# Patient Record
Sex: Male | Born: 1965
Health system: Southern US, Community
[De-identification: ages and names within clinical notes are randomized; demographics above are authoritative.]

## PROBLEM LIST (undated history)

## (undated) DIAGNOSIS — K56609 Unspecified intestinal obstruction, unspecified as to partial versus complete obstruction: Secondary | ICD-10-CM

## (undated) DIAGNOSIS — Z87442 Personal history of urinary calculi: Secondary | ICD-10-CM

## (undated) DIAGNOSIS — K219 Gastro-esophageal reflux disease without esophagitis: Secondary | ICD-10-CM

## (undated) DIAGNOSIS — C801 Malignant (primary) neoplasm, unspecified: Secondary | ICD-10-CM

## (undated) HISTORY — PX: HERNIA REPAIR: SHX51

---

## 2016-02-19 DIAGNOSIS — M5440 Lumbago with sciatica, unspecified side: Secondary | ICD-10-CM | POA: Diagnosis not present

## 2016-02-19 DIAGNOSIS — M545 Low back pain: Secondary | ICD-10-CM | POA: Diagnosis not present

## 2017-04-05 DIAGNOSIS — M25562 Pain in left knee: Secondary | ICD-10-CM | POA: Diagnosis not present

## 2017-04-06 DIAGNOSIS — M25562 Pain in left knee: Secondary | ICD-10-CM | POA: Diagnosis not present

## 2017-04-06 DIAGNOSIS — M25462 Effusion, left knee: Secondary | ICD-10-CM | POA: Diagnosis not present

## 2017-04-19 DIAGNOSIS — Z0000 Encounter for general adult medical examination without abnormal findings: Secondary | ICD-10-CM | POA: Diagnosis not present

## 2017-04-19 DIAGNOSIS — Z6841 Body Mass Index (BMI) 40.0 and over, adult: Secondary | ICD-10-CM | POA: Diagnosis not present

## 2017-04-26 DIAGNOSIS — M25562 Pain in left knee: Secondary | ICD-10-CM | POA: Diagnosis not present

## 2017-05-10 DIAGNOSIS — M25562 Pain in left knee: Secondary | ICD-10-CM | POA: Diagnosis not present

## 2017-05-31 DIAGNOSIS — S83242A Other tear of medial meniscus, current injury, left knee, initial encounter: Secondary | ICD-10-CM | POA: Diagnosis not present

## 2017-06-15 ENCOUNTER — Other Ambulatory Visit: Payer: Self-pay | Admitting: Orthopedic Surgery

## 2017-06-21 ENCOUNTER — Encounter (HOSPITAL_COMMUNITY): Payer: Self-pay

## 2017-06-21 ENCOUNTER — Encounter (HOSPITAL_COMMUNITY)
Admission: RE | Admit: 2017-06-21 | Discharge: 2017-06-21 | Disposition: A | Discharge status: Home / Self Care | Payer: BLUE CROSS/BLUE SHIELD | Source: Ambulatory Visit | Attending: Orthopedic Surgery | Admitting: Orthopedic Surgery

## 2017-06-21 ENCOUNTER — Other Ambulatory Visit: Payer: Self-pay | Admitting: Orthopedic Surgery

## 2017-06-21 DIAGNOSIS — X58XXXA Exposure to other specified factors, initial encounter: Secondary | ICD-10-CM | POA: Insufficient documentation

## 2017-06-21 DIAGNOSIS — S83242A Other tear of medial meniscus, current injury, left knee, initial encounter: Secondary | ICD-10-CM | POA: Insufficient documentation

## 2017-06-21 HISTORY — DX: Unspecified intestinal obstruction, unspecified as to partial versus complete obstruction: K56.609

## 2017-06-21 HISTORY — DX: Malignant (primary) neoplasm, unspecified: C80.1

## 2017-06-21 HISTORY — DX: Personal history of urinary calculi: Z87.442

## 2017-06-21 LAB — CBC
HEMATOCRIT: 48 % (ref 39.0–52.0)
Hemoglobin: 16.6 g/dL (ref 13.0–17.0)
MCH: 32 pg (ref 26.0–34.0)
MCHC: 34.6 g/dL (ref 30.0–36.0)
MCV: 92.5 fL (ref 78.0–100.0)
Platelets: 241 10*3/uL (ref 150–400)
RBC: 5.19 MIL/uL (ref 4.22–5.81)
RDW: 12.9 % (ref 11.5–15.5)
WBC: 7.9 10*3/uL (ref 4.0–10.5)

## 2017-06-21 NOTE — Progress Notes (Addendum)
Mr Alkhatib is allergic to Penicillin, it causes swelling all over.  I called Lacretia Nicks at Dr Berenice Primas office and informed her, she said they will chang the antibiotic. Patient debies chest pain shortness of breath.Patient's PCP is Dr Darrol Jump, [place], [place].

## 2017-06-21 NOTE — Pre-Procedure Instructions (Signed)
Thomas Gonzalez  06/21/2017    Your procedure is scheduled on Wednesday, August 8.  Report to [facility] Admitting at 12:45 PM                 Your surgery or procedure is scheduled for 2:45 PM   Call this number if you have problems the morning of surgery- [phone]- pre op desk                   For any other questions, please call [phone], Monday - Friday 8 AM - 4 PM. Ask to speak to a nurse.    Remember:  Do not eat food or drink liquids after midnight Tuesday, August 7.  Take these medicines the morning of surgery with A SIP OF WATER - meloxicam (MOBIC) .  Special instructions:   - Preparing For Surgery  Before surgery, you can play an important role. Because skin is not sterile, your skin needs to be as free of germs as possible. You can reduce the number of germs on your skin by washing with CHG (chlorahexidine gluconate) Soap before surgery.  CHG is an antiseptic cleaner which kills germs and bonds with the skin to continue killing germs even after washing.  Please do not use if you have an allergy to CHG or antibacterial soaps. If your skin becomes reddened/irritated stop using the CHG.  Do not shave (including legs and underarms) for at least 48 hours prior to first CHG shower. It is OK to shave your face.  Please follow these instructions carefully.   1. Shower the NIGHT BEFORE SURGERY and the MORNING OF SURGERY with CHG.   2. If you chose to wash your hair, wash your hair first as usual with your normal shampoo.  3. After you shampoo, rinse your hair and body thoroughly to remove the shampoo. Wash your face and private area with the soap you use at home, then rinse.  4. Use CHG as you would any other liquid soap. You can apply CHG directly to the skin and wash gently with a scrungie or a clean washcloth.   5. Apply the CHG Soap to your body ONLY FROM THE NECK DOWN.  Do not use on open wounds or open sores. Avoid contact with your eyes,  ears, mouth and genitals (private parts). Wash genitals (private parts) with your normal soap.  6. Wash thoroughly, paying special attention to the area where your surgery will be performed.  7. Thoroughly rinse your body with warm water from the neck down.  8. DO NOT shower/wash with your normal soap after using and rinsing off the CHG Soap.  9. Pat yourself dry with a CLEAN TOWEL.   10. Wear CLEAN PAJAMAS   11. Place CLEAN SHEETS on your bed the night of your first shower and DO NOT SLEEP WITH PETS.  Day of Surgery:Shower as Above Do not apply any deodorants/lotions. Please wear clean clothes to the hospital/surgery center.    Do not wear jewelry, make-up or nail polish.  Do not wear lotions, powders, or perfumes, or deoderant.  Do not shave 48 hours prior to surgery.  Men may shave face and neck.  Do not bring valuables to the hospital.  [facility] is not responsible for any belongings or valuables.  Contacts, dentures or bridgework may not be worn into surgery.  Leave your suitcase in the car.  After surgery it may be brought to your room.  For  patients admitted to the hospital, discharge time will be determined by your treatment team.  Patients discharged the day of surgery will not be allowed to drive home.   Please read over the following fact sheets that you were given: Pain Booklet,  Incentive Spirometry, Surgical Site Infections.

## 2017-06-26 MED ORDER — CLINDAMYCIN PHOSPHATE 900 MG/50ML IV SOLN
900.0000 mg | INTRAVENOUS | Status: DC
  Filled 2017-06-26: qty 50

## 2017-06-27 ENCOUNTER — Ambulatory Visit (HOSPITAL_COMMUNITY): Payer: BLUE CROSS/BLUE SHIELD | Admitting: Certified Registered Nurse Anesthetist

## 2017-06-27 ENCOUNTER — Encounter (HOSPITAL_COMMUNITY)
Admission: RE | Disposition: A | Discharge status: Home / Self Care | Payer: Self-pay | Source: Ambulatory Visit | Attending: Orthopedic Surgery

## 2017-06-27 ENCOUNTER — Ambulatory Visit (HOSPITAL_COMMUNITY)
Admission: RE | Admit: 2017-06-27 | Discharge: 2017-06-27 | Disposition: A | Discharge status: Home / Self Care | Payer: BLUE CROSS/BLUE SHIELD | Source: Ambulatory Visit | Attending: Orthopedic Surgery | Admitting: Orthopedic Surgery

## 2017-06-27 ENCOUNTER — Encounter (HOSPITAL_COMMUNITY): Payer: Self-pay | Admitting: Certified Registered Nurse Anesthetist

## 2017-06-27 DIAGNOSIS — Z880 Allergy status to penicillin: Secondary | ICD-10-CM | POA: Diagnosis not present

## 2017-06-27 DIAGNOSIS — Z87891 Personal history of nicotine dependence: Secondary | ICD-10-CM | POA: Diagnosis not present

## 2017-06-27 DIAGNOSIS — Z87442 Personal history of urinary calculi: Secondary | ICD-10-CM | POA: Insufficient documentation

## 2017-06-27 DIAGNOSIS — M6752 Plica syndrome, left knee: Secondary | ICD-10-CM | POA: Diagnosis not present

## 2017-06-27 DIAGNOSIS — M23322 Other meniscus derangements, posterior horn of medial meniscus, left knee: Secondary | ICD-10-CM | POA: Diagnosis not present

## 2017-06-27 DIAGNOSIS — X58XXXA Exposure to other specified factors, initial encounter: Secondary | ICD-10-CM | POA: Diagnosis not present

## 2017-06-27 DIAGNOSIS — M2242 Chondromalacia patellae, left knee: Secondary | ICD-10-CM | POA: Diagnosis not present

## 2017-06-27 DIAGNOSIS — M94262 Chondromalacia, left knee: Secondary | ICD-10-CM | POA: Insufficient documentation

## 2017-06-27 DIAGNOSIS — S83242A Other tear of medial meniscus, current injury, left knee, initial encounter: Secondary | ICD-10-CM | POA: Insufficient documentation

## 2017-06-27 HISTORY — PX: KNEE ARTHROSCOPY: SHX127

## 2017-06-27 SURGERY — ARTHROSCOPY, KNEE
Anesthesia: General | Site: Knee | Laterality: Left

## 2017-06-27 MED ORDER — LACTATED RINGERS IV SOLN
INTRAVENOUS | Status: DC
  Administered 2017-06-27: 50 mL/h via INTRAVENOUS

## 2017-06-27 MED ORDER — PROPOFOL 10 MG/ML IV BOLUS
INTRAVENOUS | Status: AC
  Filled 2017-06-27: qty 20

## 2017-06-27 MED ORDER — MIDAZOLAM HCL 2 MG/2ML IJ SOLN
INTRAMUSCULAR | Status: AC
  Filled 2017-06-27: qty 2

## 2017-06-27 MED ORDER — DEXAMETHASONE SODIUM PHOSPHATE 10 MG/ML IJ SOLN
INTRAMUSCULAR | Status: AC
  Filled 2017-06-27: qty 1

## 2017-06-27 MED ORDER — SODIUM CHLORIDE 0.9 % IR SOLN
Status: DC | PRN
  Administered 2017-06-27: 3000 mL

## 2017-06-27 MED ORDER — LIDOCAINE 2% (20 MG/ML) 5 ML SYRINGE
INTRAMUSCULAR | Status: AC
  Filled 2017-06-27: qty 5

## 2017-06-27 MED ORDER — BUPIVACAINE HCL (PF) 0.5 % IJ SOLN
INTRAMUSCULAR | Status: AC
  Filled 2017-06-27: qty 30

## 2017-06-27 MED ORDER — BUPIVACAINE HCL (PF) 0.25 % IJ SOLN
INTRAMUSCULAR | Status: DC | PRN
  Administered 2017-06-27: 20 mL

## 2017-06-27 MED ORDER — EPINEPHRINE PF 1 MG/ML IJ SOLN
INTRAMUSCULAR | Status: DC | PRN
  Administered 2017-06-27: 1 mg

## 2017-06-27 MED ORDER — FENTANYL CITRATE (PF) 250 MCG/5ML IJ SOLN
INTRAMUSCULAR | Status: AC
  Filled 2017-06-27: qty 5

## 2017-06-27 MED ORDER — ONDANSETRON HCL 4 MG/2ML IJ SOLN
INTRAMUSCULAR | Status: AC
  Filled 2017-06-27: qty 2

## 2017-06-27 MED ORDER — EPINEPHRINE PF 1 MG/ML IJ SOLN
INTRAMUSCULAR | Status: AC
  Filled 2017-06-27: qty 1

## 2017-06-27 SURGICAL SUPPLY — 40 items
BANDAGE ACE 6X5 VEL STRL LF (GAUZE/BANDAGES/DRESSINGS) ×3 IMPLANT
BLADE CUDA 5.5 (BLADE) IMPLANT
BLADE CUTTER GATOR 3.5 (BLADE) IMPLANT
BLADE GREAT WHITE 4.2 (BLADE) ×2 IMPLANT
BLADE GREAT WHITE 4.2MM (BLADE) ×1
BNDG GAUZE ELAST 4 BULKY (GAUZE/BANDAGES/DRESSINGS) ×3 IMPLANT
COVER SURGICAL LIGHT HANDLE (MISCELLANEOUS) ×3 IMPLANT
CUFF TOURNIQUET SINGLE 34IN LL (TOURNIQUET CUFF) IMPLANT
CUFF TOURNIQUET SINGLE 44IN (TOURNIQUET CUFF) IMPLANT
DRAPE ARTHROSCOPY W/POUCH 114 (DRAPES) ×3 IMPLANT
DRAPE HALF SHEET 40X57 (DRAPES) ×3 IMPLANT
DRAPE U-SHAPE 47X51 STRL (DRAPES) IMPLANT
DRSG EMULSION OIL 3X3 NADH (GAUZE/BANDAGES/DRESSINGS) ×3 IMPLANT
DRSG PAD ABDOMINAL 8X10 ST (GAUZE/BANDAGES/DRESSINGS) ×3 IMPLANT
DURAPREP 26ML APPLICATOR (WOUND CARE) ×3 IMPLANT
FILTER STRAW FLUID ASPIR (MISCELLANEOUS) ×3 IMPLANT
GAUZE SPONGE 4X4 12PLY STRL (GAUZE/BANDAGES/DRESSINGS) ×3 IMPLANT
GLOVE BIOGEL PI IND STRL 8 (GLOVE) ×2 IMPLANT
GLOVE BIOGEL PI INDICATOR 8 (GLOVE) ×4
GLOVE ECLIPSE 7.5 STRL STRAW (GLOVE) ×6 IMPLANT
GOWN STRL REUS W/ TWL LRG LVL3 (GOWN DISPOSABLE) ×1 IMPLANT
GOWN STRL REUS W/ TWL XL LVL3 (GOWN DISPOSABLE) ×2 IMPLANT
GOWN STRL REUS W/TWL LRG LVL3 (GOWN DISPOSABLE) ×2
GOWN STRL REUS W/TWL XL LVL3 (GOWN DISPOSABLE) ×4
KIT BASIN OR (CUSTOM PROCEDURE TRAY) ×3 IMPLANT
KIT ROOM TURNOVER OR (KITS) ×3 IMPLANT
NEEDLE 18GX1X1/2 (RX/OR ONLY) (NEEDLE) ×3 IMPLANT
PACK ARTHROSCOPY DSU (CUSTOM PROCEDURE TRAY) ×3 IMPLANT
PAD ABD 8X10 STRL (GAUZE/BANDAGES/DRESSINGS) ×3 IMPLANT
PAD ARMBOARD 7.5X6 YLW CONV (MISCELLANEOUS) ×6 IMPLANT
PAD CAST 4YDX4 CTTN HI CHSV (CAST SUPPLIES) ×2 IMPLANT
PADDING CAST COTTON 4X4 STRL (CAST SUPPLIES) ×4
SET ARTHROSCOPY TUBING (MISCELLANEOUS) ×2
SET ARTHROSCOPY TUBING LN (MISCELLANEOUS) ×1 IMPLANT
SPONGE LAP 4X18 X RAY DECT (DISPOSABLE) ×3 IMPLANT
SUT ETHILON 4 0 PS 2 18 (SUTURE) ×3 IMPLANT
SYR 5ML LL (SYRINGE) ×3 IMPLANT
TOWEL OR 17X24 6PK STRL BLUE (TOWEL DISPOSABLE) ×3 IMPLANT
TOWEL OR 17X26 10 PK STRL BLUE (TOWEL DISPOSABLE) ×3 IMPLANT
WRAP KNEE MAXI GEL POST OP (GAUZE/BANDAGES/DRESSINGS) ×3 IMPLANT

## 2017-06-27 NOTE — H&P (Signed)
PREOPERATIVE H&P  Chief Complaint: l knee pain  HPI: Thomas Gonzalez is a 51 y.o. male who presents for evaluation of l knee pain. It has been present for greater than 3 months and has been worsening. He has failed conservative measures. Pain is rated as moderate.  Past Medical History:  Diagnosis Date  . Bowel obstruction ([place])    06/21/2017 reported- "years ago"  . Cancer ([place])    basal cell face  . History of kidney stones    x 2   Past Surgical History:  Procedure Laterality Date  . HERNIA REPAIR Right    Inguinial    Social History   Social History  . Marital status: Married    Spouse name: N/A  . Number of children: N/A  . Years of education: N/A   Social History Main Topics  . Smoking status: Former Smoker    Years: 0.20  . Smokeless tobacco: Former Systems developer     Comment: quit in 1985  . Alcohol use No     Comment: in high school- not since  . Drug use: No  . Sexual activity: Not Asked   Other Topics Concern  . None   Social History Narrative  . None   History reviewed. No pertinent family history. Allergies  Allergen Reactions  . Penicillins Anaphylaxis   Prior to Admission medications   Medication Sig Start Date End Date Taking? Authorizing Provider  calcium carbonate (TUMS - DOSED IN MG ELEMENTAL CALCIUM) 500 MG chewable tablet Chew 1 tablet by mouth daily as needed for indigestion or heartburn.   Yes [provider]  meloxicam (MOBIC) 15 MG tablet Take 15 mg by mouth daily as needed for pain.   Yes [provider]     Positive ROS: none  All other systems have been reviewed and were otherwise negative with the exception of those mentioned in the HPI and as above.  Physical Exam: Vitals:   06/27/17 1743 06/27/17 1745  BP: 118/84   Pulse: 61 60  Resp: 11 12  Temp:  (!) 97.2 F (36.2 C)    General: Alert, no acute distress Cardiovascular: No pedal edema Respiratory: No cyanosis, no use of accessory musculature GI: No  organomegaly, abdomen is soft and non-tender Skin: No lesions in the area of chief complaint Neurologic: Sensation intact distally Psychiatric: Patient is competent for consent with normal mood and affect Lymphatic: No axillary or cervical lymphadenopathy  MUSCULOSKELETAL: l knee: painful ROM +ttp over med side trace effusion  Assessment/Plan: LEFT KNEE MEDIAL MENISCUS TEAR Plan for Procedure(s): ARTHROSCOPY KNEE  The risks benefits and alternatives were discussed with the patient including but not limited to the risks of nonoperative treatment, versus surgical intervention including infection, bleeding, nerve injury, malunion, nonunion, hardware prominence, hardware failure, need for hardware removal, blood clots, cardiopulmonary complications, morbidity, mortality, among others, and they were willing to proceed.  Predicted outcome is good, although there will be at least a six to nine month expected recovery.  Alta Corning, MD 06/27/2017 8:59 PM

## 2017-06-27 NOTE — Brief Op Note (Signed)
06/27/2017  9:00 PM  PATIENT:  Elvina Mattes  51 y.o. male  PRE-OPERATIVE DIAGNOSIS:  LEFT KNEE MEDIAL MENISCUS TEAR  POST-OPERATIVE DIAGNOSIS:  LEFT KNEE MEDIAL MENISCUS TEAR  PROCEDURE:  Procedure(s): ARTHROSCOPY KNEE (Left)  SURGEON:  Surgeon(s) and Role:    Dorna Leitz, MD - Primary  PHYSICIAN ASSISTANT:   ASSISTANTS: bethune   ANESTHESIA:   general  EBL:  No intake/output data recorded.  BLOOD ADMINISTERED:none  DRAINS: none   LOCAL MEDICATIONS USED:  MARCAINE     SPECIMEN:  No Specimen  DISPOSITION OF SPECIMEN:  N/A  COUNTS:  YES  TOURNIQUET:  * No tourniquets in log *  DICTATION: .Other Dictation: Dictation Number [id number]  PLAN OF CARE: Discharge to home after PACU  PATIENT DISPOSITION:  PACU - hemodynamically stable.   Delay start of Pharmacological VTE agent (>24hrs) due to surgical blood loss or risk of bleeding: no

## 2017-06-27 NOTE — Progress Notes (Signed)
Orthopedic Tech Progress Note Patient Details:  Thomas Gonzalez 05-07-1966 [id number]  Ortho Devices Type of Ortho Device: Crutches Ortho Device/Splint Location: fitted and supplied pt with crutches for Left knee.  Pt ambulated fair after training.  Left knee.  Ortho Device/Splint Interventions: Adjustment   Kristopher Oppenheim 06/27/2017, 6:44 PM

## 2017-06-27 NOTE — Transfer of Care (Signed)
Immediate Anesthesia Transfer of Care Note  Patient: Thomas Gonzalez  Procedure(s) Performed: Procedure(s): ARTHROSCOPY KNEE (Left)  Patient Location: PACU  Anesthesia Type:General  Level of Consciousness: drowsy  Airway & Oxygen Therapy: Patient Spontanous Breathing  Post-op Assessment: Report given to RN and Post -op Vital signs reviewed and stable  Post vital signs: Reviewed and stable  Last Vitals:  Vitals:   06/27/17 1724 06/27/17 1728  BP:  121/80  Pulse: 66 61  Resp: 18 (!) 9  Temp:      Last Pain:  Vitals:   06/27/17 1715  TempSrc:   PainSc: 0-No pain      Patients Stated Pain Goal: 3 (19/50/93 2671)  Complications: No apparent anesthesia complications

## 2017-06-28 ENCOUNTER — Encounter (HOSPITAL_COMMUNITY): Payer: Self-pay | Admitting: Orthopedic Surgery

## 2017-06-28 NOTE — Op Note (Signed)
NAME:  Dasch, Danel                   ACCOUNT NO.:  MEDICAL RECORD NO.:  [id number]  LOCATION:                                 FACILITY:  PHYSICIAN:  Alta Corning, M.D.        DATE OF BIRTH:  DATE OF PROCEDURE:  06/27/2017 DATE OF DISCHARGE:                              OPERATIVE REPORT   POSTOPERATIVE DIAGNOSIS:  Medial meniscal tear.  POSTOPERATIVE DIAGNOSES: 1. Medial meniscal tear. 2. Chondromalacia of patellofemoral joint. 3. Medial shelf plica.  PROCEDURES: 1. Partial medial meniscectomy. 2. Chondroplasty of patellofemoral joint down to bleeding bone where     necessary. 3. Medial shelf plica excision.  SURGEON:  Alta Corning, MD.  ASSISTANT:  Gary Fleet, PA.  ANESTHESIA:  General.  BRIEF HISTORY:  Mr. Busic is a 51 year old male with a history and significant complaints of left knee pain.  He had been treated conservatively for prolonged period of time and after failure of conservative care, MRI was obtained which showed medial meniscal tear, complex in nature with chondromalacia of the patellofemoral joint.  He was brought to the operating room for evaluation and procedure as needed.  DESCRIPTION OF PROCEDURE:  The patient was taken to the operating room and after adequate anesthesia was obtained with general anesthetic he was placed supine on the operating table.  The left leg was prepped and draped in usual sterile fashion.  Following this, routine arthroscopic examination of the knee revealed there was obvious chondromalacia of the patellofemoral joint.  This was debrided back to a smooth and stable rim and down to bleeding bone where necessary.  Attention was turned to the medial compartment.  Again, in the medial compartment, we had debrided a large medial shelf plica back to the rim.  Once this was done, we got in the medial compartment.  There was a complex posterior horn medial meniscal tear with flaps both anteriorly and posteriorly.  We  were able to identify and debride the flaps in the anterior and posterior direction.  Once this was done, the knee was irrigated, suctioned dry and a sterile compressive dressing was applied.  After 20 mL 0.25% Marcaine was instilled for postoperative anesthesia, the patient was taken to the recovery room and was noted to be in satisfactory condition.  Estimated blood loss for the procedure was minimal.     Alta Corning, M.D.     Corliss Skains  D:  06/27/2017  T:  06/27/2017  Job:  [id number]

## 2017-07-03 DIAGNOSIS — Z9889 Other specified postprocedural states: Secondary | ICD-10-CM | POA: Diagnosis not present

## 2017-07-04 DIAGNOSIS — Z4789 Encounter for other orthopedic aftercare: Secondary | ICD-10-CM | POA: Diagnosis not present

## 2017-07-04 DIAGNOSIS — M25562 Pain in left knee: Secondary | ICD-10-CM | POA: Diagnosis not present

## 2017-07-05 DIAGNOSIS — Z4789 Encounter for other orthopedic aftercare: Secondary | ICD-10-CM | POA: Diagnosis not present

## 2017-07-05 DIAGNOSIS — M25562 Pain in left knee: Secondary | ICD-10-CM | POA: Diagnosis not present

## 2017-07-10 DIAGNOSIS — M25562 Pain in left knee: Secondary | ICD-10-CM | POA: Diagnosis not present

## 2017-07-10 DIAGNOSIS — Z4789 Encounter for other orthopedic aftercare: Secondary | ICD-10-CM | POA: Diagnosis not present

## 2017-07-12 DIAGNOSIS — M25562 Pain in left knee: Secondary | ICD-10-CM | POA: Diagnosis not present

## 2017-07-12 DIAGNOSIS — Z4789 Encounter for other orthopedic aftercare: Secondary | ICD-10-CM | POA: Diagnosis not present

## 2017-07-17 DIAGNOSIS — Z4789 Encounter for other orthopedic aftercare: Secondary | ICD-10-CM | POA: Diagnosis not present

## 2017-07-17 DIAGNOSIS — M25562 Pain in left knee: Secondary | ICD-10-CM | POA: Diagnosis not present

## 2017-07-19 DIAGNOSIS — M25562 Pain in left knee: Secondary | ICD-10-CM | POA: Diagnosis not present

## 2017-07-19 DIAGNOSIS — Z4789 Encounter for other orthopedic aftercare: Secondary | ICD-10-CM | POA: Diagnosis not present

## 2017-07-24 DIAGNOSIS — M25562 Pain in left knee: Secondary | ICD-10-CM | POA: Diagnosis not present

## 2017-07-24 DIAGNOSIS — Z4789 Encounter for other orthopedic aftercare: Secondary | ICD-10-CM | POA: Diagnosis not present

## 2017-07-26 DIAGNOSIS — Z4789 Encounter for other orthopedic aftercare: Secondary | ICD-10-CM | POA: Diagnosis not present

## 2017-07-26 DIAGNOSIS — M25562 Pain in left knee: Secondary | ICD-10-CM | POA: Diagnosis not present

## 2017-08-07 DIAGNOSIS — M25562 Pain in left knee: Secondary | ICD-10-CM | POA: Diagnosis not present

## 2017-08-30 DIAGNOSIS — Z0000 Encounter for general adult medical examination without abnormal findings: Secondary | ICD-10-CM | POA: Diagnosis not present

## 2017-10-21 DIAGNOSIS — R0789 Other chest pain: Secondary | ICD-10-CM | POA: Diagnosis not present

## 2017-10-21 DIAGNOSIS — R079 Chest pain, unspecified: Secondary | ICD-10-CM | POA: Diagnosis not present

## 2017-10-21 DIAGNOSIS — R05 Cough: Secondary | ICD-10-CM | POA: Diagnosis not present

## 2017-10-21 DIAGNOSIS — J208 Acute bronchitis due to other specified organisms: Secondary | ICD-10-CM | POA: Diagnosis not present

## 2017-10-23 DIAGNOSIS — J180 Bronchopneumonia, unspecified organism: Secondary | ICD-10-CM | POA: Diagnosis not present

## 2017-11-20 HISTORY — PX: APPENDECTOMY: SHX54

## 2017-12-14 DIAGNOSIS — R109 Unspecified abdominal pain: Secondary | ICD-10-CM | POA: Diagnosis not present

## 2017-12-14 DIAGNOSIS — K3580 Unspecified acute appendicitis: Secondary | ICD-10-CM | POA: Diagnosis not present

## 2017-12-14 DIAGNOSIS — R1031 Right lower quadrant pain: Secondary | ICD-10-CM | POA: Diagnosis not present

## 2017-12-14 DIAGNOSIS — Z87891 Personal history of nicotine dependence: Secondary | ICD-10-CM | POA: Diagnosis not present

## 2017-12-14 DIAGNOSIS — K37 Unspecified appendicitis: Secondary | ICD-10-CM | POA: Diagnosis not present

## 2017-12-14 DIAGNOSIS — Z880 Allergy status to penicillin: Secondary | ICD-10-CM | POA: Diagnosis not present

## 2017-12-14 DIAGNOSIS — R10813 Right lower quadrant abdominal tenderness: Secondary | ICD-10-CM | POA: Diagnosis not present

## 2017-12-21 DIAGNOSIS — Z09 Encounter for follow-up examination after completed treatment for conditions other than malignant neoplasm: Secondary | ICD-10-CM | POA: Diagnosis not present

## 2018-08-24 DIAGNOSIS — N132 Hydronephrosis with renal and ureteral calculous obstruction: Secondary | ICD-10-CM | POA: Diagnosis not present

## 2018-08-24 DIAGNOSIS — N201 Calculus of ureter: Secondary | ICD-10-CM | POA: Diagnosis not present

## 2018-08-24 DIAGNOSIS — Z87442 Personal history of urinary calculi: Secondary | ICD-10-CM | POA: Diagnosis not present

## 2018-08-24 DIAGNOSIS — R1030 Lower abdominal pain, unspecified: Secondary | ICD-10-CM | POA: Diagnosis not present

## 2018-09-12 DIAGNOSIS — J0100 Acute maxillary sinusitis, unspecified: Secondary | ICD-10-CM | POA: Diagnosis not present

## 2018-09-12 DIAGNOSIS — J09X2 Influenza due to identified novel influenza A virus with other respiratory manifestations: Secondary | ICD-10-CM | POA: Diagnosis not present

## 2019-02-25 DIAGNOSIS — K4091 Unilateral inguinal hernia, without obstruction or gangrene, recurrent: Secondary | ICD-10-CM | POA: Diagnosis not present

## 2019-08-21 DIAGNOSIS — K4091 Unilateral inguinal hernia, without obstruction or gangrene, recurrent: Secondary | ICD-10-CM | POA: Diagnosis not present

## 2019-08-21 DIAGNOSIS — M25512 Pain in left shoulder: Secondary | ICD-10-CM | POA: Diagnosis not present

## 2019-09-05 DIAGNOSIS — K4090 Unilateral inguinal hernia, without obstruction or gangrene, not specified as recurrent: Secondary | ICD-10-CM | POA: Diagnosis not present

## 2019-10-02 DIAGNOSIS — E669 Obesity, unspecified: Secondary | ICD-10-CM | POA: Diagnosis not present

## 2019-10-02 DIAGNOSIS — Z6832 Body mass index (BMI) 32.0-32.9, adult: Secondary | ICD-10-CM | POA: Diagnosis not present

## 2019-10-02 DIAGNOSIS — Z85828 Personal history of other malignant neoplasm of skin: Secondary | ICD-10-CM | POA: Diagnosis not present

## 2019-10-02 DIAGNOSIS — M1990 Unspecified osteoarthritis, unspecified site: Secondary | ICD-10-CM | POA: Diagnosis not present

## 2019-10-02 DIAGNOSIS — K219 Gastro-esophageal reflux disease without esophagitis: Secondary | ICD-10-CM | POA: Diagnosis not present

## 2019-10-02 DIAGNOSIS — Z87891 Personal history of nicotine dependence: Secondary | ICD-10-CM | POA: Diagnosis not present

## 2019-10-02 DIAGNOSIS — K4090 Unilateral inguinal hernia, without obstruction or gangrene, not specified as recurrent: Secondary | ICD-10-CM | POA: Diagnosis not present

## 2019-10-10 DIAGNOSIS — Z09 Encounter for follow-up examination after completed treatment for conditions other than malignant neoplasm: Secondary | ICD-10-CM | POA: Diagnosis not present

## 2019-10-17 DIAGNOSIS — M79604 Pain in right leg: Secondary | ICD-10-CM | POA: Diagnosis not present

## 2019-10-17 DIAGNOSIS — M5430 Sciatica, unspecified side: Secondary | ICD-10-CM | POA: Diagnosis not present

## 2019-10-18 DIAGNOSIS — M1611 Unilateral primary osteoarthritis, right hip: Secondary | ICD-10-CM | POA: Diagnosis not present

## 2019-10-18 DIAGNOSIS — N200 Calculus of kidney: Secondary | ICD-10-CM | POA: Diagnosis not present

## 2019-10-18 DIAGNOSIS — M545 Low back pain: Secondary | ICD-10-CM | POA: Diagnosis not present

## 2019-10-18 DIAGNOSIS — M25551 Pain in right hip: Secondary | ICD-10-CM | POA: Diagnosis not present

## 2019-12-07 DIAGNOSIS — J9811 Atelectasis: Secondary | ICD-10-CM | POA: Diagnosis not present

## 2019-12-07 DIAGNOSIS — U071 COVID-19: Secondary | ICD-10-CM | POA: Diagnosis not present

## 2019-12-07 DIAGNOSIS — R509 Fever, unspecified: Secondary | ICD-10-CM | POA: Diagnosis not present

## 2019-12-07 DIAGNOSIS — R05 Cough: Secondary | ICD-10-CM | POA: Diagnosis not present

## 2020-02-06 ENCOUNTER — Encounter: Payer: Self-pay | Admitting: Legal Medicine

## 2020-02-06 ENCOUNTER — Other Ambulatory Visit: Payer: Self-pay

## 2020-02-06 ENCOUNTER — Ambulatory Visit: Payer: BC Managed Care – PPO | Admitting: Legal Medicine

## 2020-02-06 VITALS — BP 126/84 | HR 104 | Temp 98.6°F | Resp 16 | Ht 69.0 in | Wt 216.8 lb

## 2020-02-06 DIAGNOSIS — K921 Melena: Secondary | ICD-10-CM | POA: Diagnosis not present

## 2020-02-06 LAB — CBC WITH DIFFERENTIAL/PLATELET
HCT: 36 (ref 29–41)
Hemoglobin: 12.6
Platelet: 318
Red Blood Cell Count: 3.77
WBC: 12

## 2020-02-06 LAB — COMPREHENSIVE METABOLIC PANEL
ALT: 16 (ref 3–30)
AST: 27
BUN: 33
Creatinine, Ser: 0.9
Glucose, Bld: 115 — AB (ref 70–99)
Potassium: 4.2
Sodium: 139

## 2020-02-06 MED ORDER — TRAMADOL HCL 50 MG PO TABS: 50.0000 mg | ORAL_TABLET | Freq: Three times a day (TID) | ORAL | 0 refills | Status: AC | PRN

## 2020-02-06 NOTE — Progress Notes (Signed)
Established Patient Office Visit  Subjective:  Patient ID: Thomas Gonzalez, male    DOB: [birth year]  Age: 54 y.o. MRN: [id number]  CC:  Chief Complaint  Patient presents with  . Melena    HPI XAN INGRAHAM presents for melena for 2 days associated with abdominal pain.  Upset stomach.  No history of GI bleed.  Using BC powders. Some fatigue and sleeping a lot. He is weak but no dyspnea.  He tells me he had a narcotic problem in past.  We discussed poosible admission.  Past Medical History:  Diagnosis Date  . Bowel obstruction ([place])    06/21/2017 reported- "years ago"  . Cancer ([place])    basal cell face  . History of kidney stones    x 2    Past Surgical History:  Procedure Laterality Date  . HERNIA REPAIR Right    Inguinial   . KNEE ARTHROSCOPY Left 06/27/2017   Procedure: ARTHROSCOPY KNEE;  Surgeon: Dorna Leitz, MD;  Location: [facility];  Service: Orthopedics;  Laterality: Left;    Family History  Family history unknown: Yes    Social History   Socioeconomic History  . Marital status: Married    Spouse name: Not on file  . Number of children: 2  . Years of education: Not on file  . Highest education level: Not on file  Occupational History    Employer: [employer]  Tobacco Use  . Smoking status: Former Smoker    Years: 0.20  . Smokeless tobacco: Former Systems developer  . Tobacco comment: quit in 1985  Substance and Sexual Activity  . Alcohol use: No    Comment: in high school- not since  . Drug use: No  . Sexual activity: Not on file  Other Topics Concern  . Not on file  Social History Narrative  . Not on file   Social Determinants of Health   Financial Resource Strain:   . Difficulty of Paying Living Expenses:   Food Insecurity:   . Worried About Charity fundraiser in the Last Year:   . Arboriculturist in the Last Year:   Transportation Needs:   . Film/video editor (Medical):   Marland Kitchen Lack of Transportation (Non-Medical):   Physical Activity:     . Days of Exercise per Week:   . Minutes of Exercise per Session:   Stress:   . Feeling of Stress :   Social Connections:   . Frequency of Communication with Friends and Family:   . Frequency of Social Gatherings with Friends and Family:   . Attends Religious Services:   . Active Member of Clubs or Organizations:   . Attends Archivist Meetings:   Marland Kitchen Marital Status:   Intimate Partner Violence:   . Fear of Current or Ex-Partner:   . Emotionally Abused:   Marland Kitchen Physically Abused:   . Sexually Abused:     Outpatient Medications Prior to Visit  Medication Sig Dispense Refill  . calcium carbonate (TUMS - DOSED IN MG ELEMENTAL CALCIUM) 500 MG chewable tablet Chew 1 tablet by mouth daily as needed for indigestion or heartburn.    . lidocaine (LIDODERM) 5 % USE 1 PATCH EXTERNALLY ONCE DAILY AS NEEDED FOR PAIN. APPLY TOPICALLY TO INTACT SKIN OVER AREA OF GREATEST PAIN. YOU MAY CUT TO SIZE MAXIMUM    . meloxicam (MOBIC) 15 MG tablet Take 15 mg by mouth daily as needed for pain.     No facility-administered medications  prior to visit.    Allergies  Allergen Reactions  . Penicillins Anaphylaxis    ROS Review of Systems  Constitutional: Negative.   HENT: Negative.   Eyes: Negative.   Respiratory: Negative.   Cardiovascular: Negative.   Gastrointestinal: Positive for abdominal pain.  Endocrine: Negative.   Genitourinary: Negative.   Musculoskeletal: Negative.   Neurological: Negative.   Psychiatric/Behavioral: Negative.       Objective:    Physical Exam  Constitutional: He appears well-developed and well-nourished.  HENT:  Head: Normocephalic and atraumatic.  Pale conjunctiva  Eyes: Pupils are equal, round, and reactive to light. Conjunctivae and EOM are normal.  Cardiovascular: Normal rate, regular rhythm and normal heart sounds.  Pulmonary/Chest: Effort normal and breath sounds normal.  Abdominal: Soft. Bowel sounds are normal. There is abdominal tenderness.   epigastric  Musculoskeletal:     Cervical back: Normal range of motion and neck supple.  Vitals reviewed.   BP 126/84   Pulse (!) 104   Temp 98.6 F (37 C)   Resp 16   Ht 5' 9"  (1.753 m)   Wt 216 lb 12.8 oz (98.3 kg)   SpO2 98%   BMI 32.02 kg/m  Wt Readings from Last 3 Encounters:  02/06/20 216 lb 12.8 oz (98.3 kg)  06/27/17 206 lb (93.4 kg)  06/21/17 206 lb 1.6 oz (93.5 kg)     Health Maintenance Due  Topic Date Due  . Hepatitis C Screening  Never done  . HIV Screening  Never done  . TETANUS/TDAP  Never done  . COLONOSCOPY  Never done  . INFLUENZA VACCINE  Never done    There are no preventive care reminders to display for this patient.  No results found for: TSH Lab Results  Component Value Date   WBC 7.9 06/21/2017   HGB 16.6 06/21/2017   HCT 48.0 06/21/2017   MCV 92.5 06/21/2017   PLT 241 06/21/2017   No results found for: NA, K, CHLORIDE, CO2, GLUCOSE, BUN, CREATININE, BILITOT, ALKPHOS, AST, ALT, PROT, ALBUMIN, CALCIUM, ANIONGAP, EGFR, GFR No results found for: CHOL No results found for: HDL No results found for: LDLCALC No results found for: TRIG No results found for: CHOLHDL No results found for: HGBA1C    Assessment & Plan:   Problem List Items Addressed This Visit      Digestive   Melena - Primary    Patient has had melanotic stool for 3 days.  He has pale conjunctive , He is on BC and meloxicam.  We discussed workup and possible transfusion and admission.  We will get stat CBC and CMP at hospital.  Start dexalant 3m samles # 15.  Follow up Monday.  I gave 10 tramadol if he really needs it for pain over weekend.  He will try not to take them. HCT from [facility].      Relevant Orders   CBC with Differential   Comprehensive metabolic panel      Meds ordered this encounter  Medications  . traMADol (ULTRAM) 50 MG tablet    Sig: Take 1 tablet (50 mg total) by mouth every 8 (eight) hours as needed for up to 10 days.     Dispense:  15 tablet    Refill:  0    Follow-up: Return in about 4 days (around 02/10/2020).    LReinaldo Meeker MD

## 2020-02-06 NOTE — Patient Instructions (Signed)
Rectal Bleeding  Rectal bleeding is when blood comes out of the opening of the butt (anus). People with this kind of bleeding may notice bright red blood in their underwear or in the toilet after they poop (have a bowel movement). They may also have dark red or black poop (stool). Rectal bleeding is often a sign that something is wrong. It needs to be checked by a doctor. Follow these instructions at home: Watch for any changes in your condition. Take these actions to help with bleeding and discomfort:  Eat a diet that is high in fiber. This will keep your poop soft so it is easier for you to poop without pushing too hard. Ask your doctor to tell you what foods and drinks are high in fiber.  Drink enough fluid to keep your pee (urine) clear or pale yellow. This also helps keep your poop soft.  Try taking a warm bath. This may help with pain.  Keep all follow-up visits as told by your doctor. This is important. Get help right away if:  You have new bleeding.  You have more bleeding than before.  You have black or dark red poop.  You throw up (vomit) blood or something that looks like coffee grounds.  You have pain or tenderness in your belly (abdomen).  You have a fever.  You feel weak.  You feel sick to your stomach (nauseous).  You pass out (faint).  You have very bad pain in your butt.  You cannot poop. This information is not intended to replace advice given to you by your health care provider. Make sure you discuss any questions you have with your health care provider. Document Revised: 10/19/2017 Document Reviewed: 01/02/2016 Elsevier Patient Education  2020 Elsevier Inc.  

## 2020-02-06 NOTE — Assessment & Plan Note (Addendum)
Patient has had melanotic stool for 3 days.  He has pale conjunctive , He is on BC and meloxicam.  We discussed workup and possible transfusion and admission.  We will get stat CBC and CMP at hospital.  Start dexalant 60mg  samles # 15.  Follow up Monday.  I gave 10 tramadol if he really needs it for pain over weekend.  He will try not to take them. HCT from [place] was 36.

## 2020-02-09 ENCOUNTER — Ambulatory Visit: Payer: BC Managed Care – PPO | Admitting: Legal Medicine

## 2020-02-09 ENCOUNTER — Other Ambulatory Visit: Payer: Self-pay

## 2020-02-09 ENCOUNTER — Encounter: Payer: Self-pay | Admitting: Legal Medicine

## 2020-02-09 VITALS — BP 132/78 | HR 80 | Temp 97.9°F | Resp 16 | Ht 69.0 in | Wt 226.0 lb

## 2020-02-09 DIAGNOSIS — K921 Melena: Secondary | ICD-10-CM

## 2020-02-09 LAB — CBC WITH DIFFERENTIAL/PLATELET
Basophils Absolute: 0.1 10*3/uL (ref 0.0–0.2)
Basos: 1 %
EOS (ABSOLUTE): 0.2 10*3/uL (ref 0.0–0.4)
Eos: 3 %
Hematocrit: 29.6 % — ABNORMAL LOW (ref 37.5–51.0)
Hemoglobin: 9.9 g/dL — ABNORMAL LOW (ref 13.0–17.7)
Immature Grans (Abs): 0 10*3/uL (ref 0.0–0.1)
Immature Granulocytes: 0 %
Lymphocytes Absolute: 2 10*3/uL (ref 0.7–3.1)
Lymphs: 23 %
MCH: 32.7 pg (ref 26.6–33.0)
MCHC: 33.4 g/dL (ref 31.5–35.7)
MCV: 98 fL — ABNORMAL HIGH (ref 79–97)
Monocytes Absolute: 1 10*3/uL — ABNORMAL HIGH (ref 0.1–0.9)
Monocytes: 12 %
Neutrophils Absolute: 5.5 10*3/uL (ref 1.4–7.0)
Neutrophils: 61 %
Platelets: 293 10*3/uL (ref 150–450)
RBC: 3.03 x10E6/uL — ABNORMAL LOW (ref 4.14–5.80)
RDW: 13.2 % (ref 11.6–15.4)
WBC: 8.8 10*3/uL (ref 3.4–10.8)

## 2020-02-09 NOTE — Progress Notes (Signed)
Established Patient Office Visit  Subjective:  Patient ID: Thomas Gonzalez, male    DOB: 1966/05/02  Age: 54 y.o. MRN: [id number]  CC:  Chief Complaint  Patient presents with  . Melena    HPI TZVI ECONOMOU presents for melena for 2 days associated with abdominal pain.  Upset stomach.  No history of GI bleed.  Using BC powders. Some fatigue and sleeping a lot. He is weak but no dyspnea.  02/09/2020 Patient is feeling better and having less melena.  Less abdominal pain .  Last HCT 36 on Friday.  Continue Dexalant and refer to Dr.Meisenheimer.  He tells me he had a narcotic problem in past.  We discussed poosible admission.  Past Medical History:  Diagnosis Date  . Bowel obstruction ([place])    06/21/2017 reported- "years ago"  . Cancer ([place])    basal cell face  . History of kidney stones    x 2    Past Surgical History:  Procedure Laterality Date  . HERNIA REPAIR Right    Inguinial   . KNEE ARTHROSCOPY Left 06/27/2017   Procedure: ARTHROSCOPY KNEE;  Surgeon: Dorna Leitz, MD;  Location: [facility];  Service: Orthopedics;  Laterality: Left;    Family History  Family history unknown: Yes    Social History   Socioeconomic History  . Marital status: Married    Spouse name: Not on file  . Number of children: 2  . Years of education: Not on file  . Highest education level: Not on file  Occupational History    Employer: [employer]  Tobacco Use  . Smoking status: Former Smoker    Years: 0.20  . Smokeless tobacco: Former Systems developer  . Tobacco comment: quit in 1985  Substance and Sexual Activity  . Alcohol use: No    Comment: in high school- not since  . Drug use: No  . Sexual activity: Not on file  Other Topics Concern  . Not on file  Social History Narrative  . Not on file   Social Determinants of Health   Financial Resource Strain:   . Difficulty of Paying Living Expenses:   Food Insecurity:   . Worried About Charity fundraiser in the Last Year:   . Arts development officer in the Last Year:   Transportation Needs:   . Film/video editor (Medical):   Marland Kitchen Lack of Transportation (Non-Medical):   Physical Activity:   . Days of Exercise per Week:   . Minutes of Exercise per Session:   Stress:   . Feeling of Stress :   Social Connections:   . Frequency of Communication with Friends and Family:   . Frequency of Social Gatherings with Friends and Family:   . Attends Religious Services:   . Active Member of Clubs or Organizations:   . Attends Archivist Meetings:   Marland Kitchen Marital Status:   Intimate Partner Violence:   . Fear of Current or Ex-Partner:   . Emotionally Abused:   Marland Kitchen Physically Abused:   . Sexually Abused:     Outpatient Medications Prior to Visit  Medication Sig Dispense Refill  . calcium carbonate (TUMS - DOSED IN MG ELEMENTAL CALCIUM) 500 MG chewable tablet Chew 1 tablet by mouth daily as needed for indigestion or heartburn.    . lidocaine (LIDODERM) 5 % USE 1 PATCH EXTERNALLY ONCE DAILY AS NEEDED FOR PAIN. APPLY TOPICALLY TO INTACT SKIN OVER AREA OF GREATEST PAIN. YOU MAY CUT TO SIZE  MAXIMUM    . meloxicam (MOBIC) 15 MG tablet Take 15 mg by mouth daily as needed for pain.    . traMADol (ULTRAM) 50 MG tablet Take 1 tablet (50 mg total) by mouth every 8 (eight) hours as needed for up to 10 days. 15 tablet 0   No facility-administered medications prior to visit.    Allergies  Allergen Reactions  . Penicillins Anaphylaxis    ROS Review of Systems  Constitutional: Negative.   HENT: Negative.   Eyes: Negative.   Respiratory: Negative.   Cardiovascular: Negative.   Gastrointestinal: Negative for abdominal pain.  Endocrine: Negative.   Genitourinary: Negative.   Musculoskeletal: Negative.   Neurological: Negative.   Psychiatric/Behavioral: Negative.       Objective:    Physical Exam  Constitutional: He appears well-developed and well-nourished.  HENT:  Head: Normocephalic and atraumatic.  Pale conjunctiva    Eyes: Pupils are equal, round, and reactive to light. Conjunctivae and EOM are normal.  Cardiovascular: Normal rate, regular rhythm and normal heart sounds.  Pulmonary/Chest: Effort normal and breath sounds normal.  Abdominal: Soft. Bowel sounds are normal. There is abdominal tenderness.  epigastric  Musculoskeletal:     Cervical back: Normal range of motion and neck supple.  Vitals reviewed.   BP 132/78   Pulse 80   Temp 97.9 F (36.6 C)   Resp 16   Ht 5' 9"  (1.753 m)   Wt 226 lb (102.5 kg)   BMI 33.37 kg/m  Wt Readings from Last 3 Encounters:  02/09/20 226 lb (102.5 kg)  02/06/20 216 lb 12.8 oz (98.3 kg)  06/27/17 206 lb (93.4 kg)     Health Maintenance Due  Topic Date Due  . Hepatitis C Screening  Never done  . HIV Screening  Never done  . TETANUS/TDAP  Never done  . COLONOSCOPY  Never done  . INFLUENZA VACCINE  Never done    There are no preventive care reminders to display for this patient.  No results found for: TSH Lab Results  Component Value Date   WBC 7.9 06/21/2017   HGB 16.6 06/21/2017   HCT 48.0 06/21/2017   MCV 92.5 06/21/2017   PLT 241 06/21/2017   No results found for: NA, K, CHLORIDE, CO2, GLUCOSE, BUN, CREATININE, BILITOT, ALKPHOS, AST, ALT, PROT, ALBUMIN, CALCIUM, ANIONGAP, EGFR, GFR No results found for: CHOL No results found for: HDL No results found for: LDLCALC No results found for: TRIG No results found for: CHOLHDL No results found for: HGBA1C    Assessment & Plan:   Problem List Items Addressed This Visit      Digestive   Melena - Primary    Melena slowing down,  He is eating and does not have abdominal pain.  He needs repeat CBC and referral to Dr. Odie Sera for EGD. He remains on Dexalant 72m,      Relevant Orders   Ambulatory referral to Gastroenterology   CBC with Differential      No orders of the defined types were placed in this encounter.   Follow-up: Return in about 1 month (around 03/11/2020).     LReinaldo Meeker MD

## 2020-02-09 NOTE — Assessment & Plan Note (Signed)
Melena slowing down,  He is eating and does not have abdominal pain.  He needs repeat CBC and referral to Dr. Odie Sera for EGD. He remains on Dexalant 60mg ,

## 2020-02-10 NOTE — Progress Notes (Signed)
Hemoglobin and hematocrit are lower after rehydration 9.9 and 29.6.  recheck in one week lp

## 2020-02-16 ENCOUNTER — Other Ambulatory Visit: Payer: BC Managed Care – PPO

## 2020-02-16 DIAGNOSIS — K921 Melena: Secondary | ICD-10-CM

## 2020-02-16 LAB — CBC WITH DIFFERENTIAL/PLATELET
Basophils Absolute: 0 10*3/uL (ref 0.0–0.2)
Basos: 1 %
EOS (ABSOLUTE): 0.3 10*3/uL (ref 0.0–0.4)
Eos: 4 %
Hematocrit: 30 % — ABNORMAL LOW (ref 37.5–51.0)
Hemoglobin: 10 g/dL — ABNORMAL LOW (ref 13.0–17.7)
Immature Grans (Abs): 0 10*3/uL (ref 0.0–0.1)
Immature Granulocytes: 0 %
Lymphocytes Absolute: 1.7 10*3/uL (ref 0.7–3.1)
Lymphs: 21 %
MCH: 31.2 pg (ref 26.6–33.0)
MCHC: 33.3 g/dL (ref 31.5–35.7)
MCV: 94 fL (ref 79–97)
Monocytes Absolute: 1 10*3/uL — ABNORMAL HIGH (ref 0.1–0.9)
Monocytes: 12 %
Neutrophils Absolute: 5 10*3/uL (ref 1.4–7.0)
Neutrophils: 62 %
Platelets: 407 10*3/uL (ref 150–450)
RBC: 3.21 x10E6/uL — ABNORMAL LOW (ref 4.14–5.80)
RDW: 13.6 % (ref 11.6–15.4)
WBC: 8 10*3/uL (ref 3.4–10.8)

## 2020-02-16 NOTE — Progress Notes (Signed)
Patient's labs drawn by [facility].

## 2020-02-17 NOTE — Progress Notes (Signed)
Chronic anemia is stable slightly improving, no further bleeding. lp

## 2020-03-11 ENCOUNTER — Other Ambulatory Visit: Payer: Self-pay

## 2020-03-11 ENCOUNTER — Encounter: Payer: Self-pay | Admitting: Legal Medicine

## 2020-03-11 ENCOUNTER — Ambulatory Visit: Payer: BC Managed Care – PPO | Admitting: Legal Medicine

## 2020-03-11 DIAGNOSIS — K921 Melena: Secondary | ICD-10-CM

## 2020-03-11 NOTE — Assessment & Plan Note (Signed)
No further melanotic stool and he has run out of dexilant and stopped BC and Nsaids.  He is to see Dr. Odie Sera in may.

## 2020-03-11 NOTE — Progress Notes (Signed)
Established Patient Office Visit  Subjective:  Patient ID: Thomas Gonzalez, male    DOB: August 09, 1966  Age: 54 y.o. MRN: [id number]  CC:  Chief Complaint  Patient presents with  . Melena    HPI NATTHAN MCLAURY presents for follow up for melena.  He stopped BC and is on dexilant and has had no melanotic stool since last visit.  Past Medical History:  Diagnosis Date  . Bowel obstruction ([place])    06/21/2017 reported- "years ago"  . Cancer ([place])    basal cell face  . History of kidney stones    x 2    Past Surgical History:  Procedure Laterality Date  . HERNIA REPAIR Right    Inguinial   . KNEE ARTHROSCOPY Left 06/27/2017   Procedure: ARTHROSCOPY KNEE;  Surgeon: Dorna Leitz, MD;  Location: [facility];  Service: Orthopedics;  Laterality: Left;    Family History  Family history unknown: Yes    Social History   Socioeconomic History  . Marital status: Married    Spouse name: Not on file  . Number of children: 2  . Years of education: Not on file  . Highest education level: Not on file  Occupational History    Employer: [employer]  Tobacco Use  . Smoking status: Former Smoker    Years: 0.20  . Smokeless tobacco: Former Systems developer  . Tobacco comment: quit in 1985  Substance and Sexual Activity  . Alcohol use: No    Comment: in high school- not since  . Drug use: No  . Sexual activity: Not on file  Other Topics Concern  . Not on file  Social History Narrative  . Not on file   Social Determinants of Health   Financial Resource Strain:   . Difficulty of Paying Living Expenses:   Food Insecurity:   . Worried About Charity fundraiser in the Last Year:   . Arboriculturist in the Last Year:   Transportation Needs:   . Film/video editor (Medical):   Marland Kitchen Lack of Transportation (Non-Medical):   Physical Activity:   . Days of Exercise per Week:   . Minutes of Exercise per Session:   Stress:   . Feeling of Stress :   Social Connections:   . Frequency of  Communication with Friends and Family:   . Frequency of Social Gatherings with Friends and Family:   . Attends Religious Services:   . Active Member of Clubs or Organizations:   . Attends Archivist Meetings:   Marland Kitchen Marital Status:   Intimate Partner Violence:   . Fear of Current or Ex-Partner:   . Emotionally Abused:   Marland Kitchen Physically Abused:   . Sexually Abused:     Outpatient Medications Prior to Visit  Medication Sig Dispense Refill  . calcium carbonate (TUMS - DOSED IN MG ELEMENTAL CALCIUM) 500 MG chewable tablet Chew 1 tablet by mouth daily as needed for indigestion or heartburn.    . Dexlansoprazole (DEXILANT PO) Take by mouth.    . lidocaine (LIDODERM) 5 % USE 1 PATCH EXTERNALLY ONCE DAILY AS NEEDED FOR PAIN. APPLY TOPICALLY TO INTACT SKIN OVER AREA OF GREATEST PAIN. YOU MAY CUT TO SIZE MAXIMUM    . meloxicam (MOBIC) 15 MG tablet Take 15 mg by mouth daily as needed for pain.     No facility-administered medications prior to visit.    Allergies  Allergen Reactions  . Penicillins Anaphylaxis    ROS Review  of Systems  Constitutional: Negative.   HENT: Negative.   Eyes: Negative.   Cardiovascular: Negative.   Gastrointestinal: Negative.   Endocrine: Negative.   Genitourinary: Negative.   Musculoskeletal: Negative.   Neurological: Negative.       Objective:    Physical Exam  Constitutional: He appears well-developed and well-nourished.  HENT:  Head: Normocephalic and atraumatic.  Cardiovascular: Normal rate, regular rhythm, normal heart sounds and intact distal pulses.  Pulmonary/Chest: Effort normal and breath sounds normal.  Vitals reviewed.   BP 122/76   Pulse 77   Temp 98 F (36.7 C)   Resp 18   Ht 5\' 9"  (1.753 m)   Wt 220 lb 6.4 oz (100 kg)   SpO2 99%   BMI 32.55 kg/m  Wt Readings from Last 3 Encounters:  03/11/20 220 lb 6.4 oz (100 kg)  02/09/20 226 lb (102.5 kg)  02/06/20 216 lb 12.8 oz (98.3 kg)     Health Maintenance Due  Topic  Date Due  . Hepatitis C Screening  Never done  . HIV Screening  Never done  . COVID-19 Vaccine (1) Never done  . TETANUS/TDAP  Never done  . COLONOSCOPY  Never done    There are no preventive care reminders to display for this patient.  No results found for: TSH Lab Results  Component Value Date   WBC 8.0 02/16/2020   HGB 10.0 (L) 02/16/2020   HCT 30.0 (L) 02/16/2020   MCV 94 02/16/2020   PLT 407 02/16/2020   Lab Results  Component Value Date   NA 139 02/06/2020   K 4.2 02/06/2020   GLUCOSE 115 (A) 02/06/2020   BUN 33 02/06/2020   CREATININE 0.90 02/06/2020   AST 27 02/06/2020   ALT 16 02/06/2020   No results found for: CHOL No results found for: HDL No results found for: LDLCALC No results found for: TRIG No results found for: CHOLHDL No results found for: HGBA1C    Assessment & Plan:   Problem List Items Addressed This Visit      Digestive   Melena    No further melanotic stool and he has run out of dexilant and stopped BC and Nsaids.  He is to see Dr. Odie Sera in may.         No orders of the defined types were placed in this encounter.  Return if symptoms worsen or fail to improve. Follow-up: Return if symptoms worsen or fail to improve.    [name], MD

## 2020-05-10 DIAGNOSIS — R1013 Epigastric pain: Secondary | ICD-10-CM | POA: Diagnosis not present

## 2020-05-10 DIAGNOSIS — D649 Anemia, unspecified: Secondary | ICD-10-CM | POA: Diagnosis not present

## 2020-05-11 DIAGNOSIS — Z1159 Encounter for screening for other viral diseases: Secondary | ICD-10-CM | POA: Diagnosis not present

## 2020-05-11 DIAGNOSIS — Z1152 Encounter for screening for COVID-19: Secondary | ICD-10-CM | POA: Diagnosis not present

## 2020-05-18 DIAGNOSIS — K921 Melena: Secondary | ICD-10-CM | POA: Diagnosis not present

## 2020-05-18 DIAGNOSIS — K5730 Diverticulosis of large intestine without perforation or abscess without bleeding: Secondary | ICD-10-CM | POA: Diagnosis not present

## 2020-05-18 DIAGNOSIS — C159 Malignant neoplasm of esophagus, unspecified: Secondary | ICD-10-CM | POA: Diagnosis not present

## 2020-05-18 DIAGNOSIS — K229 Disease of esophagus, unspecified: Secondary | ICD-10-CM | POA: Diagnosis not present

## 2020-05-18 DIAGNOSIS — C155 Malignant neoplasm of lower third of esophagus: Secondary | ICD-10-CM | POA: Diagnosis not present

## 2020-05-18 DIAGNOSIS — Z1211 Encounter for screening for malignant neoplasm of colon: Secondary | ICD-10-CM | POA: Diagnosis not present

## 2020-05-18 DIAGNOSIS — K222 Esophageal obstruction: Secondary | ICD-10-CM | POA: Diagnosis not present

## 2020-05-21 DIAGNOSIS — C159 Malignant neoplasm of esophagus, unspecified: Secondary | ICD-10-CM | POA: Diagnosis not present

## 2020-05-21 DIAGNOSIS — Z8501 Personal history of malignant neoplasm of esophagus: Secondary | ICD-10-CM | POA: Diagnosis not present

## 2020-05-21 DIAGNOSIS — I700 Atherosclerosis of aorta: Secondary | ICD-10-CM | POA: Diagnosis not present

## 2020-05-21 DIAGNOSIS — K228 Other specified diseases of esophagus: Secondary | ICD-10-CM | POA: Diagnosis not present

## 2020-05-21 DIAGNOSIS — R590 Localized enlarged lymph nodes: Secondary | ICD-10-CM | POA: Diagnosis not present

## 2020-05-21 DIAGNOSIS — R918 Other nonspecific abnormal finding of lung field: Secondary | ICD-10-CM | POA: Diagnosis not present

## 2020-05-27 DIAGNOSIS — Z79899 Other long term (current) drug therapy: Secondary | ICD-10-CM | POA: Diagnosis not present

## 2020-05-27 DIAGNOSIS — K219 Gastro-esophageal reflux disease without esophagitis: Secondary | ICD-10-CM | POA: Diagnosis not present

## 2020-05-27 DIAGNOSIS — C155 Malignant neoplasm of lower third of esophagus: Secondary | ICD-10-CM | POA: Diagnosis not present

## 2020-06-02 DIAGNOSIS — I2510 Atherosclerotic heart disease of native coronary artery without angina pectoris: Secondary | ICD-10-CM | POA: Diagnosis not present

## 2020-06-02 DIAGNOSIS — C159 Malignant neoplasm of esophagus, unspecified: Secondary | ICD-10-CM | POA: Diagnosis not present

## 2020-06-02 DIAGNOSIS — N200 Calculus of kidney: Secondary | ICD-10-CM | POA: Diagnosis not present

## 2020-06-02 DIAGNOSIS — N400 Enlarged prostate without lower urinary tract symptoms: Secondary | ICD-10-CM | POA: Diagnosis not present

## 2020-06-02 DIAGNOSIS — J320 Chronic maxillary sinusitis: Secondary | ICD-10-CM | POA: Diagnosis not present

## 2020-06-02 DIAGNOSIS — R911 Solitary pulmonary nodule: Secondary | ICD-10-CM | POA: Diagnosis not present

## 2020-06-02 DIAGNOSIS — I700 Atherosclerosis of aorta: Secondary | ICD-10-CM | POA: Diagnosis not present

## 2020-06-03 DIAGNOSIS — C155 Malignant neoplasm of lower third of esophagus: Secondary | ICD-10-CM | POA: Diagnosis not present

## 2020-06-03 DIAGNOSIS — C160 Malignant neoplasm of cardia: Secondary | ICD-10-CM | POA: Diagnosis not present

## 2020-06-08 DIAGNOSIS — C160 Malignant neoplasm of cardia: Secondary | ICD-10-CM | POA: Diagnosis not present

## 2020-06-10 DIAGNOSIS — Z4682 Encounter for fitting and adjustment of non-vascular catheter: Secondary | ICD-10-CM | POA: Diagnosis not present

## 2020-06-10 DIAGNOSIS — C160 Malignant neoplasm of cardia: Secondary | ICD-10-CM | POA: Diagnosis not present

## 2020-06-10 DIAGNOSIS — N200 Calculus of kidney: Secondary | ICD-10-CM | POA: Diagnosis not present

## 2020-06-10 DIAGNOSIS — E669 Obesity, unspecified: Secondary | ICD-10-CM | POA: Diagnosis not present

## 2020-06-10 DIAGNOSIS — C4491 Basal cell carcinoma of skin, unspecified: Secondary | ICD-10-CM | POA: Diagnosis not present

## 2020-06-10 DIAGNOSIS — Z452 Encounter for adjustment and management of vascular access device: Secondary | ICD-10-CM | POA: Diagnosis not present

## 2020-06-10 DIAGNOSIS — K219 Gastro-esophageal reflux disease without esophagitis: Secondary | ICD-10-CM | POA: Diagnosis not present

## 2020-06-11 DIAGNOSIS — Z510 Encounter for antineoplastic radiation therapy: Secondary | ICD-10-CM | POA: Diagnosis not present

## 2020-06-11 DIAGNOSIS — C155 Malignant neoplasm of lower third of esophagus: Secondary | ICD-10-CM | POA: Diagnosis not present

## 2020-06-15 DIAGNOSIS — C155 Malignant neoplasm of lower third of esophagus: Secondary | ICD-10-CM | POA: Diagnosis not present

## 2020-06-15 DIAGNOSIS — Z5111 Encounter for antineoplastic chemotherapy: Secondary | ICD-10-CM | POA: Diagnosis not present

## 2020-06-16 DIAGNOSIS — C155 Malignant neoplasm of lower third of esophagus: Secondary | ICD-10-CM | POA: Diagnosis not present

## 2020-06-16 DIAGNOSIS — Z09 Encounter for follow-up examination after completed treatment for conditions other than malignant neoplasm: Secondary | ICD-10-CM | POA: Diagnosis not present

## 2020-06-16 DIAGNOSIS — Z510 Encounter for antineoplastic radiation therapy: Secondary | ICD-10-CM | POA: Diagnosis not present

## 2020-06-17 DIAGNOSIS — Z510 Encounter for antineoplastic radiation therapy: Secondary | ICD-10-CM | POA: Diagnosis not present

## 2020-06-17 DIAGNOSIS — C155 Malignant neoplasm of lower third of esophagus: Secondary | ICD-10-CM | POA: Diagnosis not present

## 2020-06-18 DIAGNOSIS — Z510 Encounter for antineoplastic radiation therapy: Secondary | ICD-10-CM | POA: Diagnosis not present

## 2020-06-18 DIAGNOSIS — C155 Malignant neoplasm of lower third of esophagus: Secondary | ICD-10-CM | POA: Diagnosis not present

## 2020-06-21 DIAGNOSIS — C155 Malignant neoplasm of lower third of esophagus: Secondary | ICD-10-CM | POA: Diagnosis not present

## 2020-06-21 DIAGNOSIS — Z510 Encounter for antineoplastic radiation therapy: Secondary | ICD-10-CM | POA: Diagnosis not present

## 2020-06-22 DIAGNOSIS — C155 Malignant neoplasm of lower third of esophagus: Secondary | ICD-10-CM | POA: Diagnosis not present

## 2020-06-22 DIAGNOSIS — C154 Malignant neoplasm of middle third of esophagus: Secondary | ICD-10-CM | POA: Diagnosis not present

## 2020-06-22 DIAGNOSIS — C160 Malignant neoplasm of cardia: Secondary | ICD-10-CM | POA: Diagnosis not present

## 2020-06-23 DIAGNOSIS — Z510 Encounter for antineoplastic radiation therapy: Secondary | ICD-10-CM | POA: Diagnosis not present

## 2020-06-23 DIAGNOSIS — C155 Malignant neoplasm of lower third of esophagus: Secondary | ICD-10-CM | POA: Diagnosis not present

## 2020-06-24 DIAGNOSIS — Z510 Encounter for antineoplastic radiation therapy: Secondary | ICD-10-CM | POA: Diagnosis not present

## 2020-06-24 DIAGNOSIS — C155 Malignant neoplasm of lower third of esophagus: Secondary | ICD-10-CM | POA: Diagnosis not present

## 2020-06-25 DIAGNOSIS — C155 Malignant neoplasm of lower third of esophagus: Secondary | ICD-10-CM | POA: Diagnosis not present

## 2020-06-25 DIAGNOSIS — Z510 Encounter for antineoplastic radiation therapy: Secondary | ICD-10-CM | POA: Diagnosis not present

## 2020-06-28 DIAGNOSIS — Z510 Encounter for antineoplastic radiation therapy: Secondary | ICD-10-CM | POA: Diagnosis not present

## 2020-06-28 DIAGNOSIS — C155 Malignant neoplasm of lower third of esophagus: Secondary | ICD-10-CM | POA: Diagnosis not present

## 2020-06-29 DIAGNOSIS — C155 Malignant neoplasm of lower third of esophagus: Secondary | ICD-10-CM | POA: Diagnosis not present

## 2020-06-29 DIAGNOSIS — C154 Malignant neoplasm of middle third of esophagus: Secondary | ICD-10-CM | POA: Diagnosis not present

## 2020-06-29 DIAGNOSIS — C160 Malignant neoplasm of cardia: Secondary | ICD-10-CM | POA: Diagnosis not present

## 2020-06-30 DIAGNOSIS — C155 Malignant neoplasm of lower third of esophagus: Secondary | ICD-10-CM | POA: Diagnosis not present

## 2020-06-30 DIAGNOSIS — C154 Malignant neoplasm of middle third of esophagus: Secondary | ICD-10-CM | POA: Diagnosis not present

## 2020-06-30 DIAGNOSIS — C160 Malignant neoplasm of cardia: Secondary | ICD-10-CM | POA: Diagnosis not present

## 2020-06-30 DIAGNOSIS — Z510 Encounter for antineoplastic radiation therapy: Secondary | ICD-10-CM | POA: Diagnosis not present

## 2020-07-01 DIAGNOSIS — C154 Malignant neoplasm of middle third of esophagus: Secondary | ICD-10-CM | POA: Diagnosis not present

## 2020-07-01 DIAGNOSIS — C155 Malignant neoplasm of lower third of esophagus: Secondary | ICD-10-CM | POA: Diagnosis not present

## 2020-07-01 DIAGNOSIS — C160 Malignant neoplasm of cardia: Secondary | ICD-10-CM | POA: Diagnosis not present

## 2020-07-01 DIAGNOSIS — Z510 Encounter for antineoplastic radiation therapy: Secondary | ICD-10-CM | POA: Diagnosis not present

## 2020-07-02 DIAGNOSIS — C160 Malignant neoplasm of cardia: Secondary | ICD-10-CM | POA: Diagnosis not present

## 2020-07-02 DIAGNOSIS — C154 Malignant neoplasm of middle third of esophagus: Secondary | ICD-10-CM | POA: Diagnosis not present

## 2020-07-02 DIAGNOSIS — C155 Malignant neoplasm of lower third of esophagus: Secondary | ICD-10-CM | POA: Diagnosis not present

## 2020-07-02 DIAGNOSIS — Z510 Encounter for antineoplastic radiation therapy: Secondary | ICD-10-CM | POA: Diagnosis not present

## 2020-07-03 DIAGNOSIS — Z510 Encounter for antineoplastic radiation therapy: Secondary | ICD-10-CM | POA: Diagnosis not present

## 2020-07-03 DIAGNOSIS — C155 Malignant neoplasm of lower third of esophagus: Secondary | ICD-10-CM | POA: Diagnosis not present

## 2020-07-05 DIAGNOSIS — C154 Malignant neoplasm of middle third of esophagus: Secondary | ICD-10-CM | POA: Diagnosis not present

## 2020-07-05 DIAGNOSIS — C160 Malignant neoplasm of cardia: Secondary | ICD-10-CM | POA: Diagnosis not present

## 2020-07-05 DIAGNOSIS — C155 Malignant neoplasm of lower third of esophagus: Secondary | ICD-10-CM | POA: Diagnosis not present

## 2020-07-06 ENCOUNTER — Encounter: Payer: Self-pay | Admitting: Legal Medicine

## 2020-07-06 ENCOUNTER — Ambulatory Visit (INDEPENDENT_AMBULATORY_CARE_PROVIDER_SITE_OTHER): Payer: BC Managed Care – PPO | Admitting: Legal Medicine

## 2020-07-06 ENCOUNTER — Other Ambulatory Visit: Payer: Self-pay

## 2020-07-06 VITALS — BP 132/80 | HR 82 | Temp 97.6°F | Resp 18 | Ht 69.0 in | Wt 190.8 lb

## 2020-07-06 DIAGNOSIS — M5431 Sciatica, right side: Secondary | ICD-10-CM | POA: Diagnosis not present

## 2020-07-06 DIAGNOSIS — C154 Malignant neoplasm of middle third of esophagus: Secondary | ICD-10-CM | POA: Diagnosis not present

## 2020-07-06 DIAGNOSIS — C771 Secondary and unspecified malignant neoplasm of intrathoracic lymph nodes: Secondary | ICD-10-CM | POA: Diagnosis not present

## 2020-07-06 DIAGNOSIS — C159 Malignant neoplasm of esophagus, unspecified: Secondary | ICD-10-CM | POA: Diagnosis not present

## 2020-07-06 DIAGNOSIS — C160 Malignant neoplasm of cardia: Secondary | ICD-10-CM | POA: Diagnosis not present

## 2020-07-06 DIAGNOSIS — Z510 Encounter for antineoplastic radiation therapy: Secondary | ICD-10-CM | POA: Diagnosis not present

## 2020-07-06 DIAGNOSIS — C155 Malignant neoplasm of lower third of esophagus: Secondary | ICD-10-CM | POA: Diagnosis not present

## 2020-07-06 MED ORDER — TRIAMCINOLONE ACETONIDE 40 MG/ML IJ SUSP
80.0000 mg | Freq: Once | INTRAMUSCULAR | Status: AC
  Administered 2020-07-06: 80 mg via INTRAMUSCULAR

## 2020-07-06 MED ORDER — PREDNISONE 10 MG (21) PO TBPK: ORAL_TABLET | ORAL | 0 refills | Status: DC

## 2020-07-06 MED ORDER — OXYCODONE-ACETAMINOPHEN 10-325 MG PO TABS: 1.0000 | ORAL_TABLET | Freq: Three times a day (TID) | ORAL | 0 refills | Status: AC | PRN

## 2020-07-06 NOTE — Progress Notes (Signed)
Subjective:  Patient ID: Thomas Gonzalez, male    DOB: 1965/12/04  Age: 54 y.o. MRN: [id number]  Chief Complaint  Patient presents with  . Hip Pain    Right side    HPI: right hip and leg pain for 2 weeks, worse when walking.  He has had sciatica in past.  He has newly diagnosed esophageal adenocarcinoma lower and mid esophagus on FOLFOX chemotherapy.  He is under going radiation.  Current Outpatient Medications on File Prior to Visit  Medication Sig Dispense Refill  . calcium carbonate (TUMS - DOSED IN MG ELEMENTAL CALCIUM) 500 MG chewable tablet Chew 1 tablet by mouth daily as needed for indigestion or heartburn.    . lidocaine (LIDODERM) 5 % USE 1 PATCH EXTERNALLY ONCE DAILY AS NEEDED FOR PAIN. APPLY TOPICALLY TO INTACT SKIN OVER AREA OF GREATEST PAIN. YOU MAY CUT TO SIZE MAXIMUM    . meloxicam (MOBIC) 15 MG tablet Take 15 mg by mouth daily as needed for pain.    Marland Kitchen omeprazole (PRILOSEC) 20 MG capsule Take 20 mg by mouth every morning.    . ondansetron (ZOFRAN) 4 MG tablet Take 4 mg by mouth every 4 (four) hours as needed.    Marland Kitchen oxyCODONE (OXY IR/ROXICODONE) 5 MG immediate release tablet Take 5 mg by mouth every 4 (four) hours as needed.    . prochlorperazine (COMPAZINE) 10 MG tablet Take 10 mg by mouth every 6 (six) hours as needed.     No current facility-administered medications on file prior to visit.   Past Medical History:  Diagnosis Date  . Bowel obstruction ([place])    06/21/2017 reported- "years ago"  . Cancer ([place])    basal cell face  . History of kidney stones    x 2   Past Surgical History:  Procedure Laterality Date  . HERNIA REPAIR Right    Inguinial   . KNEE ARTHROSCOPY Left 06/27/2017   Procedure: ARTHROSCOPY KNEE;  Surgeon: Dorna Leitz, MD;  Location: [facility];  Service: Orthopedics;  Laterality: Left;    Family History  Family history unknown: Yes   Social History   Socioeconomic History  . Marital status: Married    Spouse name: Not on file  . Number of  children: 2  . Years of education: Not on file  . Highest education level: Not on file  Occupational History    Employer: [employer]  Tobacco Use  . Smoking status: Former Smoker    Years: 0.20  . Smokeless tobacco: Former Systems developer  . Tobacco comment: quit in 1985  Vaping Use  . Vaping Use: Never used  Substance and Sexual Activity  . Alcohol use: No    Comment: in high school- not since  . Drug use: No  . Sexual activity: Not on file  Other Topics Concern  . Not on file  Social History Narrative  . Not on file   Social Determinants of Health   Financial Resource Strain:   . Difficulty of Paying Living Expenses:   Food Insecurity:   . Worried About Charity fundraiser in the Last Year:   . Arboriculturist in the Last Year:   Transportation Needs:   . Film/video editor (Medical):   Marland Kitchen Lack of Transportation (Non-Medical):   Physical Activity:   . Days of Exercise per Week:   . Minutes of Exercise per Session:   Stress:   . Feeling of Stress :   Social Connections:   .  Frequency of Communication with Friends and Family:   . Frequency of Social Gatherings with Friends and Family:   . Attends Religious Services:   . Active Member of Clubs or Organizations:   . Attends Archivist Meetings:   Marland Kitchen Marital Status:     Review of Systems  Constitutional: Negative.   HENT: Negative.   Respiratory: Negative.   Cardiovascular: Negative.   Genitourinary: Negative.   Musculoskeletal: Positive for back pain.  Neurological: Negative.   Psychiatric/Behavioral: Negative.      Objective:  BP 132/80   Pulse 82   Temp 97.6 F (36.4 C)   Resp 18   Ht 5\' 9"  (1.753 m)   Wt 190 lb 12.8 oz (86.5 kg)   SpO2 98%   BMI 28.18 kg/m   BP/Weight 07/06/2020 03/11/2020 2/80/0349  Systolic BP 179 150 569  Diastolic BP 80 76 78  Wt. (Lbs) 190.8 220.4 226  BMI 28.18 32.55 33.37    Physical Exam Musculoskeletal:     Lumbar back: Spasms and tenderness present.  Positive right straight leg raise test. Negative left straight leg raise test.       Back:     Comments: Negative PET for bony mets     Diabetic Foot Exam - Simple   No data filed       Lab Results  Component Value Date   WBC 8.0 02/16/2020   HGB 10.0 (L) 02/16/2020   HCT 30.0 (L) 02/16/2020   PLT 407 02/16/2020   GLUCOSE 115 (A) 02/06/2020   ALT 16 02/06/2020   AST 27 02/06/2020   NA 139 02/06/2020   K 4.2 02/06/2020   CREATININE 0.90 02/06/2020   BUN 33 02/06/2020      Assessment & Plan:   There are no diagnoses linked to this encounter.   No orders of the defined types were placed in this encounter.   No orders of the defined types were placed in this encounter.    Follow-up: No follow-ups on file.  An After Visit Summary was printed and given to the patient.  [place] [phone]

## 2020-07-07 DIAGNOSIS — Z510 Encounter for antineoplastic radiation therapy: Secondary | ICD-10-CM | POA: Diagnosis not present

## 2020-07-07 DIAGNOSIS — C155 Malignant neoplasm of lower third of esophagus: Secondary | ICD-10-CM | POA: Diagnosis not present

## 2020-07-08 DIAGNOSIS — Z510 Encounter for antineoplastic radiation therapy: Secondary | ICD-10-CM | POA: Diagnosis not present

## 2020-07-08 DIAGNOSIS — C155 Malignant neoplasm of lower third of esophagus: Secondary | ICD-10-CM | POA: Diagnosis not present

## 2020-07-08 DIAGNOSIS — C160 Malignant neoplasm of cardia: Secondary | ICD-10-CM | POA: Diagnosis not present

## 2020-07-08 DIAGNOSIS — C154 Malignant neoplasm of middle third of esophagus: Secondary | ICD-10-CM | POA: Diagnosis not present

## 2020-07-09 DIAGNOSIS — C160 Malignant neoplasm of cardia: Secondary | ICD-10-CM | POA: Diagnosis not present

## 2020-07-09 DIAGNOSIS — Z510 Encounter for antineoplastic radiation therapy: Secondary | ICD-10-CM | POA: Diagnosis not present

## 2020-07-09 DIAGNOSIS — C154 Malignant neoplasm of middle third of esophagus: Secondary | ICD-10-CM | POA: Diagnosis not present

## 2020-07-09 DIAGNOSIS — C155 Malignant neoplasm of lower third of esophagus: Secondary | ICD-10-CM | POA: Diagnosis not present

## 2020-07-12 DIAGNOSIS — C160 Malignant neoplasm of cardia: Secondary | ICD-10-CM | POA: Diagnosis not present

## 2020-07-12 DIAGNOSIS — C155 Malignant neoplasm of lower third of esophagus: Secondary | ICD-10-CM | POA: Diagnosis not present

## 2020-07-12 DIAGNOSIS — C154 Malignant neoplasm of middle third of esophagus: Secondary | ICD-10-CM | POA: Diagnosis not present

## 2020-07-12 DIAGNOSIS — Z510 Encounter for antineoplastic radiation therapy: Secondary | ICD-10-CM | POA: Diagnosis not present

## 2020-07-13 DIAGNOSIS — C160 Malignant neoplasm of cardia: Secondary | ICD-10-CM | POA: Diagnosis not present

## 2020-07-13 DIAGNOSIS — C155 Malignant neoplasm of lower third of esophagus: Secondary | ICD-10-CM | POA: Diagnosis not present

## 2020-07-13 DIAGNOSIS — C154 Malignant neoplasm of middle third of esophagus: Secondary | ICD-10-CM | POA: Diagnosis not present

## 2020-07-14 ENCOUNTER — Encounter: Payer: Self-pay | Admitting: Legal Medicine

## 2020-07-14 ENCOUNTER — Ambulatory Visit (INDEPENDENT_AMBULATORY_CARE_PROVIDER_SITE_OTHER): Payer: BC Managed Care – PPO | Admitting: Legal Medicine

## 2020-07-14 ENCOUNTER — Other Ambulatory Visit: Payer: Self-pay

## 2020-07-14 DIAGNOSIS — M5431 Sciatica, right side: Secondary | ICD-10-CM | POA: Diagnosis not present

## 2020-07-14 DIAGNOSIS — C250 Malignant neoplasm of head of pancreas: Secondary | ICD-10-CM | POA: Diagnosis not present

## 2020-07-14 DIAGNOSIS — M5430 Sciatica, unspecified side: Secondary | ICD-10-CM | POA: Insufficient documentation

## 2020-07-14 DIAGNOSIS — Z510 Encounter for antineoplastic radiation therapy: Secondary | ICD-10-CM | POA: Diagnosis not present

## 2020-07-14 DIAGNOSIS — C155 Malignant neoplasm of lower third of esophagus: Secondary | ICD-10-CM | POA: Diagnosis not present

## 2020-07-14 NOTE — Progress Notes (Signed)
Subjective:  Patient ID: Thomas Gonzalez, male    DOB: 09-22-1966  Age: 53 y.o. MRN: [id number]  Chief Complaint  Patient presents with  . Sciatica    HPI: low back pain, sciatica improving. He got overstimulated and had anger outburst no further prednisone. Hydrocodone caused nausea.  He is functional improvement and minor pain now.  Current Outpatient Medications on File Prior to Visit  Medication Sig Dispense Refill  . calcium carbonate (TUMS - DOSED IN MG ELEMENTAL CALCIUM) 500 MG chewable tablet Chew 1 tablet by mouth daily as needed for indigestion or heartburn.    . lidocaine (LIDODERM) 5 % USE 1 PATCH EXTERNALLY ONCE DAILY AS NEEDED FOR PAIN. APPLY TOPICALLY TO INTACT SKIN OVER AREA OF GREATEST PAIN. YOU MAY CUT TO SIZE MAXIMUM    . meloxicam (MOBIC) 15 MG tablet Take 15 mg by mouth daily as needed for pain.    Marland Kitchen omeprazole (PRILOSEC) 20 MG capsule Take 20 mg by mouth every morning.    Marland Kitchen omeprazole (PRILOSEC) 40 MG capsule Take 40 mg by mouth every morning.    . ondansetron (ZOFRAN) 4 MG tablet Take 4 mg by mouth every 4 (four) hours as needed.    Marland Kitchen oxyCODONE (OXY IR/ROXICODONE) 5 MG immediate release tablet Take 5 mg by mouth every 4 (four) hours as needed.    Marland Kitchen oxyCODONE-acetaminophen (PERCOCET) 10-325 MG tablet Take 1 tablet by mouth every 8 (eight) hours as needed for pain (cancer patient). 90 tablet 0  . predniSONE (STERAPRED UNI-PAK 21 TAB) 10 MG (21) TBPK tablet Take 6ills first day , then 5 pills day 2 and then cut down one pill day until gone 21 tablet 0  . prochlorperazine (COMPAZINE) 10 MG tablet Take 10 mg by mouth every 6 (six) hours as needed.     No current facility-administered medications on file prior to visit.   Past Medical History:  Diagnosis Date  . Bowel obstruction ([place])    06/21/2017 reported- "years ago"  . Cancer ([place])    basal cell face  . History of kidney stones    x 2   Past Surgical History:  Procedure Laterality Date  . HERNIA REPAIR Right     Inguinial   . KNEE ARTHROSCOPY Left 06/27/2017   Procedure: ARTHROSCOPY KNEE;  Surgeon: Dorna Leitz, MD;  Location: [facility];  Service: Orthopedics;  Laterality: Left;    Family History  Family history unknown: Yes   Social History   Socioeconomic History  . Marital status: Married    Spouse name: Not on file  . Number of children: 2  . Years of education: Not on file  . Highest education level: Not on file  Occupational History    Employer: [employer]  Tobacco Use  . Smoking status: Former Smoker    Years: 0.20  . Smokeless tobacco: Former Systems developer  . Tobacco comment: quit in 1985  Vaping Use  . Vaping Use: Never used  Substance and Sexual Activity  . Alcohol use: No    Comment: in high school- not since  . Drug use: No  . Sexual activity: Not on file  Other Topics Concern  . Not on file  Social History Narrative  . Not on file   Social Determinants of Health   Financial Resource Strain:   . Difficulty of Paying Living Expenses: Not on file  Food Insecurity:   . Worried About Charity fundraiser in the Last Year: Not on file  .  Ran Out of Food in the Last Year: Not on file  Transportation Needs:   . Lack of Transportation (Medical): Not on file  . Lack of Transportation (Non-Medical): Not on file  Physical Activity:   . Days of Exercise per Week: Not on file  . Minutes of Exercise per Session: Not on file  Stress:   . Feeling of Stress : Not on file  Social Connections:   . Frequency of Communication with Friends and Family: Not on file  . Frequency of Social Gatherings with Friends and Family: Not on file  . Attends Religious Services: Not on file  . Active Member of Clubs or Organizations: Not on file  . Attends Archivist Meetings: Not on file  . Marital Status: Not on file    Review of Systems  Constitutional: Negative.   HENT: Negative.   Eyes: Negative.   Respiratory: Negative.   Cardiovascular: Negative.   Gastrointestinal:  Negative.   Genitourinary: Negative.   Musculoskeletal: Positive for back pain.  Neurological: Negative.   Hematological: Negative.   Psychiatric/Behavioral: Negative.      Objective:  BP 110/78   Pulse 63   Temp 97.7 F (36.5 C)   Resp 16   Ht 5\' 9"  (1.753 m)   Wt 195 lb 9.6 oz (88.7 kg)   SpO2 99%   BMI 28.89 kg/m   BP/Weight 07/14/2020 07/06/2020 6/57/8469  Systolic BP 629 528 413  Diastolic BP 78 80 76  Wt. (Lbs) 195.6 190.8 220.4  BMI 28.89 28.18 32.55    Physical Exam Vitals reviewed.  Constitutional:      Appearance: Normal appearance.  Cardiovascular:     Rate and Rhythm: Normal rate and regular rhythm.     Pulses: Normal pulses.     Heart sounds: Normal heart sounds.  Pulmonary:     Effort: Pulmonary effort is normal.     Breath sounds: Normal breath sounds.  Musculoskeletal:     Comments: Full ROM back, no tenderness or spasms       Lab Results  Component Value Date   WBC 8.0 02/16/2020   HGB 10.0 (L) 02/16/2020   HCT 30.0 (L) 02/16/2020   PLT 407 02/16/2020   GLUCOSE 115 (A) 02/06/2020   ALT 16 02/06/2020   AST 27 02/06/2020   NA 139 02/06/2020   K 4.2 02/06/2020   CREATININE 0.90 02/06/2020   BUN 33 02/06/2020      Assessment & Plan:   1. Sciatica of right side  The back pain has resolved and he is back at baseline.  Continue exercises and present activity and return is any problems.       Follow-up: Return if symptoms worsen or fail to improve.  An After Visit Summary was printed and given to the patient.  [place] [phone]

## 2020-07-15 DIAGNOSIS — C155 Malignant neoplasm of lower third of esophagus: Secondary | ICD-10-CM | POA: Diagnosis not present

## 2020-07-15 DIAGNOSIS — Z452 Encounter for adjustment and management of vascular access device: Secondary | ICD-10-CM | POA: Diagnosis not present

## 2020-07-15 DIAGNOSIS — C154 Malignant neoplasm of middle third of esophagus: Secondary | ICD-10-CM | POA: Diagnosis not present

## 2020-07-15 DIAGNOSIS — Z510 Encounter for antineoplastic radiation therapy: Secondary | ICD-10-CM | POA: Diagnosis not present

## 2020-07-15 DIAGNOSIS — C160 Malignant neoplasm of cardia: Secondary | ICD-10-CM | POA: Diagnosis not present

## 2020-07-16 DIAGNOSIS — C160 Malignant neoplasm of cardia: Secondary | ICD-10-CM | POA: Diagnosis not present

## 2020-07-16 DIAGNOSIS — C154 Malignant neoplasm of middle third of esophagus: Secondary | ICD-10-CM | POA: Diagnosis not present

## 2020-07-16 DIAGNOSIS — Z510 Encounter for antineoplastic radiation therapy: Secondary | ICD-10-CM | POA: Diagnosis not present

## 2020-07-16 DIAGNOSIS — C155 Malignant neoplasm of lower third of esophagus: Secondary | ICD-10-CM | POA: Diagnosis not present

## 2020-07-19 DIAGNOSIS — C160 Malignant neoplasm of cardia: Secondary | ICD-10-CM | POA: Diagnosis not present

## 2020-07-19 DIAGNOSIS — C154 Malignant neoplasm of middle third of esophagus: Secondary | ICD-10-CM | POA: Diagnosis not present

## 2020-07-19 DIAGNOSIS — C155 Malignant neoplasm of lower third of esophagus: Secondary | ICD-10-CM | POA: Diagnosis not present

## 2020-07-19 DIAGNOSIS — Z510 Encounter for antineoplastic radiation therapy: Secondary | ICD-10-CM | POA: Diagnosis not present

## 2020-07-20 DIAGNOSIS — Z510 Encounter for antineoplastic radiation therapy: Secondary | ICD-10-CM | POA: Diagnosis not present

## 2020-07-20 DIAGNOSIS — C160 Malignant neoplasm of cardia: Secondary | ICD-10-CM | POA: Diagnosis not present

## 2020-07-20 DIAGNOSIS — C154 Malignant neoplasm of middle third of esophagus: Secondary | ICD-10-CM | POA: Diagnosis not present

## 2020-07-20 DIAGNOSIS — C155 Malignant neoplasm of lower third of esophagus: Secondary | ICD-10-CM | POA: Diagnosis not present

## 2020-07-21 DIAGNOSIS — C155 Malignant neoplasm of lower third of esophagus: Secondary | ICD-10-CM | POA: Diagnosis not present

## 2020-07-21 DIAGNOSIS — Z510 Encounter for antineoplastic radiation therapy: Secondary | ICD-10-CM | POA: Diagnosis not present

## 2020-07-22 DIAGNOSIS — Z510 Encounter for antineoplastic radiation therapy: Secondary | ICD-10-CM | POA: Diagnosis not present

## 2020-07-22 DIAGNOSIS — C155 Malignant neoplasm of lower third of esophagus: Secondary | ICD-10-CM | POA: Diagnosis not present

## 2020-07-23 DIAGNOSIS — C155 Malignant neoplasm of lower third of esophagus: Secondary | ICD-10-CM | POA: Diagnosis not present

## 2020-07-23 DIAGNOSIS — Z510 Encounter for antineoplastic radiation therapy: Secondary | ICD-10-CM | POA: Diagnosis not present

## 2020-07-27 DIAGNOSIS — C155 Malignant neoplasm of lower third of esophagus: Secondary | ICD-10-CM | POA: Diagnosis not present

## 2020-07-27 DIAGNOSIS — Z510 Encounter for antineoplastic radiation therapy: Secondary | ICD-10-CM | POA: Diagnosis not present

## 2020-08-25 DIAGNOSIS — R590 Localized enlarged lymph nodes: Secondary | ICD-10-CM | POA: Diagnosis not present

## 2020-08-25 DIAGNOSIS — C159 Malignant neoplasm of esophagus, unspecified: Secondary | ICD-10-CM | POA: Diagnosis not present

## 2020-08-25 DIAGNOSIS — I700 Atherosclerosis of aorta: Secondary | ICD-10-CM | POA: Diagnosis not present

## 2020-08-25 DIAGNOSIS — R911 Solitary pulmonary nodule: Secondary | ICD-10-CM | POA: Diagnosis not present

## 2020-08-25 DIAGNOSIS — C155 Malignant neoplasm of lower third of esophagus: Secondary | ICD-10-CM | POA: Diagnosis not present

## 2020-08-25 DIAGNOSIS — I2510 Atherosclerotic heart disease of native coronary artery without angina pectoris: Secondary | ICD-10-CM | POA: Diagnosis not present

## 2020-08-26 DIAGNOSIS — C155 Malignant neoplasm of lower third of esophagus: Secondary | ICD-10-CM | POA: Diagnosis not present

## 2020-08-27 ENCOUNTER — Encounter: Payer: Self-pay | Admitting: Oncology

## 2020-08-31 ENCOUNTER — Encounter: Payer: Self-pay | Admitting: *Deleted

## 2020-09-01 NOTE — Progress Notes (Addendum)
Thomas Gonzalez 411       Thomas Gonzalez,Thomas Gonzalez [id number]             [phone]                    Thomas Gonzalez [name] Date of Birth: [birth year]  Referring: Thomas Potter, MD Primary Care: Thomas Anes, MD Primary Cardiologist: No primary care provider on file.  Chief Complaint:    Chief Complaint  Patient presents with  . Esophageal Cancer    Surgical consult, Chest CT 05/20/20, PET Scan 06/02/20, ENDO 08/31/20    History of Present Illness:    Thomas Gonzalez 54 y.o. 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  The patient notes that in March 2021 he had a "stomachache" this was made worse by eating but at the time he did not have obstructive symptoms.  He sought medical attention at that time when he noted dark stools he was noted to be found anemic was given IV iron and ultimately May 18, 2020 he had upper GI endoscopy and a colonoscopy.  Upper GI endoscopy dated May 18, 2020 by Dr. Lyda Gonzalez shows esophageal distal mass friable tissue with ulceration at approximately  38 cm extending to the gastric cardia at approximately 40 cm this was described as circumferential the mass was non obstructing. There was additional mass approximately 1-1/2 cm in length between 30 and 31 cm both of these areas were biopsied and confirmed moderate to poorly differentiated adenocarcinoma [facility] health pathology report S [id number].   A midline PEG tube was placed-the patient notes that he was never obstructed and is never used the PEG tube   CT scan of the chest and abdomen was done as was the PET scan the patient has not had staging esophageal ultrasound/EUS.  Based on his CT and PET scan he was staged clinically  STAGE IVA .  He has been treated with 3 cycles of FOLFOX with concurrent radiation therapy done at [facility].  His last dose of chemotherapy was on August 24.  Last  dose of radiation September 7.  The patient notes that he has tolerated his treatments relatively well he has been careful about chewing his food and has made efforts to maintain his weight, as result he has not had significant weight loss over this..  Follow-up CT of the chest and abdomen was done and the patient was referred to consider surgical treatment  Previous abdominal surgery includes appendectomy in 2018, he has had inguinal hernias repaired surgically x3  Patient is a non-smoker, he has had no previous cardiac history   Current Activity/ Functional Status:  Patient is independent with mobility/ambulation, transfers, ADL's, IADL's.   Zubrod Score: At the time of surgery this patient's most appropriate activity status/level should be described as: [x]     0    Normal activity, no symptoms []     1    Restricted in physical strenuous activity but ambulatory, able to do out light work []     2    Ambulatory and capable of self care, unable to do work activities, up and about               >50 % of waking hours                              []   3    Only limited self care, in bed greater than 50% of waking hours []     4    Completely disabled, no self care, confined to bed or chair []     5    Moribund   Past Medical History:  Diagnosis Date  . Bowel obstruction ([place])    06/21/2017 reported- "years ago"  . Cancer ([place])    basal cell face  . History of kidney stones    x 2    Past Surgical History:  Procedure Laterality Date  . HERNIA REPAIR Right    Inguinial   . KNEE ARTHROSCOPY Left 06/27/2017   Procedure: ARTHROSCOPY KNEE;  Surgeon: Dorna Leitz, MD;  Location: [place];  Service: Orthopedics;  Laterality: Left;    Family History  Family history unknown: Yes     Social History   Tobacco Use  Smoking Status Former Smoker  . Years: 0.20  Smokeless Tobacco Former Systems developer  Tobacco Comment   quit in [place] History   Substance and Sexual Activity  Alcohol Use  No   Comment: in high school- not since     Allergies  Allergen Reactions  . Penicillins Anaphylaxis  . Prednisone Anxiety    Mood swings    Current Outpatient Medications  Medication Sig Dispense Refill  . calcium carbonate (TUMS - DOSED IN MG ELEMENTAL CALCIUM) 500 MG chewable tablet Chew 1 tablet by mouth daily as needed for indigestion or heartburn. (Patient not taking: Reported on 09/02/2020)    . lidocaine (LIDODERM) 5 % USE 1 PATCH EXTERNALLY ONCE DAILY AS NEEDED FOR PAIN. APPLY TOPICALLY TO INTACT SKIN OVER AREA OF GREATEST PAIN. YOU MAY CUT TO SIZE MAXIMUM    . meloxicam (MOBIC) 15 MG tablet Take 15 mg by mouth daily as needed for pain.    Marland Kitchen omeprazole (PRILOSEC) 20 MG capsule Take 20 mg by mouth every morning. (Patient not taking: Reported on 09/02/2020)    . omeprazole (PRILOSEC) 40 MG capsule Take 40 mg by mouth every morning.    . ondansetron (ZOFRAN) 4 MG tablet Take 4 mg by mouth every 4 (four) hours as needed.    Marland Kitchen oxyCODONE (OXY IR/ROXICODONE) 5 MG immediate release tablet Take 5 mg by mouth every 4 (four) hours as needed.    . prochlorperazine (COMPAZINE) 10 MG tablet Take 10 mg by mouth every 6 (six) hours as needed.     No current facility-administered medications for this visit.    Pertinent items are noted in HPI.   Review of Systems:     Cardiac Review of Systems: [Y] = yes  or   [ N ] = no   Chest Pain [  n  ]  Resting SOB [n   ] Exertional SOB  [ n ]  Orthopnea [ n ]   Pedal Edema [ n  ]    Palpitations [ n ] Syncope  [n  ]   Presyncope [ n  ]   General Review of Systems: [Y] = yes [  ]=no Constitional: recent weight change [ n ];  Wt loss over the last 3 months [   ] anorexia [  ]; fatigue Blue.Reese  ]; nausea [  ]; night sweats [  ]; fever [  ]; or chills [  ];           Eye : blurred vision [  ]; diplopia [   ]; vision changes [  ];  Amaurosis  fugax[  ]; Resp: cough [  ];  wheezing[  ];  hemoptysis[  ]; shortness of breath[  ]; paroxysmal nocturnal dyspnea[   ]; dyspnea on exertion[  ]; or orthopnea[  ];  GI:  gallstones[  ], vomiting[  ];  dysphagia[  ]; melena[  ];  hematochezia [  ]; heartburn[  ];   Hx of  Colonoscopy[  ]; GU: kidney stones [  ]; hematuria[  ];   dysuria [  ];  nocturia[  ];  history of     obstruction [  ]; urinary frequency [  ]             Skin: rash, swelling[  ];, hair loss[  ];  peripheral edema[  ];  or itching[  ]; Musculosketetal: myalgias[  ];  joint swelling[  ];  joint erythema[  ];  joint pain[  ];  back pain[  ];  Heme/Lymph: bruising[  ];  bleeding[  ];  anemia[  ];  Neuro: TIA[  ];  headaches[  ];  stroke[  ];  vertigo[  ];  seizures[  ];   paresthesias[  ];  difficulty walking[  ];  Psych:depression[  ]; anxiety[  ];  Endocrine: diabetes[  ];  thyroid dysfunction[  ];  Immunizations: Flu up to date Aqua.Slicker  ]; Pneumococcal up to date [ N ]; COVID-19 vacination completed  [ N  ]  Other:     PHYSICAL EXAMINATION: BP 140/90   Pulse 86   Temp 98.1 F (36.7 C) (Skin)   Resp 20   Ht 5\' 9"  (1.753 m)   Wt 190 lb (86.2 kg)   SpO2 97% Comment: RA  BMI 28.06 kg/m   Wt Readings from Last 3 Encounters:  09/02/20 190 lb (86.2 kg)  08/26/20 191 lb 4.8 oz (86.8 kg)  07/14/20 195 lb 9.6 oz (88.7 kg)    General appearance: alert, cooperative, appears stated age and no distress Head: Normocephalic, without obvious abnormality, atraumatic Neck: no adenopathy, no carotid bruit, no JVD, supple, symmetrical, trachea midline and thyroid not enlarged, symmetric, no tenderness/mass/nodules Lymph nodes: Cervical, supraclavicular, and axillary nodes normal. Resp: clear to auscultation bilaterally Back: symmetric, no curvature. ROM normal. No CVA tenderness. Cardio: regular rate and rhythm, S1, S2 normal, no murmur, click, rub or gallop GI: soft, non-tender; bowel sounds normal; no masses,  no organomegaly Extremities: extremities normal, atraumatic, no cyanosis or edema and Homans sign is negative, no sign of DVT Neurologic:  Grossly normal On physical exam in the upper abdomen midline several centimeters below the xiphoid process is a PEG tube in place-it does not appear infected around it there is some granulation tissue-the patient notes that he has never used it   Diagnostic Studies & Laboratory data:     Recent Radiology Findings:   CLINICAL DATA: Esophageal cancer. Restaging.  EXAM: CT CHEST, ABDOMEN, AND PELVIS WITH CONTRAST  TECHNIQUE: Multidetector CT imaging of the chest, abdomen and pelvis was performed following the standard protocol during bolus administration of intravenous contrast.  CONTRAST: 100 cc Isovue 370  COMPARISON: 04/21/2020  FINDINGS: CT CHEST FINDINGS  Cardiovascular: The heart size is normal. No substantial pericardial effusion. Coronary artery calcification is evident. No thoracic aortic aneurysm.  Mediastinum/Nodes: No mediastinal lymphadenopathy. There is no hilar lymphadenopathy. Interval decrease in wall thickening noted previously at the esophagogastric junction. There is no axillary lymphadenopathy.  Lungs/Pleura: Stable 6 mm perifissural nodule in the right lung (70/4). No new suspicious pulmonary nodule or mass.  No focal airspace consolidation. Insert no effusions  Musculoskeletal: No worrisome lytic or sclerotic osseous abnormality. Stable scattered small sclerotic foci in the humeral heads, likely bone islands.  CT ABDOMEN PELVIS FINDINGS  Hepatobiliary: No suspicious focal abnormality within the liver parenchyma. There is no evidence for gallstones, gallbladder wall thickening, or pericholecystic fluid. No intrahepatic or extrahepatic biliary dilation.  Pancreas: No focal mass lesion. No dilatation of the main duct. No intraparenchymal cyst. No peripancreatic edema.  Spleen: No splenomegaly. No focal mass lesion.  Adrenals/Urinary Tract: No adrenal nodule or mass. Left central sinus cyst noted.Kidneys otherwise unremarkable. No evidence  for hydroureter. The urinary bladder appears normal for the degree of distention.  Stomach/Bowel: Gastrostomy tube noted in the stomach. Duodenum is normally positioned as is the ligament of Treitz. No small bowel wall thickening. No small bowel dilatation. The terminal ileum is normal. The appendix is not visualized, but there is no edema or inflammation in the region of the cecum. Abnormality described previously at the ileocecal valve has resolved in the interval. No gross colonic mass. No colonic wall thickening.  Vascular/Lymphatic: There is abdominal aortic atherosclerosis without aneurysm. There is no gastrohepatic or hepatoduodenal ligament lymphadenopathy. No retroperitoneal or mesenteric lymphadenopathy. Gastrohepatic ligament lymphadenopathy has resolved in the interval. 12 mm gastrohepatic ligament lymph node measured previously is 4 mm today. 8 mm node in this region measured previously is now 5 mm. No retroperitoneal lymphadenopathy. No pelvic sidewall lymphadenopathy.  Reproductive: The prostate gland and seminal vesicles are unremarkable.  Other: No intraperitoneal free fluid.  Musculoskeletal: No worrisome lytic or sclerotic osseous abnormality.  IMPRESSION: 1. Interval response to therapy with notable decrease in wall thickening at the esophagogastric junction and interval resolution of the gastrohepatic ligament lymphadenopathy. No new or progressive findings on today's study. 2. Stable 6 mm perifissural nodule in the right lung. Likely subpleural lymph node. Continued attention on follow-up recommended. 3. Stool/soft tissue density described previously at the ileocecal valve has resolved in the interval. 4. Aortic Atherosclerosis (ICD10-I70.0). I have independently reviewed the above radiology studies  and reviewed the findings with the patient.   CLINICAL DATA: History of esophageal cancer new diagnosis  EXAM: CT CHEST AND  ABDOMEN  TECHNIQUE: Multidetector CT imaging of the chest and abdomen was performed following the standard protocol during bolus administration of intravenous contrast.  CONTRAST: 80 mL Isovue 370  COMPARISON: 08/24/2018 and 10/18/2019 and more remote prior imaging.  FINDINGS: CT CHEST FINDINGS  Cardiovascular: No significant thoracic aortic atherosclerosis. Heart size is normal with signs of coronary artery calcification, three-vessel calcification. No pericardial effusion. Central pulmonary vasculature is of normal caliber and unremarkable on venous phase assessment.  Mediastinum/Nodes: No thoracic inlet lymphadenopathy. No axillary lymphadenopathy. No hilar lymphadenopathy. No mediastinal lymphadenopathy.  Thickening of the gastro esophageal junction with mild surrounding stranding, presumably the site of known esophageal neoplasm.  Lungs/Pleura: Small nodule in the RIGHT mid chest (image 70, series 4) no pleural effusion. No consolidation. Basilar atelectasis. Airways are patent.  Small nodules noted in the lingula (image 84, series 4 3-4 mm nodule. (Image 85, series 04/03 to 4 mm nodule along the pleural surface and a smaller nodule seen anteriorly to this also along the pleural surface.  Musculoskeletal: No chest wall mass. See below for full musculoskeletal details.  CT ABDOMEN PELVIS FINDINGS  Hepatobiliary: Normal liver, no suspicious focal lesion. No pericholecystic stranding. No biliary duct dilation.  Pancreas: Pancreas is normal.  Spleen: Plane is normal size and contour without focal lesion.  Adrenals/Urinary  Tract: Adrenal glands are normal.  Symmetric renal enhancement without suspicious renal lesion.  Stomach/Bowel: Stomach under distended limiting assessment.  More dense stool and or soft tissue density at the ileocecal valve (image 79, series 2) bowel is otherwise unremarkable to the extent evaluated. Pelvic bowel loops are not imaged. No  perienteric or pericolonic stranding.  Vascular/Lymphatic: Spider and noncalcified plaque, mild in the abdominal aorta. No aneurysmal dilation.  Enlarged lymph nodes in the hepatic gastric recess (image 55, series 2) 12 mm hepatic gastric lymph node.  (Image 56, series 2) 8 mm hepatic gastric lymph node.  Small lymph node adjacent to the SMA origin (image 64, series 2) 7 mm.  Other: No ascites.  Musculoskeletal: No acute musculoskeletal process. No destructive bone finding.  IMPRESSION: 1. Thickening of the gastro esophageal junction with mild surrounding stranding, presumably the site of known esophageal neoplasm. 2. Enlarged lymph nodes in the hepatogastric recess compatible local nodal disease. 3. Small lymph node adjacent to SMA origin suspicious given morphology, just above the level of the LEFT renal vein.. 4. More dense stool and or soft tissue density at the ileocecal valve. Recommend correlation with any recent colonoscopy results to exclude the possibility of concomitant colonic neoplasm.. 5. Small pulmonary nodules, nonspecific, attention on follow-up. 6. Aortic atherosclerosis.  These results will be called to the ordering clinician or representative by the Radiologist Assistant, and communication documented in the PACS or [employer].  Aortic Atherosclerosis (ICD10-I70.0).   Electronically Signed By: Zetta Bills M.D.  CLINICAL DATA: Initial treatment strategy for esophageal cancer.  EXAM: NUCLEAR MEDICINE PET SKULL BASE TO THIGH  TECHNIQUE: 12.4 mCi F-18 FDG was injected intravenously. Full-ring PET imaging was performed from the skull vertex to thigh after the radiotracer. CT data was obtained and used for attenuation correction and anatomic localization.  Fasting blood glucose: 106 mg/dl  COMPARISON: Multiple exams, including CT scan 05/21/2020 from [facility].  FINDINGS: Mediastinal blood pool activity: SUV max 2.0  Liver  activity: SUV max 3.6  HEAD/NECK: Moderate misregistration in the head. Motion artifact in the head on the PET acquisition. This likely explains the reduced signal in the left cerebral hemisphere. No underlying abnormality in this region on the CT data.  Incidental CT findings: Chronic bilateral maxillary sinusitis, right greater than left.  CHEST: Distal esophageal activity extending into the gastric cardia region, maximum SUV 12.3. Associated mild soft tissue thickening in the distal esophageal wall. No hypermetabolic adenopathy in the thorax.  Incidental CT findings: Left anterior descending and circumflex coronary artery atherosclerosis with mild right coronary artery atherosclerosis. Mild cardiomegaly.  0.6 by 0.3 cm right middle lobe pulmonary nodule, stable. The tiny lingular nodule seen on prior CT is poorly identified today.  ABDOMEN/PELVIS: Clustered right gastric lymph nodes including a 1.3 cm in short axis right gastric node on image 194/3, these demonstrate accentuated abnormal metabolic activity but are difficult to separate out from the adjacent gastric activity, maximum SUV is approximately 8.9. No hypermetabolic liver lesion or other findings of metastatic spread in the abdomen/pelvis.  No hypermetabolic activity in the ileocecal region where there was a question of soft tissue prominence on prior CT.  Incidental CT findings: Punctate 1-2 mm left mid kidney nonobstructive renal calculus on image 232/3. Similar punctate 1-2 mm right kidney lower pole calculus, image 231.  Aortoiliac atherosclerotic vascular disease. Prior left groin hernia repair. Mild prostatomegaly with dystrophic calcifications in the prostate gland.  SKELETON: No significant abnormal hypermetabolic activity in this region.  Incidental CT  findings: Bridging spurring of both sacroiliac joints.  IMPRESSION: 1. Hypermetabolic gastroesophageal mass, maximum SUV 12.3. Immediately adjacent  right gastric lymph nodes near the mass are thought to be hypermetabolic with maximum SUV 8.9. No other findings of metastatic spread. 2. Other imaging findings of potential clinical significance: Chronic bilateral maxillary sinusitis. Aortic Atherosclerosis (ICD10-I70.0). Coronary atherosclerosis. Mild cardiomegaly. Nonspecific 6 by 3 mm right middle lobe pulmonary nodule, not appreciably hypermetabolic but below sensitive PET-CT size thresholds. Single punctate nonobstructive renal calculi. Mild prostatomegaly.   Electronically Signed By: Van Clines M.D. On: 06/02/2020 12:54 I have independently reviewed the above radiology studies  and reviewed the findings with the patient.    Recent Lab Findings: Lab Results  Component Value Date   WBC 8.0 02/16/2020   HGB 10.0 (L) 02/16/2020   HCT 30.0 (L) 02/16/2020   PLT 407 02/16/2020   GLUCOSE 115 (A) 02/06/2020   ALT 16 02/06/2020   AST 27 02/06/2020   NA 139 02/06/2020   K 4.2 02/06/2020   CREATININE 0.90 02/06/2020   BUN 33 02/06/2020   Path : [place] 05/19/2020 [id number] Moderate  to poorly differentiated  adenocarcinoma distal esophagus        Assessment / Plan:   #1 advanced stage adenocarcinoma involving the lower esophagus from 30 cm with skip lesion to 40 cm-now status post 3 cycles of FOLFOX and radiation therapy with with significant response on restaging CT scan of the chest and abdomen but does not appear to be complete response.  Pretreatment patient was clinically staged at IVA -this is presuming a T3 lesion and N2 nodes without evidence of distant spread.  EUS was not used in this staging.  I discussed with the patient in detail the place for surgical treatment of esophageal cancer, including resection after preoperative chemo radiation.  He has showed a response, his overall health allows the consideration of esophageal resection.  I discussed with him proceeding with resection in the near  future as he is now almost 5 weeks following his radiation therapy.  We will plan removal of his PEG tube, and surgical resection of the esophagus with cervical esophagogastrostomy.  The magnitude of the operation risks involved and expectations have been discussed with the patient.  The typical  hospital course has been reviewed, including risks such as esophageal anastomotic leak, pneumonia, recurrent nerve injury, pneumonia, pulmonary emboli, death have been discussed.  Patient has had his questions answered and is willing to proceed  Patient has been given written material concerning esophageal surgery  Tentatively plan to proceed on November 2.     Grace Isaac MD      [address] [place] [address] Office [phone]     09/03/2020 11:48 AM

## 2020-09-02 ENCOUNTER — Encounter: Payer: Self-pay | Admitting: *Deleted

## 2020-09-02 ENCOUNTER — Institutional Professional Consult (permissible substitution) (INDEPENDENT_AMBULATORY_CARE_PROVIDER_SITE_OTHER): Payer: BC Managed Care – PPO | Admitting: Cardiothoracic Surgery

## 2020-09-02 ENCOUNTER — Other Ambulatory Visit: Payer: Self-pay

## 2020-09-02 ENCOUNTER — Other Ambulatory Visit: Payer: Self-pay | Admitting: *Deleted

## 2020-09-02 VITALS — BP 140/90 | HR 86 | Temp 98.1°F | Resp 20 | Ht 69.0 in | Wt 190.0 lb

## 2020-09-02 DIAGNOSIS — C159 Malignant neoplasm of esophagus, unspecified: Secondary | ICD-10-CM

## 2020-09-08 ENCOUNTER — Telehealth: Payer: Self-pay

## 2020-09-08 NOTE — Telephone Encounter (Signed)
Attending Provider Statement form completed and Faxed to [place] @ [phone]. Beginning leave from [place] will be date of surgery 09/20/2020 through 12/20/2020. Original form mailed to home address

## 2020-09-15 NOTE — Progress Notes (Signed)
[place], [place] [address] [place] [address] Phone: [phone] Fax: [phone]      Your procedure is scheduled on Monday 09/20/2020.  Report to [facility] Main Entrance "A" at 05:30 A.M., and check in at the Admitting office.  Call this number if you have problems the morning of surgery:  [phone]   Call [phone] if you have any questions prior to your surgery date Monday-Friday 8am-4pm    Remember:  Do not eat or drink after midnight the night before your surgery   Take these medicines the morning of surgery with A SIP OF WATER: Omeprazole (Prilosec)   As of today, STOP taking any Aspirin (unless otherwise instructed by your surgeon) Aleve, Naproxen, Ibuprofen, Motrin, Advil, Goody's, BC's, all herbal medications, fish oil, and all vitamins.                      Do not wear jewelry            Do not wear lotions, powders, colognes, or deodorant.            Do not shave 48 hours prior to surgery.  Men may shave face and neck.            Do not bring valuables to the hospital.            [facility] is not responsible for any belongings or valuables.  Do NOT Smoke (Tobacco/Vaping) or drink Alcohol 24 hours prior to your procedure  If you use a CPAP at night, you may bring all equipment for your overnight stay.   Contacts, glasses, dentures or bridgework may not be worn into surgery.      For patients admitted to the hospital, discharge time will be determined by your treatment team.   Patients discharged the day of surgery will not be allowed to drive home, and someone needs to stay with them for 24 hours.    Special instructions:   [place]- Preparing For Surgery  Before surgery, you can play an important role. Because skin is not sterile, your skin needs to be as free of germs as possible. You can reduce the number of germs on your skin by washing with CHG (chlorahexidine gluconate) Soap before  surgery.  CHG is an antiseptic cleaner which kills germs and bonds with the skin to continue killing germs even after washing.    Oral Hygiene is also important to reduce your risk of infection.  Remember - BRUSH YOUR TEETH THE MORNING OF SURGERY WITH YOUR REGULAR TOOTHPASTE  Please do not use if you have an allergy to CHG or antibacterial soaps. If your skin becomes reddened/irritated stop using the CHG.  Do not shave (including legs and underarms) for at least 48 hours prior to first CHG shower. It is OK to shave your face.  Please follow these instructions carefully.   1. Shower the NIGHT BEFORE SURGERY and the MORNING OF SURGERY with CHG Soap.   2. If you chose to wash your hair, wash your hair first as usual with your normal shampoo.  3. After you shampoo, rinse your hair and body thoroughly to remove the shampoo.  4. Use CHG as you would any other liquid soap. You can apply CHG directly to the skin and wash gently with a scrungie or a clean washcloth.   5. Apply the CHG Soap to your body ONLY FROM THE NECK DOWN.  Do not use on open wounds or open sores. Avoid contact with your eyes, ears, mouth and genitals (private parts). Wash Face and genitals (private parts)  with your normal soap.   6. Wash thoroughly, paying special attention to the area where your surgery will be performed.  7. Thoroughly rinse your body with warm water from the neck down.  8. DO NOT shower/wash with your normal soap after using and rinsing off the CHG Soap.  9. Pat yourself dry with a CLEAN TOWEL.  10. Wear CLEAN PAJAMAS to bed the night before surgery  11. Place CLEAN SHEETS on your bed the night of your first shower and DO NOT SLEEP WITH PETS.   Day of Surgery: Shower with CHG soap as directed Wear Clean/Comfortable clothing the morning of surgery Do not apply any deodorants/lotions.   Remember to brush your teeth WITH YOUR REGULAR TOOTHPASTE.   Please read over the following fact sheets that you  were given.

## 2020-09-16 ENCOUNTER — Encounter: Payer: Self-pay | Admitting: Oncology

## 2020-09-16 ENCOUNTER — Other Ambulatory Visit (HOSPITAL_COMMUNITY)
Admission: RE | Admit: 2020-09-16 | Discharge: 2020-09-16 | Disposition: A | Discharge status: Home / Self Care | Payer: BC Managed Care – PPO | Source: Ambulatory Visit | Attending: Cardiothoracic Surgery | Admitting: Cardiothoracic Surgery

## 2020-09-16 ENCOUNTER — Other Ambulatory Visit: Payer: Self-pay

## 2020-09-16 ENCOUNTER — Encounter (HOSPITAL_COMMUNITY)
Admission: RE | Admit: 2020-09-16 | Discharge: 2020-09-16 | Disposition: A | Discharge status: Home / Self Care | Payer: BC Managed Care – PPO | Source: Ambulatory Visit | Attending: Cardiothoracic Surgery | Admitting: Cardiothoracic Surgery

## 2020-09-16 ENCOUNTER — Encounter: Payer: Self-pay | Admitting: Radiation Oncology

## 2020-09-16 ENCOUNTER — Other Ambulatory Visit: Payer: Self-pay | Admitting: *Deleted

## 2020-09-16 ENCOUNTER — Encounter: Payer: Self-pay | Admitting: Specialist

## 2020-09-16 ENCOUNTER — Encounter (HOSPITAL_COMMUNITY): Payer: Self-pay

## 2020-09-16 DIAGNOSIS — Z01818 Encounter for other preprocedural examination: Secondary | ICD-10-CM | POA: Diagnosis not present

## 2020-09-16 DIAGNOSIS — Z01812 Encounter for preprocedural laboratory examination: Secondary | ICD-10-CM | POA: Insufficient documentation

## 2020-09-16 DIAGNOSIS — Z20822 Contact with and (suspected) exposure to covid-19: Secondary | ICD-10-CM | POA: Insufficient documentation

## 2020-09-16 DIAGNOSIS — C159 Malignant neoplasm of esophagus, unspecified: Secondary | ICD-10-CM

## 2020-09-16 HISTORY — DX: Gastro-esophageal reflux disease without esophagitis: K21.9

## 2020-09-16 LAB — BLOOD GAS, ARTERIAL
Acid-Base Excess: 1.5 mmol/L (ref 0.0–2.0)
Bicarbonate: 25.4 mmol/L (ref 20.0–28.0)
FIO2: 21
O2 Saturation: 97.6 %
Patient temperature: 37
pCO2 arterial: 38.9 mmHg (ref 32.0–48.0)
pH, Arterial: 7.431 (ref 7.350–7.450)
pO2, Arterial: 98.3 mmHg (ref 83.0–108.0)

## 2020-09-16 LAB — COMPREHENSIVE METABOLIC PANEL
ALT: 73 U/L — ABNORMAL HIGH (ref 0–44)
AST: 42 U/L — ABNORMAL HIGH (ref 15–41)
Albumin: 3.6 g/dL (ref 3.5–5.0)
Alkaline Phosphatase: 135 U/L — ABNORMAL HIGH (ref 38–126)
Anion gap: 9 (ref 5–15)
BUN: 20 mg/dL (ref 6–20)
CO2: 22 mmol/L (ref 22–32)
Calcium: 9.2 mg/dL (ref 8.9–10.3)
Chloride: 108 mmol/L (ref 98–111)
Creatinine, Ser: 0.86 mg/dL (ref 0.61–1.24)
GFR, Estimated: >60 mL/min (ref >60)
Glucose, Bld: 107 mg/dL — ABNORMAL HIGH (ref 70–99)
Potassium: 4.5 mmol/L (ref 3.5–5.1)
Sodium: 139 mmol/L (ref 135–145)
Total Bilirubin: 0.7 mg/dL (ref 0.3–1.2)
Total Protein: 6.4 g/dL — ABNORMAL LOW (ref 6.5–8.1)

## 2020-09-16 LAB — TYPE AND SCREEN
ABO/RH(D): O POS
Antibody Screen: NEGATIVE

## 2020-09-16 LAB — URINALYSIS, ROUTINE W REFLEX MICROSCOPIC
Bilirubin Urine: NEGATIVE
Glucose, UA: NEGATIVE mg/dL
Hgb urine dipstick: NEGATIVE
Ketones, ur: NEGATIVE mg/dL
Leukocytes,Ua: NEGATIVE
Nitrite: NEGATIVE
Protein, ur: NEGATIVE mg/dL
Specific Gravity, Urine: 1.015 (ref 1.005–1.030)
pH: 6 (ref 5.0–8.0)

## 2020-09-16 LAB — SURGICAL PCR SCREEN
MRSA, PCR: NEGATIVE
Staphylococcus aureus: NEGATIVE

## 2020-09-16 LAB — PROTIME-INR
INR: 1 (ref 0.8–1.2)
Prothrombin Time: 12.8 seconds (ref 11.4–15.2)

## 2020-09-16 LAB — APTT: aPTT: 33 seconds (ref 24–36)

## 2020-09-16 LAB — CBC
HCT: 47.4 % (ref 39.0–52.0)
Hemoglobin: 15.8 g/dL (ref 13.0–17.0)
MCH: 31.9 pg (ref 26.0–34.0)
MCHC: 33.3 g/dL (ref 30.0–36.0)
MCV: 95.6 fL (ref 80.0–100.0)
Platelets: 265 10*3/uL (ref 150–400)
RBC: 4.96 MIL/uL (ref 4.22–5.81)
RDW: 17.4 % — ABNORMAL HIGH (ref 11.5–15.5)
WBC: 8.2 10*3/uL (ref 4.0–10.5)
nRBC: 0 % (ref 0.0–0.2)

## 2020-09-16 LAB — SARS CORONAVIRUS 2 (TAT 6-24 HRS): SARS Coronavirus 2: NEGATIVE

## 2020-09-16 NOTE — Progress Notes (Signed)
Called Ryanne with [facility] for instruction for prep, none in orders. Left message awaiting call back.   [id number] Spoke again to patient and he actually had the bowel prep instructions with him and was clear on what he needed to do.

## 2020-09-16 NOTE — Progress Notes (Signed)
PCP - Dr. [name] Cardiologist - denies  Chest x-ray - DOS EKG - 09/16/20 Stress Test - Long time ago cannot recall year ECHO - Denies Cardiac Cath - denies  Sleep Study - Denies OSA  DM - denies  COVID TEST- 09/16/20   Anesthesia review: No  Patient denies shortness of breath, fever, cough and chest pain at PAT appointment   All instructions explained to the patient, with a verbal understanding of the material. Patient agrees to go over the instructions while at home for a better understanding. Patient also instructed to self quarantine after being tested for COVID-19. The opportunity to ask questions was provided.

## 2020-09-20 ENCOUNTER — Inpatient Hospital Stay (HOSPITAL_COMMUNITY): Payer: BC Managed Care – PPO

## 2020-09-20 ENCOUNTER — Inpatient Hospital Stay (HOSPITAL_COMMUNITY): Payer: BC Managed Care – PPO | Admitting: Certified Registered Nurse Anesthetist

## 2020-09-20 ENCOUNTER — Encounter (HOSPITAL_COMMUNITY)
Admission: RE | Disposition: A | Discharge status: Home Health | Payer: Self-pay | Source: Home / Self Care | Attending: Cardiothoracic Surgery

## 2020-09-20 ENCOUNTER — Inpatient Hospital Stay (HOSPITAL_COMMUNITY)
Admission: RE | Admit: 2020-09-20 | Discharge: 2020-10-06 | DRG: 327 | Disposition: A | Discharge status: Home Health | Payer: BC Managed Care – PPO | Attending: Cardiothoracic Surgery | Admitting: Cardiothoracic Surgery

## 2020-09-20 ENCOUNTER — Other Ambulatory Visit: Payer: Self-pay

## 2020-09-20 ENCOUNTER — Encounter (HOSPITAL_COMMUNITY): Payer: Self-pay | Admitting: Cardiothoracic Surgery

## 2020-09-20 DIAGNOSIS — Z9049 Acquired absence of other specified parts of digestive tract: Secondary | ICD-10-CM

## 2020-09-20 DIAGNOSIS — K219 Gastro-esophageal reflux disease without esophagitis: Secondary | ICD-10-CM | POA: Diagnosis not present

## 2020-09-20 DIAGNOSIS — C155 Malignant neoplasm of lower third of esophagus: Secondary | ICD-10-CM | POA: Diagnosis not present

## 2020-09-20 DIAGNOSIS — C159 Malignant neoplasm of esophagus, unspecified: Secondary | ICD-10-CM

## 2020-09-20 DIAGNOSIS — Z87442 Personal history of urinary calculi: Secondary | ICD-10-CM | POA: Diagnosis not present

## 2020-09-20 DIAGNOSIS — Z01818 Encounter for other preprocedural examination: Secondary | ICD-10-CM | POA: Diagnosis not present

## 2020-09-20 DIAGNOSIS — I10 Essential (primary) hypertension: Secondary | ICD-10-CM | POA: Diagnosis present

## 2020-09-20 DIAGNOSIS — Z0389 Encounter for observation for other suspected diseases and conditions ruled out: Secondary | ICD-10-CM | POA: Diagnosis not present

## 2020-09-20 DIAGNOSIS — D62 Acute posthemorrhagic anemia: Secondary | ICD-10-CM | POA: Diagnosis not present

## 2020-09-20 DIAGNOSIS — Z09 Encounter for follow-up examination after completed treatment for conditions other than malignant neoplasm: Secondary | ICD-10-CM

## 2020-09-20 DIAGNOSIS — K5730 Diverticulosis of large intestine without perforation or abscess without bleeding: Secondary | ICD-10-CM | POA: Diagnosis not present

## 2020-09-20 DIAGNOSIS — Z880 Allergy status to penicillin: Secondary | ICD-10-CM

## 2020-09-20 DIAGNOSIS — Z888 Allergy status to other drugs, medicaments and biological substances status: Secondary | ICD-10-CM

## 2020-09-20 DIAGNOSIS — C771 Secondary and unspecified malignant neoplasm of intrathoracic lymph nodes: Secondary | ICD-10-CM | POA: Diagnosis not present

## 2020-09-20 DIAGNOSIS — K259 Gastric ulcer, unspecified as acute or chronic, without hemorrhage or perforation: Secondary | ICD-10-CM | POA: Diagnosis not present

## 2020-09-20 DIAGNOSIS — J9811 Atelectasis: Secondary | ICD-10-CM | POA: Diagnosis not present

## 2020-09-20 DIAGNOSIS — D72829 Elevated white blood cell count, unspecified: Secondary | ICD-10-CM | POA: Diagnosis not present

## 2020-09-20 DIAGNOSIS — J90 Pleural effusion, not elsewhere classified: Secondary | ICD-10-CM | POA: Diagnosis not present

## 2020-09-20 DIAGNOSIS — Z87891 Personal history of nicotine dependence: Secondary | ICD-10-CM

## 2020-09-20 DIAGNOSIS — Z9889 Other specified postprocedural states: Secondary | ICD-10-CM

## 2020-09-20 DIAGNOSIS — Z419 Encounter for procedure for purposes other than remedying health state, unspecified: Secondary | ICD-10-CM

## 2020-09-20 DIAGNOSIS — Z4682 Encounter for fitting and adjustment of non-vascular catheter: Secondary | ICD-10-CM | POA: Diagnosis not present

## 2020-09-20 DIAGNOSIS — K56609 Unspecified intestinal obstruction, unspecified as to partial versus complete obstruction: Secondary | ICD-10-CM | POA: Diagnosis not present

## 2020-09-20 DIAGNOSIS — Z9689 Presence of other specified functional implants: Secondary | ICD-10-CM

## 2020-09-20 DIAGNOSIS — K660 Peritoneal adhesions (postprocedural) (postinfection): Secondary | ICD-10-CM | POA: Diagnosis present

## 2020-09-20 DIAGNOSIS — J939 Pneumothorax, unspecified: Secondary | ICD-10-CM | POA: Diagnosis not present

## 2020-09-20 DIAGNOSIS — J439 Emphysema, unspecified: Secondary | ICD-10-CM | POA: Diagnosis not present

## 2020-09-20 DIAGNOSIS — K2289 Other specified disease of esophagus: Secondary | ICD-10-CM | POA: Diagnosis not present

## 2020-09-20 DIAGNOSIS — C160 Malignant neoplasm of cardia: Secondary | ICD-10-CM | POA: Diagnosis not present

## 2020-09-20 DIAGNOSIS — K921 Melena: Secondary | ICD-10-CM | POA: Diagnosis not present

## 2020-09-20 HISTORY — PX: JEJUNOSTOMY: SHX313

## 2020-09-20 HISTORY — PX: REMOVAL OF GASTROSTOMY TUBE: SHX6058

## 2020-09-20 HISTORY — PX: VIDEO BRONCHOSCOPY: SHX5072

## 2020-09-20 HISTORY — PX: COMPLETE ESOPHAGECTOMY: SHX5286

## 2020-09-20 LAB — POCT I-STAT 7, (LYTES, BLD GAS, ICA,H+H)
Acid-Base Excess: 2 mmol/L (ref 0.0–2.0)
Acid-Base Excess: 0 mmol/L (ref 0.0–2.0)
Acid-Base Excess: 0 mmol/L (ref 0.0–2.0)
Acid-base deficit: 2 mmol/L (ref 0.0–2.0)
Acid-base deficit: 2 mmol/L (ref 0.0–2.0)
Bicarbonate: 26.4 mmol/L (ref 20.0–28.0)
Bicarbonate: 22.8 mmol/L (ref 20.0–28.0)
Bicarbonate: 24.8 mmol/L (ref 20.0–28.0)
Bicarbonate: 25.6 mmol/L (ref 20.0–28.0)
Bicarbonate: 22.9 mmol/L (ref 20.0–28.0)
Calcium, Ion: 1.21 mmol/L (ref 1.15–1.40)
Calcium, Ion: 1.11 mmol/L — ABNORMAL LOW (ref 1.15–1.40)
Calcium, Ion: 1.16 mmol/L (ref 1.15–1.40)
Calcium, Ion: 1.18 mmol/L (ref 1.15–1.40)
Calcium, Ion: 1.13 mmol/L — ABNORMAL LOW (ref 1.15–1.40)
HCT: 42 % (ref 39.0–52.0)
HCT: 39 % (ref 39.0–52.0)
HCT: 38 % — ABNORMAL LOW (ref 39.0–52.0)
HCT: 41 % (ref 39.0–52.0)
HCT: 41 % (ref 39.0–52.0)
Hemoglobin: 14.3 g/dL (ref 13.0–17.0)
Hemoglobin: 13.3 g/dL (ref 13.0–17.0)
Hemoglobin: 12.9 g/dL — ABNORMAL LOW (ref 13.0–17.0)
Hemoglobin: 13.9 g/dL (ref 13.0–17.0)
Hemoglobin: 13.9 g/dL (ref 13.0–17.0)
O2 Saturation: 100 %
O2 Saturation: 99 %
O2 Saturation: 100 %
O2 Saturation: 99 %
O2 Saturation: 97 %
Potassium: 4 mmol/L (ref 3.5–5.1)
Potassium: 4 mmol/L (ref 3.5–5.1)
Potassium: 4.2 mmol/L (ref 3.5–5.1)
Potassium: 4.2 mmol/L (ref 3.5–5.1)
Potassium: 3.9 mmol/L (ref 3.5–5.1)
Sodium: 139 mmol/L (ref 135–145)
Sodium: 139 mmol/L (ref 135–145)
Sodium: 138 mmol/L (ref 135–145)
Sodium: 137 mmol/L (ref 135–145)
Sodium: 140 mmol/L (ref 135–145)
TCO2: 28 mmol/L (ref 22–32)
TCO2: 24 mmol/L (ref 22–32)
TCO2: 26 mmol/L (ref 22–32)
TCO2: 27 mmol/L (ref 22–32)
TCO2: 24 mmol/L (ref 22–32)
pCO2 arterial: 40.6 mmHg (ref 32.0–48.0)
pCO2 arterial: 37.6 mmHg (ref 32.0–48.0)
pCO2 arterial: 40.8 mmHg (ref 32.0–48.0)
pCO2 arterial: 43.3 mmHg (ref 32.0–48.0)
pCO2 arterial: 40.7 mmHg (ref 32.0–48.0)
pH, Arterial: 7.42 (ref 7.350–7.450)
pH, Arterial: 7.392 (ref 7.350–7.450)
pH, Arterial: 7.391 (ref 7.350–7.450)
pH, Arterial: 7.379 (ref 7.350–7.450)
pH, Arterial: 7.358 (ref 7.350–7.450)
pO2, Arterial: 170 mmHg — ABNORMAL HIGH (ref 83.0–108.0)
pO2, Arterial: 130 mmHg — ABNORMAL HIGH (ref 83.0–108.0)
pO2, Arterial: 217 mmHg — ABNORMAL HIGH (ref 83.0–108.0)
pO2, Arterial: 134 mmHg — ABNORMAL HIGH (ref 83.0–108.0)
pO2, Arterial: 95 mmHg (ref 83.0–108.0)

## 2020-09-20 LAB — BASIC METABOLIC PANEL
Anion gap: 10 (ref 5–15)
BUN: 11 mg/dL (ref 6–20)
CO2: 22 mmol/L (ref 22–32)
Calcium: 8.6 mg/dL — ABNORMAL LOW (ref 8.9–10.3)
Chloride: 105 mmol/L (ref 98–111)
Creatinine, Ser: 0.9 mg/dL (ref 0.61–1.24)
GFR, Estimated: >60 mL/min (ref >60)
Glucose, Bld: 177 mg/dL — ABNORMAL HIGH (ref 70–99)
Potassium: 4.4 mmol/L (ref 3.5–5.1)
Sodium: 137 mmol/L (ref 135–145)

## 2020-09-20 LAB — CBC
HCT: 41.6 % (ref 39.0–52.0)
Hemoglobin: 14.5 g/dL (ref 13.0–17.0)
MCH: 32.4 pg (ref 26.0–34.0)
MCHC: 34.9 g/dL (ref 30.0–36.0)
MCV: 92.9 fL (ref 80.0–100.0)
Platelets: 210 10*3/uL (ref 150–400)
RBC: 4.48 MIL/uL (ref 4.22–5.81)
RDW: 16 % — ABNORMAL HIGH (ref 11.5–15.5)
WBC: 15 10*3/uL — ABNORMAL HIGH (ref 4.0–10.5)
nRBC: 0 % (ref 0.0–0.2)

## 2020-09-20 LAB — ABO/RH: ABO/RH(D): O POS

## 2020-09-20 LAB — GLUCOSE, CAPILLARY
Glucose-Capillary: 156 mg/dL — ABNORMAL HIGH (ref 70–99)
Glucose-Capillary: 177 mg/dL — ABNORMAL HIGH (ref 70–99)

## 2020-09-20 SURGERY — BRONCHOSCOPY, VIDEO-ASSISTED
Anesthesia: General | Site: Abdomen

## 2020-09-20 MED ORDER — MIDAZOLAM HCL 2 MG/2ML IJ SOLN
INTRAMUSCULAR | Status: AC
  Filled 2020-09-20: qty 2

## 2020-09-20 MED ORDER — FENTANYL CITRATE (PF) 250 MCG/5ML IJ SOLN
INTRAMUSCULAR | Status: AC
  Filled 2020-09-20: qty 5

## 2020-09-20 MED ORDER — PROPOFOL 500 MG/50ML IV EMUL
INTRAVENOUS | Status: DC | PRN
  Administered 2020-09-20: 50 ug/kg/min via INTRAVENOUS

## 2020-09-20 MED ORDER — PROPOFOL 10 MG/ML IV BOLUS
INTRAVENOUS | Status: DC | PRN
  Administered 2020-09-20: 50 mg via INTRAVENOUS
  Administered 2020-09-20: 150 mg via INTRAVENOUS

## 2020-09-20 MED ORDER — DEXMEDETOMIDINE HCL IN NACL 400 MCG/100ML IV SOLN: 0.0000 ug/kg/h | INTRAVENOUS | Status: DC

## 2020-09-20 MED ORDER — MIDAZOLAM HCL 2 MG/2ML IJ SOLN
INTRAMUSCULAR | Status: DC | PRN
  Administered 2020-09-20: 2 mg via INTRAVENOUS

## 2020-09-20 MED ORDER — EPHEDRINE SULFATE-NACL 50-0.9 MG/10ML-% IV SOSY
PREFILLED_SYRINGE | INTRAVENOUS | Status: DC | PRN
  Administered 2020-09-20: 10 mg via INTRAVENOUS

## 2020-09-20 MED ORDER — LIDOCAINE 2% (20 MG/ML) 5 ML SYRINGE
INTRAMUSCULAR | Status: DC | PRN
  Administered 2020-09-20: 100 mg via INTRAVENOUS

## 2020-09-20 MED ORDER — LACTATED RINGERS IV SOLN: INTRAVENOUS | Status: DC | PRN

## 2020-09-20 MED ORDER — ROCURONIUM BROMIDE 10 MG/ML (PF) SYRINGE
PREFILLED_SYRINGE | INTRAVENOUS | Status: AC
  Filled 2020-09-20: qty 10

## 2020-09-20 MED ORDER — KETAMINE HCL 50 MG/5ML IJ SOSY
PREFILLED_SYRINGE | INTRAMUSCULAR | Status: AC
  Filled 2020-09-20: qty 5

## 2020-09-20 MED ORDER — LIDOCAINE 5 % EX PTCH
1.0000 | MEDICATED_PATCH | CUTANEOUS | Status: DC
  Administered 2020-09-20 – 2020-10-05 (×13): 1 via TRANSDERMAL
  Filled 2020-09-20 (×19): qty 1

## 2020-09-20 MED ORDER — 0.9 % SODIUM CHLORIDE (POUR BTL) OPTIME
TOPICAL | Status: DC | PRN
  Administered 2020-09-20 (×2): 1000 mL

## 2020-09-20 MED ORDER — PHENYLEPHRINE 40 MCG/ML (10ML) SYRINGE FOR IV PUSH (FOR BLOOD PRESSURE SUPPORT)
PREFILLED_SYRINGE | INTRAVENOUS | Status: AC
  Filled 2020-09-20: qty 10

## 2020-09-20 MED ORDER — ONDANSETRON HCL 4 MG/2ML IJ SOLN
INTRAMUSCULAR | Status: AC
  Filled 2020-09-20: qty 2

## 2020-09-20 MED ORDER — PANTOPRAZOLE SODIUM 40 MG IV SOLR
40.0000 mg | Freq: Two times a day (BID) | INTRAVENOUS | Status: DC
  Administered 2020-09-20 – 2020-09-29 (×18): 40 mg via INTRAVENOUS
  Filled 2020-09-20 (×18): qty 40

## 2020-09-20 MED ORDER — DEXAMETHASONE SODIUM PHOSPHATE 10 MG/ML IJ SOLN
INTRAMUSCULAR | Status: DC | PRN
  Administered 2020-09-20: 10 mg via INTRAVENOUS

## 2020-09-20 MED ORDER — ORAL CARE MOUTH RINSE: 15.0000 mL | Freq: Once | OROMUCOSAL | Status: AC

## 2020-09-20 MED ORDER — CLINDAMYCIN PHOSPHATE 900 MG/50ML IV SOLN
900.0000 mg | INTRAVENOUS | Status: AC
  Administered 2020-09-20 (×2): 900 mg via INTRAVENOUS
  Filled 2020-09-20: qty 50

## 2020-09-20 MED ORDER — CLINDAMYCIN PHOSPHATE 900 MG/50ML IV SOLN
900.0000 mg | INTRAVENOUS | Status: DC
  Filled 2020-09-20: qty 50

## 2020-09-20 MED ORDER — DEXAMETHASONE SODIUM PHOSPHATE 10 MG/ML IJ SOLN
INTRAMUSCULAR | Status: AC
  Filled 2020-09-20: qty 1

## 2020-09-20 MED ORDER — PHENYLEPHRINE 40 MCG/ML (10ML) SYRINGE FOR IV PUSH (FOR BLOOD PRESSURE SUPPORT)
PREFILLED_SYRINGE | INTRAVENOUS | Status: DC | PRN
  Administered 2020-09-20: 40 ug via INTRAVENOUS
  Administered 2020-09-20 (×2): 80 ug via INTRAVENOUS

## 2020-09-20 MED ORDER — PHENYLEPHRINE HCL-NACL 10-0.9 MG/250ML-% IV SOLN
INTRAVENOUS | Status: DC | PRN
  Administered 2020-09-20: 20 ug/min via INTRAVENOUS

## 2020-09-20 MED ORDER — LIDOCAINE 2% (20 MG/ML) 5 ML SYRINGE
INTRAMUSCULAR | Status: AC
  Filled 2020-09-20: qty 5

## 2020-09-20 MED ORDER — CHLORHEXIDINE GLUCONATE 0.12 % MT SOLN
15.0000 mL | OROMUCOSAL | Status: AC
  Administered 2020-09-20: 15 mL via OROMUCOSAL

## 2020-09-20 MED ORDER — MORPHINE SULFATE (PF) 2 MG/ML IV SOLN
1.0000 mg | INTRAVENOUS | Status: DC | PRN
  Administered 2020-09-20: 4 mg via INTRAVENOUS
  Administered 2020-09-20: 2 mg via INTRAVENOUS
  Administered 2020-09-20 (×3): 4 mg via INTRAVENOUS
  Administered 2020-09-21 (×7): 2 mg via INTRAVENOUS
  Filled 2020-09-20 (×3): qty 1
  Filled 2020-09-20 (×3): qty 2
  Filled 2020-09-20: qty 1
  Filled 2020-09-20: qty 2
  Filled 2020-09-20 (×4): qty 1

## 2020-09-20 MED ORDER — KETAMINE HCL 10 MG/ML IJ SOLN
INTRAMUSCULAR | Status: DC | PRN
  Administered 2020-09-20: 30 mg via INTRAVENOUS
  Administered 2020-09-20 (×2): 10 mg via INTRAVENOUS

## 2020-09-20 MED ORDER — ALBUTEROL SULFATE (2.5 MG/3ML) 0.083% IN NEBU: 2.5000 mg | INHALATION_SOLUTION | Freq: Four times a day (QID) | RESPIRATORY_TRACT | Status: AC | PRN

## 2020-09-20 MED ORDER — EPHEDRINE 5 MG/ML INJ
INTRAVENOUS | Status: AC
  Filled 2020-09-20: qty 10

## 2020-09-20 MED ORDER — ALBUMIN HUMAN 5 % IV SOLN: INTRAVENOUS | Status: DC | PRN

## 2020-09-20 MED ORDER — ENOXAPARIN SODIUM 40 MG/0.4ML ~~LOC~~ SOLN: 40.0000 mg | Freq: Every day | SUBCUTANEOUS | Status: DC

## 2020-09-20 MED ORDER — FENTANYL CITRATE (PF) 250 MCG/5ML IJ SOLN
INTRAMUSCULAR | Status: DC | PRN
  Administered 2020-09-20 (×10): 50 ug via INTRAVENOUS

## 2020-09-20 MED ORDER — PROPOFOL 10 MG/ML IV BOLUS
INTRAVENOUS | Status: AC
  Filled 2020-09-20: qty 20

## 2020-09-20 MED ORDER — CHLORHEXIDINE GLUCONATE CLOTH 2 % EX PADS
6.0000 | MEDICATED_PAD | Freq: Every day | CUTANEOUS | Status: DC
  Administered 2020-09-20 – 2020-09-22 (×3): 6 via TOPICAL

## 2020-09-20 MED ORDER — INSULIN ASPART 100 UNIT/ML ~~LOC~~ SOLN
0.0000 [IU] | SUBCUTANEOUS | Status: DC
  Administered 2020-09-20: 4 [IU] via SUBCUTANEOUS
  Administered 2020-09-21 – 2020-10-02 (×44): 2 [IU] via SUBCUTANEOUS
  Administered 2020-10-02: 4 [IU] via SUBCUTANEOUS
  Administered 2020-10-02 – 2020-10-06 (×9): 2 [IU] via SUBCUTANEOUS

## 2020-09-20 MED ORDER — CHLORHEXIDINE GLUCONATE 0.12 % MT SOLN
15.0000 mL | Freq: Once | OROMUCOSAL | Status: AC
  Administered 2020-09-20: 15 mL via OROMUCOSAL
  Filled 2020-09-20: qty 15

## 2020-09-20 MED ORDER — DEXTROSE-NACL 5-0.9 % IV SOLN: INTRAVENOUS | Status: DC

## 2020-09-20 MED ORDER — SODIUM CHLORIDE 0.9% FLUSH: 10.0000 mL | INTRAVENOUS | Status: DC | PRN

## 2020-09-20 MED ORDER — METOPROLOL TARTRATE 5 MG/5ML IV SOLN
5.0000 mg | Freq: Four times a day (QID) | INTRAVENOUS | Status: DC
  Administered 2020-09-20: 5 mg via INTRAVENOUS
  Filled 2020-09-20: qty 5

## 2020-09-20 MED ORDER — DEXMEDETOMIDINE (PRECEDEX) IN NS 20 MCG/5ML (4 MCG/ML) IV SYRINGE
PREFILLED_SYRINGE | INTRAVENOUS | Status: DC | PRN
  Administered 2020-09-20 (×4): 8 ug via INTRAVENOUS

## 2020-09-20 MED ORDER — PHENOL 1.4 % MT LIQD
1.0000 | OROMUCOSAL | Status: DC | PRN
  Filled 2020-09-20: qty 177

## 2020-09-20 MED ORDER — ONDANSETRON HCL 4 MG/2ML IJ SOLN: 4.0000 mg | INTRAMUSCULAR | Status: DC | PRN

## 2020-09-20 MED ORDER — LACTATED RINGERS IV SOLN: INTRAVENOUS | Status: DC

## 2020-09-20 MED ORDER — DEXMEDETOMIDINE HCL IN NACL 80 MCG/20ML IV SOLN
INTRAVENOUS | Status: AC
  Filled 2020-09-20: qty 20

## 2020-09-20 MED ORDER — ONDANSETRON HCL 4 MG/2ML IJ SOLN
INTRAMUSCULAR | Status: DC | PRN
  Administered 2020-09-20 (×2): 4 mg via INTRAVENOUS

## 2020-09-20 MED ORDER — CLINDAMYCIN PHOSPHATE 900 MG/50ML IV SOLN
900.0000 mg | Freq: Three times a day (TID) | INTRAVENOUS | Status: AC
  Administered 2020-09-20 – 2020-09-21 (×2): 900 mg via INTRAVENOUS
  Filled 2020-09-20 (×3): qty 50

## 2020-09-20 MED ORDER — HYDROMORPHONE HCL 1 MG/ML IJ SOLN: 0.2500 mg | INTRAMUSCULAR | Status: DC | PRN

## 2020-09-20 MED ORDER — ROCURONIUM BROMIDE 10 MG/ML (PF) SYRINGE
PREFILLED_SYRINGE | INTRAVENOUS | Status: DC | PRN
  Administered 2020-09-20: 70 mg via INTRAVENOUS
  Administered 2020-09-20 (×2): 30 mg via INTRAVENOUS

## 2020-09-20 MED ORDER — PROPOFOL 1000 MG/100ML IV EMUL: 5.0000 ug/kg/min | INTRAVENOUS | Status: DC

## 2020-09-20 SURGICAL SUPPLY — 132 items
ADAPTER VALVE BIOPSY EBUS (MISCELLANEOUS) IMPLANT
ADPTR VALVE BIOPSY EBUS (MISCELLANEOUS)
BLADE CLIPPER SURG (BLADE) ×4 IMPLANT
BRUSH CYTOL CELLEBRITY 1.5X140 (MISCELLANEOUS) IMPLANT
CANISTER SUCT 3000ML PPV (MISCELLANEOUS) ×8 IMPLANT
CATH FOLEY 2WAY SLVR 18FR 30CC (CATHETERS) ×4 IMPLANT
CATH ROBINSON RED A/P 18FR (CATHETERS) ×4 IMPLANT
CATH THORACIC 28FR (CATHETERS) ×4 IMPLANT
CLIP FOGARTY SPRING 6M (CLIP) ×4 IMPLANT
CLIP VESOCCLUDE LG 6/CT (CLIP) IMPLANT
CLIP VESOCCLUDE MED 24/CT (CLIP) ×8 IMPLANT
CLIP VESOCCLUDE SM WIDE 24/CT (CLIP) ×8 IMPLANT
CNTNR URN SCR LID CUP LEK RST (MISCELLANEOUS) ×6 IMPLANT
CONT SPEC 4OZ STRL OR WHT (MISCELLANEOUS) ×2
COUNTER NEEDLE 20 DBL MAG RED (NEEDLE) ×4 IMPLANT
COVER BACK TABLE 60X90IN (DRAPES) ×4 IMPLANT
COVER PROBE W GEL 5X96 (DRAPES) ×4 IMPLANT
DERMABOND ADVANCED (GAUZE/BANDAGES/DRESSINGS) ×2
DERMABOND ADVANCED .7 DNX12 (GAUZE/BANDAGES/DRESSINGS) ×6 IMPLANT
DRAIN CHANNEL 28F RND 3/8 FF (WOUND CARE) IMPLANT
DRAIN PENROSE 1/2X36 LTX STRL (DRAIN) ×4 IMPLANT
DRAPE CV SPLIT W-CLR ANES SCRN (DRAPES) ×4 IMPLANT
DRAPE INCISE IOBAN 66X45 STRL (DRAPES) ×4 IMPLANT
DRAPE PERI GROIN 82X75IN TIB (DRAPES) ×4 IMPLANT
DRAPE SLUSH/WARMER DISC (DRAPES) ×4 IMPLANT
DRSG AQUACEL AG ADV 3.5X 6 (GAUZE/BANDAGES/DRESSINGS) ×4 IMPLANT
DRSG AQUACEL AG ADV 3.5X14 (GAUZE/BANDAGES/DRESSINGS) ×4 IMPLANT
ELECT BLADE 4.0 EZ CLEAN MEGAD (MISCELLANEOUS) ×12
ELECT BLADE 6.5 EXT (BLADE) IMPLANT
ELECT NEEDLE TIP 2.8 STRL (NEEDLE) ×8 IMPLANT
ELECT REM PT RETURN 9FT ADLT (ELECTROSURGICAL) ×8
ELECTRODE BLDE 4.0 EZ CLN MEGD (MISCELLANEOUS) ×9 IMPLANT
ELECTRODE REM PT RTRN 9FT ADLT (ELECTROSURGICAL) ×6 IMPLANT
FILTER SMOKE EVAC ULPA (FILTER) ×4 IMPLANT
FORCEPS BIOP RJ4 1.8 (CUTTING FORCEPS) IMPLANT
GAUZE SPONGE 4X4 12PLY STRL (GAUZE/BANDAGES/DRESSINGS) ×4 IMPLANT
GLOVE BIO SURGEON STRL SZ 6 (GLOVE) ×4 IMPLANT
GLOVE BIO SURGEON STRL SZ 6.5 (GLOVE) ×24 IMPLANT
GLOVE BIO SURGEON STRL SZ7.5 (GLOVE) ×4 IMPLANT
GOWN STRL REUS W/ TWL LRG LVL3 (GOWN DISPOSABLE) ×12 IMPLANT
GOWN STRL REUS W/TWL LRG LVL3 (GOWN DISPOSABLE) ×4
HANDLE STAPLE ENDO GIA SHORT (STAPLE) ×1
HANDLE SUCTION POOLE (INSTRUMENTS) IMPLANT
HEMOSTAT SURGICEL 2X14 (HEMOSTASIS) IMPLANT
INSERT FOGARTY 61MM (MISCELLANEOUS) IMPLANT
KIT BASIN OR (CUSTOM PROCEDURE TRAY) ×4 IMPLANT
KIT CLEAN ENDO COMPLIANCE (KITS) ×4 IMPLANT
KIT SUCTION CATH 14FR (SUCTIONS) ×4 IMPLANT
KIT TURNOVER KIT B (KITS) ×4 IMPLANT
LOOP VESSEL MINI RED (MISCELLANEOUS) ×4 IMPLANT
MARKER SKIN DUAL TIP RULER LAB (MISCELLANEOUS) IMPLANT
NS IRRIG 1000ML POUR BTL (IV SOLUTION) ×8 IMPLANT
OIL SILICONE PENTAX (PARTS (SERVICE/REPAIRS)) ×4 IMPLANT
PACK CHEST (CUSTOM PROCEDURE TRAY) ×4 IMPLANT
PAD ARMBOARD 7.5X6 YLW CONV (MISCELLANEOUS) ×8 IMPLANT
PENCIL BUTTON HOLSTER BLD 10FT (ELECTRODE) IMPLANT
PENCIL SMOKE EVACUATOR (MISCELLANEOUS) ×4 IMPLANT
PLUG CATH AND CAP STER (CATHETERS) ×4 IMPLANT
PROBE NERVBE PRASS .33 (MISCELLANEOUS) ×4 IMPLANT
RELOAD EGIA 45 MED/THCK PURPLE (STAPLE) ×8 IMPLANT
RELOAD EGIA TRIS TAN 45 CVD (STAPLE) IMPLANT
RELOAD LINEAR CUT PROX 55 BLUE (ENDOMECHANICALS) ×52 IMPLANT
RELOAD TRI 2.0 30 MED THCK SUL (STAPLE) ×4 IMPLANT
RELOAD TRI 2.0 30 VAS MED SUL (STAPLE) ×4 IMPLANT
RETAINER VISCERA MED (MISCELLANEOUS) ×4 IMPLANT
RETRACTOR WND ALEXIS 25 LRG (MISCELLANEOUS) ×3 IMPLANT
RTRCTR WOUND ALEXIS 25CM LRG (MISCELLANEOUS) ×4
SEALER TISSUE X1 CVD JAW (INSTRUMENTS) ×4 IMPLANT
SHEARS HARMONIC ACE PLUS 36CM (ENDOMECHANICALS) ×4 IMPLANT
SHEARS HARMONIC HDI 20CM (ELECTROSURGICAL) IMPLANT
SLEEVE SUCTION 125 (MISCELLANEOUS) ×4 IMPLANT
SPONGE LAP 18X18 RF (DISPOSABLE) ×32 IMPLANT
SPONGE TONSIL TAPE 1.25 RFD (DISPOSABLE) ×4 IMPLANT
STAPLE RELOAD 2.5MM WHITE (STAPLE) IMPLANT
STAPLER ENDO GIA 12MM SHORT (STAPLE) ×3 IMPLANT
STAPLER PROXIMATE 55 BLUE (STAPLE) ×4 IMPLANT
STAPLER VASCULAR ECHELON 35 (CUTTER) IMPLANT
STAPLER VISISTAT 35W (STAPLE) IMPLANT
SUCTION POOLE HANDLE (INSTRUMENTS)
SUT ETHILON 3 0 FSL (SUTURE) ×16 IMPLANT
SUT ETHILON 3 0 PS 1 (SUTURE) ×8 IMPLANT
SUT PDS AB 1 TP1 96 (SUTURE) ×8 IMPLANT
SUT PDS AB 4-0 SH 27 (SUTURE) IMPLANT
SUT PROLENE 0 CT 1 CR/8 (SUTURE) IMPLANT
SUT PROLENE 2 0 MH 48 (SUTURE) IMPLANT
SUT PROLENE 3 0 RB 1 (SUTURE) ×4 IMPLANT
SUT PROLENE 3 0 SH1 36 (SUTURE) ×4 IMPLANT
SUT PROLENE 4 0 RB 1 (SUTURE) ×1
SUT PROLENE 4-0 RB1 .5 CRCL 36 (SUTURE) ×3 IMPLANT
SUT SILK  1 MH (SUTURE)
SUT SILK 1 MH (SUTURE) IMPLANT
SUT SILK 1 TIES 10X30 (SUTURE) IMPLANT
SUT SILK 2 0 SH CR/8 (SUTURE) ×8 IMPLANT
SUT SILK 2 0SH CR/8 30 (SUTURE) ×8 IMPLANT
SUT SILK 3 0 SH CR/8 (SUTURE) ×4 IMPLANT
SUT SILK 3 0SH CR/8 30 (SUTURE) ×4 IMPLANT
SUT SILK 4 0 SH CR/8 (SUTURE) ×4 IMPLANT
SUT VIC AB 1 CTX 27 (SUTURE) IMPLANT
SUT VIC AB 2-0 CT1 18 (SUTURE) IMPLANT
SUT VIC AB 2-0 CTX 36 (SUTURE) ×8 IMPLANT
SUT VIC AB 3-0 SH 18 (SUTURE) IMPLANT
SUT VIC AB 3-0 SH 27 (SUTURE) ×1
SUT VIC AB 3-0 SH 27X BRD (SUTURE) ×3 IMPLANT
SUT VIC AB 3-0 X1 27 (SUTURE) ×4 IMPLANT
SUT VIC AB 4-0 SH 18 (SUTURE) ×12 IMPLANT
SUT VICRYL 2 TP 1 (SUTURE) IMPLANT
SYR 10ML LL (SYRINGE) IMPLANT
SYR 20ML ECCENTRIC (SYRINGE) ×4 IMPLANT
SYR 20ML LL LF (SYRINGE) IMPLANT
SYR 30ML LL (SYRINGE) ×4 IMPLANT
SYR 30ML SLIP (SYRINGE) ×4 IMPLANT
SYR 5ML LUER SLIP (SYRINGE) IMPLANT
SYR TOOMEY 50ML (SYRINGE) ×4 IMPLANT
SYSTEM SAHARA CHEST DRAIN ATS (WOUND CARE) ×4 IMPLANT
TAPE UMBILICAL 1/8 X36 TWILL (MISCELLANEOUS) IMPLANT
TOWEL GREEN STERILE (TOWEL DISPOSABLE) ×4 IMPLANT
TOWEL GREEN STERILE FF (TOWEL DISPOSABLE) ×4 IMPLANT
TRAP FLUID SMOKE EVACUATOR (MISCELLANEOUS) ×4 IMPLANT
TRAP SPECIMEN MUCUS 40CC (MISCELLANEOUS) IMPLANT
TRAY FOLEY MTR SLVR 16FR STAT (SET/KITS/TRAYS/PACK) ×4 IMPLANT
TUBE CONNECTING 12X1/4 (SUCTIONS) ×4 IMPLANT
TUBE CONNECTING 20X1/4 (TUBING) ×8 IMPLANT
TUBE ENDOTRAC EMG 7X10.2 (MISCELLANEOUS) IMPLANT
TUBE ENDOTRAC EMG 8X11.3 (MISCELLANEOUS) ×4 IMPLANT
TUBE ENDOTRACH  EMG 6MMTUBE EN (MISCELLANEOUS)
TUBE ENDOTRACH EMG 6MMTUBE EN (MISCELLANEOUS) IMPLANT
TUBE J 18FR (TUBING) IMPLANT
TUBE SALEM SUMP 16 FR W/ARV (TUBING) ×4 IMPLANT
VALVE BIOPSY  SINGLE USE (MISCELLANEOUS) ×1
VALVE BIOPSY SINGLE USE (MISCELLANEOUS) ×3 IMPLANT
VALVE SUCTION BRONCHIO DISP (MISCELLANEOUS) ×4 IMPLANT
WATER STERILE IRR 1000ML POUR (IV SOLUTION) ×4 IMPLANT

## 2020-09-20 NOTE — Anesthesia Procedure Notes (Signed)
Arterial Line Insertion Start/End11/11/2019 7:00 AM, 09/20/2020 7:15 AM Performed by: Dorthea Cove, CRNA, CRNA  Patient location: Pre-op. Preanesthetic checklist: patient identified, IV checked, site marked, risks and benefits discussed, surgical consent, monitors and equipment checked, pre-op evaluation, timeout performed and anesthesia consent Lidocaine 1% used for infiltration Left, radial was placed Catheter size: 20 Fr Hand hygiene performed  and maximum sterile barriers used   Attempts: 1 Procedure performed without using ultrasound guided technique. Following insertion, dressing applied and Biopatch. Post procedure assessment: normal and unchanged  Patient tolerated the procedure well with no immediate complications.

## 2020-09-20 NOTE — Anesthesia Preprocedure Evaluation (Signed)
Anesthesia Evaluation  Patient identified by MRN, date of birth, ID band Patient awake    Reviewed: Allergy & Precautions, NPO status , Patient's Chart, lab work & pertinent test results  Airway Mallampati: II  TM Distance: >3 FB Neck ROM: Full    Dental no notable dental hx.    Pulmonary neg pulmonary ROS, former smoker,    Pulmonary exam normal breath sounds clear to auscultation       Cardiovascular negative cardio ROS Normal cardiovascular exam Rhythm:Regular Rate:Normal     Neuro/Psych negative neurological ROS  negative psych ROS   GI/Hepatic Neg liver ROS, GERD  ,  Endo/Other  negative endocrine ROS  Renal/GU negative Renal ROS  negative genitourinary   Musculoskeletal negative musculoskeletal ROS (+)   Abdominal   Peds negative pediatric ROS (+)  Hematology negative hematology ROS (+)   Anesthesia Other Findings   Reproductive/Obstetrics negative OB ROS                             Anesthesia Physical Anesthesia Plan  ASA: III  Anesthesia Plan: General   Post-op Pain Management:    Induction: Intravenous  PONV Risk Score and Plan: 2 and Ondansetron, Dexamethasone and Treatment may vary due to age or medical condition  Airway Management Planned: Double Lumen EBT  Additional Equipment: Arterial line, CVP and Ultrasound Guidance Line Placement  Intra-op Plan:   Post-operative Plan: Possible Post-op intubation/ventilation  Informed Consent: I have reviewed the patients History and Physical, chart, labs and discussed the procedure including the risks, benefits and alternatives for the proposed anesthesia with the patient or authorized representative who has indicated his/her understanding and acceptance.     Dental advisory given  Plan Discussed with: CRNA and Surgeon  Anesthesia Plan Comments:         Anesthesia Quick Evaluation

## 2020-09-20 NOTE — Brief Op Note (Addendum)
      [address]       [place] [address]             [phone]      09/20/2020  2:55 PM  PATIENT:  Thomas Gonzalez  54 y.o. male  PRE-OPERATIVE DIAGNOSIS:  ESOPHAGEAL CANCER  POST-OPERATIVE DIAGNOSIS:  ESOPHAGEAL CANCER  PROCEDURE:  Procedure(s): VIDEO BRONCHOSCOPY (N/A) Transhiatal total ESOPHAGECTOMY with Pyloroplasty (N/A) JEJUNOSTOMY (N/A) REMOVAL OF GASTROSTOMY TUBE  SURGEON:  Surgeon(s) and Role:    * Grace Isaac, MD - Primary    * Lightfoot, Lucile Crater, MD - Assisting  PHYSICIAN ASSISTANT: Jadene Pierini PA-C  ANESTHESIA:   general  EBL:  400 mL   BLOOD ADMINISTERED:none  DRAINS: none   LOCAL MEDICATIONS USED:  NONE  SPECIMEN:  Source of Specimen:  ESOPHAGUS/STOMACH PORTION  DISPOSITION OF SPECIMEN:  PATHOLOGY  COUNTS:  YES   DICTATION: .Other Dictation: Dictation Number PENDING  PLAN OF CARE: Admit to inpatient   PATIENT DISPOSITION:  PACU - hemodynamically stable.   Delay start of Pharmacological VTE agent (>24hrs) due to surgical blood loss or risk of bleeding: yes  COMPLICATIONS: NO KNOWN

## 2020-09-20 NOTE — Progress Notes (Deleted)
Pt flipped to PS/CPAP by  Dr. Tamala Julian (PCCM). RT aware. Will draw ABG at 2320 to assess readiness to extubate. Monitoring closely.  Pt able to f/c in all extremities, lift head off pillow, stick out tongue on command.

## 2020-09-20 NOTE — Anesthesia Procedure Notes (Signed)
Anesthesia Procedure Image    

## 2020-09-20 NOTE — Procedures (Signed)
Extubation Procedure Note  Patient Details:   Name: Thomas Gonzalez DOB: [birth year] MRN: [id number]   Airway Documentation:    Vent end date: 09/20/20 Vent end time: 1648   Evaluation  O2 sats: stable throughout Complications: No apparent complications Patient did tolerate procedure well. Bilateral Breath Sounds: Clear   Patient extubated per SICU wean protocol & placed on 5L [place]. Patient met all parameters prior to extubation. Able to cough & speak post extubation.  Kathie Dike 09/20/2020, 4:54 PM

## 2020-09-20 NOTE — Progress Notes (Signed)
EVENING ROUNDS NOTE :     [address] [place]       [place] [address]             [phone]                 Day of Surgery Procedure(s) (LRB): VIDEO BRONCHOSCOPY (N/A) Transhiatal total ESOPHAGECTOMY with Pyloroplasty (N/A) JEJUNOSTOMY (N/A) REMOVAL OF GASTROSTOMY TUBE   Total Length of Stay:  LOS: 0 days  Events:   Weaning to extubate     BP 136/85    Pulse 87    Temp 98.5 F (36.9 C) (Temporal)    Resp 11    Ht 5\' 9"  (1.753 m)    Wt 88.5 kg    SpO2 99%    BMI 28.80 kg/m      Vent Mode: SIMV;PSV FiO2 (%):  [50 %] 50 % Set Rate:  [12 bmp] 12 bmp Vt Set:  [560 mL] 560 mL PEEP:  [5 cmH20] 5 cmH20 Pressure Support:  [10 cmH20] 10 cmH20 Plateau Pressure:  [13 cmH20] 13 cmH20   clindamycin (CLEOCIN) IV     dextrose 5 % and 0.9% NaCl 125 mL/hr at 09/20/20 1628   propofol (DIPRIVAN) infusion 15 mcg/kg/min (09/20/20 1628)    No intake/output data recorded.   CBC Latest Ref Rng & Units 09/20/2020 09/20/2020 09/20/2020  WBC 4.0 - 10.5 K/uL 15.0(H) - -  Hemoglobin 13.0 - 17.0 g/dL 14.5 12.9(L) 13.3  Hematocrit 39 - 52 % 41.6 38.0(L) 39.0  Platelets 150 - 400 K/uL 210 - -    BMP Latest Ref Rng & Units 09/20/2020 09/20/2020 09/20/2020  Glucose 70 - 99 mg/dL - - -  BUN 6 - 20 mg/dL - - -  Creatinine 0.61 - 1.24 mg/dL - - -  Sodium 135 - 145 mmol/L 138 139 139  Potassium 3.5 - 5.1 mmol/L 4.2 4.0 4.0  Chloride 98 - 111 mmol/L - - -  CO2 22 - 32 mmol/L - - -  Calcium 8.9 - 10.3 mg/dL - - -    ABG    Component Value Date/Time   PHART 7.391 09/20/2020 1354   PCO2ART 40.8 09/20/2020 1354   PO2ART 217 (H) 09/20/2020 1354   HCO3 24.8 09/20/2020 1354   TCO2 26 09/20/2020 1354   ACIDBASEDEF 2.0 09/20/2020 1142   O2SAT 100.0 09/20/2020 1354       Melodie Bouillon, MD 09/20/2020 4:34 PM

## 2020-09-20 NOTE — H&P (Signed)
[address]       [place] [address]             [phone]                    Thomas Gonzalez [facility]aract Medical Record #[id number] Date of Birth: 15-Feb-1966  Referring: [name], MD Primary Care: Lillard Anes, MD Primary Cardiologist: No primary care provider on file.  Chief Complaint:        Chief Complaint  Patient presents with  . Esophageal Cancer    Surgical consult, Chest CT 05/20/20, PET Scan 06/02/20, ENDO 08/31/20     History of Present Illness:    Thomas Gonzalez 54 y.o. male is seen in the office for surgical evaluation of adenocarcinoma of the distal esophagus.  The patient notes that in March 2021 he had a "stomachache" this was made worse by eating but at the time he did not have obstructive symptoms.  He sought medical attention at that time when he noted dark stools he was noted to be found anemic was given IV iron and ultimately May 18, 2020 he had upper GI endoscopy and a colonoscopy.  Upper GI endoscopy dated May 18, 2020 by Dr. Lyda Jester shows esophageal distal mass friable tissue with ulceration at approximately  38 cm extending to the gastric cardia at approximately 40 cm this was described as circumferential the mass was non obstructing. There was additional mass approximately 1-1/2 cm in length between 30 and 31 cm both of these areas were biopsied and confirmed moderate to poorly differentiated adenocarcinoma [facility] pathology report S CF [id number] 71.   A midline PEG tube was placed-the patient notes that he was never obstructed and is never used the PEG tube   CT scan of the chest and abdomen was done as was a  PET scan the patient has not had staging esophageal ultrasound/EUS.  Based on his CT and PET scan he was staged clinically  STAGE IVA .  He has been treated with 3 cycles of FOLFOX with concurrent radiation therapy done at [facility].  His last dose of chemotherapy was on August 24.   Last dose of radiation September 7.  The patient notes that he has tolerated his treatments relatively well he has been careful about chewing his food and has made efforts to maintain his weight, as result he has not had significant weight loss over this..  Follow-up CT of the chest and abdomen was done and the patient was referred to consider surgical treatment  Previous abdominal surgery includes appendectomy in 2018, he has had inguinal hernias repaired surgically x3  Patient is a non-smoker, he has had no previous cardiac history   Current Activity/ Functional Status:  Patient is independent with mobility/ambulation, transfers, ADL's, IADL's.   Zubrod Score: At the time of surgery this patient's most appropriate activity status/level should be described as: [x]     0    Normal activity, no symptoms []     1    Restricted in physical strenuous activity but ambulatory, able to do out light work []     2    Ambulatory and capable of self care, unable to do work activities, up and about               >50 % of waking hours                              []   3    Only limited self care, in bed greater than 50% of waking hours []     4    Completely disabled, no self care, confined to bed or chair []     5    Moribund   Past Medical History:  Diagnosis Date  . Bowel obstruction ([place])    06/21/2017 reported- "years ago"  . Cancer ([place])    basal cell face  . GERD (gastroesophageal reflux disease)   . History of kidney stones    x 2    Past Surgical History:  Procedure Laterality Date  . APPENDECTOMY  2019  . HERNIA REPAIR Bilateral 2007,2010,2020   Inguinial   . KNEE ARTHROSCOPY Left 06/27/2017   Procedure: ARTHROSCOPY KNEE;  Surgeon: Dorna Leitz, MD;  Location: [place];  Service: Orthopedics;  Laterality: Left;    Family History  Family history unknown: Yes     Social History   Tobacco Use  Smoking Status Former Smoker  . Years: 0.20  Smokeless Tobacco Former Systems developer  Tobacco  Comment   quit in [place] History   Substance and Sexual Activity  Alcohol Use No   Comment: in high school- not since     Allergies  Allergen Reactions  . Penicillins Anaphylaxis  . Prednisone Anxiety    Mood swings    Current Facility-Administered Medications  Medication Dose Route Frequency Provider Last Rate Last Admin  . clindamycin (CLEOCIN) IVPB 900 mg  900 mg Intravenous On Call to OR Grace Isaac, MD      . lactated ringers infusion   Intravenous Continuous Stoltzfus, March Rummage, DO        Pertinent items are noted in HPI.   Review of Systems:     Cardiac Review of Systems: [Y] = yes  or   [ N ] = no   Chest Pain [  n  ]  Resting SOB [n   ] Exertional SOB  [ n ]  Orthopnea [ n ]   Pedal Edema [ n  ]    Palpitations [ n ] Syncope  [n  ]   Presyncope [ n  ]   General Review of Systems: [Y] = yes [  ]=no Constitional: recent weight change [ n ];  Wt loss over the last 3 months [   ] anorexia [  ]; fatigue Blue.Reese  ]; nausea [  ]; night sweats [  ]; fever [  ]; or chills [  ];           Eye : blurred vision [  ]; diplopia [   ]; vision changes [  ];  Amaurosis fugax[  ]; Resp: cough [  ];  wheezing[  ];  hemoptysis[  ]; shortness of breath[  ]; paroxysmal nocturnal dyspnea[  ]; dyspnea on exertion[  ]; or orthopnea[  ];  GI:  gallstones[  ], vomiting[  ];  dysphagia[  ]; melena[  ];  hematochezia [  ]; heartburn[  ];   Hx of  Colonoscopy[  ]; GU: kidney stones [  ]; hematuria[  ];   dysuria [  ];  nocturia[  ];  history of     obstruction [  ]; urinary frequency [  ]             Skin: rash, swelling[  ];, hair loss[  ];  peripheral edema[  ];  or itching[  ]; Musculosketetal: myalgias[  ];  joint swelling[  ];  joint erythema[  ];  joint pain[  ];  back pain[  ];  Heme/Lymph: bruising[  ];  bleeding[  ];  anemia[  ];  Neuro: TIA[  ];  headaches[  ];  stroke[  ];  vertigo[  ];  seizures[  ];   paresthesias[  ];  difficulty walking[  ];  Psych:depression[  ];  anxiety[  ];  Endocrine: diabetes[  ];  thyroid dysfunction[  ];  Immunizations: Flu up to date Aqua.Slicker  ]; Pneumococcal up to date [ N ]; COVID-19 vacination completed  [ N  ]  Other:     PHYSICAL EXAMINATION: BP 136/85   Pulse 84   Temp 98.5 F (36.9 C) (Temporal)   Resp 18   Ht 5\' 9"  (1.753 m)   Wt 88.5 kg   SpO2 96%   BMI 28.80 kg/m   Wt Readings from Last 3 Encounters:  09/20/20 88.5 kg  09/16/20 89.1 kg  09/02/20 86.2 kg    General appearance: alert, cooperative, appears stated age and no distress Head: Normocephalic, without obvious abnormality, atraumatic Neck: no adenopathy, no carotid bruit, no JVD, supple, symmetrical, trachea midline and thyroid not enlarged, symmetric, no tenderness/mass/nodules Lymph nodes: Cervical, supraclavicular, and axillary nodes normal. Resp: clear to auscultation bilaterally Back: symmetric, no curvature. ROM normal. No CVA tenderness. Cardio: regular rate and rhythm, S1, S2 normal, no murmur, click, rub or gallop GI: soft, non-tender; bowel sounds normal; no masses,  no organomegaly Extremities: extremities normal, atraumatic, no cyanosis or edema and Homans sign is negative, no sign of DVT Neurologic: Grossly normal On physical exam in the upper abdomen midline several centimeters below the xiphoid process is a PEG tube in place-it does not appear infected around it there is some granulation tissue-the patient notes that he has never used it   Diagnostic Studies & Laboratory data:     Recent Radiology Findings:   CLINICAL DATA: Esophageal cancer. Restaging.  EXAM: DG Chest 2 View  Result Date: 09/20/2020 CLINICAL DATA:  Preop for esophageal surgery EXAM: CHEST - 2 VIEW COMPARISON:  08/25/2020 FINDINGS: Porta catheter with tip at the SVC. There is no edema, consolidation, effusion, or pneumothorax. Normal heart size and mediastinal contours. Percutaneous gastrostomy tube. IMPRESSION: No evidence of active disease. Electronically Signed    By: Monte Fantasia M.D.   On: 09/20/2020 06:57   CT CHEST, ABDOMEN, AND PELVIS WITH CONTRAST  TECHNIQUE: Multidetector CT imaging of the chest, abdomen and pelvis was performed following the standard protocol during bolus administration of intravenous contrast.  CONTRAST: 100 cc Isovue 370  COMPARISON: 04/21/2020  FINDINGS: CT CHEST FINDINGS  Cardiovascular: The heart size is normal. No substantial pericardial effusion. Coronary artery calcification is evident. No thoracic aortic aneurysm.  Mediastinum/Nodes: No mediastinal lymphadenopathy. There is no hilar lymphadenopathy. Interval decrease in wall thickening noted previously at the esophagogastric junction. There is no axillary lymphadenopathy.  Lungs/Pleura: Stable 6 mm perifissural nodule in the right lung (70/4). No new suspicious pulmonary nodule or mass. No focal airspace consolidation. Insert no effusions  Musculoskeletal: No worrisome lytic or sclerotic osseous abnormality. Stable scattered small sclerotic foci in the humeral heads, likely bone islands.  CT ABDOMEN PELVIS FINDINGS  Hepatobiliary: No suspicious focal abnormality within the liver parenchyma. There is no evidence for gallstones, gallbladder wall thickening, or pericholecystic fluid. No intrahepatic or extrahepatic biliary dilation.  Pancreas: No focal mass lesion. No dilatation of the main duct. No intraparenchymal cyst. No peripancreatic edema.  Spleen: No splenomegaly. No focal mass  lesion.  Adrenals/Urinary Tract: No adrenal nodule or mass. Left central sinus cyst noted.Kidneys otherwise unremarkable. No evidence for hydroureter. The urinary bladder appears normal for the degree of distention.  Stomach/Bowel: Gastrostomy tube noted in the stomach. Duodenum is normally positioned as is the ligament of Treitz. No small bowel wall thickening. No small bowel dilatation. The terminal ileum is normal. The appendix is not visualized, but  there is no edema or inflammation in the region of the cecum. Abnormality described previously at the ileocecal valve has resolved in the interval. No gross colonic mass. No colonic wall thickening.  Vascular/Lymphatic: There is abdominal aortic atherosclerosis without aneurysm. There is no gastrohepatic or hepatoduodenal ligament lymphadenopathy. No retroperitoneal or mesenteric lymphadenopathy. Gastrohepatic ligament lymphadenopathy has resolved in the interval. 12 mm gastrohepatic ligament lymph node measured previously is 4 mm today. 8 mm node in this region measured previously is now 5 mm. No retroperitoneal lymphadenopathy. No pelvic sidewall lymphadenopathy.  Reproductive: The prostate gland and seminal vesicles are unremarkable.  Other: No intraperitoneal free fluid.  Musculoskeletal: No worrisome lytic or sclerotic osseous abnormality.  IMPRESSION: 1. Interval response to therapy with notable decrease in wall thickening at the esophagogastric junction and interval resolution of the gastrohepatic ligament lymphadenopathy. No new or progressive findings on today's study. 2. Stable 6 mm perifissural nodule in the right lung. Likely subpleural lymph node. Continued attention on follow-up recommended. 3. Stool/soft tissue density described previously at the ileocecal valve has resolved in the interval. 4. Aortic Atherosclerosis (ICD10-I70.0). I have independently reviewed the above radiology studies  and reviewed the findings with the patient.   CLINICAL DATA: History of esophageal cancer new diagnosis  EXAM: CT CHEST AND ABDOMEN  TECHNIQUE: Multidetector CT imaging of the chest and abdomen was performed following the standard protocol during bolus administration of intravenous contrast.  CONTRAST: 80 mL Isovue 370  COMPARISON: 08/24/2018 and 10/18/2019 and more remote prior imaging.  FINDINGS: CT CHEST FINDINGS  Cardiovascular: No significant thoracic aortic  atherosclerosis. Heart size is normal with signs of coronary artery calcification, three-vessel calcification. No pericardial effusion. Central pulmonary vasculature is of normal caliber and unremarkable on venous phase assessment.  Mediastinum/Nodes: No thoracic inlet lymphadenopathy. No axillary lymphadenopathy. No hilar lymphadenopathy. No mediastinal lymphadenopathy.  Thickening of the gastro esophageal junction with mild surrounding stranding, presumably the site of known esophageal neoplasm.  Lungs/Pleura: Small nodule in the RIGHT mid chest (image 70, series 4) no pleural effusion. No consolidation. Basilar atelectasis. Airways are patent.  Small nodules noted in the lingula (image 84, series 4 3-4 mm nodule. (Image 85, series 04/03 to 4 mm nodule along the pleural surface and a smaller nodule seen anteriorly to this also along the pleural surface.  Musculoskeletal: No chest wall mass. See below for full musculoskeletal details.  CT ABDOMEN PELVIS FINDINGS  Hepatobiliary: Normal liver, no suspicious focal lesion. No pericholecystic stranding. No biliary duct dilation.  Pancreas: Pancreas is normal.  Spleen: Plane is normal size and contour without focal lesion.  Adrenals/Urinary Tract: Adrenal glands are normal.  Symmetric renal enhancement without suspicious renal lesion.  Stomach/Bowel: Stomach under distended limiting assessment.  More dense stool and or soft tissue density at the ileocecal valve (image 79, series 2) bowel is otherwise unremarkable to the extent evaluated. Pelvic bowel loops are not imaged. No perienteric or pericolonic stranding.  Vascular/Lymphatic: Spider and noncalcified plaque, mild in the abdominal aorta. No aneurysmal dilation.  Enlarged lymph nodes in the hepatic gastric recess (image 55, series 2) 12 mm hepatic  gastric lymph node.  (Image 56, series 2) 8 mm hepatic gastric lymph node.  Small lymph node adjacent to the SMA origin  (image 64, series 2) 7 mm.  Other: No ascites.  Musculoskeletal: No acute musculoskeletal process. No destructive bone finding.  IMPRESSION: 1. Thickening of the gastro esophageal junction with mild surrounding stranding, presumably the site of known esophageal neoplasm. 2. Enlarged lymph nodes in the hepatogastric recess compatible local nodal disease. 3. Small lymph node adjacent to SMA origin suspicious given morphology, just above the level of the LEFT renal vein.. 4. More dense stool and or soft tissue density at the ileocecal valve. Recommend correlation with any recent colonoscopy results to exclude the possibility of concomitant colonic neoplasm.. 5. Small pulmonary nodules, nonspecific, attention on follow-up. 6. Aortic atherosclerosis.  These results will be called to the ordering clinician or representative by the Radiologist Assistant, and communication documented in the PACS or [employer].  Aortic Atherosclerosis (ICD10-I70.0).   Electronically Signed By: Zetta Bills M.D.  CLINICAL DATA: Initial treatment strategy for esophageal cancer.  EXAM: NUCLEAR MEDICINE PET SKULL BASE TO THIGH  TECHNIQUE: 12.4 mCi F-18 FDG was injected intravenously. Full-ring PET imaging was performed from the skull vertex to thigh after the radiotracer. CT data was obtained and used for attenuation correction and anatomic localization.  Fasting blood glucose: 106 mg/dl  COMPARISON: Multiple exams, including CT scan 05/21/2020 from [facility].  FINDINGS: Mediastinal blood pool activity: SUV max 2.0  Liver activity: SUV max 3.6  HEAD/NECK: Moderate misregistration in the head. Motion artifact in the head on the PET acquisition. This likely explains the reduced signal in the left cerebral hemisphere. No underlying abnormality in this region on the CT data.  Incidental CT findings: Chronic bilateral maxillary sinusitis, right greater than left.  CHEST:  Distal esophageal activity extending into the gastric cardia region, maximum SUV 12.3. Associated mild soft tissue thickening in the distal esophageal wall. No hypermetabolic adenopathy in the thorax.  Incidental CT findings: Left anterior descending and circumflex coronary artery atherosclerosis with mild right coronary artery atherosclerosis. Mild cardiomegaly.  0.6 by 0.3 cm right middle lobe pulmonary nodule, stable. The tiny lingular nodule seen on prior CT is poorly identified today.  ABDOMEN/PELVIS: Clustered right gastric lymph nodes including a 1.3 cm in short axis right gastric node on image 194/3, these demonstrate accentuated abnormal metabolic activity but are difficult to separate out from the adjacent gastric activity, maximum SUV is approximately 8.9. No hypermetabolic liver lesion or other findings of metastatic spread in the abdomen/pelvis.  No hypermetabolic activity in the ileocecal region where there was a question of soft tissue prominence on prior CT.  Incidental CT findings: Punctate 1-2 mm left mid kidney nonobstructive renal calculus on image 232/3. Similar punctate 1-2 mm right kidney lower pole calculus, image 231.  Aortoiliac atherosclerotic vascular disease. Prior left groin hernia repair. Mild prostatomegaly with dystrophic calcifications in the prostate gland.  SKELETON: No significant abnormal hypermetabolic activity in this region.  Incidental CT findings: Bridging spurring of both sacroiliac joints.  IMPRESSION: 1. Hypermetabolic gastroesophageal mass, maximum SUV 12.3. Immediately adjacent right gastric lymph nodes near the mass are thought to be hypermetabolic with maximum SUV 8.9. No other findings of metastatic spread. 2. Other imaging findings of potential clinical significance: Chronic bilateral maxillary sinusitis. Aortic Atherosclerosis (ICD10-I70.0). Coronary atherosclerosis. Mild cardiomegaly. Nonspecific 6 by 3 mm right middle  lobe pulmonary nodule, not appreciably hypermetabolic but below sensitive PET-CT size thresholds. Single punctate nonobstructive renal calculi. Mild prostatomegaly.  Electronically Signed By: Van Clines M.D. On: 06/02/2020 12:54 I have independently reviewed the above radiology studies  and reviewed the findings with the patient.    Recent Lab Findings: Lab Results  Component Value Date   WBC 8.2 09/16/2020   HGB 15.8 09/16/2020   HCT 47.4 09/16/2020   PLT 265 09/16/2020   GLUCOSE 107 (H) 09/16/2020   ALT 73 (H) 09/16/2020   AST 42 (H) 09/16/2020   NA 139 09/16/2020   K 4.5 09/16/2020   CL 108 09/16/2020   CREATININE 0.86 09/16/2020   BUN 20 09/16/2020   CO2 22 09/16/2020   INR 1.0 09/16/2020   Path : [place] 05/19/2020 [id number] Moderate  to poorly differentiated  adenocarcinoma distal esophagus        Assessment / Plan:   #1 advanced stage adenocarcinoma involving the lower esophagus from 30 cm with skip lesion to 40 cm-now status post 3 cycles of FOLFOX and radiation therapy with with significant response on restaging CT scan of the chest and abdomen but does not appear to be complete response.  Pretreatment patient was clinically staged at IVA -this is presuming a T3 lesion and N2 nodes without evidence of distant spread.  EUS was not used in this staging.  I discussed with the patient and wife  in detail the place for surgical treatment of esophageal cancer, including resection after preoperative chemo radiation.  He has showed a response, his overall health allows the consideration of esophageal resection.  We will plan removal of his PEG tube, and surgical resection of the esophagus with cervical esophagogastrostomy.  The magnitude of the operation risks involved and expectations have been discussed with the patient.  The typical  hospital course has been reviewed, including risks such as esophageal anastomotic leak, pneumonia, recurrent nerve  injury, pneumonia, pulmonary emboli, death have been discussed.  Patient has had his questions answered and is willing to proceed  The goals risks and alternatives of the planned surgical procedure Procedure(s): VIDEO BRONCHOSCOPY (N/A) Transhiatal total ESOPHAGECTOMY (N/A) JEJUNOSTOMY (N/A)  have been discussed with the patient . The risks of the procedure including death, infection, stroke, myocardial infarction, bleeding, blood transfusion have all been discussed specifically.   I had a detailed discussion with Lupe Carney regarding the magnitude of the surgical esophagectomy procedure as well as the risks, the expected benefits, and alternatives.  I quoted Ottie Glazier Stoermer 5% perioperative mortality rate and a complication rate as high as 40%.  We specifically discussed complications, which include, but were not limited to: recurrent nerve injury with possible permanent hoarseness, anastomotic leak, airway and great vessel injury, conduit ischemia, thoracic duct leak, the inability to complete the operation via a transhiatal approach requiring a right thoracotomy,  bleeding, need for blood transfusion and the potential need for ventilator support.  Herny Scurlock [facility] has had questions answered is well informed and willing to proceed.       Grace Isaac MD      [address] [place] [address] Office [phone]     09/20/2020 7:06 AM

## 2020-09-20 NOTE — Anesthesia Procedure Notes (Signed)
Central Venous Catheter Insertion Performed by: Myrtie Soman, MD, anesthesiologist Start/End11/11/2019 7:00 AM, 09/20/2020 7:14 AM Patient location: Pre-op. Preanesthetic checklist: patient identified, IV checked, site marked, risks and benefits discussed, surgical consent, monitors and equipment checked, pre-op evaluation, timeout performed and anesthesia consent Position: Trendelenburg Lidocaine 1% used for infiltration and patient sedated Hand hygiene performed  and maximum sterile barriers used  Catheter size: 8 Fr Total catheter length 16. Central line was placed.Double lumen Procedure performed using ultrasound guided technique. Ultrasound Notes:anatomy identified, needle tip was noted to be adjacent to the nerve/plexus identified, no ultrasound evidence of intravascular and/or intraneural injection and image(s) printed for medical record Attempts: 1 Following insertion, dressing applied, line sutured and Biopatch. Post procedure assessment: blood return through all ports  Patient tolerated the procedure well with no immediate complications.

## 2020-09-20 NOTE — Transfer of Care (Addendum)
Immediate Anesthesia Transfer of Care Note  Patient: Thomas Gonzalez [facility]  Procedure(s) Performed: VIDEO BRONCHOSCOPY (N/A ) Transhiatal total ESOPHAGECTOMY with Pyloroplasty (N/A Abdomen) JEJUNOSTOMY (N/A ) REMOVAL OF GASTROSTOMY TUBE  Patient Location: ICU  Anesthesia Type:General  Level of Consciousness: Patient remains intubated per anesthesia plan  Airway & Oxygen Therapy: Patient remains intubated per anesthesia plan and Patient placed on Ventilator (see vital sign flow sheet for setting)  Post-op Assessment: Report given to RN and Post -op Vital signs reviewed and stable  Post vital signs: Reviewed and stable  Last Vitals:  Vitals Value Taken Time  BP 131/66 (87)   Temp    Pulse 85 09/20/20 1530  Resp 12 09/20/20 1530  SpO2 99 % 09/20/20 1530  Vitals shown include unvalidated device data.  Last Pain:  Vitals:   09/20/20 0641  TempSrc:   PainSc: 0-No pain         Complications: No complications documented.

## 2020-09-20 NOTE — Anesthesia Procedure Notes (Deleted)
Performed by: Candis Shine, CRNA

## 2020-09-20 NOTE — Anesthesia Postprocedure Evaluation (Signed)
Anesthesia Post Note  Patient: Vaun Hyndman [facility]  Procedure(s) Performed: VIDEO BRONCHOSCOPY (N/A ) Transhiatal total ESOPHAGECTOMY with Pyloroplasty (N/A Abdomen) JEJUNOSTOMY (N/A ) REMOVAL OF GASTROSTOMY TUBE     Patient location during evaluation: SICU Anesthesia Type: General Level of consciousness: sedated Pain management: pain level controlled Vital Signs Assessment: post-procedure vital signs reviewed and stable Respiratory status: patient remains intubated per anesthesia plan Cardiovascular status: stable Postop Assessment: no apparent nausea or vomiting Anesthetic complications: no   No complications documented.  Last Vitals:  Vitals:   09/20/20 1525 09/20/20 1656  BP:    Pulse:  98  Resp:  19  Temp:    SpO2: 99% 97%    Last Pain:  Vitals:   09/20/20 0641  TempSrc:   PainSc: 0-No pain                 Denae Zulueta S

## 2020-09-20 NOTE — Anesthesia Procedure Notes (Addendum)
Procedure Name: Intubation Date/Time: 09/20/2020 7:43 AM Performed by: Dorthea Cove, CRNA Pre-anesthesia Checklist: Patient identified, Emergency Drugs available, Suction available and Patient being monitored Patient Re-evaluated:Patient Re-evaluated prior to induction Oxygen Delivery Method: Circle System Utilized Preoxygenation: Pre-oxygenation with 100% oxygen Induction Type: IV induction Ventilation: Mask ventilation without difficulty and Oral airway inserted - appropriate to patient size Laryngoscope Size: Glidescope and 4 Grade View: Grade I Tube type: Oral (NIMS) Tube size: 8.0 mm Number of attempts: 1 Airway Equipment and Method: Stylet,  Oral airway and Video-laryngoscopy Placement Confirmation: ETT inserted through vocal cords under direct vision,  positive ETCO2 and breath sounds checked- equal and bilateral Secured at: 21 cm Tube secured with: Tape Dental Injury: Teeth and Oropharynx as per pre-operative assessment

## 2020-09-21 ENCOUNTER — Encounter (HOSPITAL_COMMUNITY): Payer: Self-pay | Admitting: Cardiothoracic Surgery

## 2020-09-21 ENCOUNTER — Inpatient Hospital Stay (HOSPITAL_COMMUNITY): Payer: BC Managed Care – PPO

## 2020-09-21 LAB — GLUCOSE, CAPILLARY
Glucose-Capillary: 117 mg/dL — ABNORMAL HIGH (ref 70–99)
Glucose-Capillary: 126 mg/dL — ABNORMAL HIGH (ref 70–99)
Glucose-Capillary: 131 mg/dL — ABNORMAL HIGH (ref 70–99)
Glucose-Capillary: 131 mg/dL — ABNORMAL HIGH (ref 70–99)
Glucose-Capillary: 136 mg/dL — ABNORMAL HIGH (ref 70–99)
Glucose-Capillary: 137 mg/dL — ABNORMAL HIGH (ref 70–99)
Glucose-Capillary: 97 mg/dL (ref 70–99)

## 2020-09-21 LAB — CBC
HCT: 41.9 % (ref 39.0–52.0)
Hemoglobin: 14.4 g/dL (ref 13.0–17.0)
MCH: 32.5 pg (ref 26.0–34.0)
MCHC: 34.4 g/dL (ref 30.0–36.0)
MCV: 94.6 fL (ref 80.0–100.0)
Platelets: 212 10*3/uL (ref 150–400)
RBC: 4.43 MIL/uL (ref 4.22–5.81)
RDW: 16.3 % — ABNORMAL HIGH (ref 11.5–15.5)
WBC: 15.5 10*3/uL — ABNORMAL HIGH (ref 4.0–10.5)
nRBC: 0 % (ref 0.0–0.2)

## 2020-09-21 LAB — BLOOD GAS, ARTERIAL
Acid-Base Excess: 1 mmol/L (ref 0.0–2.0)
Bicarbonate: 25.3 mmol/L (ref 20.0–28.0)
Drawn by: 56149
FIO2: 28
O2 Saturation: 99.4 %
Patient temperature: 37
pCO2 arterial: 42.1 mmHg (ref 32.0–48.0)
pH, Arterial: 7.396 (ref 7.350–7.450)
pO2, Arterial: 220 mmHg — ABNORMAL HIGH (ref 83.0–108.0)

## 2020-09-21 LAB — BASIC METABOLIC PANEL
Anion gap: 6 (ref 5–15)
BUN: 11 mg/dL (ref 6–20)
CO2: 26 mmol/L (ref 22–32)
Calcium: 8.6 mg/dL — ABNORMAL LOW (ref 8.9–10.3)
Chloride: 103 mmol/L (ref 98–111)
Creatinine, Ser: 0.68 mg/dL (ref 0.61–1.24)
GFR, Estimated: >60 mL/min (ref >60)
Glucose, Bld: 138 mg/dL — ABNORMAL HIGH (ref 70–99)
Potassium: 3.9 mmol/L (ref 3.5–5.1)
Sodium: 135 mmol/L (ref 135–145)

## 2020-09-21 MED ORDER — ORAL CARE MOUTH RINSE
15.0000 mL | Freq: Two times a day (BID) | OROMUCOSAL | Status: DC
  Administered 2020-09-21: 15 mL via OROMUCOSAL

## 2020-09-21 MED ORDER — CHLORHEXIDINE GLUCONATE 0.12 % MT SOLN
15.0000 mL | Freq: Two times a day (BID) | OROMUCOSAL | Status: DC
  Administered 2020-09-21 – 2020-10-03 (×24): 15 mL via OROMUCOSAL
  Filled 2020-09-21 (×21): qty 15

## 2020-09-21 MED ORDER — DIPHENHYDRAMINE HCL 12.5 MG/5ML PO ELIX: 12.5000 mg | ORAL_SOLUTION | Freq: Four times a day (QID) | ORAL | Status: DC | PRN

## 2020-09-21 MED ORDER — ENOXAPARIN SODIUM 40 MG/0.4ML ~~LOC~~ SOLN
40.0000 mg | Freq: Every day | SUBCUTANEOUS | Status: DC
  Administered 2020-09-21 – 2020-10-06 (×16): 40 mg via SUBCUTANEOUS
  Filled 2020-09-21 (×16): qty 0.4

## 2020-09-21 MED ORDER — METOPROLOL TARTRATE 5 MG/5ML IV SOLN
5.0000 mg | Freq: Four times a day (QID) | INTRAVENOUS | Status: DC | PRN
  Administered 2020-09-26: 5 mg via INTRAVENOUS
  Filled 2020-09-21: qty 5

## 2020-09-21 MED ORDER — ORAL CARE MOUTH RINSE
15.0000 mL | Freq: Two times a day (BID) | OROMUCOSAL | Status: DC
  Administered 2020-09-22 – 2020-10-03 (×15): 15 mL via OROMUCOSAL

## 2020-09-21 MED ORDER — DIPHENHYDRAMINE HCL 50 MG/ML IJ SOLN: 12.5000 mg | Freq: Four times a day (QID) | INTRAMUSCULAR | Status: DC | PRN

## 2020-09-21 MED ORDER — SODIUM CHLORIDE 0.9% FLUSH: 9.0000 mL | INTRAVENOUS | Status: DC | PRN

## 2020-09-21 MED ORDER — ONDANSETRON HCL 4 MG/2ML IJ SOLN: 4.0000 mg | Freq: Four times a day (QID) | INTRAMUSCULAR | Status: DC | PRN

## 2020-09-21 MED ORDER — NALOXONE HCL 0.4 MG/ML IJ SOLN: 0.4000 mg | INTRAMUSCULAR | Status: DC | PRN

## 2020-09-21 MED ORDER — HYDROMORPHONE 1 MG/ML IV SOLN
INTRAVENOUS | Status: DC
  Administered 2020-09-22: 1.2 mg via INTRAVENOUS
  Administered 2020-09-22 (×2): 2 mg via INTRAVENOUS
  Administered 2020-09-22: 1.8 mg via INTRAVENOUS
  Administered 2020-09-22: 2.9 mg via INTRAVENOUS
  Administered 2020-09-22: 8 mg via INTRAVENOUS
  Administered 2020-09-23: 2 mg via INTRAVENOUS
  Administered 2020-09-23: 3.8 mg via INTRAVENOUS
  Administered 2020-09-23: 1.8 mg via INTRAVENOUS
  Administered 2020-09-23: 30 mg via INTRAVENOUS
  Administered 2020-09-24: 2.8 mg via INTRAVENOUS
  Administered 2020-09-25: 2.2 mg via INTRAVENOUS
  Administered 2020-09-25: 2.4 mg via INTRAVENOUS
  Administered 2020-09-25: 30 mg via INTRAVENOUS
  Administered 2020-09-25: 1 mg via INTRAVENOUS
  Administered 2020-09-25: 0.6 mg via INTRAVENOUS
  Administered 2020-09-26: 2.4 mg via INTRAVENOUS
  Administered 2020-09-26: 2.8 mg via INTRAVENOUS
  Administered 2020-09-26 – 2020-09-27 (×3): 1.8 mg via INTRAVENOUS
  Administered 2020-09-27: 30 mg via INTRAVENOUS
  Administered 2020-09-27: 6.2 mg via INTRAVENOUS
  Administered 2020-09-27: 1.8 mg via INTRAVENOUS
  Filled 2020-09-21 (×4): qty 30

## 2020-09-21 NOTE — Plan of Care (Signed)

## 2020-09-21 NOTE — Progress Notes (Signed)
Patient ID: Thomas Gonzalez, male   DOB: May 18, 1966, 54 y.o.   MRN: [id number] TCTS DAILY ICU PROGRESS NOTE                   [address]            [place] [address]          [phone]   1 Day Post-Op Procedure(s) (LRB): VIDEO BRONCHOSCOPY (N/A) Transhiatal total ESOPHAGECTOMY with Pyloroplasty (N/A) JEJUNOSTOMY (N/A) REMOVAL OF GASTROSTOMY TUBE  Total Length of Stay:  LOS: 1 day   Subjective: Patient awake alert sitting in chair, voices good, not hoarse and good pronunciation of "e" Some complaint of upper posterior back pain  Objective: Vital signs in last 24 hours: Temp:  [97.8 F (36.6 C)-99 F (37.2 C)] 99 F (37.2 C) (11/02 0700) Pulse Rate:  [85-107] 101 (11/02 0600) Cardiac Rhythm: Sinus tachycardia (11/02 0500) Resp:  [14-19] 17 (11/02 0600) BP: (137-155)/(87-99) 146/87 (11/02 0500) SpO2:  [93 %-99 %] 93 % (11/02 0600) Arterial Line BP: (101-169)/(67-117) 101/72 (11/02 0600) FiO2 (%):  [40 %-50 %] 40 % (11/01 1625) Weight:  [87.6 kg] 87.6 kg (11/02 0500)  Filed Weights   09/20/20 0617 09/21/20 0500  Weight: 88.5 kg 87.6 kg    Weight change: -0.851 kg   Hemodynamic parameters for last 24 hours:    Intake/Output from previous day: 11/01 0701 - 11/02 0700 In: 5558.7 [I.V.:4728.8; IV Piggyback:800] Out: 2550 [Urine:1850; Emesis/NG output:300; Blood:400]  Intake/Output this shift: No intake/output data recorded.  Current Meds: Scheduled Meds: . Chlorhexidine Gluconate Cloth  6 each Topical Daily  . enoxaparin (LOVENOX) injection  40 mg Subcutaneous Daily  . insulin aspart  0-24 Units Subcutaneous Q4H  . lidocaine  1 patch Transdermal Q24H  . mouth rinse  15 mL Mouth Rinse BID  . pantoprazole (PROTONIX) IV  40 mg Intravenous Q12H   Continuous Infusions: . dextrose 5 % and 0.9% NaCl 100 mL/hr at 09/21/20 0821   PRN Meds:.albuterol, metoprolol tartrate, morphine injection, ondansetron (ZOFRAN) IV, phenol, sodium chloride  flush  General appearance: alert and cooperative Neurologic: intact Heart: regular rate and rhythm, S1, S2 normal, no murmur, click, rub or gallop Lungs: diminished breath sounds bibasilar Abdomen: soft, non-tender; bowel sounds hypoactive; no masses,  no organomegaly Extremities: extremities normal, atraumatic, no cyanosis or edema and Homans sign is negative, no sign of DVT Wound: Dressings intact minimal drainage from left neck  Lab Results: CBC: Recent Labs    09/20/20 1614 09/20/20 1614 09/20/20 1645 09/21/20 0539  WBC 15.0*  --   --  15.5*  HGB 14.5   < > 13.9 14.4  HCT 41.6   < > 41.0 41.9  PLT 210  --   --  212   < > = values in this interval not displayed.   BMET:  Recent Labs    09/20/20 1614 09/20/20 1614 09/20/20 1645 09/21/20 0539  NA 137   < > 140 135  K 4.4   < > 3.9 3.9  CL 105  --   --  103  CO2 22  --   --  26  GLUCOSE 177*  --   --  138*  BUN 11  --   --  11  CREATININE 0.90  --   --  0.68  CALCIUM 8.6*  --   --  8.6*   < > = values in this interval not displayed.    CMET: Lab Results  Component Value Date   WBC 15.5 (H) 09/21/2020   HGB 14.4 09/21/2020   HCT 41.9 09/21/2020   PLT 212 09/21/2020   GLUCOSE 138 (H) 09/21/2020   ALT 73 (H) 09/16/2020   AST 42 (H) 09/16/2020   NA 135 09/21/2020   K 3.9 09/21/2020   CL 103 09/21/2020   CREATININE 0.68 09/21/2020   BUN 11 09/21/2020   CO2 26 09/21/2020   INR 1.0 09/16/2020      PT/INR: No results for input(s): LABPROT, INR in the last 72 hours. Radiology:  [facility] Chest Port 1 View  Result Date: 09/20/2020 CLINICAL DATA:  Esophagectomy. EXAM: PORTABLE CHEST 1 VIEW COMPARISON:  Earlier same day FINDINGS: Endotracheal tube is high, at the thoracic inlet, 10 cm above the carina. Orogastric or nasogastric tube tip is 5 cm below the diaphragm. Right internal jugular central line tip is in the SVC 4 cm above the right atrium. Right power port is unchanged with its tip 2 cm above the right atrium.  There is mild atelectasis at the right lung base and moderate atelectasis of the left lower lobe. No visible pneumothorax. IMPRESSION: Endotracheal tube high, 10 cm above the carina. Nasogastric tube tip 5 cm below the diaphragm. Right internal jugular central line tip in the SVC 4 cm above the right atrium. Bibasilar atelectasis, left greater than right. Electronically Signed   By: Nelson Chimes M.D.   On: 09/20/2020 16:16   DG Chest Portable 1 View  Result Date: 09/20/2020 CLINICAL DATA:  Post esophagectomy EXAM: PORTABLE CHEST 1 VIEW COMPARISON:  Earlier same day FINDINGS: Endotracheal tube is approximately 5 cm above the carina. Right IJ central line tip overlies SVC. Right chest wall port catheter is unchanged. Enteric tube is likely within gastric pull-through given history. No pneumothorax. Probable small bilateral pleural effusions. Bibasilar atelectasis. Normal heart size. IMPRESSION: Lines and tubes as above. No pneumothorax. Probable small bilateral pleural effusions with bibasilar atelectasis. Electronically Signed   By: Macy Mis M.D.   On: 09/20/2020 15:34   Xray this am reviewed , no need for left chest tube at this point    Assessment/Plan: S/P Procedure(s) (LRB): VIDEO BRONCHOSCOPY (N/A) Transhiatal total ESOPHAGECTOMY with Pyloroplasty (N/A) JEJUNOSTOMY (N/A) REMOVAL OF GASTROSTOMY TUBE Mobilize Expected Acute  Blood - loss Anemia- continue to monitor  DVT prophylaxis with, PAS, and subcu Lovenox and ambulation No evidence of recurrent nerve injury We will start trickle tube feedings tomorrow   Grace Isaac 09/21/2020 8:40 AM

## 2020-09-21 NOTE — Progress Notes (Signed)
CRITICAL VALUE ALERT  Critical Value:  Radiology called to report possible pneumothorax   Date & Time Notied: 09/21/2020 0845  Provider Notified: Servando Snare  Orders Received/Actions taken: no new orders at this time

## 2020-09-21 NOTE — Progress Notes (Signed)
TCTS Evening Rounds  POD #1 s/p esophagectomy HD stable Pain well controlled OOB to chair BP 136/82    Pulse 99    Temp 99.6 F (37.6 C)    Resp 20    Ht 5\' 9"  (1.753 m)    Wt 87.6 kg    SpO2 97%    BMI 28.52 kg/m   Alert/oriented  A/p: continue present management. Thomas Gonzalez Z. Orvan Seen, [place]

## 2020-09-21 NOTE — Progress Notes (Signed)
Initial Nutrition Assessment  DOCUMENTATION CODES:   Not applicable  INTERVENTION:   Initiate tomorrow:  -Osmolite 1.5 @ 20 ml/hr via J-tube -Increase by 10 ml Q4 hours to goal rate of 65 ml/hr (1560 ml) -45 ml ProSource TID  Provides: 2460 kcals, 138 grams protein, 1189 ml free water.   NUTRITION DIAGNOSIS:   Increased nutrient needs related to post-op healing as evidenced by estimated needs.  GOAL:   Patient will meet greater than or equal to 90% of their needs  MONITOR:   Diet advancement, Skin, TF tolerance, Weight trends, Labs, I & O's  REASON FOR ASSESSMENT:   Rounds    ASSESSMENT:   Patient with PMH significant for bowel obstrcution, GERD, adenocarcinoma of the distal esophagus, and s/p PEG. Presents this admission for surgical management of distal esophagus adenocarcinoma.   11/1- total esophagectomy with pyloroplasty, removal G-tube, placement of J-tube  Pt denies loss in appetite PTA. States when he was diagnosed with esophageal cancer he decreased his meal sizes in attempts to lose weight. He typically consumed small meals every two hours that consisted of cereal, yogurt, hot pockets, crackers, and protein bars. Denies issues with chewing or swallowing. Currently NPO. Plan to start tube feeding tomorrow per TCTS.   Pt endorses a UBW of 220 lb and an intentional wt loss of 30 lb in the last year. Records indicate pt weighed 226 lb on 3/22 and 193 lb this admission.   Drips: D5 in NS @ 100 ml/hr  Medications: SS novolog Labs: CBG 126-177  NUTRITION - FOCUSED PHYSICAL EXAM:    Most Recent Value  Orbital Region No depletion  Upper Arm Region No depletion  Thoracic and Lumbar Region Unable to assess  Buccal Region No depletion  Temple Region Mild depletion  Clavicle Bone Region No depletion  Clavicle and Acromion Bone Region No depletion  Scapular Bone Region Unable to assess  Dorsal Hand No depletion  Patellar Region No depletion  Anterior Thigh  Region No depletion  Posterior Calf Region No depletion  Edema (RD Assessment) Mild  Hair Reviewed  Eyes Reviewed  Mouth Reviewed  Skin Reviewed  Nails Reviewed     Diet Order:   Diet Order            Diet NPO time specified  Diet effective now                 EDUCATION NEEDS:   Education needs have been addressed  Skin:  Skin Assessment: Skin Integrity Issues: Skin Integrity Issues:: Incisions Incisions: L neck, R abdomen  Last BM:  PTA  Height:   Ht Readings from Last 1 Encounters:  09/21/20 5\' 9"  (1.753 m)    Weight:   Wt Readings from Last 1 Encounters:  09/21/20 87.6 kg    BMI:  Body mass index is 28.52 kg/m.  Estimated Nutritional Needs:   Kcal:  2450-2750 kcal  Protein:  125-145 g  Fluid:  >/= 2 L/day   Mariana Single RD, LDN Clinical Nutrition Pager listed in [place]

## 2020-09-22 ENCOUNTER — Inpatient Hospital Stay (HOSPITAL_COMMUNITY): Payer: BC Managed Care – PPO

## 2020-09-22 LAB — GLUCOSE, CAPILLARY
Glucose-Capillary: 120 mg/dL — ABNORMAL HIGH (ref 70–99)
Glucose-Capillary: 122 mg/dL — ABNORMAL HIGH (ref 70–99)
Glucose-Capillary: 126 mg/dL — ABNORMAL HIGH (ref 70–99)
Glucose-Capillary: 133 mg/dL — ABNORMAL HIGH (ref 70–99)
Glucose-Capillary: 133 mg/dL — ABNORMAL HIGH (ref 70–99)
Glucose-Capillary: 146 mg/dL — ABNORMAL HIGH (ref 70–99)

## 2020-09-22 LAB — CBC
HCT: 40.6 % (ref 39.0–52.0)
Hemoglobin: 13.4 g/dL (ref 13.0–17.0)
MCH: 32.1 pg (ref 26.0–34.0)
MCHC: 33 g/dL (ref 30.0–36.0)
MCV: 97.1 fL (ref 80.0–100.0)
Platelets: 178 10*3/uL (ref 150–400)
RBC: 4.18 MIL/uL — ABNORMAL LOW (ref 4.22–5.81)
RDW: 16.2 % — ABNORMAL HIGH (ref 11.5–15.5)
WBC: 12.3 10*3/uL — ABNORMAL HIGH (ref 4.0–10.5)
nRBC: 0 % (ref 0.0–0.2)

## 2020-09-22 LAB — BASIC METABOLIC PANEL
Anion gap: 7 (ref 5–15)
BUN: 12 mg/dL (ref 6–20)
CO2: 25 mmol/L (ref 22–32)
Calcium: 8.4 mg/dL — ABNORMAL LOW (ref 8.9–10.3)
Chloride: 103 mmol/L (ref 98–111)
Creatinine, Ser: 0.7 mg/dL (ref 0.61–1.24)
GFR, Estimated: >60 mL/min (ref >60)
Glucose, Bld: 123 mg/dL — ABNORMAL HIGH (ref 70–99)
Potassium: 4.1 mmol/L (ref 3.5–5.1)
Sodium: 135 mmol/L (ref 135–145)

## 2020-09-22 MED ORDER — CHLORHEXIDINE GLUCONATE CLOTH 2 % EX PADS
6.0000 | MEDICATED_PAD | Freq: Every day | CUTANEOUS | Status: DC
  Administered 2020-09-23 – 2020-10-03 (×10): 6 via TOPICAL

## 2020-09-22 MED ORDER — OSMOLITE 1.5 CAL PO LIQD
1000.0000 mL | ORAL | Status: DC
  Administered 2020-09-22 – 2020-09-23 (×2): 1000 mL

## 2020-09-22 MED ORDER — OSMOLITE 1.5 CAL PO LIQD
1000.0000 mL | ORAL | Status: DC
Start: 2020-09-22 — End: 2020-09-22

## 2020-09-22 MED ORDER — PROSOURCE TF PO LIQD
45.0000 mL | Freq: Three times a day (TID) | ORAL | Status: DC
  Administered 2020-09-22 – 2020-10-06 (×41): 45 mL
  Filled 2020-09-22 (×42): qty 45

## 2020-09-22 MED ORDER — OSMOLITE 1.2 CAL PO LIQD
1000.0000 mL | ORAL | Status: DC
  Filled 2020-09-22: qty 1000

## 2020-09-22 NOTE — Progress Notes (Signed)
Brief Nutrition Follow Up  RD consulted to initiate tube feeding. Okay to start trickles per TCTS. See full assessment from 11/2.   Tube feeding: -Osmolite 1.5 @ 20 ml/hr via J-tube -Increase per surgery to goal rate of 65 ml/hr (1560 ml) -45 ml ProSource TID  Provides: 2460 kcals, 138 grams protein, 1189 ml free water.   May transition to nocturnal prior to d/c.   Mariana Single RD, LDN Clinical Nutrition Pager listed in [place]

## 2020-09-22 NOTE — Progress Notes (Signed)
      [address]       [place] [address]             [phone]      POD # 2 transhiatal esophagectomy  Sleeping  BP 118/83   Pulse (!) 103   Temp 100.3 F (37.9 C) (Oral)   Resp 16   Ht 5\' 9"  (1.753 m)   Wt 87.6 kg   SpO2 93%   BMI 28.52 kg/m   Intake/Output Summary (Last 24 hours) at 09/22/2020 1815 Last data filed at 09/22/2020 1400 Gross per 24 hour  Intake 1903.77 ml  Output 1755 ml  Net 148.77 ml   Tolerating trickle feeds  Remo Lipps C. Roxan Hockey, MD [facility] [phone]

## 2020-09-22 NOTE — Op Note (Signed)
Thomas Gonzalez, Thomas Gonzalez [facility] MEDICAL RECORD [id number] ACCOUNT [id number] DATE OF BIRTH:May 09, 1966 FACILITY: [facility] LOCATION: [place], [place]  OPERATIVE REPORT  DATE OF PROCEDURE:  09/20/2020  PREOPERATIVE DIAGNOSIS:  Adenocarcinoma of the distal esophagus, gastroesophageal junction.  POSTOPERATIVE DIAGNOSIS:  Adenocarcinoma of the distal esophagus, gastroesophageal junction.  SURGICAL PROCEDURE:  Video bronchoscopy, transhiatal total esophagectomy with cervical esophagogastrotomy, pyloroplasty, placement of feeding jejunostomy tube, removal of PEG tube.  SURGEON:  Lanelle Bal, MD  FIRST ASSISTANT:  Dr. Kipp Brood  SECOND ASSISTANT:  Jadene Pierini, [place]  BRIEF HISTORY:  The patient is a 54 year old male who was diagnosed in June 2021 in [place] of adenocarcinoma of the distal esophagus with 2 sites biopsied at 34 and 40 cm.  The patient had PET scan, CT evidence of stage IVA disease, though staging was  not based on esophageal ultrasound.  He underwent a course of FOLFOX and radiation treatment.  A PEG tube in the mid upper abdomen had been placed through the patient notes that it was never used and he was never obstructed.  He was then referred to  consider surgical resection.  Repeat CT scan of the chest and abdomen was done for restaging.  There appeared on CT scan to be a response, but still some residual disease noted.  Risks and options of surgical resection were discussed with the patient and  his wife in detail and he was willing to proceed.  DESCRIPTION OF PROCEDURE:  The patient underwent general endotracheal anesthesia without incident.  A single lumen NIMS tube had been placed.  Appropriate timeout was performed and we proceeded with video bronchoscopy to the subsegmental level in both  the left and right tracheobronchial tree, but also paying close attention to the trachea.  There were no endobronchial lesions or tracheal lesions noted.  The scope was  then removed.  We then continued prepping the patient with the head turned slightly  to the right.  The left neck, chest and abdomen were prepped with Betadine, draped in a sterile manner.  Prior to prepping and draping the patient's PEG tube had been removed.  A single nylon stitch was placed in the opening to prevent any drainage.  A  2nd timeout was performed after the patient was prepped and sterilely draped.  We then entered the abdominal cavity in the midline below the site of the PEG tube, knowing that the stomach would be adhered to the abdominal wall in the midline.  On  entering the abdominal cavity, cavity was explored.  There was no evidence of carcinomatosis or other significant metastatic disease in the abdomen.  The site of the adhesion of the stomach to the abdominal wall was identified.  This was dissected free  to get around it and then a purple load Endo stapler was used to staple across the fistulous tract to the upper abdominal wall.  The residual stomach in this was debrided.  With the stomach now freed, we then extended the excision superiorly upper arm  retractor was placed.  We initially started our dissection at the GE junction, identifying the right and left diaphragmatic crus.  The esophagus at the GE junction was dissected free from surrounding tissue without evidence of extension of tumor.  A  large Penrose drain was placed around the GE junction.  We then, using the Ethicon EnSeal moved along the short gastric vessels dividing them serially.  With the fundus of the stomach freed from the spleen we then lifted the greater curve of the  stomach,  entering into the lesser sac.  In the lesser sac we were able to identify the coronary vein and the left gastric artery.  Vessel loop was placed around the vessels.  A bulldog was placed on the left gastric artery.  With this occluded there was still an  easily palpable pulse along the right gastroepiploic vessel.  We then continued  freeing the greater curve of the stomach until this was free.  With the GE junction free and the fundus of the stomach and greater curve taken down we then proceeded with  the transhiatal portion of the dissection.  Using Harmonic scalpel, we dissected up along the esophagus into the chest with the transhiatal approach, dividing as many of the small branches as we could identify with the Harmonic scalpel.  With the  dissection extended up to at least the level of the tracheal bifurcation we then moved to the left neck.  An incision was made along the sternocleidomastoid muscle on the left, retracting the muscle, jugular vein, and carotid artery laterally and  dissecting down to the pretracheal fascia.  We used the NIMS tube to assist in our identification and confirmation of the position of the recurrent nerve.  The cervical esophagus was encircled and a small Penrose was placed.  With this freed, we then  continued our dissection, the transhiatal portion both working from below and above.  With the esophagus freely movable we had our anesthesiologist, Dr. [name] pull back the NG tube into the cervical esophagus.  Esophagus was then divided with a linear  stapler and the specimen delivered into the abdomen.  We then completed the dissection along the lesser curve by stapling across the coronary vein and left gastric artery, preserving the lesser curve arcade as much as possible.  This freed the specimen  further.  We then used the linear staple and divided along the stomach at approximately 4 cm from the GE junction, creating a gastric tube.  With this completed, we then took the specimen off the table and then opened the specimen away from the staple  line.  Before we sent frozen section on this, we decided to take a further gastric margin, especially along the lesser curve because of irregularities noted in the resected specimen.  This final gastric margin was labeled as such and submitted for frozen   section.  Final gastric margin was negative for tumor.  We then oversewed the staple line with a running 4-0 Prolene suture.  We turned our attention to the pylorus and a pyloroplasty was performed dividing the pylorus, pyloric muscle, and then closing  the opening with interrupted Vicryl sutures and 3-0 silk sutures as a 2nd layer of closure.  With the GE junction and the gastric tube created, we then passed a Foley catheter through the posterior mediastinum.  Used a telescope bag to pass the stomach  attached to the Foley catheter through the posterior mediastinum into the left neck incision.  There was easily sufficient length.  We removed the telescope bag, manipulated the cervical esophagus to approximate it to the gastric tube away from the tip  of the fundus.  The esophagus staple line was then removed and a small opening in the stomach was created.  Using a 30 cm Endo stapler with 1 arm in the esophagus and 1 in the stomach, the posterior lateral sides of the anastomosis were performed.  The  NG tube was then manipulated by the anesthesiologist and passed across the anastomosis and  into the gastric conduit.  We then closed the anterior portion of the anastomosis with interrupted Vicryl sutures and 2nd layer of interrupted 3-0 silk sutures.  A  Penrose drain was then placed anterior and posterior to the anastomosis and brought out through a separate site in the left neck.  We then turned our attention back to the abdomen, closing and tacking the stomach to the esophageal hiatus to prevent  herniation into the chest.  We had no obvious opening into the left or right pleural space identified.  A chest x-ray at the completion of the case was done while still in the room to confirm this.  We then identified the ligament of Treitz, moved about  10-12 cm distal to this and along the antimesenteric border of the jejunum, placed a pursestring suture.  Through this, we passed an 55 French red rubber catheter  with the tip removed and multiple side holes added positioned into the jejunum.  This was  then witzeled in place with interrupted 3-0 silk sutures and then brought out through a separate stab site on the left abdominal wall.  The jejunum was then tacked to the anterior abdominal wall in such a way as to prevent it from torquing.  The abdomen  was irrigated.  We then closed the incisions with a #1 Prolene suture, closing from the upper end and also a 2nd suture from the lower end.  The sponge and needle was reported as correct.  The abdominal cavity was then completely closed with running 2-0  Vicryl suture and subcutaneous tissue and a 3-0 subcuticular stitch in skin edges.  In a similar fashion the left neck incision was closed with a deeper fascial layer along the sternocleidomastoid muscle with interrupted 3-0 Vicryl sutures.  Platysma was  then reapproximated with a running 3-0 Vicryl, 4-0 subcuticular stitch in skin edges.  The jejunal feeding tube and Penrose drain were secured with nylon sutures.  As noted, the sponge and needle count was reported as correct at completion of the  procedure.  A chest x-ray was obtained in the operating room without evidence of significant left pleural effusion or pneumothorax.  Estimated blood loss was approximately 300-350 mL.  The patient tolerated the procedure without obvious complication and  was transferred to surgical intensive care unit for further postoperative care.  CN/NUANCE  D:09/22/2020 T:09/22/2020 JOB:[id number]

## 2020-09-22 NOTE — Progress Notes (Addendum)
[address]       [place] [address]             [phone]      2 Days Post-Op Procedure(s) (LRB): VIDEO BRONCHOSCOPY (N/A) Transhiatal total ESOPHAGECTOMY with Pyloroplasty (N/A) JEJUNOSTOMY (N/A) REMOVAL OF GASTROSTOMY TUBE Subjective: Pain better controlled with PCA  Objective: Vital signs in last 24 hours: Temp:  [98.4 F (36.9 C)-99.6 F (37.6 C)] 98.9 F (37.2 C) (11/03 0747) Pulse Rate:  [95-112] 99 (11/03 0700) Cardiac Rhythm: Normal sinus rhythm (11/03 0600) Resp:  [12-28] 12 (11/03 0700) BP: (118-159)/(82-99) 139/95 (11/03 0700) SpO2:  [93 %-97 %] 95 % (11/03 0700) Arterial Line BP: (100-153)/(75-77) 100/77 (11/02 1100)  Hemodynamic parameters for last 24 hours:    Intake/Output from previous day: 11/02 0701 - 11/03 0700 In: 2609.9 [I.V.:2499.9; IV Piggyback:50] Out: 6720 [Urine:1020; Emesis/NG output:200] Intake/Output this shift: No intake/output data recorded.  General appearance: alert, cooperative and no distress Heart: regular rate and rhythm Lungs: mildly dim in bases Abdomen: benign Extremities: PAS in place Wound: dressings intact  Lab Results: Recent Labs    09/21/20 0539 09/22/20 0258  WBC 15.5* 12.3*  HGB 14.4 13.4  HCT 41.9 40.6  PLT 212 178   BMET:  Recent Labs    09/21/20 0539 09/22/20 0258  NA 135 135  K 3.9 4.1  CL 103 103  CO2 26 25  GLUCOSE 138* 123*  BUN 11 12  CREATININE 0.68 0.70  CALCIUM 8.6* 8.4*    PT/INR: No results for input(s): LABPROT, INR in the last 72 hours. ABG    Component Value Date/Time   PHART 7.396 09/21/2020 0530   HCO3 25.3 09/21/2020 0530   TCO2 24 09/20/2020 1645   ACIDBASEDEF 2.0 09/20/2020 1645   O2SAT 99.4 09/21/2020 0530   CBG (last 3)  Recent Labs    09/21/20 2328 09/22/20 0335 09/22/20 0745  GLUCAP 137* 120* 126*    Meds Scheduled Meds: . chlorhexidine  15 mL Mouth Rinse BID  . Chlorhexidine Gluconate Cloth  6 each Topical Daily  . enoxaparin  (LOVENOX) injection  40 mg Subcutaneous Daily  . HYDROmorphone   Intravenous Q4H  . insulin aspart  0-24 Units Subcutaneous Q4H  . lidocaine  1 patch Transdermal Q24H  . mouth rinse  15 mL Mouth Rinse q12n4p  . pantoprazole (PROTONIX) IV  40 mg Intravenous Q12H   Continuous Infusions: . dextrose 5 % and 0.9% NaCl 100 mL/hr at 09/22/20 0700   PRN Meds:.albuterol, diphenhydrAMINE **OR** diphenhydrAMINE, metoprolol tartrate, naloxone **AND** sodium chloride flush, ondansetron (ZOFRAN) IV, phenol, sodium chloride flush  Xrays DG Chest Port 1 View  Result Date: 09/22/2020 CLINICAL DATA:  Esophagectomy, chest soreness. EXAM: PORTABLE CHEST 1 VIEW COMPARISON:  09/21/2020. FINDINGS: Esophagectomy. Nasogastric tube traverses the gastric pull-through, terminating in the epigastric region. Right IJ central line and right-sided power port tips terminate in the low SVC. Heart is enlarged, stable. Lungs are somewhat low in volume with bibasilar airspace opacification, left greater than right. Lucency is seen along the left heart border and likely left apex. Small left pleural effusion. Probable subcutaneous emphysema along the lower chest wall bilaterally. Lucency beneath the right hemidiaphragm, seen on yesterday's exam, is less well appreciated currently. IMPRESSION: 1. Moderate left hydropneumothorax, stable. 2. Bibasilar airspace opacification, left greater than right, likely due to atelectasis. Electronically Signed   By: Lorin Picket M.D.   On: 09/22/2020 08:17   DG Chest [facility]  Result Date: 09/21/2020 CLINICAL DATA:  Weakness.  Status post esophagectomy. EXAM: PORTABLE CHEST 1 VIEW COMPARISON:  09/20/2020. FINDINGS: Interim removal of endotracheal tube. NG tube, right IJ line, right PowerPort catheter in stable position. Heart size stable. Progressive bibasilar atelectasis/infiltrates. Small left pleural effusion. Probable small left apical pneumothorax. Small amount of free air under the right  hemidiaphragm cannot be excluded. This could be from recent surgery. IMPRESSION: 1. Interim removal of endotracheal tube. 2. NG tube, right IJ line right PowerPort catheter in stable position. 3. Progressive bibasilar atelectasis/infiltrates. Small left pleural effusion. 4. Probable small left apical pneumothorax. Small amount of air under the right hemidiaphragm. This could be from recent surgery. Critical Value/emergent results were called by telephone at the time of interpretation on 09/21/2020 at 8:22 am to nurse Alissa, who verbally acknowledged these results. Electronically Signed   By: Marcello Moores  Register   On: 09/21/2020 08:26   DG Chest Port 1 View  Result Date: 09/20/2020 CLINICAL DATA:  Esophagectomy. EXAM: PORTABLE CHEST 1 VIEW COMPARISON:  Earlier same day FINDINGS: Endotracheal tube is high, at the thoracic inlet, 10 cm above the carina. Orogastric or nasogastric tube tip is 5 cm below the diaphragm. Right internal jugular central line tip is in the SVC 4 cm above the right atrium. Right power port is unchanged with its tip 2 cm above the right atrium. There is mild atelectasis at the right lung base and moderate atelectasis of the left lower lobe. No visible pneumothorax. IMPRESSION: Endotracheal tube high, 10 cm above the carina. Nasogastric tube tip 5 cm below the diaphragm. Right internal jugular central line tip in the SVC 4 cm above the right atrium. Bibasilar atelectasis, left greater than right. Electronically Signed   By: Nelson Chimes M.D.   On: 09/20/2020 16:16   DG Chest Portable 1 View  Result Date: 09/20/2020 CLINICAL DATA:  Post esophagectomy EXAM: PORTABLE CHEST 1 VIEW COMPARISON:  Earlier same day FINDINGS: Endotracheal tube is approximately 5 cm above the carina. Right IJ central line tip overlies SVC. Right chest wall port catheter is unchanged. Enteric tube is likely within gastric pull-through given history. No pneumothorax. Probable small bilateral pleural effusions. Bibasilar  atelectasis. Normal heart size. IMPRESSION: Lines and tubes as above. No pneumothorax. Probable small bilateral pleural effusions with bibasilar atelectasis. Electronically Signed   By: Macy Mis M.D.   On: 09/20/2020 15:34    Assessment/Plan: S/P Procedure(s) (LRB): VIDEO BRONCHOSCOPY (N/A) Transhiatal total ESOPHAGECTOMY with Pyloroplasty (N/A) JEJUNOSTOMY (N/A) REMOVAL OF GASTROSTOMY TUBE  1 tmax 99.6, VSS, some systolic htn 2 sats ok on 3 liters [place] 3 on PCA for pain control 4 not anemic 5 leukocytosis trend improved 6 normal renal fxn 7 CXR - pntx is stable/small 8 poss start trickle tf's 9 d/c foley  LOS: 2 days    John Giovanni PA-C Pager [phone] 09/22/2020  Final path pending Pain better control No neck drainage Start trickle tube feeding I have seen and examined Lupe Carney and agree with the above assessment  and plan.  Grace Isaac MD Beeper [phone] Office [phone] 09/22/2020 8:54 AM

## 2020-09-23 ENCOUNTER — Inpatient Hospital Stay (HOSPITAL_COMMUNITY): Payer: BC Managed Care – PPO

## 2020-09-23 LAB — GLUCOSE, CAPILLARY
Glucose-Capillary: 124 mg/dL — ABNORMAL HIGH (ref 70–99)
Glucose-Capillary: 120 mg/dL — ABNORMAL HIGH (ref 70–99)
Glucose-Capillary: 129 mg/dL — ABNORMAL HIGH (ref 70–99)
Glucose-Capillary: 107 mg/dL — ABNORMAL HIGH (ref 70–99)
Glucose-Capillary: 134 mg/dL — ABNORMAL HIGH (ref 70–99)
Glucose-Capillary: 124 mg/dL — ABNORMAL HIGH (ref 70–99)

## 2020-09-23 LAB — SURGICAL PATHOLOGY

## 2020-09-23 LAB — BASIC METABOLIC PANEL
Anion gap: 7 (ref 5–15)
BUN: 11 mg/dL (ref 6–20)
CO2: 25 mmol/L (ref 22–32)
Calcium: 8.2 mg/dL — ABNORMAL LOW (ref 8.9–10.3)
Chloride: 103 mmol/L (ref 98–111)
Creatinine, Ser: 0.6 mg/dL — ABNORMAL LOW (ref 0.61–1.24)
GFR, Estimated: >60 mL/min (ref >60)
Glucose, Bld: 129 mg/dL — ABNORMAL HIGH (ref 70–99)
Potassium: 3.8 mmol/L (ref 3.5–5.1)
Sodium: 135 mmol/L (ref 135–145)

## 2020-09-23 LAB — CBC
HCT: 38.4 % — ABNORMAL LOW (ref 39.0–52.0)
Hemoglobin: 13 g/dL (ref 13.0–17.0)
MCH: 32.6 pg (ref 26.0–34.0)
MCHC: 33.9 g/dL (ref 30.0–36.0)
MCV: 96.2 fL (ref 80.0–100.0)
Platelets: 163 10*3/uL (ref 150–400)
RBC: 3.99 MIL/uL — ABNORMAL LOW (ref 4.22–5.81)
RDW: 15.4 % (ref 11.5–15.5)
WBC: 10.6 10*3/uL — ABNORMAL HIGH (ref 4.0–10.5)
nRBC: 0 % (ref 0.0–0.2)

## 2020-09-23 NOTE — Progress Notes (Addendum)
[address]       [place], [address]             [phone]      3 Days Post-Op Procedure(s) (LRB): VIDEO BRONCHOSCOPY (N/A) Transhiatal total ESOPHAGECTOMY with Pyloroplasty (N/A) JEJUNOSTOMY (N/A) REMOVAL OF GASTROSTOMY TUBE Subjective: Slowly feeling better  Objective: Vital signs in last 24 hours: Temp:  [98.4 F (36.9 C)-100.3 F (37.9 C)] 98.4 F (36.9 C) (11/04 0343) Pulse Rate:  [87-106] 91 (11/04 0700) Cardiac Rhythm: Normal sinus rhythm (11/04 0400) Resp:  [12-22] 17 (11/04 0700) BP: (118-155)/(82-97) 139/89 (11/04 0700) SpO2:  [89 %-96 %] 92 % (11/04 0700)  Hemodynamic parameters for last 24 hours:    Intake/Output from previous day: 11/03 0701 - 11/04 0700 In: 2571.7 [I.V.:1999.7; NG/GT:422] Out: 1650 [Urine:1400; Emesis/NG output:250] Intake/Output this shift: No intake/output data recorded.  General appearance: alert, cooperative and no distress Heart: regular rate and rhythm Lungs: dim in the bases Abdomen: benign Extremities: no edema or calf tenderness Wound: dressings in place  Lab Results: Recent Labs    09/22/20 0258 09/23/20 0034  WBC 12.3* 10.6*  HGB 13.4 13.0  HCT 40.6 38.4*  PLT 178 163   BMET:  Recent Labs    09/22/20 0258 09/23/20 0034  NA 135 135  K 4.1 3.8  CL 103 103  CO2 25 25  GLUCOSE 123* 129*  BUN 12 11  CREATININE 0.70 0.60*  CALCIUM 8.4* 8.2*    PT/INR: No results for input(s): LABPROT, INR in the last 72 hours. ABG    Component Value Date/Time   PHART 7.396 09/21/2020 0530   HCO3 25.3 09/21/2020 0530   TCO2 24 09/20/2020 1645   ACIDBASEDEF 2.0 09/20/2020 1645   O2SAT 99.4 09/21/2020 0530   CBG (last 3)  Recent Labs    09/22/20 1953 09/22/20 2348 09/23/20 0341  GLUCAP 133* 122* 124*    Meds Scheduled Meds: . chlorhexidine  15 mL Mouth Rinse BID  . Chlorhexidine Gluconate Cloth  6 each Topical Daily  . enoxaparin (LOVENOX) injection  40 mg Subcutaneous Daily  .  feeding supplement (PROSource TF)  45 mL Per Tube TID  . HYDROmorphone   Intravenous Q4H  . insulin aspart  0-24 Units Subcutaneous Q4H  . lidocaine  1 patch Transdermal Q24H  . mouth rinse  15 mL Mouth Rinse q12n4p  . pantoprazole (PROTONIX) IV  40 mg Intravenous Q12H   Continuous Infusions: . dextrose 5 % and 0.9% NaCl 100 mL/hr at 09/23/20 0700  . feeding supplement (OSMOLITE 1.5 CAL) 1,000 mL (09/23/20 0425)   PRN Meds:.diphenhydrAMINE **OR** diphenhydrAMINE, metoprolol tartrate, naloxone **AND** sodium chloride flush, ondansetron (ZOFRAN) IV, phenol  Xrays DG Chest Port 1 View  Result Date: 09/23/2020 CLINICAL DATA:  Sore chest status post esophagectomy. EXAM: PORTABLE CHEST 1 VIEW COMPARISON:  09/22/2020. FINDINGS: Interim removal of right IJ line. PowerPort catheter stable position. NG tube stable position. Surgical drainage left upper chest. Stable cardiomegaly. Persistent bibasilar atelectasis, left side greater than right again noted. Tiny left pleural effusion cannot be excluded. No definite pneumothorax noted on today's exam. IMPRESSION: 1. Interim removal of right IJ line. PowerPort catheter and NG tube stable position. 2. No definite pneumothorax noted on today's exam. 3. Persistent bibasilar atelectasis, left side greater than right. Electronically Signed   By: Marcello Moores  Register   On: 09/23/2020 06:08   DG Chest Port 1 View  Result Date: 09/22/2020 CLINICAL DATA:  Esophagectomy, chest soreness. EXAM: PORTABLE  CHEST 1 VIEW COMPARISON:  09/21/2020. FINDINGS: Esophagectomy. Nasogastric tube traverses the gastric pull-through, terminating in the epigastric region. Right IJ central line and right-sided power port tips terminate in the low SVC. Heart is enlarged, stable. Lungs are somewhat low in volume with bibasilar airspace opacification, left greater than right. Lucency is seen along the left heart border and likely left apex. Small left pleural effusion. Probable subcutaneous  emphysema along the lower chest wall bilaterally. Lucency beneath the right hemidiaphragm, seen on yesterday's exam, is less well appreciated currently. IMPRESSION: 1. Moderate left hydropneumothorax, stable. 2. Bibasilar airspace opacification, left greater than right, likely due to atelectasis. Electronically Signed   By: Lorin Picket M.D.   On: 09/22/2020 08:17   DG Chest Port 1 View  Result Date: 09/21/2020 CLINICAL DATA:  Weakness.  Status post esophagectomy. EXAM: PORTABLE CHEST 1 VIEW COMPARISON:  09/20/2020. FINDINGS: Interim removal of endotracheal tube. NG tube, right IJ line, right PowerPort catheter in stable position. Heart size stable. Progressive bibasilar atelectasis/infiltrates. Small left pleural effusion. Probable small left apical pneumothorax. Small amount of free air under the right hemidiaphragm cannot be excluded. This could be from recent surgery. IMPRESSION: 1. Interim removal of endotracheal tube. 2. NG tube, right IJ line right PowerPort catheter in stable position. 3. Progressive bibasilar atelectasis/infiltrates. Small left pleural effusion. 4. Probable small left apical pneumothorax. Small amount of air under the right hemidiaphragm. This could be from recent surgery. Critical Value/emergent results were called by telephone at the time of interpretation on 09/21/2020 at 8:22 am to nurse Alissa, who verbally acknowledged these results. Electronically Signed   By: Marcello Moores  Register   On: 09/21/2020 08:26    Assessment/Plan: S/P Procedure(s) (LRB): VIDEO BRONCHOSCOPY (N/A) Transhiatal total ESOPHAGECTOMY with Pyloroplasty (N/A) JEJUNOSTOMY (N/A) REMOVAL OF GASTROSTOMY TUBE  POD#3 1 Tmax 100.3, VSS, occas HTN 2 sats good on 3 liters 3 H/H slightly down 4 renal fxn is normal 5 BS well controlled 6 CXR pretty stable in appearance, Bibasilar atx- push pulm toile/rehab as able 6 may be able to advance TF's  LOS: 3 days    John Giovanni PA-C Pager [phone] 09/23/2020  Wbc decreasing Increasing tube feeding today Work on [employer] pending I have seen and examined [employer] and agree with the above assessment  and plan.  Grace Isaac MD Beeper [phone] Office [phone] 09/23/2020 9:09 AM

## 2020-09-23 NOTE — Progress Notes (Signed)
Unable to waste Hydromorphone PCA syringe in Pyxis. Wasted 6 mg/30ml with Carmel [place] RN into stericycle.

## 2020-09-23 NOTE — Progress Notes (Signed)
Patient ID: Thomas Gonzalez, male   DOB: 1966-10-03, 54 y.o.   MRN: [id number] [facility] Evening Rounds:  Hemodynamically stable  sats 94% on [place] Tmax 99.2 today.

## 2020-09-24 LAB — GLUCOSE, CAPILLARY
Glucose-Capillary: 127 mg/dL — ABNORMAL HIGH (ref 70–99)
Glucose-Capillary: 124 mg/dL — ABNORMAL HIGH (ref 70–99)
Glucose-Capillary: 118 mg/dL — ABNORMAL HIGH (ref 70–99)
Glucose-Capillary: 105 mg/dL — ABNORMAL HIGH (ref 70–99)
Glucose-Capillary: 132 mg/dL — ABNORMAL HIGH (ref 70–99)
Glucose-Capillary: 124 mg/dL — ABNORMAL HIGH (ref 70–99)

## 2020-09-24 LAB — BASIC METABOLIC PANEL
Anion gap: 8 (ref 5–15)
BUN: 13 mg/dL (ref 6–20)
CO2: 27 mmol/L (ref 22–32)
Calcium: 8.4 mg/dL — ABNORMAL LOW (ref 8.9–10.3)
Chloride: 101 mmol/L (ref 98–111)
Creatinine, Ser: 0.64 mg/dL (ref 0.61–1.24)
GFR, Estimated: >60 mL/min (ref >60)
Glucose, Bld: 145 mg/dL — ABNORMAL HIGH (ref 70–99)
Potassium: 3.6 mmol/L (ref 3.5–5.1)
Sodium: 136 mmol/L (ref 135–145)

## 2020-09-24 LAB — CBC
HCT: 38.8 % — ABNORMAL LOW (ref 39.0–52.0)
Hemoglobin: 13.2 g/dL (ref 13.0–17.0)
MCH: 32.9 pg (ref 26.0–34.0)
MCHC: 34 g/dL (ref 30.0–36.0)
MCV: 96.8 fL (ref 80.0–100.0)
Platelets: 208 10*3/uL (ref 150–400)
RBC: 4.01 MIL/uL — ABNORMAL LOW (ref 4.22–5.81)
RDW: 15 % (ref 11.5–15.5)
WBC: 8.2 10*3/uL (ref 4.0–10.5)
nRBC: 0 % (ref 0.0–0.2)

## 2020-09-24 MED ORDER — OSMOLITE 1.5 CAL PO LIQD
1000.0000 mL | ORAL | Status: DC
  Administered 2020-09-24 – 2020-10-04 (×16): 1000 mL
  Filled 2020-09-24 (×9): qty 1000

## 2020-09-24 NOTE — Discharge Instructions (Signed)
[facility] office [phone]  Esophagectomy, Care After This sheet gives you information about how to care for yourself after your procedure. Your health care provider may also give you more specific instructions. If you have problems or questions, contact your health care provider. What can I expect after the procedure? After the procedure, it is common to have:  Soreness near the incision sites.  Soreness when swallowing food.  Tiredness (fatigue).  Loss of appetite.  Nausea.  Heartburn.  Diarrhea. Follow these instructions at home: Feeding tube  If you were sent home with a feeding tube, follow instructions from your health care provider about taking care of the tube.  Work with your nutrition care provider for using your feeding tube and getting enough nutrition. Eating and drinking  Follow instructions from your health care provider about eating or drinking restrictions. You may need to eat smaller and more frequent meals.  Drink enough fluid to keep your urine clear or pale yellow. Activity  Do not lift anything that is heavier than 10 lb (4.5 kg) until your surgeon says you can.  Do not drive or operate heavy machinery while taking prescription pain medicine.  Return to your normal activities as told by your health care provider. Ask your health care provider what activities are safe for you. Incision care   Follow instructions from your health care provider about how to take care of your incision. Make sure you: ? Change your bandage (dressing) as told by your health care provider. ? Wash your hands with soap and water before you change your dressing. If soap and water are not available, use hand sanitizer. ? Leave stitches (sutures), skin glue, or adhesive strips in place. These skin closures may need to stay in place for 2 weeks or longer. If adhesive strip edges start to loosen and curl up, you may trim the loose edges. Do not remove adhesive strips completely  unless your health care provider tells you to do that.  Do not take baths, swim, or use a hot tub until your health care provider approves  Check your incision areas every day for signs of infection. Check for: ? More redness, swelling, or pain. ? More fluid or blood. ? Warmth. ? Pus or a bad smell. General instructions  To prevent or treat constipation while you are taking prescription pain medicine, your health care provider may recommend that you: ? Take over-the-counter or prescription medicines. ? Eat foods that are high in fiber, such as fresh fruits and vegetables, whole grains, and beans. ? Limit foods that are high in fat and processed sugars, such as fried and sweet foods.  Take over-the-counter and prescription medicines only as told by your health care provider.  Wear compression stockings as told by your health care provider. These stockings help prevent blood clots and reduce swelling in your legs.  Do not use any products that contain nicotine or tobacco, such as cigarettes and e-cigarettes. If you need help quitting, ask your health care provider.  Keep all follow-up visits as told by your health care provider. This is important. Contact a health care provider if:  You have problems or questions about your feeding tube.  You have chills or fever.  You have pain that is not controlled by your pain medicine.  You have trouble swallowing.  You have persistent or worsening heartburn.  You have persistent or worsening diarrhea.  You have more redness, swelling, or pain around your incision area.  You have more fluid  coming from your incision area.  Your incision feels warm to the touch.  You have pus or a bad smell coming from your incision area.  You have a persistent or worsening cough. Get help right away if:  You have severe pain.  You are bleeding from an incision area.  You are vomiting blood.  You are unable to swallow.  You have trouble  breathing.  You have chest pain. Summary  Expect some discomfort and fatigue during your recovery.  Work with your nutrition care provider if you still have a feeding tube at home.  Follow your surgeon's directions for care of your incision areas. Clean gently with soap and water, do not rub, dry carefully.  Return to normal activities gradually, as told by your health care provider. This information is not intended to replace advice given to you by your health care provider. Make sure you discuss any questions you have with your health care provider. Document Revised: 10/19/2017 Document Reviewed: 07/06/2016 Elsevier Patient Education  2020 [employer].

## 2020-09-24 NOTE — Progress Notes (Signed)
Patient ID: Thomas Gonzalez, male   DOB: Feb 21, 1966, 54 y.o.   MRN: [id number] EVENING ROUNDS NOTE :     [address]       [place] [address]             [phone]                 4 Days Post-Op Procedure(s) (LRB): VIDEO BRONCHOSCOPY (N/A) Transhiatal total ESOPHAGECTOMY with Pyloroplasty (N/A) JEJUNOSTOMY (N/A) REMOVAL OF GASTROSTOMY TUBE  Total Length of Stay:  LOS: 4 days  BP (!) 144/93   Pulse 95   Temp (!) 100.6 F (38.1 C) (Oral)   Resp 15   Ht 5\' 9"  (1.753 m)   Wt 89 kg   SpO2 93%   BMI 28.98 kg/m   .Intake/Output      11/04 0701 - 11/05 0700 11/05 0701 - 11/06 0700   I.V. (mL/kg) 1447.2 (16.3) 191.7 (2.2)   Other 150 30   NG/GT 890    Total Intake(mL/kg) 2487.2 (27.9) 221.7 (2.5)   Urine (mL/kg/hr) 100 (0) 250 (0.3)   Emesis/NG output 350    Drains     Total Output 450 250   Net +2037.2 -28.3        Urine Occurrence 3 x      . dextrose 5 % and 0.9% NaCl 10 mL/hr at 09/24/20 1300  . feeding supplement (OSMOLITE 1.5 CAL) 1,000 mL (09/24/20 0918)     Lab Results  Component Value Date   WBC 8.2 09/24/2020   HGB 13.2 09/24/2020   HCT 38.8 (L) 09/24/2020   PLT 208 09/24/2020   GLUCOSE 145 (H) 09/24/2020   ALT 73 (H) 09/16/2020   AST 42 (H) 09/16/2020   NA 136 09/24/2020   K 3.6 09/24/2020   CL 101 09/24/2020   CREATININE 0.64 09/24/2020   BUN 13 09/24/2020   CO2 27 09/24/2020   INR 1.0 09/16/2020    Stable day Tf at goal Some bowel sounds very little out of ng today , ng out [employer] on monday  Thales Knipple B Iriana Artley MD  Beeper [phone] Office [phone] 09/24/2020 5:44 PM

## 2020-09-24 NOTE — Discharge Summary (Signed)
Physician Discharge Summary  Patient ID: Thomas Gonzalez MRN: [id number] DOB/AGE: Nov 06, 1966 54 y.o.  Admit date: 09/20/2020 Discharge date: 10/06/2020  Admission Diagnoses: Esophageal cancer  Discharge Diagnoses:  Active Problems:   H/O esophagectomy  Patient Active Problem List   Diagnosis Date Noted  . H/O esophagectomy 09/20/2020  . Sciatica 07/14/2020  . Esophageal carcinoma ([place]) 07/06/2020  . Melena 02/06/2020   History of Present Illness:    Thomas Gonzalez 54 y.o. 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  The patient notes that in March 2021 he had a "stomachache" this was made worse by eating but at the time he did not have obstructive symptoms.  He sought medical attention at that time when he noted dark stools he was noted to be found anemic was given IV iron and ultimately May 18, 2020 he had upper GI endoscopy and a colonoscopy.  Upper GI endoscopy dated May 18, 2020 by Dr. Lyda Jester shows esophageal distal mass friable tissue with ulceration at approximately  38 cm extending to the gastric cardia at approximately 40 cm this was described as circumferential the mass was non obstructing. There was additional mass approximately 1-1/2 cm in length between 30 and 31 cm both of these areas were biopsied and confirmed moderate to poorly differentiated adenocarcinoma [facility] health pathology report S CF [id number] 71.   A midline PEG tube was placed-the patient notes that he was never obstructed and is never used the PEG tube   CT scan of the chest and abdomen was done as was the PET scan the patient has not had staging esophageal ultrasound/EUS.  Based on his CT and PET scan he was staged clinically  STAGE IVA .  He has been treated with 3 cycles of FOLFOX with concurrent radiation therapy done at [facility].  His last dose of chemotherapy was on August 24.  Last dose of radiation September 7.  The patient  notes that he has tolerated his treatments relatively well he has been careful about chewing his food and has made efforts to maintain his weight, as result he has not had significant weight loss over this..  Follow-up CT of the chest and abdomen was done and the patient was referred to consider surgical treatment  Previous abdominal surgery includes appendectomy in 2018, he has had inguinal hernias repaired surgically x3  Patient is a non-smoker, he has had no previous cardiac history  The patient and his studies were evaluated by Dr. Servando Snare who recommended proceeding with surgical resection and he was admitted this hospitalization for the procedure.  Hospital Course: Patient was admitted electively and on 09/20/2020 taken the operating room where he underwent the below described procedure.  He tolerated it well and was taken to the surgical intensive care unit in stable condition.   Postoperative hospital course:   The patient has overall done quite well.  He was extubated from the ventilator without difficulty evening of surgery.  He is remained hemodynamic stable.  He has had a steady bowel function and J-tube feedings have been initiated in the standard fashion.  He does have a very mild expected acute blood loss anemia which is stable.  Is tolerating gradually increasing activities using standard protocols.  A swallow study was performed on 09/27/2020 which did not show any evidence of leak.  He has been started on a course of advancement in diet.  On 09/30/2019 when he did develop fevers and he  has undergone a course of fever work-up.  There is a slight amount of drainage from the neck incision as well as from the abdominal incision and has been started on a course of Levaquin.  This incision was opened on 10/05/2020 begin a course of packing with saline gauze.  He has been transitioned to a dysphagia 3 diet with nighttime G-tube feedings see assistance of dietitian.  However arrangements are  being made for wound care as well as assistance with these feedings.  Oxygen has been weaned and he maintains good saturations on room air.  Neck incision at this time is stable and the drain has been discontinued.He is felt to be stable for discharge today with home care assistance.   Discharged Condition: good  Consults: None  Significant Diagnostic Studies: swallowing study, routine labs and post op CXR's No results found. Treatments: surgery:  OPERATIVE REPORT  DATE OF PROCEDURE:  09/20/2020  PREOPERATIVE DIAGNOSIS:  Adenocarcinoma of the distal esophagus, gastroesophageal junction.  POSTOPERATIVE DIAGNOSIS:  Adenocarcinoma of the distal esophagus, gastroesophageal junction.  SURGICAL PROCEDURE:  Video bronchoscopy, transhiatal total esophagectomy with cervical esophagogastrotomy, pyloroplasty, placement of feeding jejunostomy tube, removal of PEG tube.  SURGEON:  Lanelle Bal, MD  FIRST ASSISTANT:  Dr. Kipp Brood  SECOND ASSISTANT:  Jadene Pierini, PA     OPERATIVE REPORT  DATE OF PROCEDURE:  09/20/2020  PREOPERATIVE DIAGNOSIS:  Adenocarcinoma of the distal esophagus, gastroesophageal junction.  POSTOPERATIVE DIAGNOSIS:  Adenocarcinoma of the distal esophagus, gastroesophageal junction.  SURGICAL PROCEDURE:  Video bronchoscopy, transhiatal total esophagectomy with cervical esophagogastrotomy, pyloroplasty, placement of feeding jejunostomy tube, removal of PEG tube.  SURGEON:  Lanelle Bal, MD  FIRST ASSISTANT:  Dr. Kipp Brood  SECOND ASSISTANT:  Jadene Pierini, [place]  Discharge Exam: Blood pressure 123/83, pulse 90, temperature 98.6 F (37 C), temperature source Oral, resp. rate 20, height 5\' 9"  (1.753 m), weight 82.6 kg, SpO2 94 %.  General appearance: alert, cooperative and no distress Heart: regular rate and rhythm Lungs: clear to auscultation bilaterally Abdomen: benign Extremities: no edema or calf tenderness Wound: wound with mod purulent  drainage- dressing changed/repacked wound, slight purulence fron J - tube site as well Disposition: Discharge disposition: 01-Home or Self Care      Discharge Instructions    Discharge patient   Complete by: As directed    With home care in place   Discharge disposition: 01-Home or Self Care   Discharge patient date: 10/06/2020     Allergies as of 10/06/2020      Reactions   Penicillins Anaphylaxis   Prednisone Anxiety   Mood swings      Medication List    STOP taking these medications   calcium carbonate 500 MG chewable tablet Commonly known as: TUMS - dosed in mg elemental calcium   ferrous sulfate 325 (65 FE) MG tablet   meloxicam 15 MG tablet Commonly known as: MOBIC   ondansetron 4 MG tablet Commonly known as: ZOFRAN   prochlorperazine 10 MG tablet Commonly known as: COMPAZINE     TAKE these medications   acetaminophen 325 MG tablet Commonly known as: Tylenol Take 2 tablets (650 mg total) by mouth every 6 (six) hours as needed.   ascorbic acid 500 MG tablet Commonly known as: VITAMIN C Take 500 mg by mouth daily.   cholecalciferol 25 MCG (1000 UNIT) tablet Commonly known as: VITAMIN D3 Take 1,000 Units by mouth daily.   feeding supplement Liqd Take 237 mLs by mouth 2 (two) times daily between meals.  feeding supplement (OSMOLITE 1.5 CAL) Liqd Place 1,520 mLs into feeding tube daily.   feeding supplement (PROSource TF) liquid Place 45 mLs into feeding tube 3 (three) times daily.   levofloxacin 750 MG tablet Commonly known as: Levaquin Take 1 tablet (750 mg total) by mouth daily.   lidocaine 5 % Commonly known as: LIDODERM USE 1 PATCH EXTERNALLY ONCE DAILY AS NEEDED FOR PAIN. APPLY TOPICALLY TO INTACT SKIN OVER AREA OF GREATEST PAIN. YOU MAY CUT TO SIZE MAXIMUM   metoprolol tartrate 25 MG tablet Commonly known as: LOPRESSOR Take 0.5 tablets (12.5 mg total) by mouth 2 (two) times daily.   omeprazole 40 MG capsule Commonly known as:  PRILOSEC Take 40 mg by mouth every morning. What changed: Another medication with the same name was removed. Continue taking this medication, and follow the directions you see here.   oxyCODONE 5 MG immediate release tablet Commonly known as: Oxy IR/ROXICODONE Take 1 tablet (5 mg total) by mouth every 6 (six) hours as needed for up to 7 days for severe pain. What changed:   when to take this  reasons to take this   zinc gluconate 50 MG tablet Take 50 mg by mouth daily.            Durable Medical Equipment  (From admission, onward)         Start     Ordered   09/29/20 1427  For home use only DME Tube feeding  Once       Comments: Anticipate pt may be ready for discharge at the end of the week.  RD recommends:  Osmolite 1.5 at 71ml/hr for 16 hrs per day  ProSource 54ml TID.   09/28/20 1430   09/29/20 1425  For home use only DME Tube feeding pump  Once       Comments: Anticipate discharge at the end of the week.  Question:  Length of Need  Answer:  6 Months   09/28/20 1430          Follow-up Information    Grace Isaac, MD Follow up.   Specialty: Cardiothoracic Surgery Why: Please see discharge paperwork for follow-up appointment with your surgeon. Contact information: [address] [place] [address] [phone]               Signed: John Giovanni PA-C 10/06/2020, 1:09 PM

## 2020-09-24 NOTE — Progress Notes (Signed)
Patient ID: Thomas Gonzalez, male   DOB: [birth year], 54 y.o.   MRN: [id number] TCTS DAILY ICU PROGRESS NOTE                   [address]            [place] [address]          [phone]   4 Days Post-Op Procedure(s) (LRB): VIDEO BRONCHOSCOPY (N/A) Transhiatal total ESOPHAGECTOMY with Pyloroplasty (N/A) JEJUNOSTOMY (N/A) REMOVAL OF GASTROSTOMY TUBE  Total Length of Stay:  LOS: 4 days   Subjective: Patient awake alert neurologically intact, ambulated in the hall yesterday, working more on incentive spirometry  Objective: Vital signs in last 24 hours: Temp:  [98.3 F (36.8 C)-100.1 F (37.8 C)] 98.3 F (36.8 C) (11/05 0346) Pulse Rate:  [85-108] 90 (11/05 0700) Cardiac Rhythm: Normal sinus rhythm (11/05 0400) Resp:  [10-23] 12 (11/05 0700) BP: (127-156)/(83-122) 135/93 (11/05 0700) SpO2:  [90 %-98 %] 92 % (11/05 0700) Weight:  [89 kg] 89 kg (11/05 0500)  Filed Weights   09/20/20 0617 09/21/20 0500 09/24/20 0500  Weight: 88.5 kg 87.6 kg 89 kg    Weight change:    Hemodynamic parameters for last 24 hours:    Intake/Output from previous day: 11/04 0701 - 11/05 0700 In: 2487.2 [I.V.:1447.2; NG/GT:890] Out: 450 [Urine:100; Emesis/NG output:350]  Intake/Output this shift: No intake/output data recorded.  Current Meds: Scheduled Meds: . chlorhexidine  15 mL Mouth Rinse BID  . Chlorhexidine Gluconate Cloth  6 each Topical Daily  . enoxaparin (LOVENOX) injection  40 mg Subcutaneous Daily  . feeding supplement (PROSource TF)  45 mL Per Tube TID  . HYDROmorphone   Intravenous Q4H  . insulin aspart  0-24 Units Subcutaneous Q4H  . lidocaine  1 patch Transdermal Q24H  . mouth rinse  15 mL Mouth Rinse q12n4p  . pantoprazole (PROTONIX) IV  40 mg Intravenous Q12H   Continuous Infusions: . dextrose 5 % and 0.9% NaCl 50 mL/hr at 09/24/20 0600  . feeding supplement (OSMOLITE 1.5 CAL) 60 mL/hr at 09/24/20 0300   PRN Meds:.diphenhydrAMINE **OR**  diphenhydrAMINE, metoprolol tartrate, naloxone **AND** sodium chloride flush, ondansetron (ZOFRAN) IV, phenol  General appearance: alert and cooperative Neurologic: intact Heart: regular rate and rhythm, S1, S2 normal, no murmur, click, rub or gallop Lungs: diminished breath sounds bibasilar Abdomen: soft, non-tender; bowel sounds normal; no masses,  no organomegaly Extremities: extremities normal, atraumatic, no cyanosis or edema Wound: Aquacel dressings intact minimal drainage from neck incision/Penrose  Lab Results: CBC: Recent Labs    09/23/20 0034 09/24/20 0054  WBC 10.6* 8.2  HGB 13.0 13.2  HCT 38.4* 38.8*  PLT 163 208   BMET:  Recent Labs    09/23/20 0034 09/24/20 0054  NA 135 136  K 3.8 3.6  CL 103 101  CO2 25 27  GLUCOSE 129* 145*  BUN 11 13  CREATININE 0.60* 0.64  CALCIUM 8.2* 8.4*    CMET: Lab Results  Component Value Date   WBC 8.2 09/24/2020   HGB 13.2 09/24/2020   HCT 38.8 (L) 09/24/2020   PLT 208 09/24/2020   GLUCOSE 145 (H) 09/24/2020   ALT 73 (H) 09/16/2020   AST 42 (H) 09/16/2020   NA 136 09/24/2020   K 3.6 09/24/2020   CL 101 09/24/2020   CREATININE 0.64 09/24/2020   BUN 13 09/24/2020   CO2 27 09/24/2020   INR 1.0 09/16/2020      PT/INR: No results for  input(s): LABPROT, INR in the last 72 hours. Radiology: No results found.  Cancer Staging Esophageal carcinoma [facility]) Staging form: Esophagus - Adenocarcinoma, AJCC 8th Edition - Clinical stage from 09/02/2020: Stage Unknown (cTX, cN2, cM0, G3) - Signed by Grace Isaac, MD on 09/02/2020 - Pathologic stage from 09/23/2020: Stage I (ypT2, pN0, cM0, G2) - Signed by Grace Isaac, MD on 09/23/2020 Final path reviewed with patient 14 - lymph nodes, adenocarcinoma confirmed  Assessment/Plan: S/P Procedure(s) (LRB): VIDEO BRONCHOSCOPY (N/A) Transhiatal total ESOPHAGECTOMY with Pyloroplasty (N/A) JEJUNOSTOMY (N/A) REMOVAL OF GASTROSTOMY TUBE Mobilize Tolerating tube feedings at  60 mL an hour-still hypoactive bowel sounds with no bowel movement yet 2 50-3 50 from NG tube over 24 hours-we will monitor today and possible DC later today or tomorrow    Grace Isaac 09/24/2020 7:29 AM

## 2020-09-25 ENCOUNTER — Inpatient Hospital Stay (HOSPITAL_COMMUNITY): Payer: BC Managed Care – PPO

## 2020-09-25 LAB — GLUCOSE, CAPILLARY
Glucose-Capillary: 109 mg/dL — ABNORMAL HIGH (ref 70–99)
Glucose-Capillary: 117 mg/dL — ABNORMAL HIGH (ref 70–99)
Glucose-Capillary: 126 mg/dL — ABNORMAL HIGH (ref 70–99)
Glucose-Capillary: 130 mg/dL — ABNORMAL HIGH (ref 70–99)
Glucose-Capillary: 135 mg/dL — ABNORMAL HIGH (ref 70–99)
Glucose-Capillary: 146 mg/dL — ABNORMAL HIGH (ref 70–99)

## 2020-09-25 LAB — CBC
HCT: 40.5 % (ref 39.0–52.0)
Hemoglobin: 13.6 g/dL (ref 13.0–17.0)
MCH: 32.5 pg (ref 26.0–34.0)
MCHC: 33.6 g/dL (ref 30.0–36.0)
MCV: 96.9 fL (ref 80.0–100.0)
Platelets: 213 10*3/uL (ref 150–400)
RBC: 4.18 MIL/uL — ABNORMAL LOW (ref 4.22–5.81)
RDW: 14.7 % (ref 11.5–15.5)
WBC: 9.6 10*3/uL (ref 4.0–10.5)
nRBC: 0 % (ref 0.0–0.2)

## 2020-09-25 LAB — BASIC METABOLIC PANEL
Anion gap: 8 (ref 5–15)
BUN: 15 mg/dL (ref 6–20)
CO2: 29 mmol/L (ref 22–32)
Calcium: 8.6 mg/dL — ABNORMAL LOW (ref 8.9–10.3)
Chloride: 100 mmol/L (ref 98–111)
Creatinine, Ser: 0.74 mg/dL (ref 0.61–1.24)
GFR, Estimated: >60 mL/min (ref >60)
Glucose, Bld: 119 mg/dL — ABNORMAL HIGH (ref 70–99)
Potassium: 4 mmol/L (ref 3.5–5.1)
Sodium: 137 mmol/L (ref 135–145)

## 2020-09-25 NOTE — Progress Notes (Signed)
Patient ID: Thomas Gonzalez, male   DOB: 13-Jul-1966, 54 y.o.   MRN: [id number] TCTS DAILY ICU PROGRESS NOTE                   [address]            [place] [address]          [phone]   5 Days Post-Op Procedure(s) (LRB): VIDEO BRONCHOSCOPY (N/A) Transhiatal total ESOPHAGECTOMY with Pyloroplasty (N/A) JEJUNOSTOMY (N/A) REMOVAL OF GASTROSTOMY TUBE  Total Length of Stay:  LOS: 5 days   Subjective: Feels better with ng tube out, up to chair   Objective: Vital signs in last 24 hours: Temp:  [97.8 F (36.6 C)-100.6 F (38.1 C)] 97.8 F (36.6 C) (11/06 0726) Pulse Rate:  [94-107] 97 (11/06 0900) Cardiac Rhythm: Normal sinus rhythm (11/06 0000) Resp:  [12-19] 12 (11/06 0900) BP: (127-163)/(86-109) 147/98 (11/06 0900) SpO2:  [90 %-95 %] 94 % (11/06 0900) Weight:  [89 kg] 89 kg (11/06 0600)  Filed Weights   09/21/20 0500 09/24/20 0500 09/25/20 0600  Weight: 87.6 kg 89 kg 89 kg    Weight change: 0 kg   Hemodynamic parameters for last 24 hours:    Intake/Output from previous day: 11/05 0701 - 11/06 0700 In: 1109.2 [I.V.:369.2; NG/GT:650] Out: 1000 [Urine:850; Emesis/NG output:150]  Intake/Output this shift: Total I/O In: 160 [I.V.:30; NG/GT:130] Out: 300 [Urine:300]  Current Meds: Scheduled Meds: . chlorhexidine  15 mL Mouth Rinse BID  . Chlorhexidine Gluconate Cloth  6 each Topical Daily  . enoxaparin (LOVENOX) injection  40 mg Subcutaneous Daily  . feeding supplement (PROSource TF)  45 mL Per Tube TID  . HYDROmorphone   Intravenous Q4H  . insulin aspart  0-24 Units Subcutaneous Q4H  . lidocaine  1 patch Transdermal Q24H  . mouth rinse  15 mL Mouth Rinse q12n4p  . pantoprazole (PROTONIX) IV  40 mg Intravenous Q12H   Continuous Infusions: . dextrose 5 % and 0.9% NaCl 10 mL/hr at 09/25/20 0900  . feeding supplement (OSMOLITE 1.5 CAL) 65 mL/hr at 09/25/20 0200   PRN Meds:.diphenhydrAMINE **OR** diphenhydrAMINE, metoprolol tartrate,  naloxone **AND** sodium chloride flush, ondansetron (ZOFRAN) IV, phenol  General appearance: alert and cooperative Neurologic: intact Heart: regular rate and rhythm, S1, S2 normal, no murmur, click, rub or gallop Lungs: diminished breath sounds bibasilar Abdomen: bowel sounds present , no bm yet Extremities: extremities normal, atraumatic, no cyanosis or edema and Homans sign is negative, no sign of DVT Wound: intact incisions, minimial neck penrose drainage   Lab Results: CBC: Recent Labs    09/24/20 0054 09/25/20 0206  WBC 8.2 9.6  HGB 13.2 13.6  HCT 38.8* 40.5  PLT 208 213   BMET:  Recent Labs    09/24/20 0054 09/25/20 0206  NA 136 137  K 3.6 4.0  CL 101 100  CO2 27 29  GLUCOSE 145* 119*  BUN 13 15  CREATININE 0.64 0.74  CALCIUM 8.4* 8.6*    CMET: Lab Results  Component Value Date   WBC 9.6 09/25/2020   HGB 13.6 09/25/2020   HCT 40.5 09/25/2020   PLT 213 09/25/2020   GLUCOSE 119 (H) 09/25/2020   ALT 73 (H) 09/16/2020   AST 42 (H) 09/16/2020   NA 137 09/25/2020   K 4.0 09/25/2020   CL 100 09/25/2020   CREATININE 0.74 09/25/2020   BUN 15 09/25/2020   CO2 29 09/25/2020   INR 1.0 09/16/2020  PT/INR: No results for input(s): LABPROT, INR in the last 72 hours. Radiology: [facility] Chest Port 1 View  Result Date: 09/25/2020 CLINICAL DATA:  Chest tube EXAM: PORTABLE CHEST 1 VIEW COMPARISON:  09/23/2020 chest radiograph. FINDINGS: Right subclavian Port-A-Cath terminates over the lower third of the SVC. Stable cardiomediastinal silhouette with normal heart size and expected postsurgical changes from esophagectomy with gastric pull-through. Small left apical pneumothorax, less than 5%, slightly decreased compared to the prior radiograph in retrospect. No right pneumothorax. Small basilar left pleural effusion is stable. No right pleural effusion. Patchy bibasilar lung opacities are stable. No pulmonary edema. IMPRESSION: 1. Small left apical pneumothorax, less than 5%,  slightly decreased compared to prior radiograph in retrospect. 2. Stable small basilar left pleural effusion. 3. Stable patchy bibasilar lung opacities, favor atelectasis. Electronically Signed   By: Ilona Sorrel M.D.   On: 09/25/2020 08:28     Assessment/Plan: S/P Procedure(s) (LRB): VIDEO BRONCHOSCOPY (N/A) Transhiatal total ESOPHAGECTOMY with Pyloroplasty (N/A) JEJUNOSTOMY (N/A) REMOVAL OF GASTROSTOMY TUBE Mobilize Continue tube feedings at goal gastrografin shallow Monday    Grace Isaac 09/25/2020 9:47 AM

## 2020-09-25 NOTE — Progress Notes (Signed)
Patient ID: Ural Acree, male   DOB: 09-Jan-1966, 54 y.o.   MRN: [id number] EVENING ROUNDS NOTE :     [address]       [place] [address]             [phone]                 5 Days Post-Op Procedure(s) (LRB): VIDEO BRONCHOSCOPY (N/A) Transhiatal total ESOPHAGECTOMY with Pyloroplasty (N/A) JEJUNOSTOMY (N/A) REMOVAL OF GASTROSTOMY TUBE  Total Length of Stay:  LOS: 5 days  BP (!) 129/99   Pulse (!) 102   Temp 99.3 F (37.4 C)   Resp 20   Ht 5\' 9"  (1.753 m)   Wt 89 kg   SpO2 95%   BMI 28.98 kg/m   .Intake/Output      11/06 0701 - 11/07 0700   I.V. (mL/kg) 94.4 (1.1)   Other    NG/GT 195   Total Intake(mL/kg) 289.4 (3.3)   Urine (mL/kg/hr) 600 (0.6)   Emesis/NG output    Total Output 600   Net -310.6       Urine Occurrence 1 x     . dextrose 5 % and 0.9% NaCl 10 mL/hr at 09/25/20 1600  . feeding supplement (OSMOLITE 1.5 CAL) 1,000 mL (09/25/20 1620)     Lab Results  Component Value Date   WBC 9.6 09/25/2020   HGB 13.6 09/25/2020   HCT 40.5 09/25/2020   PLT 213 09/25/2020   GLUCOSE 119 (H) 09/25/2020   ALT 73 (H) 09/16/2020   AST 42 (H) 09/16/2020   NA 137 09/25/2020   K 4.0 09/25/2020   CL 100 09/25/2020   CREATININE 0.74 09/25/2020   BUN 15 09/25/2020   CO2 29 09/25/2020   INR 1.0 09/16/2020   Stable day Swallow on Monday No drainage from neck penrose   Grace Isaac MD  Beeper [phone] Office [phone] 09/25/2020 7:05 PM

## 2020-09-25 NOTE — Progress Notes (Signed)
PCA pump cleared, Faith, RN as witness.  7.4mg  given during 12 hour shift 45 demands/37 doses delivered

## 2020-09-26 LAB — GLUCOSE, CAPILLARY
Glucose-Capillary: 120 mg/dL — ABNORMAL HIGH (ref 70–99)
Glucose-Capillary: 121 mg/dL — ABNORMAL HIGH (ref 70–99)
Glucose-Capillary: 130 mg/dL — ABNORMAL HIGH (ref 70–99)
Glucose-Capillary: 136 mg/dL — ABNORMAL HIGH (ref 70–99)
Glucose-Capillary: 136 mg/dL — ABNORMAL HIGH (ref 70–99)
Glucose-Capillary: 150 mg/dL — ABNORMAL HIGH (ref 70–99)

## 2020-09-26 NOTE — Progress Notes (Signed)
°   09/26/20 1942  Assess: MEWS Score  Temp 98.6 F (37 C)  BP (!) 145/94  Pulse Rate (!) 102  ECG Heart Rate (!) 102  Resp 13  Level of Consciousness Alert  SpO2 93 %  O2 Device Nasal Cannula  Patient Activity (if Appropriate) In bed  O2 Flow Rate (L/min) 4 L/min  Assess: MEWS Score  MEWS Temp 0  MEWS Systolic 0  MEWS Pulse 1  MEWS RR 1  MEWS LOC 0  MEWS Score 2  MEWS Score Color Yellow  Assess: if the MEWS score is Yellow or Red  Were vital signs taken at a resting state? Yes  Focused Assessment No change from prior assessment  Early Detection of Sepsis Score *See Row Information* Low  MEWS guidelines implemented *See Row Information* Yes  Treat  MEWS Interventions Escalated (See documentation below)  Pain Scale 0-10  Pain Score 2  Pain Type Surgical pain  Pain Location Back  Pain Orientation Upper  Pain Descriptors / Indicators Aching  Pain Onset On-going  Patients Stated Pain Goal 1  Pain Intervention(s) PCA encouraged  Take Vital Signs  Increase Vital Sign Frequency  Yellow: Q 2hr X 2 then Q 4hr X 2, if remains yellow, continue Q 4hrs  Escalate  MEWS: Escalate Yellow: discuss with charge nurse/RN and consider discussing with provider and RRT  Notify: Charge Nurse/RN  Name of Charge Nurse/RN Notified Karlene, RN  Date Charge Nurse/RN Notified 09/26/20  Time Charge Nurse/RN Notified 1942  Document  Patient Outcome  (on unit)  Progress note created (see row info) Yes

## 2020-09-26 NOTE — Progress Notes (Signed)
Patient ID: Thomas Gonzalez, male   DOB: [birth year], 54 y.o.   MRN: [id number] TCTS DAILY ICU PROGRESS NOTE                   [address]            [place] [address]          [phone]   6 Days Post-Op Procedure(s) (LRB): VIDEO BRONCHOSCOPY (N/A) Transhiatal total ESOPHAGECTOMY with Pyloroplasty (N/A) JEJUNOSTOMY (N/A) REMOVAL OF GASTROSTOMY TUBE  Total Length of Stay:  LOS: 6 days   Subjective: Patient awake alert, notes that he feels much better today, walked around the unit this morning  Objective: Vital signs in last 24 hours: Temp:  [98.2 F (36.8 C)-100 F (37.8 C)] 98.2 F (36.8 C) (11/07 0734) Pulse Rate:  [86-110] 109 (11/07 1100) Cardiac Rhythm: Normal sinus rhythm (11/07 0742) Resp:  [12-33] 22 (11/07 1100) BP: (122-169)/(86-144) 134/94 (11/07 1100) SpO2:  [91 %-98 %] 92 % (11/07 1100) Weight:  [91 kg] 91 kg (11/07 0444)  Filed Weights   09/24/20 0500 09/25/20 0600 09/26/20 0444  Weight: 89 kg 89 kg 91 kg    Weight change: 2 kg   Hemodynamic parameters for last 24 hours:    Intake/Output from previous day: 11/06 0701 - 11/07 0700 In: 718.8 [I.V.:133.8; NG/GT:585] Out: 1100 [Urine:1100]  Intake/Output this shift: No intake/output data recorded.  Current Meds: Scheduled Meds: . chlorhexidine  15 mL Mouth Rinse BID  . Chlorhexidine Gluconate Cloth  6 each Topical Daily  . enoxaparin (LOVENOX) injection  40 mg Subcutaneous Daily  . feeding supplement (PROSource TF)  45 mL Per Tube TID  . HYDROmorphone   Intravenous Q4H  . insulin aspart  0-24 Units Subcutaneous Q4H  . lidocaine  1 patch Transdermal Q24H  . mouth rinse  15 mL Mouth Rinse q12n4p  . pantoprazole (PROTONIX) IV  40 mg Intravenous Q12H   Continuous Infusions: . dextrose 5 % and 0.9% NaCl 10 mL/hr at 09/25/20 2000  . feeding supplement (OSMOLITE 1.5 CAL) 1,000 mL (09/25/20 1620)   PRN Meds:.diphenhydrAMINE **OR** diphenhydrAMINE, metoprolol tartrate, naloxone  **AND** sodium chloride flush, ondansetron (ZOFRAN) IV, phenol  General appearance: alert, cooperative and no distress Neurologic: intact Heart: regular rate and rhythm, S1, S2 normal, no murmur, click, rub or gallop Lungs: diminished breath sounds bibasilar Abdomen: soft, non-tender; bowel sounds normal; no masses,  no organomegaly Extremities: extremities normal, atraumatic, no cyanosis or edema Wound: Incisions are intact without evidence of infection no drainage from the Penrose left neck  Lab Results: CBC: Recent Labs    09/24/20 0054 09/25/20 0206  WBC 8.2 9.6  HGB 13.2 13.6  HCT 38.8* 40.5  PLT 208 213   BMET:  Recent Labs    09/24/20 0054 09/25/20 0206  NA 136 137  K 3.6 4.0  CL 101 100  CO2 27 29  GLUCOSE 145* 119*  BUN 13 15  CREATININE 0.64 0.74  CALCIUM 8.4* 8.6*    CMET: Lab Results  Component Value Date   WBC 9.6 09/25/2020   HGB 13.6 09/25/2020   HCT 40.5 09/25/2020   PLT 213 09/25/2020   GLUCOSE 119 (H) 09/25/2020   ALT 73 (H) 09/16/2020   AST 42 (H) 09/16/2020   NA 137 09/25/2020   K 4.0 09/25/2020   CL 100 09/25/2020   CREATININE 0.74 09/25/2020   BUN 15 09/25/2020   CO2 29 09/25/2020   INR 1.0 09/16/2020  PT/INR: No results for input(s): LABPROT, INR in the last 72 hours. Radiology: No results found.   Assessment/Plan: S/P Procedure(s) (LRB): VIDEO BRONCHOSCOPY (N/A) Transhiatal total ESOPHAGECTOMY with Pyloroplasty (N/A) JEJUNOSTOMY (N/A) REMOVAL OF GASTROSTOMY TUBE Mobilize Patient tolerating tube feedings Plan Gastrografin swallow tomorrow and then decide on advancing p.o. diet Tomorrow convert to pain medication as liquid per J-tube    Grace Isaac 09/26/2020 11:10 AM

## 2020-09-27 ENCOUNTER — Inpatient Hospital Stay (HOSPITAL_COMMUNITY): Payer: BC Managed Care – PPO

## 2020-09-27 LAB — GLUCOSE, CAPILLARY
Glucose-Capillary: 132 mg/dL — ABNORMAL HIGH (ref 70–99)
Glucose-Capillary: 131 mg/dL — ABNORMAL HIGH (ref 70–99)
Glucose-Capillary: 142 mg/dL — ABNORMAL HIGH (ref 70–99)
Glucose-Capillary: 139 mg/dL — ABNORMAL HIGH (ref 70–99)
Glucose-Capillary: 136 mg/dL — ABNORMAL HIGH (ref 70–99)
Glucose-Capillary: 142 mg/dL — ABNORMAL HIGH (ref 70–99)

## 2020-09-27 MED ORDER — ACETAMINOPHEN 160 MG/5ML PO SOLN
325.0000 mg | ORAL | Status: DC | PRN
  Administered 2020-09-27 – 2020-09-28 (×2): 325 mg
  Filled 2020-09-27 (×3): qty 20.3

## 2020-09-27 MED ORDER — OXYCODONE HCL 5 MG/5ML PO SOLN
5.0000 mg | ORAL | Status: DC | PRN
  Administered 2020-09-27 – 2020-10-05 (×27): 5 mg
  Filled 2020-09-27 (×28): qty 5

## 2020-09-27 MED ORDER — IOHEXOL 300 MG/ML  SOLN
150.0000 mL | Freq: Once | INTRAMUSCULAR | Status: AC | PRN
  Administered 2020-09-27: 150 mL via ORAL

## 2020-09-27 MED ORDER — METOPROLOL TARTRATE 12.5 MG HALF TABLET
12.5000 mg | ORAL_TABLET | Freq: Two times a day (BID) | ORAL | Status: DC
  Administered 2020-09-27 – 2020-10-06 (×18): 12.5 mg
  Filled 2020-09-27 (×18): qty 1

## 2020-09-27 NOTE — Progress Notes (Addendum)
[address]       [place] [address]             [phone]      7 Days Post-Op Procedure(s) (LRB): VIDEO BRONCHOSCOPY (N/A) Transhiatal total ESOPHAGECTOMY with Pyloroplasty (N/A) JEJUNOSTOMY (N/A) REMOVAL OF GASTROSTOMY TUBE Subjective: For GG swallow today 2 slowly feeling stronger   Objective: Vital signs in last 24 hours: Temp:  [98.4 F (36.9 C)-99.2 F (37.3 C)] 98.4 F (36.9 C) (11/08 0354) Pulse Rate:  [101-115] 104 (11/08 0354) Cardiac Rhythm: Sinus tachycardia (11/07 1914) Resp:  [13-23] 13 (11/08 0544) BP: (121-159)/(78-97) 159/94 (11/08 0354) SpO2:  [91 %-97 %] 95 % (11/08 0544) Weight:  [86 kg] 86 kg (11/08 0354)  Hemodynamic parameters for last 24 hours:    Intake/Output from previous day: 11/07 0701 - 11/08 0700 In: 2404.5 [I.V.:396.9; NG/GT:1932.7] Out: 820 [Urine:820] Intake/Output this shift: No intake/output data recorded.  General appearance: alert, cooperative and no distress Heart: regular rate and rhythm and tachy Lungs: tubular in left base Abdomen: benign Extremities: no edema; or calf tenderness Wound: dressings intact, some drainage from abd incis- no erethema  Lab Results: Recent Labs    09/25/20 0206  WBC 9.6  HGB 13.6  HCT 40.5  PLT 213   BMET:  Recent Labs    09/25/20 0206  NA 137  K 4.0  CL 100  CO2 29  GLUCOSE 119*  BUN 15  CREATININE 0.74  CALCIUM 8.6*    PT/INR: No results for input(s): LABPROT, INR in the last 72 hours. ABG    Component Value Date/Time   PHART 7.396 09/21/2020 0530   HCO3 25.3 09/21/2020 0530   TCO2 24 09/20/2020 1645   ACIDBASEDEF 2.0 09/20/2020 1645   O2SAT 99.4 09/21/2020 0530   CBG (last 3)  Recent Labs    09/26/20 2010 09/26/20 2349 09/27/20 0356  GLUCAP 120* 130* 132*    Meds Scheduled Meds: . chlorhexidine  15 mL Mouth Rinse BID  . Chlorhexidine Gluconate Cloth  6 each Topical Daily  . enoxaparin (LOVENOX) injection  40 mg Subcutaneous  Daily  . feeding supplement (PROSource TF)  45 mL Per Tube TID  . HYDROmorphone   Intravenous Q4H  . insulin aspart  0-24 Units Subcutaneous Q4H  . lidocaine  1 patch Transdermal Q24H  . mouth rinse  15 mL Mouth Rinse q12n4p  . pantoprazole (PROTONIX) IV  40 mg Intravenous Q12H   Continuous Infusions: . dextrose 5 % and 0.9% NaCl 10 mL/hr at 09/27/20 0041  . feeding supplement (OSMOLITE 1.5 CAL) 1,000 mL (09/27/20 0338)   PRN Meds:.diphenhydrAMINE **OR** diphenhydrAMINE, metoprolol tartrate, naloxone **AND** sodium chloride flush, ondansetron (ZOFRAN) IV, phenol  Xrays No results found.  Assessment/Plan: S/P Procedure(s) (LRB): VIDEO BRONCHOSCOPY (N/A) Transhiatal total ESOPHAGECTOMY with Pyloroplasty (N/A) JEJUNOSTOMY (N/A) REMOVAL OF GASTROSTOMY TUBE  1 afeb, VSS- some systolic Htn, sinus tachy 2 sats ok on 4 liters 3 no new labs 4 sugars adeq controlled, not a diabetic      LOS: 7 days    John Giovanni PA-C [place] [phone] 09/27/2020  DG ESOPHAGUS W SINGLE CM (SOL OR THIN BA)  Result Date: 09/27/2020 CLINICAL DATA:  Esophagectomy evaluation prior to oral intake EXAM: WATER SOLUBLE UPPER GI SERIES TECHNIQUE: Single-column upper GI series was performed using water soluble contrast. CONTRAST:  Isotopic water-soluble contrast COMPARISON:  None similar FLUOROSCOPY TIME:  Fluoroscopy Time:  1.3 minutes Radiation Exposure Index (if provided by the  fluoroscopic device): 15.5 mGy Number of Acquired Spot Images: 2 FINDINGS: 2 scout images were acquired. No visible air leak. A high-density drain is seen in the left lateral neck. Multiple swallows were given to the patient and the operative site was interrogated in frontal and bilateral oblique projections. Well demonstrated transition from the upper esophagus to the gastric pull-through mucosa with no leak, stenosis, or stasis seen at this level. No high-density was seen tracking along the drain. IMPRESSION: Negative for leak or  obstruction at the anastomosis. Electronically Signed   By: Monte Fantasia M.D.   On: 09/27/2020 09:04   I have independently reviewed the above radiology studies  and reviewed the findings with the patient.   Patient tolerated Gastrografin swallow without difficulty, no obvious leak, no drainage from neck Penrose, gastric conduit appears to be emptying adequately  We will progress with "gentle" clear liquid diet, patient instructed to take liquids slowly, upright position and with chin tucked as much as possible.  We will convert to pain medication as needed from PCA to liquid per J-tube.  Incisions are examined are healing without obvious infection, especially in the area where the G-tube was

## 2020-09-27 NOTE — Progress Notes (Signed)
Dilaudid PCA DC at 1930 per order. 1.8mg  received by patient prior to DC. Waste 22mg  dilaudid in stericycle witnessed by Verdis Prime, RN.   Pt ambulated in halls today with front wheel walker and RN standby. Ambulated 175feet, tolerated well. Pt tired and slowed by end of walk. Sat in chair x4 hours. Up to bathroom x 2.

## 2020-09-27 NOTE — Progress Notes (Signed)
6 ml of PCA dilaudid wasted in stericycle with RN Joselyn.

## 2020-09-28 ENCOUNTER — Inpatient Hospital Stay (HOSPITAL_COMMUNITY): Payer: BC Managed Care – PPO

## 2020-09-28 LAB — GLUCOSE, CAPILLARY
Glucose-Capillary: 110 mg/dL — ABNORMAL HIGH (ref 70–99)
Glucose-Capillary: 115 mg/dL — ABNORMAL HIGH (ref 70–99)
Glucose-Capillary: 123 mg/dL — ABNORMAL HIGH (ref 70–99)
Glucose-Capillary: 125 mg/dL — ABNORMAL HIGH (ref 70–99)
Glucose-Capillary: 142 mg/dL — ABNORMAL HIGH (ref 70–99)
Glucose-Capillary: 144 mg/dL — ABNORMAL HIGH (ref 70–99)

## 2020-09-28 LAB — CBC
HCT: 39.5 % (ref 39.0–52.0)
Hemoglobin: 13 g/dL (ref 13.0–17.0)
MCH: 32.8 pg (ref 26.0–34.0)
MCHC: 32.9 g/dL (ref 30.0–36.0)
MCV: 99.7 fL (ref 80.0–100.0)
Platelets: 279 10*3/uL (ref 150–400)
RBC: 3.96 MIL/uL — ABNORMAL LOW (ref 4.22–5.81)
RDW: 13.8 % (ref 11.5–15.5)
WBC: 14.8 10*3/uL — ABNORMAL HIGH (ref 4.0–10.5)
nRBC: 0 % (ref 0.0–0.2)

## 2020-09-28 LAB — BASIC METABOLIC PANEL
Anion gap: 7 (ref 5–15)
BUN: 26 mg/dL — ABNORMAL HIGH (ref 6–20)
CO2: 30 mmol/L (ref 22–32)
Calcium: 8.5 mg/dL — ABNORMAL LOW (ref 8.9–10.3)
Chloride: 99 mmol/L (ref 98–111)
Creatinine, Ser: 0.75 mg/dL (ref 0.61–1.24)
GFR, Estimated: >60 mL/min (ref >60)
Glucose, Bld: 172 mg/dL — ABNORMAL HIGH (ref 70–99)
Potassium: 4.1 mmol/L (ref 3.5–5.1)
Sodium: 136 mmol/L (ref 135–145)

## 2020-09-28 LAB — URINALYSIS, ROUTINE W REFLEX MICROSCOPIC
Bilirubin Urine: NEGATIVE
Glucose, UA: NEGATIVE mg/dL
Hgb urine dipstick: NEGATIVE
Ketones, ur: NEGATIVE mg/dL
Leukocytes,Ua: NEGATIVE
Nitrite: NEGATIVE
Protein, ur: NEGATIVE mg/dL
Specific Gravity, Urine: 1.021 (ref 1.005–1.030)
pH: 8 (ref 5.0–8.0)

## 2020-09-28 MED ORDER — ACETAMINOPHEN 160 MG/5ML PO SOLN
650.0000 mg | Freq: Four times a day (QID) | ORAL | Status: DC | PRN
  Administered 2020-09-28 – 2020-10-06 (×10): 650 mg
  Filled 2020-09-28 (×10): qty 20.3

## 2020-09-28 NOTE — Progress Notes (Signed)
   09/28/20 1957  Assess: MEWS Score  Temp (!) 102.2 F (39 C)  BP 129/77  Pulse Rate (!) 109  ECG Heart Rate (!) 109  Resp 16  SpO2 95 %  O2 Device Nasal Cannula  O2 Flow Rate (L/min) 2 L/min  Assess: MEWS Score  MEWS Temp 2  MEWS Systolic 0  MEWS Pulse 1  MEWS RR 0  MEWS LOC 0  MEWS Score 3  MEWS Score Color Yellow  Assess: if the MEWS score is Yellow or Red  Were vital signs taken at a resting state? Yes  Focused Assessment No change from prior assessment  Early Detection of Sepsis Score *See Row Information* Low  MEWS guidelines implemented *See Row Information* No, previously yellow, continue vital signs every 4 hours  Escalate  MEWS: Escalate Yellow: discuss with charge nurse/RN and consider discussing with provider and RRT  Notify: Charge Nurse/RN  Name of Charge Nurse/RN Notified Tressie Ellis, RN  Date Charge Nurse/RN Notified 09/28/20  Time Charge Nurse/RN Notified 2000  Notify: Provider  Provider Name/Title Dr. Cyndia Bent  Date Provider Notified 09/28/20  Time Provider Notified 2010  Notification Type Call  Notification Reason Change in status  Response See new orders  Date of Provider Response 09/28/20  Time of Provider Response 2015  Document  Patient Outcome Stabilized after interventions  Progress note created (see row info) Yes

## 2020-09-28 NOTE — Progress Notes (Addendum)
      [address]       [place] [address]             [phone]     Tolerated a small amount of full liquids, not much appetite  Neck drain advanced approx 2 cm and redressed     John Giovanni, PA-C

## 2020-09-28 NOTE — Progress Notes (Signed)
Nutrition Follow-up  DOCUMENTATION CODES:   Not applicable  INTERVENTION:   Continue continuous tube feeds via J-tube: - Osmolite 1.5 @ 65 ml/hr (1560 ml/day) - ProSource 45 ml TID  Continuous tube feeding regimen provides 2460 kcal, 131 grams of protein, and 1189 ml of H2O.    RD will monitor for ability to transition to nocturnal tube feeds prior to discharge. Recommend: - Osmolite 1.5 @ 95 ml/hr to run over 16 hours from 1800 to 1000 (1520 ml) - ProSource 45 ml TID  Recommended nocturnal tube feeding regimen would provide 2400 kcal, 128 grams of protein, and 1158 ml of H2O.   NUTRITION DIAGNOSIS:   Increased nutrient needs related to post-op healing as evidenced by estimated needs.  Ongoing  GOAL:   Patient will meet greater than or equal to 90% of their needs  Met via TF  MONITOR:   Diet advancement, Skin, TF tolerance, Weight trends, Labs, I & O's  REASON FOR ASSESSMENT:   Rounds    ASSESSMENT:   Patient with PMH significant for bowel obstrcution, GERD, adenocarcinoma of the distal esophagus, and s/p PEG. Presents this admission for surgical management of distal esophagus adenocarcinoma.  11/01 - total esophagectomy with pyloroplasty, removal of G-tube, placement of J-tube 11/03 - TF started 11/08 - gastrografin swallow study with no obvious leak, diet advanced to clear liquids 11/09 - diet advanced to full liquids  Spoke with pt at bedside. Pt reports taking some juice and coffee this morning with breakfast. Noted diet advanced to full liquids today. Pt reports that he does not have much of an appetite.  Pt denies any abdominal pain, bloating, or nausea at this time. Will continue with current tube feeding regimen and monitor for ability to transition pt to nocturnal tube feeds prior to discharge. Discussed with CTS.  Pt requesting to have his temperature taken as he feels like he has a fever. RN aware.  Admit weight: 88.5 kg Current weight: 86.1  kg  Current TF: Osmolite 1.5 @ 65 ml/hr, ProSource TF 45 ml TID  Medications reviewed and include: SSI q 4 hours, IV protonix  Labs reviewed: BUN 26 CBG's: 115-144 x 24 hours  UOP: 250 ml x 24 hours I/O's: +10.0 L since admit  Diet Order:   Diet Order            Diet full liquid Room service appropriate? Yes; Fluid consistency: Thin  Diet effective 1000                 EDUCATION NEEDS:   Education needs have been addressed  Skin:  Skin Assessment: Skin Integrity Issues: Incisions: L neck, R abdomen  Last BM:  09/28/20 large type 7  Height:   Ht Readings from Last 1 Encounters:  09/21/20 _0  (1.753 m)    Weight:   Wt Readings from Last 1 Encounters:  09/28/20 86.1 kg    BMI:  Body mass index is 28.03 kg/m.  Estimated Nutritional Needs:   Kcal:  2450-2750 kcal  Protein:  125-145 grams  Fluid:  >/= 2 L/day    Gaynell Face, MS, RD, LDN Inpatient Clinical Dietitian Please see AMiON for contact information.

## 2020-09-28 NOTE — TOC Progression Note (Addendum)
Transition of Care [facility]) - Progression Note    Patient Details  Name: Thomas Gonzalez MRN: [id number] Date of Birth: [birth year]  Transition [facility] CM/SW Contact  Zenon Mayo, RN Phone Number: 09/28/2020, 4:37 PM  Clinical Narrative:    NCM spoke with Carolynn Sayers she states she will not be able to take this referral but she gave the referral to [facility] with [employer] for the tube feeds and supplies.  NCM spoke with patient and offered choice, he does not have a preference, NCM made referral to [facility] with Alvis Lemmings, Awaiting to hear back to see if they can take referral for [facility].  NCM informed Myron PA to sign the order for the tube feeds that is on the chart. Cory with Alvis Lemmings states he can take referral for [facility] for tube feeds.        Expected Discharge Plan and Services                                                 Social Determinants of Health (SDOH) Interventions    Readmission Risk Interventions No flowsheet data found.

## 2020-09-28 NOTE — Progress Notes (Addendum)
[address]       [place] [address]             [phone]      8 Days Post-Op Procedure(s) (LRB): VIDEO BRONCHOSCOPY (N/A) Transhiatal total ESOPHAGECTOMY with Pyloroplasty (N/A) JEJUNOSTOMY (N/A) REMOVAL OF GASTROSTOMY TUBE Subjective: Feels ok, tol liquids so far, + diarrhea  Objective: Vital signs in last 24 hours: Temp:  [97.6 F (36.4 C)-98.9 F (37.2 C)] 98.6 F (37 C) (11/09 0415) Pulse Rate:  [99-108] 99 (11/09 0415) Cardiac Rhythm: Normal sinus rhythm (11/09 0415) Resp:  [15-21] 18 (11/09 0451) BP: (133-147)/(89-98) 133/91 (11/09 0415) SpO2:  [92 %-98 %] 92 % (11/09 0451) Weight:  [86.1 kg] 86.1 kg (11/09 0452)  Hemodynamic parameters for last 24 hours:    Intake/Output from previous day: 11/08 0701 - 11/09 0700 In: 5643 [I.V.:120; NG/GT:1365] Out: 250 [Urine:250] Intake/Output this shift: No intake/output data recorded.  General appearance: alert, cooperative and no distress Heart: regular rate and rhythm Lungs: dim left base Abdomen: benign Extremities: no edema or calf tebderness Wound: healing without signs of infection  Lab Results: Recent Labs    09/28/20 0024  WBC 14.8*  HGB 13.0  HCT 39.5  PLT 279   BMET:  Recent Labs    09/28/20 0024  NA 136  K 4.1  CL 99  CO2 30  GLUCOSE 172*  BUN 26*  CREATININE 0.75  CALCIUM 8.5*    PT/INR: No results for input(s): LABPROT, INR in the last 72 hours. ABG    Component Value Date/Time   PHART 7.396 09/21/2020 0530   HCO3 25.3 09/21/2020 0530   TCO2 24 09/20/2020 1645   ACIDBASEDEF 2.0 09/20/2020 1645   O2SAT 99.4 09/21/2020 0530   CBG (last 3)  Recent Labs    09/27/20 2037 09/27/20 2322 09/28/20 0412  GLUCAP 136* 142* 115*    Meds Scheduled Meds: . chlorhexidine  15 mL Mouth Rinse BID  . Chlorhexidine Gluconate Cloth  6 each Topical Daily  . enoxaparin (LOVENOX) injection  40 mg Subcutaneous Daily  . feeding supplement (PROSource TF)  45 mL Per  Tube TID  . insulin aspart  0-24 Units Subcutaneous Q4H  . lidocaine  1 patch Transdermal Q24H  . mouth rinse  15 mL Mouth Rinse q12n4p  . metoprolol tartrate  12.5 mg Per Tube BID  . pantoprazole (PROTONIX) IV  40 mg Intravenous Q12H   Continuous Infusions: . dextrose 5 % and 0.9% NaCl Stopped (09/27/20 1900)  . feeding supplement (OSMOLITE 1.5 CAL) 65 mL/hr at 09/28/20 0400   PRN Meds:.oxyCODONE **AND** acetaminophen, ondansetron (ZOFRAN) IV, phenol  Xrays DG ESOPHAGUS W SINGLE CM (SOL OR THIN BA)  Result Date: 09/27/2020 CLINICAL DATA:  Esophagectomy evaluation prior to oral intake EXAM: WATER SOLUBLE UPPER GI SERIES TECHNIQUE: Single-column upper GI series was performed using water soluble contrast. CONTRAST:  Isotopic water-soluble contrast COMPARISON:  None similar FLUOROSCOPY TIME:  Fluoroscopy Time:  1.3 minutes Radiation Exposure Index (if provided by the fluoroscopic device): 15.5 mGy Number of Acquired Spot Images: 2 FINDINGS: 2 scout images were acquired. No visible air leak. A high-density drain is seen in the left lateral neck. Multiple swallows were given to the patient and the operative site was interrogated in frontal and bilateral oblique projections. Well demonstrated transition from the upper esophagus to the gastric pull-through mucosa with no leak, stenosis, or stasis seen at this level. No high-density was seen tracking along the drain. IMPRESSION: Negative  for leak or obstruction at the anastomosis. Electronically Signed   By: Monte Fantasia M.D.   On: 09/27/2020 09:04    Assessment/Plan: S/P Procedure(s) (LRB): VIDEO BRONCHOSCOPY (N/A) Transhiatal total ESOPHAGECTOMY with Pyloroplasty (N/A) JEJUNOSTOMY (N/A) REMOVAL OF GASTROSTOMY TUBE  1 afeb, VSS with some HTN at times 2 sats good on 2 liters 3 no anemic 4 some increase in WBC to 14.8 K- monitor for clinical signs of infection 5 normal renal fxn, UOP appears adeq but not measured, voided multiple times 6 CXR  - minor left effusion, atelec 7 going slow with liquid diet, conts TF's, + diarrhea 8 pain management 9 routine pulm toilet and rehab-  LOS: 8 days    John Giovanni PA-C pager [phone] 09/28/2020  No drainage from neck drain Bowels filling moved after Gastrogaffin  given yesterday Advance diet slowly I have seen and examined Thomas Gonzalez and agree with the above assessment  and plan.  Grace Isaac MD Beeper [phone] Office [phone] 09/28/2020 7:46 AM

## 2020-09-29 ENCOUNTER — Inpatient Hospital Stay (HOSPITAL_COMMUNITY): Payer: BC Managed Care – PPO

## 2020-09-29 LAB — GLUCOSE, CAPILLARY
Glucose-Capillary: 141 mg/dL — ABNORMAL HIGH (ref 70–99)
Glucose-Capillary: 113 mg/dL — ABNORMAL HIGH (ref 70–99)
Glucose-Capillary: 147 mg/dL — ABNORMAL HIGH (ref 70–99)
Glucose-Capillary: 151 mg/dL — ABNORMAL HIGH (ref 70–99)
Glucose-Capillary: 117 mg/dL — ABNORMAL HIGH (ref 70–99)
Glucose-Capillary: 153 mg/dL — ABNORMAL HIGH (ref 70–99)

## 2020-09-29 LAB — CBC
HCT: 41.9 % (ref 39.0–52.0)
Hemoglobin: 13.6 g/dL (ref 13.0–17.0)
MCH: 32.2 pg (ref 26.0–34.0)
MCHC: 32.5 g/dL (ref 30.0–36.0)
MCV: 99.1 fL (ref 80.0–100.0)
Platelets: 247 10*3/uL (ref 150–400)
RBC: 4.23 MIL/uL (ref 4.22–5.81)
RDW: 13.8 % (ref 11.5–15.5)
WBC: 10.7 10*3/uL — ABNORMAL HIGH (ref 4.0–10.5)
nRBC: 0 % (ref 0.0–0.2)

## 2020-09-29 MED ORDER — LEVOFLOXACIN IN D5W 750 MG/150ML IV SOLN
750.0000 mg | INTRAVENOUS | Status: DC
  Administered 2020-09-29 – 2020-10-06 (×8): 750 mg via INTRAVENOUS
  Filled 2020-09-29 (×8): qty 150

## 2020-09-29 MED ORDER — PANTOPRAZOLE SODIUM 40 MG PO PACK
40.0000 mg | PACK | Freq: Two times a day (BID) | ORAL | Status: DC
  Administered 2020-09-29 – 2020-10-06 (×14): 40 mg
  Filled 2020-09-29 (×14): qty 20

## 2020-09-29 NOTE — Progress Notes (Addendum)
[address]       [place] [address]             [phone]      9 Days Post-Op Procedure(s) (LRB): VIDEO BRONCHOSCOPY (N/A) Transhiatal total ESOPHAGECTOMY with Pyloroplasty (N/A) JEJUNOSTOMY (N/A) REMOVAL OF GASTROSTOMY TUBE Subjective: Started to have fevers, cultures ordered + chills Objective: Vital signs in last 24 hours: Temp:  [98.1 F (36.7 C)-102.2 F (39 C)] 98.1 F (36.7 C) (11/10 0430) Pulse Rate:  [93-128] 110 (11/10 0356) Cardiac Rhythm: Sinus tachycardia (11/10 0334) Resp:  [15-20] 16 (11/10 0328) BP: (129-148)/(77-96) 148/96 (11/10 0328) SpO2:  [95 %-98 %] 96 % (11/10 0328) Weight:  [84.7 kg] 84.7 kg (11/10 0328)  Hemodynamic parameters for last 24 hours:    Intake/Output from previous day: 11/09 0701 - 11/10 0700 In: 2600 [P.O.:480; I.V.:240; NG/GT:1690] Out: 800 [Urine:800] Intake/Output this shift: No intake/output data recorded.  General appearance: alert, cooperative and no distress Heart: regular rate and rhythm and tachy Lungs: min dim in bases Abdomen: benign Extremities: no edema or calf tenderness Wound: + drainage from neck incis this am  Lab Results: Recent Labs    09/28/20 0024 09/29/20 0026  WBC 14.8* 10.7*  HGB 13.0 13.6  HCT 39.5 41.9  PLT 279 247   BMET:  Recent Labs    09/28/20 0024  NA 136  K 4.1  CL 99  CO2 30  GLUCOSE 172*  BUN 26*  CREATININE 0.75  CALCIUM 8.5*    PT/INR: No results for input(s): LABPROT, INR in the last 72 hours. ABG    Component Value Date/Time   PHART 7.396 09/21/2020 0530   HCO3 25.3 09/21/2020 0530   TCO2 24 09/20/2020 1645   ACIDBASEDEF 2.0 09/20/2020 1645   O2SAT 99.4 09/21/2020 0530   CBG (last 3)  Recent Labs    09/28/20 1959 09/28/20 2340 09/29/20 0331  GLUCAP 123* 125* 141*    Meds Scheduled Meds: . chlorhexidine  15 mL Mouth Rinse BID  . Chlorhexidine Gluconate Cloth  6 each Topical Daily  . enoxaparin (LOVENOX) injection  40 mg  Subcutaneous Daily  . feeding supplement (PROSource TF)  45 mL Per Tube TID  . insulin aspart  0-24 Units Subcutaneous Q4H  . lidocaine  1 patch Transdermal Q24H  . mouth rinse  15 mL Mouth Rinse q12n4p  . metoprolol tartrate  12.5 mg Per Tube BID  . pantoprazole (PROTONIX) IV  40 mg Intravenous Q12H   Continuous Infusions: . dextrose 5 % and 0.9% NaCl Stopped (09/27/20 1900)  . feeding supplement (OSMOLITE 1.5 CAL) 1,000 mL (09/29/20 0704)   PRN Meds:.acetaminophen (TYLENOL) oral liquid 160 mg/5 mL, ondansetron (ZOFRAN) IV, oxyCODONE **AND** [DISCONTINUED] acetaminophen, phenol  Xrays DG Chest 2 View  Result Date: 09/28/2020 CLINICAL DATA:  Follow-up pneumothorax EXAM: CHEST - 2 VIEW COMPARISON:  3 days ago FINDINGS: Postoperative mediastinum for gastric pull-through. Small pleural effusions and lower lobe atelectasis. Small left apical pneumothorax. No pulmonary edema. Porta catheter with tip at the SVC. IMPRESSION: 1. Trace left apical pneumothorax. 2. Small pleural effusions and lower lobe atelectasis. Electronically Signed   By: Monte Fantasia M.D.   On: 09/28/2020 07:58   DG ESOPHAGUS W SINGLE CM (SOL OR THIN BA)  Result Date: 09/27/2020 CLINICAL DATA:  Esophagectomy evaluation prior to oral intake EXAM: WATER SOLUBLE UPPER GI SERIES TECHNIQUE: Single-column upper GI series was performed using water soluble contrast. CONTRAST:  Isotopic water-soluble contrast COMPARISON:  None  similar FLUOROSCOPY TIME:  Fluoroscopy Time:  1.3 minutes Radiation Exposure Index (if provided by the fluoroscopic device): 15.5 mGy Number of Acquired Spot Images: 2 FINDINGS: 2 scout images were acquired. No visible air leak. A high-density drain is seen in the left lateral neck. Multiple swallows were given to the patient and the operative site was interrogated in frontal and bilateral oblique projections. Well demonstrated transition from the upper esophagus to the gastric pull-through mucosa with no leak,  stenosis, or stasis seen at this level. No high-density was seen tracking along the drain. IMPRESSION: Negative for leak or obstruction at the anastomosis. Electronically Signed   By: Monte Fantasia M.D.   On: 09/27/2020 09:04    Assessment/Plan: S/P Procedure(s) (LRB): VIDEO BRONCHOSCOPY (N/A) Transhiatal total ESOPHAGECTOMY with Pyloroplasty (N/A) JEJUNOSTOMY (N/A) REMOVAL OF GASTROSTOMY TUBE  1 tmax 102.2- cultures ordered, will order CXR 2 sinus tachy with some HTN at times 3 sats ok on 3 liters 4 leukocytosis improved with WBC now 10.7 5 not anemic 6 will culture neck drainage, incis itself looks ok, hopefully wont need to I+ D, will need to consider abx   LOS: 9 days    Thomas Giovanni PA-C Pager [phone] 09/29/2020 Results for orders placed or performed during the hospital encounter of 09/20/20  Culture, blood (routine x 2)     Status: None (Preliminary result)   Collection Time: 09/28/20  8:40 PM   Specimen: BLOOD  Result Value Ref Range Status   Specimen Description BLOOD LEFT ARM  Final   Special Requests   Final    BOTTLES DRAWN AEROBIC AND ANAEROBIC Blood Culture adequate volume   Culture   Final    NO GROWTH < 12 HOURS Performed at [facility], [place] [address], [place], [place] [address]    Report Status PENDING  Incomplete  Culture, blood (routine x 2)     Status: None (Preliminary result)   Collection Time: 09/28/20  8:45 PM   Specimen: BLOOD  Result Value Ref Range Status   Specimen Description BLOOD RIGHT ARM  Final   Special Requests   Final    BOTTLES DRAWN AEROBIC AND ANAEROBIC Blood Culture adequate volume   Culture   Final    NO GROWTH < 12 HOURS Performed at [facility], [place] [address] [place],  [address]    Report Status PENDING  Incomplete   Temp up last night with chills Chest xray this am unchanged Urine culture pending  abdominal wound with out erythremia of incision or g tube site  Serosanguineous drainge at  lower third of abdominal incision -stat Gram stain and wound culture sent Very little drainage from the neck incision, and definitely no evidence of color drinks leaking   Severe PCN allergy- will start Levaquin pending cultures  I have seen and examined Lupe Carney and agree with the above assessment  and plan.  Grace Isaac MD Beeper [phone] Office [phone] 09/29/2020 9:38 AM

## 2020-09-29 NOTE — Plan of Care (Signed)
  Problem: Education: Goal: Knowledge of General Education information will improve Description: Including pain rating scale, medication(s)/side effects and non-pharmacologic comfort measures Outcome: Progressing   Problem: Clinical Measurements: Goal: Ability to maintain clinical measurements within normal limits will improve Outcome: Progressing Goal: Respiratory complications will improve Outcome: Progressing   Problem: Activity: Goal: Risk for activity intolerance will decrease Outcome: Progressing   Problem: Nutrition: Goal: Adequate nutrition will be maintained Outcome: Progressing

## 2020-09-30 ENCOUNTER — Inpatient Hospital Stay (HOSPITAL_COMMUNITY): Payer: BC Managed Care – PPO

## 2020-09-30 LAB — CBC
HCT: 41 % (ref 39.0–52.0)
HCT: 39 % (ref 39.0–52.0)
Hemoglobin: 13.5 g/dL (ref 13.0–17.0)
Hemoglobin: 12.9 g/dL — ABNORMAL LOW (ref 13.0–17.0)
MCH: 32.6 pg (ref 26.0–34.0)
MCH: 32.1 pg (ref 26.0–34.0)
MCHC: 32.9 g/dL (ref 30.0–36.0)
MCHC: 33.1 g/dL (ref 30.0–36.0)
MCV: 99 fL (ref 80.0–100.0)
MCV: 97 fL (ref 80.0–100.0)
Platelets: 232 10*3/uL (ref 150–400)
Platelets: 225 10*3/uL (ref 150–400)
RBC: 4.14 MIL/uL — ABNORMAL LOW (ref 4.22–5.81)
RBC: 4.02 MIL/uL — ABNORMAL LOW (ref 4.22–5.81)
RDW: 13.7 % (ref 11.5–15.5)
RDW: 13.5 % (ref 11.5–15.5)
WBC: 10.4 10*3/uL (ref 4.0–10.5)
WBC: 11.2 10*3/uL — ABNORMAL HIGH (ref 4.0–10.5)
nRBC: 0 % (ref 0.0–0.2)
nRBC: 0 % (ref 0.0–0.2)

## 2020-09-30 LAB — BASIC METABOLIC PANEL
Anion gap: 11 (ref 5–15)
Anion gap: 9 (ref 5–15)
BUN: 23 mg/dL — ABNORMAL HIGH (ref 6–20)
BUN: 22 mg/dL — ABNORMAL HIGH (ref 6–20)
CO2: 24 mmol/L (ref 22–32)
CO2: 24 mmol/L (ref 22–32)
Calcium: 8.5 mg/dL — ABNORMAL LOW (ref 8.9–10.3)
Calcium: 8.4 mg/dL — ABNORMAL LOW (ref 8.9–10.3)
Chloride: 101 mmol/L (ref 98–111)
Chloride: 102 mmol/L (ref 98–111)
Creatinine, Ser: 0.85 mg/dL (ref 0.61–1.24)
Creatinine, Ser: 0.76 mg/dL (ref 0.61–1.24)
GFR, Estimated: >60 mL/min (ref >60)
GFR, Estimated: >60 mL/min (ref >60)
Glucose, Bld: 142 mg/dL — ABNORMAL HIGH (ref 70–99)
Glucose, Bld: 154 mg/dL — ABNORMAL HIGH (ref 70–99)
Potassium: 4 mmol/L (ref 3.5–5.1)
Potassium: 4.2 mmol/L (ref 3.5–5.1)
Sodium: 136 mmol/L (ref 135–145)
Sodium: 135 mmol/L (ref 135–145)

## 2020-09-30 LAB — URINE CULTURE

## 2020-09-30 LAB — GLUCOSE, CAPILLARY
Glucose-Capillary: 131 mg/dL — ABNORMAL HIGH (ref 70–99)
Glucose-Capillary: 141 mg/dL — ABNORMAL HIGH (ref 70–99)
Glucose-Capillary: 143 mg/dL — ABNORMAL HIGH (ref 70–99)
Glucose-Capillary: 130 mg/dL — ABNORMAL HIGH (ref 70–99)
Glucose-Capillary: 124 mg/dL — ABNORMAL HIGH (ref 70–99)
Glucose-Capillary: 110 mg/dL — ABNORMAL HIGH (ref 70–99)

## 2020-09-30 NOTE — Plan of Care (Signed)

## 2020-09-30 NOTE — Plan of Care (Signed)
°  Problem: Education: Goal: Knowledge of General Education information will improve Description: Including pain rating scale, medication(s)/side effects and non-pharmacologic comfort measures Outcome: Progressing   Problem: Health Behavior/Discharge Planning: Goal: Ability to manage health-related needs will improve Outcome: Progressing   Problem: Clinical Measurements: Goal: Ability to maintain clinical measurements within normal limits will improve Outcome: Progressing   Problem: Clinical Measurements: Goal: Diagnostic test results will improve Outcome: Progressing   Problem: Activity: Goal: Risk for activity intolerance will decrease Outcome: Progressing   Problem: Nutrition: Goal: Adequate nutrition will be maintained Outcome: Progressing   Problem: Pain Managment: Goal: General experience of comfort will improve Outcome: Progressing   Problem: Skin Integrity: Goal: Risk for impaired skin integrity will decrease Outcome: Progressing

## 2020-09-30 NOTE — Progress Notes (Addendum)
[address]       [place] [address]             [phone]      10 Days Post-Op Procedure(s) (LRB): VIDEO BRONCHOSCOPY (N/A) Transhiatal total ESOPHAGECTOMY with Pyloroplasty (N/A) JEJUNOSTOMY (N/A) REMOVAL OF GASTROSTOMY TUBE Subjective: Feels fair, fatigued, some chills  Objective: Vital signs in last 24 hours: Temp:  [98.8 F (37.1 C)-103.1 F (39.5 C)] 98.8 F (37.1 C) (11/11 0600) Pulse Rate:  [90-115] 90 (11/11 0600) Cardiac Rhythm: Sinus tachycardia (11/11 0310) Resp:  [13-23] 17 (11/11 0600) BP: (118-165)/(78-92) 142/92 (11/11 0600) SpO2:  [89 %-96 %] 93 % (11/11 0600) Weight:  [83.6 kg] 83.6 kg (11/11 0500)  Hemodynamic parameters for last 24 hours:    Intake/Output from previous day: 11/10 0701 - 11/11 0700 In: 1335 [P.O.:600; NG/GT:585; IV Piggyback:150] Out: 725 [Urine:725] Intake/Output this shift: No intake/output data recorded.  General appearance: alert, cooperative, distracted, fatigued and no distress Heart: regular rate and rhythm and tachy Lungs: mildly dim in bases Abdomen: benign Extremities: no edema or calf tenderness Wound: incis healing well, but both neck and abdominal incis with some drainage  Lab Results: Recent Labs    09/30/20 0000 09/30/20 0549  WBC 10.4 11.2*  HGB 13.5 12.9*  HCT 41.0 39.0  PLT 232 225   BMET:  Recent Labs    09/30/20 0000 09/30/20 0549  NA 136 135  K 4.0 4.2  CL 101 102  CO2 24 24  GLUCOSE 142* 154*  BUN 23* 22*  CREATININE 0.85 0.76  CALCIUM 8.5* 8.4*    PT/INR: No results for input(s): LABPROT, INR in the last 72 hours. ABG    Component Value Date/Time   PHART 7.396 09/21/2020 0530   HCO3 25.3 09/21/2020 0530   TCO2 24 09/20/2020 1645   ACIDBASEDEF 2.0 09/20/2020 1645   O2SAT 99.4 09/21/2020 0530   CBG (last 3)  Recent Labs    09/29/20 1928 09/29/20 2329 09/30/20 0327  GLUCAP 117* 153* 131*    Meds Scheduled Meds: . chlorhexidine  15 mL Mouth Rinse  BID  . Chlorhexidine Gluconate Cloth  6 each Topical Daily  . enoxaparin (LOVENOX) injection  40 mg Subcutaneous Daily  . feeding supplement (PROSource TF)  45 mL Per Tube TID  . insulin aspart  0-24 Units Subcutaneous Q4H  . lidocaine  1 patch Transdermal Q24H  . mouth rinse  15 mL Mouth Rinse q12n4p  . metoprolol tartrate  12.5 mg Per Tube BID  . pantoprazole sodium  40 mg Per Tube BID   Continuous Infusions: . dextrose 5 % and 0.9% NaCl Stopped (09/27/20 1900)  . feeding supplement (OSMOLITE 1.5 CAL) 1,000 mL (09/30/20 0013)  . levofloxacin (LEVAQUIN) IV Stopped (09/29/20 1943)   PRN Meds:.acetaminophen (TYLENOL) oral liquid 160 mg/5 mL, ondansetron (ZOFRAN) IV, oxyCODONE **AND** [DISCONTINUED] acetaminophen, phenol   Results for orders placed or performed during the hospital encounter of 09/20/20  Culture, Urine     Status: Abnormal   Collection Time: 09/28/20  8:15 PM   Specimen: Urine, Random  Result Value Ref Range Status   Specimen Description URINE, RANDOM  Final   Special Requests   Final    NONE Performed at [facility], [place] [address] [place], [place] [address]    Culture MULTIPLE SPECIES PRESENT, SUGGEST RECOLLECTION (A)  Final   Report Status 09/30/2020 FINAL  Final  Culture, blood (routine x 2)     Status:  None (Preliminary result)   Collection Time: 09/28/20  8:40 PM   Specimen: BLOOD  Result Value Ref Range Status   Specimen Description BLOOD LEFT ARM  Final   Special Requests   Final    BOTTLES DRAWN AEROBIC AND ANAEROBIC Blood Culture adequate volume   Culture   Final    NO GROWTH < 12 HOURS Performed at [facility], [address], [place], [place] [address]    Report Status PENDING  Incomplete  Culture, blood (routine x 2)     Status: None (Preliminary result)   Collection Time: 09/28/20  8:45 PM   Specimen: BLOOD  Result Value Ref Range Status   Specimen Description BLOOD RIGHT ARM  Final   Special Requests   Final    BOTTLES DRAWN  AEROBIC AND ANAEROBIC Blood Culture adequate volume   Culture   Final    NO GROWTH < 12 HOURS Performed at [facility], [place] [address], [place], [place] [address]    Report Status PENDING  Incomplete  Aerobic/Anaerobic Culture (surgical/deep wound)     Status: None (Preliminary result)   Collection Time: 09/29/20  8:55 AM   Specimen: Wound  Result Value Ref Range Status   Specimen Description WOUND  Final   Special Requests ABDOMEN  Final   Gram Stain   Final    NO WBC SEEN NO ORGANISMS SEEN Performed at [facility], [address], [place], [place] [address]    Culture PENDING  Incomplete   Report Status PENDING  Incomplete     Xrays DG Chest 2 View  Result Date: 09/29/2020 CLINICAL DATA:  Recent esophagectomy with fevers, initial encounter EXAM: CHEST - 2 VIEW COMPARISON:  09/28/2020 FINDINGS: Cardiac shadow is within normal limits. The lungs are well aerated bilaterally. Changes of prior esophagectomy with gastric pull-through are seen. Right-sided chest wall port is again noted and stable. Stable consolidation is noted in the left retrocardiac region. Small effusions are noted bilaterally. IMPRESSION: No change from the prior exam with small effusions bilaterally and left retrocardiac consolidation. Electronically Signed   By: Inez Catalina M.D.   On: 09/29/2020 08:28    Assessment/Plan: S/P Procedure(s) (LRB): VIDEO BRONCHOSCOPY (N/A) Transhiatal total ESOPHAGECTOMY with Pyloroplasty (N/A) JEJUNOSTOMY (N/A) REMOVAL OF GASTROSTOMY TUBE  1 Tmax 103.1, + HTN and tachy at times 2 sats good on RA 3 slight increase in leukocytosis, cultures unremarkable except Urine with mult species- will recollect, cont levaquin 4 stable normal renal fxn with good UOP- frequent voids. Not measured  5 BS adeq controlled 6 encouraged ambulation, none yesterday because he felt bad, cont pulm toilet  LOS: 10 days    Thomas Giovanni PA-C Pager [phone] 09/30/2020  U/a clear  , likely contamination on clean cath urine culture - re culture Penrose in neck very little drainage , penrose advanced out slightly- no neck swelling or pain Abdominal wound - culture negative so far , small amount serous drainage on dressing not changed since yesterday  Blood cultures negative  Will get non contrast ct chest abdomen  To see if any other cause for fever I have seen and examined Lupe Carney and agree with the above assessment  and plan.  Grace Isaac MD Beeper [phone] Office [phone] 09/30/2020 8:39 AM

## 2020-10-01 LAB — URINE CULTURE: Culture: <10000 — AB

## 2020-10-01 LAB — GLUCOSE, CAPILLARY
Glucose-Capillary: 110 mg/dL — ABNORMAL HIGH (ref 70–99)
Glucose-Capillary: 126 mg/dL — ABNORMAL HIGH (ref 70–99)
Glucose-Capillary: 128 mg/dL — ABNORMAL HIGH (ref 70–99)
Glucose-Capillary: 133 mg/dL — ABNORMAL HIGH (ref 70–99)
Glucose-Capillary: 140 mg/dL — ABNORMAL HIGH (ref 70–99)
Glucose-Capillary: 148 mg/dL — ABNORMAL HIGH (ref 70–99)

## 2020-10-01 LAB — CBC
HCT: 38.5 % — ABNORMAL LOW (ref 39.0–52.0)
Hemoglobin: 13 g/dL (ref 13.0–17.0)
MCH: 33 pg (ref 26.0–34.0)
MCHC: 33.8 g/dL (ref 30.0–36.0)
MCV: 97.7 fL (ref 80.0–100.0)
Platelets: 252 10*3/uL (ref 150–400)
RBC: 3.94 MIL/uL — ABNORMAL LOW (ref 4.22–5.81)
RDW: 13.8 % (ref 11.5–15.5)
WBC: 12.1 10*3/uL — ABNORMAL HIGH (ref 4.0–10.5)
nRBC: 0 % (ref 0.0–0.2)

## 2020-10-01 LAB — BASIC METABOLIC PANEL
Anion gap: 8 (ref 5–15)
BUN: 25 mg/dL — ABNORMAL HIGH (ref 6–20)
CO2: 26 mmol/L (ref 22–32)
Calcium: 8.5 mg/dL — ABNORMAL LOW (ref 8.9–10.3)
Chloride: 98 mmol/L (ref 98–111)
Creatinine, Ser: 0.87 mg/dL (ref 0.61–1.24)
GFR, Estimated: >60 mL/min (ref >60)
Glucose, Bld: 131 mg/dL — ABNORMAL HIGH (ref 70–99)
Potassium: 4.1 mmol/L (ref 3.5–5.1)
Sodium: 132 mmol/L — ABNORMAL LOW (ref 135–145)

## 2020-10-01 NOTE — Hospital Course (Addendum)
History of Present Illness:    Thomas Gonzalez 54 y.o. 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  The patient notes that in March 2021 he had a "stomachache" this was made worse by eating but at the time he did not have obstructive symptoms.  He sought medical attention at that time when he noted dark stools he was noted to be found anemic was given IV iron and ultimately May 18, 2020 he had upper GI endoscopy and a colonoscopy.  Upper GI endoscopy dated May 18, 2020 by Dr. Lyda Jester shows esophageal distal mass friable tissue with ulceration at approximately  38 cm extending to the gastric cardia at approximately 40 cm this was described as circumferential the mass was non obstructing. There was additional mass approximately 1-1/2 cm in length between 30 and 31 cm both of these areas were biopsied and confirmed moderate to poorly differentiated adenocarcinoma [facility] health pathology report S CF 21-12 71.    A midline PEG tube was placed-the patient notes that he was never obstructed and is never used the PEG tube    CT scan of the chest and abdomen was done as was the PET scan the patient has not had staging esophageal ultrasound/EUS.  Based on his CT and PET scan he was staged clinically  STAGE IVA .  He has been treated with 3 cycles of FOLFOX with concurrent radiation therapy done at [facility].  His last dose of chemotherapy was on August 24.  Last dose of radiation September 7.  The patient notes that he has tolerated his treatments relatively well he has been careful about chewing his food and has made efforts to maintain his weight, as result he has not had significant weight loss over this..   Follow-up CT of the chest and abdomen was done and the patient was referred to consider surgical treatment   Previous abdominal surgery includes appendectomy in 2018, he has had inguinal hernias repaired surgically x3   Patient is a  non-smoker, he has had no previous cardiac history   Hospital Course: Patient was admitted electively and on 09/20/2020 taken the operating room where he underwent the below described procedure.  He tolerated it well and was taken to the surgical intensive care unit in stable condition.   Postoperative hospital course:   The patient has overall done quite well.  He was extubated from the ventilator without difficulty evening of surgery.  He is remained hemodynamic stable.  He has had a steady bowel function and J-tube feedings have been initiated in the standard fashion.  He does have a very mild expected acute blood loss anemia which is stable.  Is tolerating gradually increasing activities using standard protocols.  A swallow study was performed on 09/27/2020 which did not show any evidence of leak.  He has been started on a course of advancement in diet.  On 09/30/2019 when he did develop fevers and he has undergone a course of fever work-up.  There is a slight amount of drainage from the neck incision as well as from the abdominal incision and has been started on a course of Levaquin.  This incision was opened on 10/05/2020 begin a course of packing with saline.  He has been transitioned to a dysphagia 3 diet with nighttime G-tube feedings see assistance of dietitian.  However arrangements are being made for wound care as well as assistance with these feedings.  He is tolerating gradually  increasing activities using standard protocols.  Oxygen has been weaned and he maintains good saturations on room air.  Neck incision at this time is stable and the drain has been discontinued.

## 2020-10-01 NOTE — Progress Notes (Addendum)
[address]       [place] [address]             [phone]      11 Days Post-Op Procedure(s) (LRB): VIDEO BRONCHOSCOPY (N/A) Transhiatal total ESOPHAGECTOMY with Pyloroplasty (N/A) JEJUNOSTOMY (N/A) REMOVAL OF GASTROSTOMY TUBE Subjective:  fever curve improving Feels a little better overall, more active, eating some  Objective: Vital signs in last 24 hours: Temp:  [98 F (36.7 C)-101.2 F (38.4 C)] 101.2 F (38.4 C) (11/12 0325) Pulse Rate:  [90-110] 110 (11/12 0325) Cardiac Rhythm: Normal sinus rhythm (11/12 0700) Resp:  [17-21] 17 (11/12 0325) BP: (123-148)/(77-105) 148/90 (11/12 0325) SpO2:  [92 %-96 %] 92 % (11/12 0325) Weight:  [84.3 kg] 84.3 kg (11/12 0325)  Hemodynamic parameters for last 24 hours:    Intake/Output from previous day: 11/11 0701 - 11/12 0700 In: 3482.8 [P.O.:840; NG/GT:2367.1; IV Piggyback:275.7] Out: 375 [Urine:375] Intake/Output this shift: No intake/output data recorded.  General appearance: alert, cooperative and no distress Heart: regular rate and rhythm Lungs: dim left>right base Abdomen: benign Extremities: no edema or calf tenderness Wound: dressings changed recentle, no current drainage , no cellulitis  Lab Results: Recent Labs    09/30/20 0549 10/01/20 0040  WBC 11.2* 12.1*  HGB 12.9* 13.0  HCT 39.0 38.5*  PLT 225 252   BMET:  Recent Labs    09/30/20 0549 10/01/20 0040  NA 135 132*  K 4.2 4.1  CL 102 98  CO2 24 26  GLUCOSE 154* 131*  BUN 22* 25*  CREATININE 0.76 0.87  CALCIUM 8.4* 8.5*    PT/INR: No results for input(s): LABPROT, INR in the last 72 hours. ABG    Component Value Date/Time   PHART 7.396 09/21/2020 0530   HCO3 25.3 09/21/2020 0530   TCO2 24 09/20/2020 1645   ACIDBASEDEF 2.0 09/20/2020 1645   O2SAT 99.4 09/21/2020 0530   CBG (last 3)  Recent Labs    09/30/20 2016 09/30/20 2315 10/01/20 0335  GLUCAP 124* 110* 140*    Meds Scheduled Meds: . chlorhexidine   15 mL Mouth Rinse BID  . Chlorhexidine Gluconate Cloth  6 each Topical Daily  . enoxaparin (LOVENOX) injection  40 mg Subcutaneous Daily  . feeding supplement (PROSource TF)  45 mL Per Tube TID  . insulin aspart  0-24 Units Subcutaneous Q4H  . lidocaine  1 patch Transdermal Q24H  . mouth rinse  15 mL Mouth Rinse q12n4p  . metoprolol tartrate  12.5 mg Per Tube BID  . pantoprazole sodium  40 mg Per Tube BID   Continuous Infusions: . dextrose 5 % and 0.9% NaCl Stopped (09/27/20 1900)  . feeding supplement (OSMOLITE 1.5 CAL) 1,000 mL (09/30/20 1949)  . levofloxacin (LEVAQUIN) IV 750 mg (09/30/20 0958)   PRN Meds:.acetaminophen (TYLENOL) oral liquid 160 mg/5 mL, ondansetron (ZOFRAN) IV, oxyCODONE **AND** [DISCONTINUED] acetaminophen, phenol  Xrays CT ABDOMEN PELVIS WO CONTRAST/  CT CHEST WO CONTRAST  Addendum Date: 09/30/2020   ADDENDUM REPORT: 09/30/2020 11:33 ADDENDUM: Critical Value/emergent results were called by telephone at the time of interpretation on 09/30/2020 at 10:45 am to provider [facility] , who verbally acknowledged these results. Electronically Signed   By: Ilona Sorrel M.D.   On: 09/30/2020 11:33   Result Date: 09/30/2020 CLINICAL DATA:  Fever of unknown origin. Esophageal cancer status post transhiatal total esophagectomy 09/20/2020. Inpatient. EXAM: CT CHEST, ABDOMEN AND PELVIS WITHOUT CONTRAST TECHNIQUE: Multidetector CT imaging of the chest, abdomen and pelvis  was performed following the standard protocol without IV contrast. COMPARISON:  Chest radiograph from one day prior. 08/25/2020 CT chest, abdomen and pelvis. FINDINGS: CT CHEST FINDINGS Cardiovascular: Normal heart size. No significant pericardial effusion/thickening. Three-vessel coronary atherosclerosis. Great vessels are normal in course and caliber. Right subclavian Port-A-Cath terminates in the lower third of the SVC. Mediastinum/Nodes: No discrete thyroid nodules. Interval thoracic esophagectomy with gastric  pull-through with esophagogastric anastomosis in the upper mediastinum. Surgical drain terminates in the left supraclavicular neck. A few tiny foci of apparent free air and scattered ill-defined fat stranding and minimal fluid in the upper mediastinum, likely compatible with recent surgery, with no discrete measurable mediastinal fluid collection on this noncontrast study. No pathologically enlarged axillary, mediastinal or hilar lymph nodes, noting limited sensitivity for the detection of hilar adenopathy on this noncontrast study. Lungs/Pleura: Small left hydropneumothorax with less than 5% pneumothorax component anteriorly. No right pleural effusion. No right pneumothorax. Ground-glass indistinct 1.3 x 1.1 cm superior segment right lower lobe pulmonary nodule (series 4/image 66), unchanged since 05/21/2020 chest CT. Moderate platelike atelectasis in the basilar left lower lobe. No lung masses or additional significant pulmonary nodules. Musculoskeletal: No aggressive appearing focal osseous lesions. Mild thoracic spondylosis. CT ABDOMEN PELVIS FINDINGS Hepatobiliary: Normal liver with no liver mass. Normal gallbladder with no radiopaque cholelithiasis. No biliary ductal dilatation. Pancreas: Normal, with no mass or duct dilation. Spleen: Normal size. No mass. Adrenals/Urinary Tract: Normal adrenals. No renal stones. No hydronephrosis. Stable small lower left parapelvic renal cyst. No contour deforming renal masses. Normal bladder. Stomach/Bowel: Status post esophagectomy with gastric pull-through. Stomach is not significantly distended with no definite gastric wall thickening. Percutaneous jejunostomy tube terminates in the left abdomen. No dilated or thick-walled small bowel loops. Appendectomy. Oral contrast transits to the rectum. Mild sigmoid diverticulosis with no large bowel wall thickening or significant pericolonic fat stranding. Vascular/Lymphatic: Mildly atherosclerotic nonaneurysmal abdominal aorta. No  pathologically enlarged lymph nodes in the abdomen or pelvis. Reproductive: Normal size prostate. Other: No pneumoperitoneum, ascites or focal fluid collection. Stable postsurgical changes from left inguinal hernia repair without recurrent hernia. Musculoskeletal: No aggressive appearing focal osseous lesions. Mild lumbar spondylosis. IMPRESSION: 1. Small left hydropneumothorax with less than 5% pneumothorax component anteriorly. Moderate platelike atelectasis in the basilar left lower lobe. 2. Status post esophagectomy with gastric pull-through. A few tiny foci of apparent free air and scattered ill-defined fat stranding and minimal fluid in the upper mediastinum, likely compatible with recent surgery, with no discrete measurable mediastinal fluid collection on this noncontrast study. 3. No acute abnormality in the abdomen or pelvis. Percutaneous jejunostomy tube terminates in the left abdomen. 4. Three-vessel coronary atherosclerosis. 5. Ground-glass indistinct 1.3 cm superior segment right lower lobe pulmonary nodule, unchanged since 05/21/2020 chest CT. Recommend attention on follow-up chest CT in 6-12 months. 6. Aortic Atherosclerosis (ICD10-I70.0). Electronically Signed: By: Ilona Sorrel M.D. On: 09/30/2020 10:09   DG Chest 2 View  Result Date: 09/29/2020 CLINICAL DATA:  Recent esophagectomy with fevers, initial encounter EXAM: CHEST - 2 VIEW COMPARISON:  09/28/2020 FINDINGS: Cardiac shadow is within normal limits. The lungs are well aerated bilaterally. Changes of prior esophagectomy with gastric pull-through are seen. Right-sided chest wall port is again noted and stable. Stable consolidation is noted in the left retrocardiac region. Small effusions are noted bilaterally. IMPRESSION: No change from the prior exam with small effusions bilaterally and left retrocardiac consolidation. Electronically Signed   By: Inez Catalina M.D.   On: 09/29/2020 08:28    Results for  orders placed or performed during  the hospital encounter of 09/20/20  Culture, Urine     Status: Abnormal   Collection Time: 09/28/20  8:15 PM   Specimen: Urine, Random  Result Value Ref Range Status   Specimen Description URINE, RANDOM  Final   Special Requests   Final    NONE Performed at [facility], [address], [place], [place] [address]    Culture MULTIPLE SPECIES PRESENT, SUGGEST RECOLLECTION (A)  Final   Report Status 09/30/2020 FINAL  Final  Culture, blood (routine x 2)     Status: None (Preliminary result)   Collection Time: 09/28/20  8:40 PM   Specimen: BLOOD  Result Value Ref Range Status   Specimen Description BLOOD LEFT ARM  Final   Special Requests   Final    BOTTLES DRAWN AEROBIC AND ANAEROBIC Blood Culture adequate volume   Culture   Final    NO GROWTH 2 DAYS Performed at [facility], [place] [address] [place], [place] [address]    Report Status PENDING  Incomplete  Culture, blood (routine x 2)     Status: None (Preliminary result)   Collection Time: 09/28/20  8:45 PM   Specimen: BLOOD  Result Value Ref Range Status   Specimen Description BLOOD RIGHT ARM  Final   Special Requests   Final    BOTTLES DRAWN AEROBIC AND ANAEROBIC Blood Culture adequate volume   Culture   Final    NO GROWTH 2 DAYS Performed at [facility], [place] [address], [place], [place] [address]    Report Status PENDING  Incomplete  Aerobic/Anaerobic Culture (surgical/deep wound)     Status: None (Preliminary result)   Collection Time: 09/29/20  8:55 AM   Specimen: Wound  Result Value Ref Range Status   Specimen Description WOUND  Final   Special Requests ABDOMEN  Final   Gram Stain NO WBC SEEN NO ORGANISMS SEEN   Final   Culture   Final    CULTURE REINCUBATED FOR BETTER GROWTH Performed at [facility], [address], [place], [place] [address]    Report Status PENDING  Incomplete  Urine Culture     Status: Abnormal   Collection Time: 09/30/20  7:29 AM   Specimen: Urine, Clean Catch   Result Value Ref Range Status   Specimen Description URINE, CLEAN CATCH  Final   Special Requests NONE  Final   Culture (A)  Final    <10,000 COLONIES/mL INSIGNIFICANT GROWTH Performed at [facility], [place] [address], [place], [place] [address]    Report Status 10/01/2020 FINAL  Final   Assessment/Plan: S/P Procedure(s) (LRB): VIDEO BRONCHOSCOPY (N/A) Transhiatal total ESOPHAGECTOMY with Pyloroplasty (N/A) JEJUNOSTOMY (N/A) REMOVAL OF GASTROSTOMY TUBE  1 fever curve improving Tmax 101.2 2 VSS , some HTN- mostly good, sinus rhythm 3 sats good on RA 4 WBC trend slightly up with WBC 12.1, conts levaquin IV, CX unremarkable so far 5 normal renal fxn 6 CT scan results noted 7 poss change to night time tube feeds soon 8 hopefully home in next couple days  LOS: 11 days    John Giovanni PA-C  Pager [phone] 10/01/2020  Ct scan reviewed with radiology yesterday fever curve better No drainage from left neck penrose - advanced out little more today Decreased drainage from abdominal incision today- culture negative so far Advance diet slightly I have seen and examined Lupe Carney and agree with the above assessment  and plan.  Grace Isaac MD Beeper [phone] Office [phone] 10/01/2020 9:32 AM

## 2020-10-01 NOTE — Plan of Care (Signed)

## 2020-10-02 LAB — BASIC METABOLIC PANEL WITH GFR
Anion gap: 7 (ref 5–15)
BUN: 23 mg/dL — ABNORMAL HIGH (ref 6–20)
CO2: 27 mmol/L (ref 22–32)
Calcium: 8.2 mg/dL — ABNORMAL LOW (ref 8.9–10.3)
Chloride: 98 mmol/L (ref 98–111)
Creatinine, Ser: 0.86 mg/dL (ref 0.61–1.24)
GFR, Estimated: >60 mL/min
Glucose, Bld: 126 mg/dL — ABNORMAL HIGH (ref 70–99)
Potassium: 4.1 mmol/L (ref 3.5–5.1)
Sodium: 132 mmol/L — ABNORMAL LOW (ref 135–145)

## 2020-10-02 LAB — CBC
HCT: 38 % — ABNORMAL LOW (ref 39.0–52.0)
Hemoglobin: 12.8 g/dL — ABNORMAL LOW (ref 13.0–17.0)
MCH: 32.7 pg (ref 26.0–34.0)
MCHC: 33.7 g/dL (ref 30.0–36.0)
MCV: 96.9 fL (ref 80.0–100.0)
Platelets: 249 10*3/uL (ref 150–400)
RBC: 3.92 MIL/uL — ABNORMAL LOW (ref 4.22–5.81)
RDW: 13.7 % (ref 11.5–15.5)
WBC: 9.2 10*3/uL (ref 4.0–10.5)
nRBC: 0 % (ref 0.0–0.2)

## 2020-10-02 LAB — GLUCOSE, CAPILLARY
Glucose-Capillary: 138 mg/dL — ABNORMAL HIGH (ref 70–99)
Glucose-Capillary: 174 mg/dL — ABNORMAL HIGH (ref 70–99)
Glucose-Capillary: 104 mg/dL — ABNORMAL HIGH (ref 70–99)
Glucose-Capillary: 119 mg/dL — ABNORMAL HIGH (ref 70–99)
Glucose-Capillary: 121 mg/dL — ABNORMAL HIGH (ref 70–99)
Glucose-Capillary: 115 mg/dL — ABNORMAL HIGH (ref 70–99)

## 2020-10-02 NOTE — Progress Notes (Signed)
In tears saying he misses his family, spent few min with pt and listened to his concern.

## 2020-10-02 NOTE — Progress Notes (Signed)
12 Days Post-Op Procedure(s) (LRB): VIDEO BRONCHOSCOPY (N/A) Transhiatal total ESOPHAGECTOMY with Pyloroplasty (N/A) JEJUNOSTOMY (N/A) REMOVAL OF GASTROSTOMY TUBE Subjective: Up in chair. Tolerating POs Serous drainage from abdominal wound per RN  Objective: Vital signs in last 24 hours: Temp:  [98.6 F (37 C)-100.4 F (38 C)] 99.2 F (37.3 C) (11/13 0721) Pulse Rate:  [0-101] 0 (11/13 0928) Cardiac Rhythm: Normal sinus rhythm (11/13 0721) Resp:  [16-20] 16 (11/13 0324) BP: (122-130)/(75-94) 125/92 (11/13 0928) SpO2:  [93 %-98 %] 95 % (11/13 0324) Weight:  [84.1 kg] 84.1 kg (11/13 0422)  Hemodynamic parameters for last 24 hours:    Intake/Output from previous day: 11/12 0701 - 11/13 0700 In: 2373.6 [P.O.:477; NG/GT:1540.5; IV Piggyback:306.1] Out: -  Intake/Output this shift: Total I/O In: 65 [NG/GT:65] Out: -   General appearance: alert, cooperative and no distress Neurologic: intact Heart: regular rate and rhythm Lungs: clear to auscultation bilaterally Abdomen: normal findings: soft, non-tender  Lab Results: Recent Labs    10/01/20 0040 10/02/20 0105  WBC 12.1* 9.2  HGB 13.0 12.8*  HCT 38.5* 38.0*  PLT 252 249   BMET:  Recent Labs    10/01/20 0040 10/02/20 0105  NA 132* 132*  K 4.1 4.1  CL 98 98  CO2 26 27  GLUCOSE 131* 126*  BUN 25* 23*  CREATININE 0.87 0.86  CALCIUM 8.5* 8.2*    PT/INR: No results for input(s): LABPROT, INR in the last 72 hours. ABG    Component Value Date/Time   PHART 7.396 09/21/2020 0530   HCO3 25.3 09/21/2020 0530   TCO2 24 09/20/2020 1645   ACIDBASEDEF 2.0 09/20/2020 1645   O2SAT 99.4 09/21/2020 0530   CBG (last 3)  Recent Labs    10/01/20 2346 10/02/20 0321 10/02/20 0750  GLUCAP 110* 138* 174*    Assessment/Plan: S/P Procedure(s) (LRB): VIDEO BRONCHOSCOPY (N/A) Transhiatal total ESOPHAGECTOMY with Pyloroplasty (N/A) JEJUNOSTOMY (N/A) REMOVAL OF GASTROSTOMY TUBE -POD # 12 On Levaquin- afebrile and WBC  normal today Still has serous drainage from abdominal incision- change dressing as needed Tolerating POs Continue ambulation   LOS: 12 days    Melrose Nakayama 10/02/2020

## 2020-10-03 LAB — GLUCOSE, CAPILLARY
Glucose-Capillary: 114 mg/dL — ABNORMAL HIGH (ref 70–99)
Glucose-Capillary: 109 mg/dL — ABNORMAL HIGH (ref 70–99)
Glucose-Capillary: 115 mg/dL — ABNORMAL HIGH (ref 70–99)
Glucose-Capillary: 135 mg/dL — ABNORMAL HIGH (ref 70–99)
Glucose-Capillary: 113 mg/dL — ABNORMAL HIGH (ref 70–99)

## 2020-10-03 LAB — CULTURE, BLOOD (ROUTINE X 2)
Culture: NO GROWTH
Culture: NO GROWTH
Special Requests: ADEQUATE
Special Requests: ADEQUATE

## 2020-10-03 LAB — CBC
HCT: 39.3 % (ref 39.0–52.0)
Hemoglobin: 13 g/dL (ref 13.0–17.0)
MCH: 31.9 pg (ref 26.0–34.0)
MCHC: 33.1 g/dL (ref 30.0–36.0)
MCV: 96.3 fL (ref 80.0–100.0)
Platelets: 320 10*3/uL (ref 150–400)
RBC: 4.08 MIL/uL — ABNORMAL LOW (ref 4.22–5.81)
RDW: 13.5 % (ref 11.5–15.5)
WBC: 10.1 10*3/uL (ref 4.0–10.5)
nRBC: 0 % (ref 0.0–0.2)

## 2020-10-03 NOTE — Progress Notes (Signed)
13 Days Post-Op Procedure(s) (LRB): VIDEO BRONCHOSCOPY (N/A) Transhiatal total ESOPHAGECTOMY with Pyloroplasty (N/A) JEJUNOSTOMY (N/A) REMOVAL OF GASTROSTOMY TUBE Subjective: Feels better  Objective: Vital signs in last 24 hours: Temp:  [97.8 F (36.6 C)-99.1 F (37.3 C)] 97.8 F (36.6 C) (11/14 0725) Pulse Rate:  [79-91] 86 (11/14 0919) Cardiac Rhythm: Normal sinus rhythm (11/14 0725) Resp:  [16-23] 19 (11/14 0331) BP: (113-133)/(79-92) 133/92 (11/14 0919) SpO2:  [93 %-98 %] 96 % (11/14 0725)  Hemodynamic parameters for last 24 hours:    Intake/Output from previous day: 11/13 0701 - 11/14 0700 In: 1945 [P.O.:420; NG/GT:1365; IV Piggyback:100] Out: -  Intake/Output this shift: Total I/O In: 120 [P.O.:120] Out: -   General appearance: alert, cooperative and no distress Neurologic: intact Heart: regular rate and rhythm Lungs: clear to auscultation bilaterally Abdomen: soft, seros drainage from abdominal wound, no erythema Wound: neck clean, Penrose advanced  Lab Results: Recent Labs    10/02/20 0105 10/03/20 0038  WBC 9.2 10.1  HGB 12.8* 13.0  HCT 38.0* 39.3  PLT 249 320   BMET:  Recent Labs    10/01/20 0040 10/02/20 0105  NA 132* 132*  K 4.1 4.1  CL 98 98  CO2 26 27  GLUCOSE 131* 126*  BUN 25* 23*  CREATININE 0.87 0.86  CALCIUM 8.5* 8.2*    PT/INR: No results for input(s): LABPROT, INR in the last 72 hours. ABG    Component Value Date/Time   PHART 7.396 09/21/2020 0530   HCO3 25.3 09/21/2020 0530   TCO2 24 09/20/2020 1645   ACIDBASEDEF 2.0 09/20/2020 1645   O2SAT 99.4 09/21/2020 0530   CBG (last 3)  Recent Labs    10/02/20 1951 10/02/20 2324 10/03/20 0337  GLUCAP 121* 115* 114*    Assessment/Plan: S/P Procedure(s) (LRB): VIDEO BRONCHOSCOPY (N/A) Transhiatal total ESOPHAGECTOMY with Pyloroplasty (N/A) JEJUNOSTOMY (N/A) REMOVAL OF GASTROSTOMY TUBE -POD 13 Tolerating POs and tube feedings Penrose in neck advanced Still has serous  drainage from abdominal wound Day 5 Levaquin- afebrile overnight, WBC normal   LOS: 13 days    Thomas Gonzalez 10/03/2020

## 2020-10-04 LAB — GLUCOSE, CAPILLARY
Glucose-Capillary: 110 mg/dL — ABNORMAL HIGH (ref 70–99)
Glucose-Capillary: 122 mg/dL — ABNORMAL HIGH (ref 70–99)
Glucose-Capillary: 127 mg/dL — ABNORMAL HIGH (ref 70–99)
Glucose-Capillary: 126 mg/dL — ABNORMAL HIGH (ref 70–99)
Glucose-Capillary: 102 mg/dL — ABNORMAL HIGH (ref 70–99)
Glucose-Capillary: 119 mg/dL — ABNORMAL HIGH (ref 70–99)

## 2020-10-04 LAB — CBC
HCT: 37.3 % — ABNORMAL LOW (ref 39.0–52.0)
Hemoglobin: 12.4 g/dL — ABNORMAL LOW (ref 13.0–17.0)
MCH: 32.4 pg (ref 26.0–34.0)
MCHC: 33.2 g/dL (ref 30.0–36.0)
MCV: 97.4 fL (ref 80.0–100.0)
Platelets: 363 10*3/uL (ref 150–400)
RBC: 3.83 MIL/uL — ABNORMAL LOW (ref 4.22–5.81)
RDW: 13.6 % (ref 11.5–15.5)
WBC: 11.1 10*3/uL — ABNORMAL HIGH (ref 4.0–10.5)
nRBC: 0 % (ref 0.0–0.2)

## 2020-10-04 LAB — COMPREHENSIVE METABOLIC PANEL
ALT: 85 U/L — ABNORMAL HIGH (ref 0–44)
AST: 51 U/L — ABNORMAL HIGH (ref 15–41)
Albumin: 2.3 g/dL — ABNORMAL LOW (ref 3.5–5.0)
Alkaline Phosphatase: 193 U/L — ABNORMAL HIGH (ref 38–126)
Anion gap: 7 (ref 5–15)
BUN: 21 mg/dL — ABNORMAL HIGH (ref 6–20)
CO2: 24 mmol/L (ref 22–32)
Calcium: 8.3 mg/dL — ABNORMAL LOW (ref 8.9–10.3)
Chloride: 102 mmol/L (ref 98–111)
Creatinine, Ser: 0.72 mg/dL (ref 0.61–1.24)
GFR, Estimated: >60 mL/min (ref >60)
Glucose, Bld: 131 mg/dL — ABNORMAL HIGH (ref 70–99)
Potassium: 4.2 mmol/L (ref 3.5–5.1)
Sodium: 133 mmol/L — ABNORMAL LOW (ref 135–145)
Total Bilirubin: 0.5 mg/dL (ref 0.3–1.2)
Total Protein: 5.4 g/dL — ABNORMAL LOW (ref 6.5–8.1)

## 2020-10-04 LAB — AEROBIC/ANAEROBIC CULTURE W GRAM STAIN (SURGICAL/DEEP WOUND): Gram Stain: NONE SEEN

## 2020-10-04 NOTE — Progress Notes (Addendum)
[address]       [place] [address]             [phone]      14 Days Post-Op Procedure(s) (LRB): VIDEO BRONCHOSCOPY (N/A) Transhiatal total ESOPHAGECTOMY with Pyloroplasty (N/A) JEJUNOSTOMY (N/A) REMOVAL OF GASTROSTOMY TUBE Subjective: Told current diet  Objective: Vital signs in last 24 hours: Temp:  [97.8 F (36.6 C)-99.2 F (37.3 C)] 97.8 F (36.6 C) (11/15 0715) Pulse Rate:  [86-93] 93 (11/15 0308) Cardiac Rhythm: Normal sinus rhythm (11/15 0400) Resp:  [15-21] 20 (11/15 0715) BP: (115-133)/(75-92) 117/76 (11/15 0715) SpO2:  [96 %-98 %] 98 % (11/15 0308) Weight:  [83 kg] 83 kg (11/15 0308)  Hemodynamic parameters for last 24 hours:    Intake/Output from previous day: 11/14 0701 - 11/15 0700 In: 1695 [P.O.:270; NG/GT:1365] Out: -  Intake/Output this shift: No intake/output data recorded.  General appearance: alert, cooperative and no distress Heart: regular rate and rhythm Lungs: clear to auscultation bilaterally Abdomen: benign Extremities: no edema or calf tenderness Wound: incis healing but still some serous abdominal incis drainage  Lab Results: Recent Labs    10/03/20 0038 10/04/20 0217  WBC 10.1 11.1*  HGB 13.0 12.4*  HCT 39.3 37.3*  PLT 320 363   BMET:  Recent Labs    10/02/20 0105 10/04/20 0217  NA 132* 133*  K 4.1 4.2  CL 98 102  CO2 27 24  GLUCOSE 126* 131*  BUN 23* 21*  CREATININE 0.86 0.72  CALCIUM 8.2* 8.3*    PT/INR: No results for input(s): LABPROT, INR in the last 72 hours. ABG    Component Value Date/Time   PHART 7.396 09/21/2020 0530   HCO3 25.3 09/21/2020 0530   TCO2 24 09/20/2020 1645   ACIDBASEDEF 2.0 09/20/2020 1645   O2SAT 99.4 09/21/2020 0530   CBG (last 3)  Recent Labs    10/03/20 2011 10/03/20 2326 10/04/20 0305  GLUCAP 135* 113* 110*    Meds Scheduled Meds: . chlorhexidine  15 mL Mouth Rinse BID  . Chlorhexidine Gluconate Cloth  6 each Topical Daily  . enoxaparin  (LOVENOX) injection  40 mg Subcutaneous Daily  . feeding supplement (PROSource TF)  45 mL Per Tube TID  . insulin aspart  0-24 Units Subcutaneous Q4H  . lidocaine  1 patch Transdermal Q24H  . mouth rinse  15 mL Mouth Rinse q12n4p  . metoprolol tartrate  12.5 mg Per Tube BID  . pantoprazole sodium  40 mg Per Tube BID   Continuous Infusions: . dextrose 5 % and 0.9% NaCl Stopped (09/27/20 1900)  . feeding supplement (OSMOLITE 1.5 CAL) 65 mL/hr at 10/04/20 0500  . levofloxacin (LEVAQUIN) IV 750 mg (10/03/20 0924)   PRN Meds:.acetaminophen (TYLENOL) oral liquid 160 mg/5 mL, ondansetron (ZOFRAN) IV, oxyCODONE **AND** [DISCONTINUED] acetaminophen, phenol  Xrays No results found.  Assessment/Plan: S/P Procedure(s) (LRB): VIDEO BRONCHOSCOPY (N/A) Transhiatal total ESOPHAGECTOMY with Pyloroplasty (N/A) JEJUNOSTOMY (N/A) REMOVAL OF GASTROSTOMY TUBE  1 tmax 99.2 2 VSS 3 sats good on RA 4 slight leukocytosis 5 H/H pretty stable 6 normal renal fxn 7 slight elev of LFT's 8 CBG's ok 9 switch to night time tube feeds soon if intake ok 10 routine pulm toilet and rehab    LOS: 14 days    John Giovanni PA-C Pager [phone] 10/04/2020  Taking po diet  With solid food  Ok, penrose from neck completely out without drainage Still serous drainage upper abdominal wound -  decreasing  montir in hospital 1-2 more days  Convert to night tube feeding as goes home I have seen and examined Thomas Gonzalez and agree with the above assessment  and plan.  Grace Isaac MD Beeper [phone] Office [phone] 10/04/2020 7:49 AM

## 2020-10-04 NOTE — Plan of Care (Signed)

## 2020-10-05 LAB — GLUCOSE, CAPILLARY
Glucose-Capillary: 112 mg/dL — ABNORMAL HIGH (ref 70–99)
Glucose-Capillary: 119 mg/dL — ABNORMAL HIGH (ref 70–99)
Glucose-Capillary: 126 mg/dL — ABNORMAL HIGH (ref 70–99)
Glucose-Capillary: 132 mg/dL — ABNORMAL HIGH (ref 70–99)
Glucose-Capillary: 145 mg/dL — ABNORMAL HIGH (ref 70–99)
Glucose-Capillary: 84 mg/dL (ref 70–99)

## 2020-10-05 MED ORDER — LIDOCAINE HCL (PF) 1 % IJ SOLN
INTRAMUSCULAR | Status: AC
  Administered 2020-10-05: 5 mL
  Filled 2020-10-05: qty 5

## 2020-10-05 MED ORDER — ENSURE ENLIVE PO LIQD
237.0000 mL | Freq: Two times a day (BID) | ORAL | Status: DC
  Administered 2020-10-05 – 2020-10-06 (×2): 237 mL via ORAL

## 2020-10-05 MED ORDER — OSMOLITE 1.5 CAL PO LIQD
1520.0000 mL | ORAL | Status: DC
  Administered 2020-10-05: 1520 mL
  Administered 2020-10-05 – 2020-10-06 (×2): 1000 mL

## 2020-10-05 NOTE — Progress Notes (Signed)
Tube feeding  Stopped, dose changed to start at 6 pm this evening at 95 ml /hr every 24 hours.

## 2020-10-05 NOTE — Progress Notes (Signed)
Nutrition Follow-up  RD working remotely.  DOCUMENTATION CODES:   Not applicable  INTERVENTION:   - Recommend initiating bowel regimen as last documented BM was on 09/29/20  Transition to nocturnal tube feeds via J-tube: - Osmolite 1.5 @ 95 ml/hr to run over 16 hours from 1800 to 1000 (total of 1520 ml) - Continue ProSource 45 ml TID  Nocturnal tube feeding regimen provides 2400 kcal, 128 grams of protein, and 1158 ml of H2O (meets 98% of estimated kcal needs and 100% of estimated protein needs).  - Ensure Enlive po BID, each supplement provides 350 kcal and 20 grams of protein  NUTRITION DIAGNOSIS:   Increased nutrient needs related to post-op healing as evidenced by estimated needs.  Ongoing  GOAL:   Patient will meet greater than or equal to 90% of their needs  Met via TF  MONITOR:   PO intake, Supplement acceptance, Labs, Weight trends, TF tolerance, Skin, I & O's  REASON FOR ASSESSMENT:   Consult Other (transition to nocturnal tube feeds)  ASSESSMENT:   Patient with PMH significant for bowel obstrcution, GERD, adenocarcinoma of the distal esophagus, and s/p PEG. Presents this admission for surgical management of distal esophagus adenocarcinoma.  11/01 - total esophagectomy with pyloroplasty, removal of G-tube, placement of J-tube 11/03 - TF started 11/08 - gastrografin swallow study with no obvious leak, diet advanced to clear liquids 11/09 - diet advanced to full liquids 11/12 - diet advanced to dysphagia 3  Received consult to transition pt to nocturnal tube feeds via J-tube in preparation for discharge home.  Attempted to reach pt via phone call to room x 3. However, line was busy on all attempts.  Per notes, pt tolerating dysphagia 3 diet without issue. PO intake has been improving over the last few days with meal completions now averaging ~70%.  RD will order oral nutrition supplement to aid pt in meeting kcal and protein needs via POs.  Admit  weight: 88.5 kg Current weight: 83.3 kg  Current TF: Osmolite 1.5 @ 65 ml/hr, ProSource TF 45 ml TID  Meal Completion: 20-80%  Medications reviewed and include: SSI q 4 hours, protonix, IV abx  Labs reviewed: sodium 133, BUN 21, elevated LFTs CBG's: 102-132 x 24 hours  No documented UOP over the last 24 hours. I/O's: +22.2 L since admit  Diet Order:   Diet Order            DIET DYS 3 Room service appropriate? Yes; Fluid consistency: Thin  Diet effective now                 EDUCATION NEEDS:   Education needs have been addressed  Skin:  Skin Assessment: Skin Integrity Issues: Incisions: L neck, R abdomen  Last BM:  09/29/20  Height:   Ht Readings from Last 1 Encounters:  09/21/20 _0  (1.753 m)    Weight:   Wt Readings from Last 1 Encounters:  10/05/20 83.3 kg    BMI:  Body mass index is 27.11 kg/m.  Estimated Nutritional Needs:   Kcal:  2450-2750 kcal  Protein:  125-145 grams  Fluid:  >/= 2 L/day    Gaynell Face, MS, RD, LDN Inpatient Clinical Dietitian Please see AMiON for contact information.

## 2020-10-05 NOTE — Progress Notes (Signed)
Osmolite 1.5 ml started at 95 ml/hr. to run for 16 hours tolerated well. Continue to monitor.

## 2020-10-05 NOTE — Progress Notes (Addendum)
[address]       [place] [address]             [phone]      15 Days Post-Op Procedure(s) (LRB): VIDEO BRONCHOSCOPY (N/A) Transhiatal total ESOPHAGECTOMY with Pyloroplasty (N/A) JEJUNOSTOMY (N/A) REMOVAL OF GASTROSTOMY TUBE Subjective:  conts to feel stronger/better  Objective: Vital signs in last 24 hours: Temp:  [98.3 F (36.8 C)-98.5 F (36.9 C)] 98.5 F (36.9 C) (11/16 0421) Pulse Rate:  [88-96] 88 (11/16 0421) Cardiac Rhythm: Normal sinus rhythm (11/16 0355) Resp:  [16-19] 16 (11/16 0421) BP: (121-131)/(73-85) 131/77 (11/16 0421) SpO2:  [96 %-98 %] 98 % (11/16 0421) Weight:  [83.3 kg] 83.3 kg (11/16 0421)  Hemodynamic parameters for last 24 hours:    Intake/Output from previous day: 11/15 0701 - 11/16 0700 In: 500 [P.O.:180; NG/GT:130] Out: 0  Intake/Output this shift: No intake/output data recorded.  General appearance: alert, cooperative and no distress Heart: regular rate and rhythm Lungs: mildly dim in bases Abdomen: benign Extremities: no edema or calf tenderness Wound: conts with some abdominal incis drainage, no cellulitis, looks mostly serous  Lab Results: Recent Labs    10/03/20 0038 10/04/20 0217  WBC 10.1 11.1*  HGB 13.0 12.4*  HCT 39.3 37.3*  PLT 320 363   BMET:  Recent Labs    10/04/20 0217  NA 133*  K 4.2  CL 102  CO2 24  GLUCOSE 131*  BUN 21*  CREATININE 0.72  CALCIUM 8.3*    PT/INR: No results for input(s): LABPROT, INR in the last 72 hours. ABG    Component Value Date/Time   PHART 7.396 09/21/2020 0530   HCO3 25.3 09/21/2020 0530   TCO2 24 09/20/2020 1645   ACIDBASEDEF 2.0 09/20/2020 1645   O2SAT 99.4 09/21/2020 0530   CBG (last 3)  Recent Labs    10/04/20 1933 10/04/20 2321 10/05/20 0419  GLUCAP 102* 119* 132*    Meds Scheduled Meds: . enoxaparin (LOVENOX) injection  40 mg Subcutaneous Daily  . feeding supplement (PROSource TF)  45 mL Per Tube TID  . insulin aspart  0-24  Units Subcutaneous Q4H  . lidocaine  1 patch Transdermal Q24H  . metoprolol tartrate  12.5 mg Per Tube BID  . pantoprazole sodium  40 mg Per Tube BID   Continuous Infusions: . dextrose 5 % and 0.9% NaCl Stopped (09/27/20 1900)  . feeding supplement (OSMOLITE 1.5 CAL) 1,000 mL (10/04/20 1642)  . levofloxacin (LEVAQUIN) IV 750 mg (10/04/20 1058)   PRN Meds:.acetaminophen (TYLENOL) oral liquid 160 mg/5 mL, ondansetron (ZOFRAN) IV, oxyCODONE **AND** [DISCONTINUED] acetaminophen, phenol  Xrays No results found.  Assessment/Plan: S/P Procedure(s) (LRB): VIDEO BRONCHOSCOPY (N/A) Transhiatal total ESOPHAGECTOMY with Pyloroplasty (N/A) JEJUNOSTOMY (N/A) REMOVAL OF GASTROSTOMY TUBE  1 afeb, VSS, NSR 2 sats good on RA 3 no new labs 4 BS adeq controlled 5 convert to night feedings 6 repeat labs in am, then poss discharge  LOS: 15 days    John Giovanni PA-C Pager [phone] 10/05/2020  At beside - 2 cm of abdominal incision opened and packed , previous cultures dod do grow anything, this is just adjacent to the previous peg tube placement . Will start wet to dry dressing changes to this area. Patient taking po diet with some solid food. Transition to night time tube feeding  And po diet in day Poss home tomorrow with home nurse for dressing changes and instruct in tube feeding  I have seen  and examined Lupe Carney and agree with the above assessment  and plan.  Grace Isaac MD Beeper [phone] Office [phone] 10/05/2020 3:51 PM

## 2020-10-05 NOTE — Plan of Care (Signed)

## 2020-10-06 ENCOUNTER — Other Ambulatory Visit: Payer: Self-pay | Admitting: Cardiothoracic Surgery

## 2020-10-06 DIAGNOSIS — C159 Malignant neoplasm of esophagus, unspecified: Secondary | ICD-10-CM

## 2020-10-06 LAB — BASIC METABOLIC PANEL
Anion gap: 6 (ref 5–15)
BUN: 22 mg/dL — ABNORMAL HIGH (ref 6–20)
CO2: 27 mmol/L (ref 22–32)
Calcium: 8.6 mg/dL — ABNORMAL LOW (ref 8.9–10.3)
Chloride: 102 mmol/L (ref 98–111)
Creatinine, Ser: 0.78 mg/dL (ref 0.61–1.24)
GFR, Estimated: >60 mL/min (ref >60)
Glucose, Bld: 126 mg/dL — ABNORMAL HIGH (ref 70–99)
Potassium: 4.4 mmol/L (ref 3.5–5.1)
Sodium: 135 mmol/L (ref 135–145)

## 2020-10-06 LAB — CBC WITH DIFFERENTIAL/PLATELET
Abs Immature Granulocytes: 0.59 10*3/uL — ABNORMAL HIGH (ref 0.00–0.07)
Basophils Absolute: 0.1 10*3/uL (ref 0.0–0.1)
Basophils Relative: 1 %
Eosinophils Absolute: 1.3 10*3/uL — ABNORMAL HIGH (ref 0.0–0.5)
Eosinophils Relative: 9 %
HCT: 37.3 % — ABNORMAL LOW (ref 39.0–52.0)
Hemoglobin: 12.4 g/dL — ABNORMAL LOW (ref 13.0–17.0)
Immature Granulocytes: 4 %
Lymphocytes Relative: 6 %
Lymphs Abs: 0.8 10*3/uL (ref 0.7–4.0)
MCH: 32.7 pg (ref 26.0–34.0)
MCHC: 33.2 g/dL (ref 30.0–36.0)
MCV: 98.4 fL (ref 80.0–100.0)
Monocytes Absolute: 1.7 10*3/uL — ABNORMAL HIGH (ref 0.1–1.0)
Monocytes Relative: 12 %
Neutro Abs: 9.6 10*3/uL — ABNORMAL HIGH (ref 1.7–7.7)
Neutrophils Relative %: 68 %
Platelets: 449 10*3/uL — ABNORMAL HIGH (ref 150–400)
RBC: 3.79 MIL/uL — ABNORMAL LOW (ref 4.22–5.81)
RDW: 13.3 % (ref 11.5–15.5)
WBC: 14.2 10*3/uL — ABNORMAL HIGH (ref 4.0–10.5)
nRBC: 0 % (ref 0.0–0.2)

## 2020-10-06 LAB — GLUCOSE, CAPILLARY
Glucose-Capillary: 149 mg/dL — ABNORMAL HIGH (ref 70–99)
Glucose-Capillary: 114 mg/dL — ABNORMAL HIGH (ref 70–99)
Glucose-Capillary: 116 mg/dL — ABNORMAL HIGH (ref 70–99)
Glucose-Capillary: 108 mg/dL — ABNORMAL HIGH (ref 70–99)

## 2020-10-06 MED ORDER — ACETAMINOPHEN 325 MG PO TABS: 650.0000 mg | ORAL_TABLET | Freq: Four times a day (QID) | ORAL | Status: AC | PRN

## 2020-10-06 MED ORDER — METOPROLOL TARTRATE 25 MG PO TABS: 12.5000 mg | ORAL_TABLET | Freq: Two times a day (BID) | ORAL | 1 refills | Status: DC

## 2020-10-06 MED ORDER — OSMOLITE 1.5 CAL PO LIQD
1520.0000 mL | ORAL | 1 refills | Status: DC
Start: 2020-10-06 — End: 2020-10-21

## 2020-10-06 MED ORDER — PROSOURCE TF PO LIQD: 45.0000 mL | Freq: Three times a day (TID) | ORAL | 1 refills | Status: DC

## 2020-10-06 MED ORDER — ENSURE ENLIVE PO LIQD: 237.0000 mL | Freq: Two times a day (BID) | ORAL | 0 refills | Status: AC

## 2020-10-06 MED ORDER — OXYCODONE HCL 5 MG PO TABS
5.0000 mg | ORAL_TABLET | Freq: Four times a day (QID) | ORAL | 0 refills | Status: AC | PRN
Start: 2020-10-06 — End: 2020-10-13

## 2020-10-06 MED ORDER — LEVOFLOXACIN 750 MG PO TABS: 750.0000 mg | ORAL_TABLET | Freq: Every day | ORAL | 0 refills | Status: DC

## 2020-10-06 NOTE — Progress Notes (Signed)
Pt has been waiting on equipment all day (since 0830) pt walked out to desk and stated he was leaving that the equipment could be delivered to his home he was tired of waiting and had a cat at home his wife needed to get to. This RN called [employer] Thedore Mins) who stated his driver was supposed to deliver here first but went elsewhere and they did not know how much longer it would be before tube feeding equipment would be delivered. I told Thedore Mins that the patient would be going home and he said he would re-route the deliver to the patients home. RN tried to encourage patient to wait for equipment which he stated he has been waiting all day.   This RN did complete teaching on hospital pump for TFs and demonstrated dressing changes with the patient and his wife and provided the charge nurse direct phone number in case they had any questions tonight about how to work equipment. A bag full of extra supplies were also sent with the patient.

## 2020-10-06 NOTE — Progress Notes (Addendum)
[address]       [place] [address]             [phone]      16 Days Post-Op Procedure(s) (LRB): VIDEO BRONCHOSCOPY (N/A) Transhiatal total ESOPHAGECTOMY with Pyloroplasty (N/A) JEJUNOSTOMY (N/A) REMOVAL OF GASTROSTOMY TUBE Subjective: Feels well  Objective: Vital signs in last 24 hours: Temp:  [98 F (36.7 C)-98.6 F (37 C)] 98.6 F (37 C) (11/17 0343) Pulse Rate:  [86-99] 90 (11/17 0343) Cardiac Rhythm: Normal sinus rhythm (11/17 0400) Resp:  [16-20] 19 (11/17 0343) BP: (103-128)/(74-82) 103/77 (11/17 0343) SpO2:  [94 %-98 %] 94 % (11/17 0343) Weight:  [82.6 kg] 82.6 kg (11/17 0343)  Hemodynamic parameters for last 24 hours:    Intake/Output from previous day: 11/16 0701 - 11/17 0700 In: 972 [P.O.:597; NG/GT:255] Out: -  Intake/Output this shift: No intake/output data recorded.  General appearance: alert, cooperative and no distress Heart: regular rate and rhythm Lungs: clear to auscultation bilaterally Abdomen: benign Extremities: no edema or calf tenderness Wound: wound with mod purulent drainage- dressing changed/repacked wound, slight purulence fron J - tube site as well  Lab Results: Recent Labs    10/04/20 0217 10/06/20 0110  WBC 11.1* 14.2*  HGB 12.4* 12.4*  HCT 37.3* 37.3*  PLT 363 449*   BMET:  Recent Labs    10/04/20 0217 10/06/20 0110  NA 133* 135  K 4.2 4.4  CL 102 102  CO2 24 27  GLUCOSE 131* 126*  BUN 21* 22*  CREATININE 0.72 0.78  CALCIUM 8.3* 8.6*    PT/INR: No results for input(s): LABPROT, INR in the last 72 hours. ABG    Component Value Date/Time   PHART 7.396 09/21/2020 0530   HCO3 25.3 09/21/2020 0530   TCO2 24 09/20/2020 1645   ACIDBASEDEF 2.0 09/20/2020 1645   O2SAT 99.4 09/21/2020 0530   CBG (last 3)  Recent Labs    10/05/20 1948 10/05/20 2344 10/06/20 0342  GLUCAP 119* 112* 149*    Meds Scheduled Meds: . enoxaparin (LOVENOX) injection  40 mg Subcutaneous Daily  . feeding  supplement  237 mL Oral BID BM  . feeding supplement (OSMOLITE 1.5 CAL)  1,520 mL Per Tube Q24H  . feeding supplement (PROSource TF)  45 mL Per Tube TID  . insulin aspart  0-24 Units Subcutaneous Q4H  . lidocaine  1 patch Transdermal Q24H  . metoprolol tartrate  12.5 mg Per Tube BID  . pantoprazole sodium  40 mg Per Tube BID   Continuous Infusions: . dextrose 5 % and 0.9% NaCl Stopped (09/27/20 1900)  . levofloxacin (LEVAQUIN) IV 750 mg (10/05/20 0918)   PRN Meds:.acetaminophen (TYLENOL) oral liquid 160 mg/5 mL, ondansetron (ZOFRAN) IV, oxyCODONE **AND** [DISCONTINUED] acetaminophen, phenol  Results for orders placed or performed during the hospital encounter of 09/20/20  Culture, Urine     Status: Abnormal   Collection Time: 09/28/20  8:15 PM   Specimen: Urine, Random  Result Value Ref Range Status   Specimen Description URINE, RANDOM  Final   Special Requests   Final    NONE Performed at [facility], [place] [address] [place], [place] [address]    Culture MULTIPLE SPECIES PRESENT, SUGGEST RECOLLECTION (A)  Final   Report Status 09/30/2020 FINAL  Final  Culture, blood (routine x 2)     Status: None   Collection Time: 09/28/20  8:40 PM   Specimen: BLOOD  Result Value Ref Range Status  Specimen Description BLOOD LEFT ARM  Final   Special Requests   Final    BOTTLES DRAWN AEROBIC AND ANAEROBIC Blood Culture adequate volume   Culture   Final    NO GROWTH 5 DAYS Performed at [facility], [address], [place], [place] [address]    Report Status 10/03/2020 FINAL  Final  Culture, blood (routine x 2)     Status: None   Collection Time: 09/28/20  8:45 PM   Specimen: BLOOD  Result Value Ref Range Status   Specimen Description BLOOD RIGHT ARM  Final   Special Requests   Final    BOTTLES DRAWN AEROBIC AND ANAEROBIC Blood Culture adequate volume   Culture   Final    NO GROWTH 5 DAYS Performed at [facility], [place] [address], [place], [place] [address]     Report Status 10/03/2020 FINAL  Final  Aerobic/Anaerobic Culture (surgical/deep wound)     Status: None   Collection Time: 09/29/20  8:55 AM   Specimen: Wound  Result Value Ref Range Status   Specimen Description WOUND  Final   Special Requests ABDOMEN  Final   Gram Stain NO WBC SEEN NO ORGANISMS SEEN   Final   Culture   Final    RARE STAPHYLOCOCCUS LUGDUNENSIS RARE ENTEROBACTER CLOACAE RARE ACTINOMYCES ODONTOLYTICUS NO ANAEROBES ISOLATED Performed at [facility], [place] [address] [place], [place] [address]    Report Status 10/04/2020 FINAL  Final   Organism ID, Bacteria STAPHYLOCOCCUS LUGDUNENSIS  Final   Organism ID, Bacteria ENTEROBACTER CLOACAE  Final      Susceptibility   Enterobacter cloacae - MIC*    CEFAZOLIN >=64 RESISTANT Resistant     CEFEPIME <=0.12 SENSITIVE Sensitive     CEFTAZIDIME <=1 SENSITIVE Sensitive     CIPROFLOXACIN <=0.25 SENSITIVE Sensitive     GENTAMICIN <=1 SENSITIVE Sensitive     IMIPENEM 0.5 SENSITIVE Sensitive     TRIMETH/SULFA <=20 SENSITIVE Sensitive     PIP/TAZO <=4 SENSITIVE Sensitive     * RARE ENTEROBACTER CLOACAE   Staphylococcus lugdunensis - MIC*    CIPROFLOXACIN <=0.5 SENSITIVE Sensitive     ERYTHROMYCIN >=8 RESISTANT Resistant     GENTAMICIN <=0.5 SENSITIVE Sensitive     OXACILLIN 0.5 SENSITIVE Sensitive     TETRACYCLINE <=1 SENSITIVE Sensitive     VANCOMYCIN <=0.5 SENSITIVE Sensitive     TRIMETH/SULFA <=10 SENSITIVE Sensitive     CLINDAMYCIN >=8 RESISTANT Resistant     RIFAMPIN <=0.5 SENSITIVE Sensitive     Inducible Clindamycin NEGATIVE Sensitive     * RARE STAPHYLOCOCCUS LUGDUNENSIS  Urine Culture     Status: Abnormal   Collection Time: 09/30/20  7:29 AM   Specimen: Urine, Clean Catch  Result Value Ref Range Status   Specimen Description URINE, CLEAN CATCH  Final   Special Requests NONE  Final   Culture (A)  Final    <10,000 COLONIES/mL INSIGNIFICANT GROWTH Performed at [facility], [address],  [place], [place] [address]    Report Status 10/01/2020 FINAL  Final     Xrays No results found.  Assessment/Plan: S/P Procedure(s) (LRB): VIDEO BRONCHOSCOPY (N/A) Transhiatal total ESOPHAGECTOMY with Pyloroplasty (N/A) JEJUNOSTOMY (N/A) REMOVAL OF GASTROSTOMY TUBE   1 afeb, VSS, sinus rhythm 2 sats good on RA 3 WBC trending up- 14.2 today, High neutroph 9.6 4 sugars adeq controlled 5 conts dressing changes and levaquin 6 has been switched to night TF's  LOS: 16 days  John Giovanni PA-C Pager [phone] 10/06/2020  Patient wants to go home, feels good and taking po diet, plan tube feeding at night  Home today if home nursing arrangements are completed I have seen and examined Thomas Gonzalez and agree with the above assessment  and plan.  Grace Isaac MD Beeper [phone] Office [phone] 10/06/2020 1:12 PM

## 2020-10-06 NOTE — TOC Transition Note (Addendum)
Transition of Care [facility]) - CM/SW Discharge Note   Patient Details  Name: Thomas Gonzalez MRN: [id number] Date of Birth: July 09, 1966  [facility] CM/SW Contact:  Zenon Mayo, RN Phone Number: 10/06/2020, 2:25 PM   Clinical Narrative:    Patient is for dc today, NCM notified Tommi Rumps with [facility] and Thedore Mins with [employer], Thedore Mins will have the tube feeding and pump delivered to patient's room soe the Staff RN can go over with the wife on education of the feedings.  [facility] soc will be on Thursday or Friday.  NCM also notified Cory with [facility] of dressing changes, wet to dry. Staff RN states she will be teaching wife to do this as well.  Final next level of care: [place] Barriers to Discharge: Equipment Delay   Patient Goals and CMS Choice Patient states their goals for this hospitalization and ongoing recovery are:: get better CMS Medicare.gov Compare Post Acute Care list provided to:: Patient Choice offered to / list presented to : Patient  Discharge Placement                       Discharge Plan and Services                DME Arranged: Tube feeding, Tube feeding pump DME Agency: [employer], [employer] Date DME Agency Contacted: 10/06/20 Time DME Agency Contacted: [phone] Representative spoke with at DME Agency: [place]: RN [place] Agency: [employer] Date [place]: 09/29/20 Time [place]: 1000 Representative spoke with at [place]: [place] ([place]) Interventions     Readmission Risk Interventions No flowsheet data found.

## 2020-10-07 ENCOUNTER — Ambulatory Visit: Payer: BC Managed Care – PPO | Admitting: Cardiothoracic Surgery

## 2020-10-08 ENCOUNTER — Telehealth: Payer: Self-pay | Admitting: *Deleted

## 2020-10-08 ENCOUNTER — Other Ambulatory Visit: Payer: Self-pay | Admitting: *Deleted

## 2020-10-08 DIAGNOSIS — N200 Calculus of kidney: Secondary | ICD-10-CM | POA: Diagnosis not present

## 2020-10-08 DIAGNOSIS — C160 Malignant neoplasm of cardia: Secondary | ICD-10-CM | POA: Diagnosis not present

## 2020-10-08 DIAGNOSIS — M5430 Sciatica, unspecified side: Secondary | ICD-10-CM | POA: Diagnosis not present

## 2020-10-08 DIAGNOSIS — Z483 Aftercare following surgery for neoplasm: Secondary | ICD-10-CM | POA: Diagnosis not present

## 2020-10-08 DIAGNOSIS — K921 Melena: Secondary | ICD-10-CM | POA: Diagnosis not present

## 2020-10-08 DIAGNOSIS — Z87891 Personal history of nicotine dependence: Secondary | ICD-10-CM | POA: Diagnosis not present

## 2020-10-08 DIAGNOSIS — Z792 Long term (current) use of antibiotics: Secondary | ICD-10-CM | POA: Diagnosis not present

## 2020-10-08 DIAGNOSIS — K219 Gastro-esophageal reflux disease without esophagitis: Secondary | ICD-10-CM | POA: Diagnosis not present

## 2020-10-08 DIAGNOSIS — Z434 Encounter for attention to other artificial openings of digestive tract: Secondary | ICD-10-CM | POA: Diagnosis not present

## 2020-10-08 DIAGNOSIS — C159 Malignant neoplasm of esophagus, unspecified: Secondary | ICD-10-CM | POA: Diagnosis not present

## 2020-10-08 DIAGNOSIS — I700 Atherosclerosis of aorta: Secondary | ICD-10-CM | POA: Diagnosis not present

## 2020-10-08 MED ORDER — PROSOURCE TF PO LIQD: 45.0000 mL | Freq: Three times a day (TID) | ORAL | 1 refills | Status: DC

## 2020-10-08 NOTE — Telephone Encounter (Signed)
Pt's wife, Katharine Look, contacted the office regarding pt's prescription for Prosource tube feeding supplementation. Pharmacy never received prescription. Re-entered and sent to pt's preferred pharmacy.

## 2020-10-12 ENCOUNTER — Encounter: Payer: Self-pay | Admitting: Cardiothoracic Surgery

## 2020-10-12 ENCOUNTER — Other Ambulatory Visit: Payer: Self-pay

## 2020-10-12 ENCOUNTER — Ambulatory Visit (INDEPENDENT_AMBULATORY_CARE_PROVIDER_SITE_OTHER): Payer: Self-pay | Admitting: Cardiothoracic Surgery

## 2020-10-12 ENCOUNTER — Ambulatory Visit
Admission: RE | Admit: 2020-10-12 | Discharge: 2020-10-12 | Disposition: A | Discharge status: Home / Self Care | Payer: BC Managed Care – PPO | Source: Ambulatory Visit | Attending: Cardiothoracic Surgery | Admitting: Cardiothoracic Surgery

## 2020-10-12 VITALS — BP 136/89 | HR 107 | Resp 18 | Ht 69.0 in | Wt 185.2 lb

## 2020-10-12 DIAGNOSIS — Z792 Long term (current) use of antibiotics: Secondary | ICD-10-CM | POA: Diagnosis not present

## 2020-10-12 DIAGNOSIS — N200 Calculus of kidney: Secondary | ICD-10-CM | POA: Diagnosis not present

## 2020-10-12 DIAGNOSIS — C159 Malignant neoplasm of esophagus, unspecified: Secondary | ICD-10-CM

## 2020-10-12 DIAGNOSIS — J9811 Atelectasis: Secondary | ICD-10-CM | POA: Diagnosis not present

## 2020-10-12 DIAGNOSIS — Z483 Aftercare following surgery for neoplasm: Secondary | ICD-10-CM | POA: Diagnosis not present

## 2020-10-12 DIAGNOSIS — I700 Atherosclerosis of aorta: Secondary | ICD-10-CM | POA: Diagnosis not present

## 2020-10-12 DIAGNOSIS — Z434 Encounter for attention to other artificial openings of digestive tract: Secondary | ICD-10-CM | POA: Diagnosis not present

## 2020-10-12 DIAGNOSIS — K921 Melena: Secondary | ICD-10-CM | POA: Diagnosis not present

## 2020-10-12 DIAGNOSIS — K219 Gastro-esophageal reflux disease without esophagitis: Secondary | ICD-10-CM | POA: Diagnosis not present

## 2020-10-12 DIAGNOSIS — C160 Malignant neoplasm of cardia: Secondary | ICD-10-CM | POA: Diagnosis not present

## 2020-10-12 DIAGNOSIS — J90 Pleural effusion, not elsewhere classified: Secondary | ICD-10-CM | POA: Diagnosis not present

## 2020-10-12 DIAGNOSIS — Z87891 Personal history of nicotine dependence: Secondary | ICD-10-CM | POA: Diagnosis not present

## 2020-10-12 DIAGNOSIS — M5430 Sciatica, unspecified side: Secondary | ICD-10-CM | POA: Diagnosis not present

## 2020-10-12 NOTE — Progress Notes (Signed)
[address]       [place] [address]             [phone]      Thomas Gonzalez [place] Medical Record #[id number] Date of Birth: [birth year]  Referring: [name], MD Primary Care: Lillard Anes, MD Primary Cardiologist: No primary care provider on file.  Chief Complaint:   POST OP FOLLOW UP OPERATIVE REPORT DATE OF PROCEDURE:  09/20/2020 PREOPERATIVE DIAGNOSIS:  Adenocarcinoma of the distal esophagus, gastroesophageal junction. POSTOPERATIVE DIAGNOSIS:  Adenocarcinoma of the distal esophagus, gastroesophageal junction. SURGICAL PROCEDURE:  Video bronchoscopy, transhiatal total esophagectomy with cervical esophagogastrotomy, pyloroplasty, placement of feeding jejunostomy tube, removal of PEG tube.  Cancer Staging Esophageal carcinoma [facility]) Staging form: Esophagus - Adenocarcinoma, AJCC 8th Edition - Clinical stage from 09/02/2020: Stage Unknown (cTX, cN2, cM0, G3) - Signed by Grace Isaac, MD on 09/02/2020 - Pathologic stage from 09/23/2020: Stage I (ypT2, pN0, cM0, G2) - Signed by Grace Isaac, MD on 09/23/2020  History of Present Illness:     Patient returns to the office for his first postop visit after recent discharge following transhiatal total esophagectomy on November 1.  He notes that his been taking a p.o. diet without difficulty, has had no trouble swallowing, denies any dumping symptoms.  Was initially discharged home on nighttime tube feeding only.  Developed a breakdown of his abdominal incision in the area where the feeding tube had been preoperatively.  He has been doing wet-to-dry saline dressings to this area.  He has had no fever chills or evidence of infection.   Past Medical History:  Diagnosis Date  . Bowel obstruction ([place])    06/21/2017 reported- "years ago"  . Cancer ([place])    basal cell face  . GERD (gastroesophageal reflux disease)   . History of kidney stones    x 2     Social History    Tobacco Use  Smoking Status Former Smoker  . Years: 0.20  Smokeless Tobacco Former Systems developer  Tobacco Comment   quit in [place] History   Substance and Sexual Activity  Alcohol Use No   Comment: in high school- not since     Allergies  Allergen Reactions  . Penicillins Anaphylaxis  . Prednisone Anxiety    Mood swings    Current Outpatient Medications  Medication Sig Dispense Refill  . acetaminophen (TYLENOL) 325 MG tablet Take 2 tablets (650 mg total) by mouth every 6 (six) hours as needed.    Marland Kitchen ascorbic acid (VITAMIN C) 500 MG tablet Take 500 mg by mouth daily.    . cholecalciferol (VITAMIN D3) 25 MCG (1000 UNIT) tablet Take 1,000 Units by mouth daily.    . feeding supplement (ENSURE ENLIVE / ENSURE PLUS) LIQD Take 237 mLs by mouth 2 (two) times daily between meals. 14220 mL 0  . levofloxacin (LEVAQUIN) 750 MG tablet Take 1 tablet (750 mg total) by mouth daily. 7 tablet 0  . lidocaine (LIDODERM) 5 % USE 1 PATCH EXTERNALLY ONCE DAILY AS NEEDED FOR PAIN. APPLY TOPICALLY TO INTACT SKIN OVER AREA OF GREATEST PAIN. YOU MAY CUT TO SIZE MAXIMUM    . metoprolol tartrate (LOPRESSOR) 25 MG tablet Take 0.5 tablets (12.5 mg total) by mouth 2 (two) times daily. 30 tablet 1  . Nutritional Supplements (FEEDING SUPPLEMENT, OSMOLITE 1.5 CAL,) LIQD Place 1,520 mLs into feeding tube daily. 45600 mL 1  . Nutritional Supplements (FEEDING SUPPLEMENT, PROSOURCE TF,)  liquid Place 45 mLs into feeding tube 3 (three) times daily. 4050 mL 1  . omeprazole (PRILOSEC) 40 MG capsule Take 40 mg by mouth every morning.    . zinc gluconate 50 MG tablet Take 50 mg by mouth daily.     No current facility-administered medications for this visit.       Physical Exam: BP 136/89 (BP Location: Left Arm, Patient Position: Sitting)   Pulse (!) 107   Resp 18   Ht 5\' 9"  (1.753 m)   Wt 185 lb 3.2 oz (84 kg)   SpO2 98% Comment: RA with mask on  BMI 27.35 kg/m   General appearance: alert, cooperative and  no distress Neurologic: intact Heart: regular rate and rhythm, S1, S2 normal, no murmur, click, rub or gallop Lungs: clear to auscultation bilaterally Abdomen: soft, non-tender; bowel sounds normal; no masses,  no organomegaly and Jejunal feeding tube in place and secure Extremities: extremities normal, atraumatic, no cyanosis or edema Wound: Neck incisions well-healed, the upper 4 cm of the abdominal incision is open and being packed with saline lower portion of the incision is without evidence of infection and healing   Diagnostic Studies & Laboratory data:     Recent Radiology Findings:  Narrative & Impression  CLINICAL DATA:  Esophageal carcinoma  EXAM: CHEST - 2 VIEW  COMPARISON:  09/29/2020  FINDINGS: Right Port-A-Cath remains in place, unchanged. Left basilar subsegmental atelectasis with small left effusion. Right lung clear. Heart is normal size. No acute bony abnormality.  IMPRESSION: Small left pleural effusion with left base atelectasis.   Electronically Signed   By: Rolm Baptise M.D.   On: 10/12/2020 15:44    I have independently reviewed the above radiology studies  and reviewed the findings with the patient.   Recent Lab Findings: Lab Results  Component Value Date   WBC 14.2 (H) 10/06/2020   HGB 12.4 (L) 10/06/2020   HCT 37.3 (L) 10/06/2020   PLT 449 (H) 10/06/2020   GLUCOSE 126 (H) 10/06/2020   ALT 85 (H) 10/04/2020   AST 51 (H) 10/04/2020   NA 135 10/06/2020   K 4.4 10/06/2020   CL 102 10/06/2020   CREATININE 0.78 10/06/2020   BUN 22 (H) 10/06/2020   CO2 27 10/06/2020   INR 1.0 09/16/2020      Assessment / Plan:   #1  Stable tolerating p.o. diet following transhiatal total esophagectomy for adenocarcinoma of the distal esophagus-since patient is taking p.o. diet we will stop the nighttime tube feedings and monitor his p.o. intake-the of the J-tube in place for now #2 continue wet-to-dry saline dressings to the upper portion of  abdominal incision as it granulates in-was will see back in 1 week   Medication Changes: No orders of the defined types were placed in this encounter.     Grace Isaac MD      [address] [place] [address] Office [phone]     10/18/2020 3:30 PM

## 2020-10-16 DIAGNOSIS — K921 Melena: Secondary | ICD-10-CM | POA: Diagnosis not present

## 2020-10-16 DIAGNOSIS — C160 Malignant neoplasm of cardia: Secondary | ICD-10-CM | POA: Diagnosis not present

## 2020-10-16 DIAGNOSIS — N200 Calculus of kidney: Secondary | ICD-10-CM | POA: Diagnosis not present

## 2020-10-16 DIAGNOSIS — K219 Gastro-esophageal reflux disease without esophagitis: Secondary | ICD-10-CM | POA: Diagnosis not present

## 2020-10-16 DIAGNOSIS — Z792 Long term (current) use of antibiotics: Secondary | ICD-10-CM | POA: Diagnosis not present

## 2020-10-16 DIAGNOSIS — Z87891 Personal history of nicotine dependence: Secondary | ICD-10-CM | POA: Diagnosis not present

## 2020-10-16 DIAGNOSIS — Z434 Encounter for attention to other artificial openings of digestive tract: Secondary | ICD-10-CM | POA: Diagnosis not present

## 2020-10-16 DIAGNOSIS — Z483 Aftercare following surgery for neoplasm: Secondary | ICD-10-CM | POA: Diagnosis not present

## 2020-10-16 DIAGNOSIS — I700 Atherosclerosis of aorta: Secondary | ICD-10-CM | POA: Diagnosis not present

## 2020-10-16 DIAGNOSIS — M5430 Sciatica, unspecified side: Secondary | ICD-10-CM | POA: Diagnosis not present

## 2020-10-20 NOTE — Progress Notes (Signed)
[address]       [place] [address]             [phone]      Ayuub Kirk [place] Medical Record #[id number] Date of Birth: 12/23/1965  Referring: [name], MD Primary Care: Lillard Anes, MD Primary Cardiologist: No primary care provider on file. Medical Oncology: Dr Argentina Donovan  Chief Complaint:   POST OP FOLLOW UP OPERATIVE REPORT DATE OF PROCEDURE:  09/20/2020 PREOPERATIVE DIAGNOSIS:  Adenocarcinoma of the distal esophagus, gastroesophageal junction. POSTOPERATIVE DIAGNOSIS:  Adenocarcinoma of the distal esophagus, gastroesophageal junction. SURGICAL PROCEDURE:  Video bronchoscopy, transhiatal total esophagectomy with cervical esophagogastrotomy, pyloroplasty, placement of feeding jejunostomy tube, removal of PEG tube.  Cancer Staging Esophageal carcinoma [facility]) Staging form: Esophagus - Adenocarcinoma, AJCC 8th Edition - Clinical stage from 09/02/2020: Stage Unknown (cTX, cN2, cM0, G3) - Signed by Grace Isaac, MD on 09/02/2020 - Pathologic stage from 09/23/2020: Stage I (ypT2, pN0, cM0, G2) - Signed by Grace Isaac, MD on 09/23/2020  History of Present Illness:     Patient returns to the office for follow up  postop visit after recent discharge following transhiatal total esophagectomy on November 1.     Developed a breakdown of his abdominal incision in the area where the feeding tube had been preoperatively.  He has been doing wet-to-dry saline dressings to this area.  He has had no fever chills or evidence of infection. He notes that his p.o. diet continues to the good, he is eating solid food without difficulty.  He does take care to eat frequent small meals chew his food well.  Rare loose stools.  He is no longer using tube feedings     Past Medical History:  Diagnosis Date  . Bowel obstruction ([place])    06/21/2017 reported- "years ago"  . Cancer ([place])    basal cell face  . GERD (gastroesophageal reflux  disease)   . History of kidney stones    x 2     Social History   Tobacco Use  Smoking Status Former Smoker  . Years: 0.20  Smokeless Tobacco Former Systems developer  Tobacco Comment   quit in [place] History   Substance and Sexual Activity  Alcohol Use No   Comment: in high school- not since     Allergies  Allergen Reactions  . Penicillins Anaphylaxis  . Prednisone Anxiety    Mood swings    Current Outpatient Medications  Medication Sig Dispense Refill  . acetaminophen (TYLENOL) 325 MG tablet Take 2 tablets (650 mg total) by mouth every 6 (six) hours as needed.    Marland Kitchen ascorbic acid (VITAMIN C) 500 MG tablet Take 500 mg by mouth daily.    . cholecalciferol (VITAMIN D3) 25 MCG (1000 UNIT) tablet Take 1,000 Units by mouth daily.    . feeding supplement (ENSURE ENLIVE / ENSURE PLUS) LIQD Take 237 mLs by mouth 2 (two) times daily between meals. 14220 mL 0  . levofloxacin (LEVAQUIN) 750 MG tablet Take 1 tablet (750 mg total) by mouth daily. 7 tablet 0  . lidocaine (LIDODERM) 5 % USE 1 PATCH EXTERNALLY ONCE DAILY AS NEEDED FOR PAIN. APPLY TOPICALLY TO INTACT SKIN OVER AREA OF GREATEST PAIN. YOU MAY CUT TO SIZE MAXIMUM    . metoprolol tartrate (LOPRESSOR) 25 MG tablet Take 0.5 tablets (12.5 mg total) by mouth 2 (two) times daily. 30 tablet 1  . omeprazole (PRILOSEC) 40  MG capsule Take 40 mg by mouth every morning.    . zinc gluconate 50 MG tablet Take 50 mg by mouth daily.     No current facility-administered medications for this visit.       Physical Exam: BP 131/88 (BP Location: Right Arm, Patient Position: Sitting, Cuff Size: Normal)   Pulse 89   Temp 98.2 F (36.8 C) (Skin)   Resp 18   Ht 5\' 9"  (1.753 m)   Wt 184 lb 12.8 oz (83.8 kg)   SpO2 98% Comment: RA  BMI 27.29 kg/m  General appearance: alert, cooperative and no distress Head: Normocephalic, without obvious abnormality, atraumatic Neck: no adenopathy, no carotid bruit, no JVD, supple, symmetrical, trachea  midline and thyroid not enlarged, symmetric, no tenderness/mass/nodules Lymph nodes: Cervical, supraclavicular, and axillary nodes normal. Resp: clear to auscultation bilaterally Back: symmetric, no curvature. ROM normal. No CVA tenderness. Cardio: regular rate and rhythm, S1, S2 normal, no murmur, click, rub or gallop GI: soft, non-tender; bowel sounds normal; no masses,  no organomegaly Extremities: extremities normal, atraumatic, no cyanosis or edema and Homans sign is negative, no sign of DVT Neurologic: Grossly normal Neck incision is well-healed Abdominal incision continues to be packed with wet-to-dry saline slowly granulating.   Diagnostic Studies & Laboratory data:     Recent Radiology Findings:  Narrative & Impression  CLINICAL DATA:  Esophageal carcinoma  EXAM: CHEST - 2 VIEW  COMPARISON:  09/29/2020  FINDINGS: Right Port-A-Cath remains in place, unchanged. Left basilar subsegmental atelectasis with small left effusion. Right lung clear. Heart is normal size. No acute bony abnormality.  IMPRESSION: Small left pleural effusion with left base atelectasis.   Electronically Signed   By: Rolm Baptise M.D.   On: 10/12/2020 15:44    I have independently reviewed the above radiology studies  and reviewed the findings with the patient.   Recent Lab Findings: Lab Results  Component Value Date   WBC 14.2 (H) 10/06/2020   HGB 12.4 (L) 10/06/2020   HCT 37.3 (L) 10/06/2020   PLT 449 (H) 10/06/2020   GLUCOSE 126 (H) 10/06/2020   ALT 85 (H) 10/04/2020   AST 51 (H) 10/04/2020   NA 135 10/06/2020   K 4.4 10/06/2020   CL 102 10/06/2020   CREATININE 0.78 10/06/2020   BUN 22 (H) 10/06/2020   CO2 27 10/06/2020   INR 1.0 09/16/2020      Assessment / Plan:   #1  Stable tolerating p.o. diet following transhiatal total esophagectomy for adenocarcinoma of the distal esophagus-since patient is taking p.o. diet we will stop the nighttime tube feedings and monitor  his p.o. intake-the of the J-tube in place for now #2 continue wet-to-dry saline dressings to the upper portion of abdominal incision as it granulates in-was will see back in 2  weeks   Medication Changes: No orders of the defined types were placed in this encounter.     Grace Isaac MD      [address] [place] [address] Office [phone]     10/25/2020 12:56 PM

## 2020-10-21 ENCOUNTER — Ambulatory Visit (INDEPENDENT_AMBULATORY_CARE_PROVIDER_SITE_OTHER): Payer: Self-pay | Admitting: Cardiothoracic Surgery

## 2020-10-21 ENCOUNTER — Encounter: Payer: Self-pay | Admitting: Cardiothoracic Surgery

## 2020-10-21 ENCOUNTER — Other Ambulatory Visit: Payer: Self-pay

## 2020-10-21 VITALS — BP 131/88 | HR 89 | Temp 98.2°F | Resp 18 | Ht 69.0 in | Wt 184.8 lb

## 2020-10-21 DIAGNOSIS — C159 Malignant neoplasm of esophagus, unspecified: Secondary | ICD-10-CM

## 2020-10-22 DIAGNOSIS — N200 Calculus of kidney: Secondary | ICD-10-CM | POA: Diagnosis not present

## 2020-10-22 DIAGNOSIS — M5430 Sciatica, unspecified side: Secondary | ICD-10-CM | POA: Diagnosis not present

## 2020-10-22 DIAGNOSIS — C160 Malignant neoplasm of cardia: Secondary | ICD-10-CM | POA: Diagnosis not present

## 2020-10-22 DIAGNOSIS — I700 Atherosclerosis of aorta: Secondary | ICD-10-CM | POA: Diagnosis not present

## 2020-10-22 DIAGNOSIS — K219 Gastro-esophageal reflux disease without esophagitis: Secondary | ICD-10-CM | POA: Diagnosis not present

## 2020-10-22 DIAGNOSIS — Z483 Aftercare following surgery for neoplasm: Secondary | ICD-10-CM | POA: Diagnosis not present

## 2020-10-22 DIAGNOSIS — Z87891 Personal history of nicotine dependence: Secondary | ICD-10-CM | POA: Diagnosis not present

## 2020-10-22 DIAGNOSIS — Z434 Encounter for attention to other artificial openings of digestive tract: Secondary | ICD-10-CM | POA: Diagnosis not present

## 2020-10-22 DIAGNOSIS — Z792 Long term (current) use of antibiotics: Secondary | ICD-10-CM | POA: Diagnosis not present

## 2020-10-22 DIAGNOSIS — K921 Melena: Secondary | ICD-10-CM | POA: Diagnosis not present

## 2020-10-25 ENCOUNTER — Other Ambulatory Visit: Payer: Self-pay | Admitting: Oncology

## 2020-10-25 DIAGNOSIS — C159 Malignant neoplasm of esophagus, unspecified: Secondary | ICD-10-CM

## 2020-10-25 NOTE — Progress Notes (Signed)
Thomas Gonzalez  [address] [place],  [name]  [address] [phone]  Clinic Day:  10/26/2020  Referring physician: Lillard Anes,*   HISTORY OF PRESENT ILLNESS:  The patient is a 54 y.o. 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  Furthermore, there was evidence of another esophageal adenocarcinoma in his midesophagus.  He completed neoadjuvant chemoradiation, which consisted of 3 cycles of FOLFOX chemotherapy.  As scans done afterwards showed no evidence of disease progression, he underwent a transhiatal total esophagectomy in November 2021.  Pathology from this surgery showed persistent adenocarcinoma which extended into the muscularis propria level of her stomach.  14 lymph nodes were removed, for which none contained cancer.  He comes in today to go over his surgical pathology.  Since his surgery, the patient has been doing well.  He feels he is recuperating well from his recent surgery.  He denies having any dysphagia, abdominal discomfort or other GI symptoms which concern him for either disease recurrence or postoperative complications.    PHYSICAL EXAM:  Blood pressure 120/83, pulse 80, temperature 98.4 F (36.9 C), resp. rate 16, height 5\' 9"  (1.753 m), weight 185 lb (83.9 kg), SpO2 96 %. Wt Readings from Last 3 Encounters:  10/26/20 185 lb (83.9 kg)  10/21/20 184 lb 12.8 oz (83.8 kg)  10/12/20 185 lb 3.2 oz (84 kg)   Body mass index is 27.32 kg/m. Performance status (ECOG): 1 - Symptomatic but completely ambulatory Physical Exam Constitutional:      Appearance: Normal appearance. He is not ill-appearing.  HENT:     Mouth/Throat:     Mouth: Mucous membranes are moist.     Pharynx: Oropharynx is clear. No oropharyngeal exudate or posterior oropharyngeal erythema.  Cardiovascular:     Rate and Rhythm: Normal rate and regular rhythm.     Heart sounds: No murmur heard.  No friction rub. No gallop.    Pulmonary:     Effort: Pulmonary effort is normal. No respiratory distress.     Breath sounds: Normal breath sounds. No wheezing, rhonchi or rales.  Abdominal:     General: Bowel sounds are normal. There is no distension.     Palpations: Abdomen is soft. There is no mass.     Tenderness: There is no abdominal tenderness.  Musculoskeletal:        General: No swelling.     Right lower leg: No edema.     Left lower leg: No edema.  Lymphadenopathy:     Cervical: No cervical adenopathy.     Upper Body:     Right upper body: No supraclavicular or axillary adenopathy.     Left upper body: No supraclavicular or axillary adenopathy.     Lower Body: No right inguinal adenopathy. No left inguinal adenopathy.  Skin:    General: Skin is warm.     Coloration: Skin is not jaundiced.     Findings: No lesion or rash.  Neurological:     General: No focal deficit present.     Mental Status: He is alert and oriented to person, place, and time. Mental status is at baseline.     Cranial Nerves: Cranial nerves are intact.  Psychiatric:        Mood and Affect: Mood normal.        Behavior: Behavior normal.        Thought Content: Thought content normal.     LABS:   CBC Latest Ref Rng &  Units 10/26/2020 10/06/2020 10/04/2020  WBC - 7.4 14.2(H) 11.1(H)  Hemoglobin 13.5 - 17.5 14.6 12.4(L) 12.4(L)  Hematocrit 41 - 53 42 37.3(L) 37.3(L)  Platelets 150 - 399 262 449(H) 363   CMP Latest Ref Rng & Units 10/26/2020 10/06/2020 10/04/2020  Glucose 70 - 99 mg/dL - 126(H) 131(H)  BUN 4 - 21 20 22(H) 21(H)  Creatinine 0.6 - 1.3 0.7 0.78 0.72  Sodium 137 - 147 138 135 133(L)  Potassium 3.4 - 5.3 4.3 4.4 4.2  Chloride 99 - 108 103 102 102  CO2 13 - 22 30(A) 27 24  Calcium 8.7 - 10.7 9.4 8.6(L) 8.3(L)  Total Protein 6.5 - 8.1 g/dL - - 5.4(L)  Total Bilirubin 0.3 - 1.2 mg/dL - - 0.5  Alkaline Phos 25 - 125 176(A) - 193(H)  AST 14 - 40 50(A) - 51(H)  ALT 10 - 40 82(A) - 85(H)    PATHOLOGY:   ESOPHAGOGASTRECTOMY:  - Adenocarcinoma at the gastroesophageal junction associated with  ulceration and fibrosis.  - Carcinoma extends into muscularis propria.  - Margins not involved.  - Fourteen lymph nodes with no metastatic carcinoma (0/14).  - One lymph node has a 0.4 cm nodule of fibrosis and histiocytes.  - See oncology table and comment.   ONCOLOGY TABLE:  ESOPHAGUS:  Procedure: Esophagogastrectomy.  Tumor Site: Gastroesophageal junction.  Relationship of Tumor to Esophagogastric Junction: Tumor situated at  esophagogastric junction.  Tumor Size: 6 x 2 cm.  Histologic Type: Adenocarcinoma.  Histologic Grade: Moderately differentiated.  Tumor Extension: Into muscularis propria, see comment.  Margins: Not involved by tumor.  Treatment Effect: Fibrosis and inflammation consistent with treatment  effect.  Lymphovascular Invasion: Not identified.  Regional Lymph Nodes:    Number of Lymph Nodes Involved: 0    Number of Lymph Nodes Examined: 14, 1 with 0.4 cm fibrous nodule.  Pathologic Stage Classification (pTNM, AJCC 8th Edition): ypT2, ypN0  Ancillary Studies: Can be performed if requested.  Representative Tumor Block: B3, B5, B6, B7, B8 and B9  Comment(s): There are large areas of fibrosis and focal ulceration  consistent with treatment effect within which are scattered foci of  adenocarcinoma. The tumor is situated at the gastroesophageal junction  with involvement of the distal esophagus and proximal stomach. The  carcinoma extends into the outer portions of the muscularis propria but  involvement of the adjacent adventitial connective tissue is not  identified. One of the fourteen lymph nodes has a 0.4 cm nodule of  fibrosis and foamy histiocytes.  ([id number])    ASSESSMENT & PLAN:  Assessment/Plan:  A 54 y.o. 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  Although he had a remarkable response to his neoadjuvant  chemoradiation, the fact that persistent disease was presents highlights his increased risk of disease recurrence over time.  To minimize the chances of this occurring, I will place him on year-long adjuvant nivolumab immunotherapy.  It will be given at 480 mg once every 4 weeks.  The patient was made aware of the potential side effects that can go along with immunotherapy, including severe shortness of breath and diarrhea.  His first cycle of nivolumab will be given on December 14th.  I will see him back 4 weeks later before he heads into his 2nd cycle of maintenance nivolumab immunotherapy.  The patient understands all the plans discussed today and is in agreement with them.      Clarissa Laird Macarthur Critchley, MD

## 2020-10-26 ENCOUNTER — Other Ambulatory Visit: Payer: Self-pay | Admitting: Hematology and Oncology

## 2020-10-26 ENCOUNTER — Other Ambulatory Visit: Payer: Self-pay | Admitting: Oncology

## 2020-10-26 ENCOUNTER — Other Ambulatory Visit: Payer: Self-pay

## 2020-10-26 ENCOUNTER — Inpatient Hospital Stay: Payer: BC Managed Care – PPO | Attending: Oncology | Admitting: Oncology

## 2020-10-26 ENCOUNTER — Inpatient Hospital Stay: Payer: BC Managed Care – PPO

## 2020-10-26 ENCOUNTER — Encounter: Payer: Self-pay | Admitting: Oncology

## 2020-10-26 VITALS — BP 120/83 | HR 80 | Temp 98.4°F | Resp 16 | Ht 69.0 in | Wt 185.0 lb

## 2020-10-26 DIAGNOSIS — C159 Malignant neoplasm of esophagus, unspecified: Secondary | ICD-10-CM | POA: Diagnosis not present

## 2020-10-26 DIAGNOSIS — Z0001 Encounter for general adult medical examination with abnormal findings: Secondary | ICD-10-CM | POA: Diagnosis not present

## 2020-10-26 DIAGNOSIS — Z79899 Other long term (current) drug therapy: Secondary | ICD-10-CM | POA: Insufficient documentation

## 2020-10-26 DIAGNOSIS — Z5112 Encounter for antineoplastic immunotherapy: Secondary | ICD-10-CM | POA: Insufficient documentation

## 2020-10-26 DIAGNOSIS — C160 Malignant neoplasm of cardia: Secondary | ICD-10-CM | POA: Insufficient documentation

## 2020-10-26 LAB — HEPATIC FUNCTION PANEL
ALT: 82 — AB (ref 10–40)
AST: 50 — AB (ref 14–40)
Alkaline Phosphatase: 176 — AB (ref 25–125)
Bilirubin, Total: 0.6

## 2020-10-26 LAB — CBC
MCV: 97 (ref 76–111)
RBC: 4.3 (ref 3.87–5.11)

## 2020-10-26 LAB — BASIC METABOLIC PANEL
BUN: 20 (ref 4–21)
CO2: 30 — AB (ref 13–22)
Chloride: 103 (ref 99–108)
Creatinine: 0.7 (ref 0.6–1.3)
Glucose: 108
Potassium: 4.3 (ref 3.4–5.3)
Sodium: 138 (ref 137–147)

## 2020-10-26 LAB — COMPREHENSIVE METABOLIC PANEL
Albumin: 3.8 (ref 3.5–5.0)
Calcium: 9.4 (ref 8.7–10.7)

## 2020-10-26 LAB — CBC AND DIFFERENTIAL
HCT: 42 (ref 41–53)
Hemoglobin: 14.6 (ref 13.5–17.5)
Neutrophils Absolute: 5.03
Platelets: 262 (ref 150–399)
WBC: 7.4

## 2020-10-27 ENCOUNTER — Other Ambulatory Visit: Payer: Self-pay | Admitting: Oncology

## 2020-10-27 ENCOUNTER — Telehealth: Payer: Self-pay

## 2020-10-27 NOTE — Telephone Encounter (Signed)
Thomas Gonzalez with [facility] contacted the office [phone] stating that patient is to be discharged from home health services per insurance due to patient independent.  Patient was seen by home health x4 days.  Patient is s/p Esophagectomy with Dr. Servando Snare 09/20/2020.

## 2020-11-01 ENCOUNTER — Other Ambulatory Visit: Payer: Self-pay

## 2020-11-01 ENCOUNTER — Inpatient Hospital Stay (INDEPENDENT_AMBULATORY_CARE_PROVIDER_SITE_OTHER): Payer: BC Managed Care – PPO | Admitting: Hematology and Oncology

## 2020-11-01 DIAGNOSIS — C159 Malignant neoplasm of esophagus, unspecified: Secondary | ICD-10-CM

## 2020-11-01 MED ORDER — ONDANSETRON HCL 4 MG PO TABS: 4.0000 mg | ORAL_TABLET | ORAL | 3 refills | Status: DC | PRN

## 2020-11-01 MED ORDER — PROCHLORPERAZINE MALEATE 10 MG PO TABS: 10.0000 mg | ORAL_TABLET | Freq: Four times a day (QID) | ORAL | 3 refills | Status: DC | PRN

## 2020-11-01 NOTE — Progress Notes (Addendum)
The patient is a with stage IVA (T3 N2 M0) adenocarcinoma of his gastroesophageal junction .  Patient presents to clinic today with his wife for chemotherapy education and palliative care consult.  We will start Nivolumab IV every 4 weeks.  We will send in prescriptions for prochlorperazine and ondansetron.  The patient verbalizes understanding of and agreement to the plan as discussed today.  Provided general information including the following: 1.  Date of education: 11-01-2020 2.  Physician name: Dr. Bobby Rumpf 3.  Diagnosis:  adenocarcinoma of his gastroesophageal junction 4.  Stage: Stage IVA (T3 N2 M0) 5.  Curative  6.  Chemotherapy plan including drugs and how often: Nivolumab IV every 4 weeks 7.  Start date: 11-01-2020 8.  Other referrals: No other referrals at this time 9.  The patient is to call our office with any questions or concerns.  Our office number [phone], if after hours or on the weekend, call the same number and wait for the answering service.  There is always an oncologist on call 10.  Medications prescribed: Zofran and compazine 11.  The patient has verbalized understanding of the treatment plan and has no barriers to adherence or understanding.  Obtained signed consent from patient.  Discussed symptoms including 1.  Low blood counts including red blood cells, white blood cells and platelets. 2. Infection including to avoid large crowds, wash hands frequently, and stay away from people who were sick.  If fever develops of 100.4 or higher, call our office. 3.  Mucositis-given instructions on mouth rinse (baking soda and salt mixture).  Keep mouth clean.  Use soft bristle toothbrush.  If mouth sores develop, call our clinic. 4.  Nausea/vomiting-gave prescriptions for ondansetron 4 mg every 4 hours as needed for nausea, may take around the clock if persistent.  Compazine 10 mg every 6 hours, may take around the clock if persistent. 5.  Diarrhea-use over-the-counter Imodium.   Call clinic if not controlled. 6.  Constipation-use senna, 1 to 2 tablets twice a day.  If no BM in 2 to 3 days call the clinic. 7.  Loss of appetite-try to eat small meals every 2-3 hours.  Call clinic if not eating. 8.  Taste changes-zinc 500 mg daily.  If becomes severe call clinic. 9.  Alcoholic beverages. 10.  Drink 2 to 3 quarts of water per day. 11.  Peripheral neuropathy-patient to call if numbness or tingling in hands or feet is persistent    Gave information on the supportive care team and how to contact them regarding services.  Discussed advanced directives.  The patient does not have their advanced directives but will look at the copy provided in their notebook and will call with any questions. [employer] Social worker Advanced directives  Answered questions to patient satisfaction.  Patient is to call with any further questions or concerns.  Time spent on this palliative care/chemotherapy education was 60 minutes with more than 50% spent discussing diagnosis, prognosis and symptom management.

## 2020-11-02 ENCOUNTER — Telehealth: Payer: Self-pay | Admitting: Oncology

## 2020-11-02 ENCOUNTER — Inpatient Hospital Stay: Payer: BC Managed Care – PPO

## 2020-11-02 NOTE — Telephone Encounter (Signed)
Per 12/14 Staff Message,today's  Infusion rescheduled to 12/17 due to Authorization.  Patient notified

## 2020-11-04 ENCOUNTER — Other Ambulatory Visit: Payer: Self-pay

## 2020-11-04 ENCOUNTER — Ambulatory Visit (INDEPENDENT_AMBULATORY_CARE_PROVIDER_SITE_OTHER): Payer: Self-pay | Admitting: Cardiothoracic Surgery

## 2020-11-04 VITALS — BP 118/85 | HR 85 | Temp 97.6°F | Resp 20 | Ht 69.0 in | Wt 186.0 lb

## 2020-11-04 DIAGNOSIS — Z09 Encounter for follow-up examination after completed treatment for conditions other than malignant neoplasm: Secondary | ICD-10-CM

## 2020-11-04 DIAGNOSIS — C159 Malignant neoplasm of esophagus, unspecified: Secondary | ICD-10-CM

## 2020-11-05 ENCOUNTER — Inpatient Hospital Stay: Payer: BC Managed Care – PPO

## 2020-11-05 DIAGNOSIS — C159 Malignant neoplasm of esophagus, unspecified: Secondary | ICD-10-CM | POA: Diagnosis not present

## 2020-11-10 ENCOUNTER — Other Ambulatory Visit: Payer: Self-pay | Admitting: Hematology and Oncology

## 2020-11-10 ENCOUNTER — Telehealth: Payer: Self-pay | Admitting: *Deleted

## 2020-11-10 ENCOUNTER — Inpatient Hospital Stay: Payer: BC Managed Care – PPO

## 2020-11-10 DIAGNOSIS — Z0001 Encounter for general adult medical examination with abnormal findings: Secondary | ICD-10-CM | POA: Diagnosis not present

## 2020-11-10 DIAGNOSIS — Z79899 Other long term (current) drug therapy: Secondary | ICD-10-CM | POA: Diagnosis not present

## 2020-11-10 DIAGNOSIS — C160 Malignant neoplasm of cardia: Secondary | ICD-10-CM | POA: Diagnosis not present

## 2020-11-10 DIAGNOSIS — C159 Malignant neoplasm of esophagus, unspecified: Secondary | ICD-10-CM

## 2020-11-10 DIAGNOSIS — Z5112 Encounter for antineoplastic immunotherapy: Secondary | ICD-10-CM | POA: Diagnosis not present

## 2020-11-10 LAB — CBC AND DIFFERENTIAL
HCT: 43 (ref 41–53)
Hemoglobin: 14.7 (ref 13.5–17.5)
Neutrophils Absolute: 5.04
Platelets: 272 (ref 150–399)
WBC: 7.1

## 2020-11-10 LAB — COMPREHENSIVE METABOLIC PANEL
Albumin: 3.8 (ref 3.5–5.0)
Calcium: 9.5 (ref 8.7–10.7)

## 2020-11-10 LAB — BASIC METABOLIC PANEL
BUN: 24 — AB (ref 4–21)
CO2: 31 — AB (ref 13–22)
Chloride: 104 (ref 99–108)
Creatinine: 0.8 (ref 0.6–1.3)
Glucose: 107
Potassium: 5 (ref 3.4–5.3)
Sodium: 140 (ref 137–147)

## 2020-11-10 LAB — TSH: TSH: 0.527 u[IU]/mL (ref 0.350–4.500)

## 2020-11-10 LAB — HEPATIC FUNCTION PANEL
ALT: 57 — AB (ref 10–40)
AST: 47 — AB (ref 14–40)
Alkaline Phosphatase: 169 — AB (ref 25–125)
Bilirubin, Total: 0.5

## 2020-11-10 LAB — CBC: RBC: 4.42 (ref 3.87–5.11)

## 2020-11-10 NOTE — Telephone Encounter (Signed)
Mr. Bulkley called stating he went to the movies with his wife last night & when they got to the car he noticed his J tube had come out. Pt denies any sign/symptoms of infection or drainage. Pt states he currently has the site covered with a bandage. Per pt, he has not been using the J tube as his PO intake has improved. Message sent to Dr. Servando Snare for further instructions.

## 2020-11-11 ENCOUNTER — Telehealth: Payer: Self-pay

## 2020-11-11 ENCOUNTER — Other Ambulatory Visit: Payer: Self-pay

## 2020-11-11 ENCOUNTER — Inpatient Hospital Stay: Payer: BC Managed Care – PPO

## 2020-11-11 VITALS — BP 120/82 | HR 82 | Temp 98.0°F | Resp 20 | Ht 69.0 in | Wt 184.0 lb

## 2020-11-11 DIAGNOSIS — C159 Malignant neoplasm of esophagus, unspecified: Secondary | ICD-10-CM

## 2020-11-11 DIAGNOSIS — Z79899 Other long term (current) drug therapy: Secondary | ICD-10-CM | POA: Diagnosis not present

## 2020-11-11 DIAGNOSIS — Z5112 Encounter for antineoplastic immunotherapy: Secondary | ICD-10-CM | POA: Diagnosis not present

## 2020-11-11 DIAGNOSIS — C160 Malignant neoplasm of cardia: Secondary | ICD-10-CM | POA: Diagnosis not present

## 2020-11-11 LAB — T4: T4, Total: 6.6 ug/dL (ref 4.5–12.0)

## 2020-11-11 MED ORDER — HEPARIN SOD (PORK) LOCK FLUSH 100 UNIT/ML IV SOLN
500.0000 [IU] | Freq: Once | INTRAVENOUS | Status: AC | PRN
  Administered 2020-11-11: 500 [IU]
  Filled 2020-11-11: qty 5

## 2020-11-11 MED ORDER — SODIUM CHLORIDE 0.9 % IV SOLN
Freq: Once | INTRAVENOUS | Status: AC
  Filled 2020-11-11: qty 250

## 2020-11-11 MED ORDER — SODIUM CHLORIDE 0.9 % IV SOLN
480.0000 mg | Freq: Once | INTRAVENOUS | Status: AC
  Administered 2020-11-11: 480 mg via INTRAVENOUS
  Filled 2020-11-11 (×2): qty 48

## 2020-11-11 NOTE — Patient Instructions (Signed)
[facility] Discharge Instructions for Patients Receiving Chemotherapy  Today you received the following chemotherapy agents pembrolizumab  To help prevent nausea and vomiting after your treatment, we encourage you to take your nausea medication.   If you develop nausea and vomiting that is not controlled by your nausea medication, call the clinic.   BELOW ARE SYMPTOMS THAT SHOULD BE REPORTED IMMEDIATELY:  *FEVER GREATER THAN 100.5 F  *CHILLS WITH OR WITHOUT FEVER  NAUSEA AND VOMITING THAT IS NOT CONTROLLED WITH YOUR NAUSEA MEDICATION  *UNUSUAL SHORTNESS OF BREATH  *UNUSUAL BRUISING OR BLEEDING  TENDERNESS IN MOUTH AND THROAT WITH OR WITHOUT PRESENCE OF ULCERS  *URINARY PROBLEMS  *BOWEL PROBLEMS  UNUSUAL RASH Items with * indicate a potential emergency and should be followed up as soon as possible.  Feel free to call the clinic should you have any questions or concerns at The clinic phone number is [phone].  Please show the CHEMO ALERT CARD at check-in to the Emergency Department and triage nurse.   

## 2020-11-11 NOTE — Telephone Encounter (Signed)
Spoke with Dr Juanetta Beets this am and he states that the J-tube site is "shrinking" (closing up), he is keeping site clean and dry. He is scheduled to see Dr Servando Snare on 11/25/20.  He was instructed to call back if he has any problems before appt date.

## 2020-11-12 DIAGNOSIS — J0100 Acute maxillary sinusitis, unspecified: Secondary | ICD-10-CM | POA: Diagnosis not present

## 2020-11-12 DIAGNOSIS — Z20828 Contact with and (suspected) exposure to other viral communicable diseases: Secondary | ICD-10-CM | POA: Diagnosis not present

## 2020-11-15 ENCOUNTER — Telehealth: Payer: Self-pay

## 2020-11-15 NOTE — Telephone Encounter (Signed)
I spoke with pt to see how he has been doing since his first treatment. Pt states, "I'm doing great". He denies N/V, diarrhea, and rashes. Reminded pt the importance of calling us if temp over 100.4, day or night. Pt verbalized understanding.

## 2020-11-16 NOTE — Progress Notes (Signed)
PT STABLE AT TIME OF DISCHARGE 

## 2020-11-21 NOTE — Progress Notes (Signed)
[address]       [place] [address]             [phone]      Thomas Gonzalez [facility] Medical Record #[id number] Date of Birth: 1965/12/19  Referring: Weston Settle, MD Primary Care: Abigail Miyamoto, MD Primary Cardiologist: No primary care provider on file. Medical Oncology: Dr Abran Cantor  Chief Complaint:   POST OP FOLLOW UP OPERATIVE REPORT DATE OF PROCEDURE:  09/20/2020 PREOPERATIVE DIAGNOSIS:  Adenocarcinoma of the distal esophagus, gastroesophageal junction. POSTOPERATIVE DIAGNOSIS:  Adenocarcinoma of the distal esophagus, gastroesophageal junction. SURGICAL PROCEDURE:  Video bronchoscopy, transhiatal total esophagectomy with cervical esophagogastrotomy, pyloroplasty, placement of feeding jejunostomy tube, removal of PEG tube.  Cancer Staging Esophageal carcinoma [facility]) Staging form: Esophagus - Adenocarcinoma, AJCC 8th Edition - Clinical stage from 09/02/2020: Stage Unknown (cTX, cN2, cM0, G3) - Signed by Delight Ovens, MD on 09/02/2020 - Pathologic stage from 09/23/2020: Stage I (ypT2, pN0, cM0, G2) - Signed by Delight Ovens, MD on 09/23/2020  History of Present Illness:     Patient returns to the office for follow up  postop visit after recent discharge following transhiatal total esophagectomy on November 1.     Developed a breakdown of his abdominal incision in the area where the feeding tube had been preoperatively.  He has been doing wet-to-dry saline dressings to this area.  He has had no fever chills or evidence of infection. He notes that his p.o. diet continues to the good, he is eating solid food without difficulty.  He does take care to eat frequent small meals chew his food well.  Rare loose stools.  He is no longer using tube feedings     Past Medical History:  Diagnosis Date  . Bowel obstruction (HCC)    06/21/2017 reported- "years ago"  . Cancer (HCC)    basal cell face  . GERD (gastroesophageal reflux  disease)   . History of kidney stones    x 2     Social History   Tobacco Use  Smoking Status Former Smoker  . Years: 0.20  Smokeless Tobacco Former Neurosurgeon  Tobacco Comment   quit in 1985    Social History   Substance and Sexual Activity  Alcohol Use No   Comment: in high school- not since     Allergies  Allergen Reactions  . Penicillins Anaphylaxis  . Prednisone Anxiety    Mood swings    Current Outpatient Medications  Medication Sig Dispense Refill  . acetaminophen (TYLENOL) 325 MG tablet Take 2 tablets (650 mg total) by mouth every 6 (six) hours as needed.    Marland Kitchen ascorbic acid (VITAMIN C) 500 MG tablet Take 500 mg by mouth daily.    . cholecalciferol (VITAMIN D3) 25 MCG (1000 UNIT) tablet Take 1,000 Units by mouth daily.    Marland Kitchen levofloxacin (LEVAQUIN) 750 MG tablet Take 1 tablet (750 mg total) by mouth daily. 7 tablet 0  . lidocaine (LIDODERM) 5 % USE 1 PATCH EXTERNALLY ONCE DAILY AS NEEDED FOR PAIN. APPLY TOPICALLY TO INTACT SKIN OVER AREA OF GREATEST PAIN. YOU MAY CUT TO SIZE MAXIMUM    . metoprolol tartrate (LOPRESSOR) 25 MG tablet Take 0.5 tablets (12.5 mg total) by mouth 2 (two) times daily. 30 tablet 1  . omeprazole (PRILOSEC) 40 MG capsule Take 40 mg by mouth every morning.    . ondansetron (ZOFRAN) 4 MG tablet Take 1 tablet (4 mg total)  by mouth every 4 (four) hours as needed for nausea. 90 tablet 3  . prochlorperazine (COMPAZINE) 10 MG tablet Take 1 tablet (10 mg total) by mouth every 6 (six) hours as needed for nausea or vomiting. 90 tablet 3  . zinc gluconate 50 MG tablet Take 50 mg by mouth daily.    Marland Kitchen oxyCODONE-acetaminophen (PERCOCET) 10-325 MG tablet Take 1 tablet by mouth every 4 (four) hours as needed for pain.     No current facility-administered medications for this visit.       Physical Exam: BP 118/85   Pulse 85   Temp 97.6 F (36.4 C) (Skin)   Resp 20   Ht 5\' 9"  (1.753 m)   Wt 186 lb (84.4 kg)   SpO2 96% Comment: RA  BMI 27.47 kg/m   General appearance: alert, cooperative and no distress Head: Normocephalic, without obvious abnormality, atraumatic Neck: no adenopathy, no carotid bruit, no JVD, supple, symmetrical, trachea midline and thyroid not enlarged, symmetric, no tenderness/mass/nodules Lymph nodes: Cervical, supraclavicular, and axillary nodes normal. Resp: clear to auscultation bilaterally Back: symmetric, no curvature. ROM normal. No CVA tenderness. Cardio: regular rate and rhythm, S1, S2 normal, no murmur, click, rub or gallop GI: soft, non-tender; bowel sounds normal; no masses,  no organomegaly Extremities: extremities normal, atraumatic, no cyanosis or edema and Homans sign is negative, no sign of DVT Neurologic: Grossly normal Neck incision is well-healed Abdominal incision continues to be packed with wet-to-dry saline slowly granulating.   Diagnostic Studies & Laboratory data:     Recent Radiology Findings:  Narrative & Impression  CLINICAL DATA:  Esophageal carcinoma  EXAM: CHEST - 2 VIEW  COMPARISON:  09/29/2020  FINDINGS: Right Port-A-Cath remains in place, unchanged. Left basilar subsegmental atelectasis with small left effusion. Right lung clear. Heart is normal size. No acute bony abnormality.  IMPRESSION: Small left pleural effusion with left base atelectasis.   Electronically Signed   By: Rolm Baptise M.D.   On: 10/12/2020 15:44    I have independently reviewed the above radiology studies  and reviewed the findings with the patient.   Recent Lab Findings: Lab Results  Component Value Date   WBC 7.1 11/10/2020   HGB 14.7 11/10/2020   HCT 43 11/10/2020   PLT 272 11/10/2020   GLUCOSE 126 (H) 10/06/2020   ALT 57 (A) 11/10/2020   AST 47 (A) 11/10/2020   NA 140 11/10/2020   K 5.0 11/10/2020   CL 104 11/10/2020   CREATININE 0.8 11/10/2020   BUN 24 (A) 11/10/2020   CO2 31 (A) 11/10/2020   TSH 0.527 11/10/2020   INR 1.0 09/16/2020      Assessment / Plan:   #1   Stable tolerating p.o. diet following transhiatal total esophagectomy for adenocarcinoma of the distal esophagus-since patient is taking p.o. diet we will stop the nighttime tube feedings and monitor his p.o. intake-the of the J-tube in place for now #2 continue wet-to-dry saline dressings to the upper portion of abdominal incision as it granulates in-was will see back in 3  weeks   Medication Changes: No orders of the defined types were placed in this encounter.     Grace Isaac MD      [place] [address] [place] [address] Office [phone]     11/21/2020 1:21 PM

## 2020-11-25 ENCOUNTER — Ambulatory Visit (INDEPENDENT_AMBULATORY_CARE_PROVIDER_SITE_OTHER): Payer: Self-pay | Admitting: Cardiothoracic Surgery

## 2020-11-25 ENCOUNTER — Other Ambulatory Visit: Payer: Self-pay

## 2020-11-25 VITALS — BP 134/89 | HR 90 | Resp 20 | Ht 69.0 in | Wt 182.0 lb

## 2020-11-25 DIAGNOSIS — Z9049 Acquired absence of other specified parts of digestive tract: Secondary | ICD-10-CM

## 2020-11-25 DIAGNOSIS — Z9889 Other specified postprocedural states: Secondary | ICD-10-CM

## 2020-11-25 NOTE — Progress Notes (Signed)
[address]       [place] [address]             [phone]      Ibrahem Volkman [facility] Medical Record #[id number] Date of Birth: 1966-02-11  Referring: Abigail Miyamoto,* Primary Care: Abigail Miyamoto, MD Primary Cardiologist: No primary care provider on file. Medical Oncology: Dr Abran Cantor  Chief Complaint:   POST OP FOLLOW UP OPERATIVE REPORT DATE OF PROCEDURE:  09/20/2020 PREOPERATIVE DIAGNOSIS:  Adenocarcinoma of the distal esophagus, gastroesophageal junction. POSTOPERATIVE DIAGNOSIS:  Adenocarcinoma of the distal esophagus, gastroesophageal junction. SURGICAL PROCEDURE:  Video bronchoscopy, transhiatal total esophagectomy with cervical esophagogastrotomy, pyloroplasty, placement of feeding jejunostomy tube, removal of PEG tube.  Cancer Staging Esophageal carcinoma [facility]) Staging form: Esophagus - Adenocarcinoma, AJCC 8th Edition - Clinical stage from 09/02/2020: Stage Unknown (cTX, cN2, cM0, G3) - Signed by Delight Ovens, MD on 09/02/2020 - Pathologic stage from 09/23/2020: Stage I (ypT2, pN0, cM0, G2) - Signed by Delight Ovens, MD on 09/23/2020  History of Present Illness:     Patient returns to the office for follow up  postop visit after recent discharge following transhiatal total esophagectomy on November 1.    Since his last visit he has continued with wet-to-dry saline dressings to this abdominal wound.  Also notes that he went to the Spider-Man movie and while at the movie his jejunostomy tube fell out-since he has been eating a diet without difficulty we have left it out.  Patient's wife notes that he has had difficulty sleeping, and has had depression symptoms, as he is usually active and is unable to do the things he usually does. -He will discuss the symptoms with Dr. Marina Goodell in regards to possible use of antidepressant medication.       Past Medical History:  Diagnosis Date  . Bowel obstruction (HCC)     06/21/2017 reported- "years ago"  . Cancer (HCC)    basal cell face  . GERD (gastroesophageal reflux disease)   . History of kidney stones    x 2     Social History   Tobacco Use  Smoking Status Former Smoker  . Years: 0.20  Smokeless Tobacco Former Neurosurgeon  Tobacco Comment   quit in 1985    Social History   Substance and Sexual Activity  Alcohol Use No   Comment: in high school- not since     Allergies  Allergen Reactions  . Penicillins Anaphylaxis  . Prednisone Anxiety    Mood swings    Current Outpatient Medications  Medication Sig Dispense Refill  . acetaminophen (TYLENOL) 325 MG tablet Take 2 tablets (650 mg total) by mouth every 6 (six) hours as needed.    Marland Kitchen ascorbic acid (VITAMIN C) 500 MG tablet Take 500 mg by mouth daily.    . cholecalciferol (VITAMIN D3) 25 MCG (1000 UNIT) tablet Take 1,000 Units by mouth daily.    Marland Kitchen levofloxacin (LEVAQUIN) 750 MG tablet Take 1 tablet (750 mg total) by mouth daily. 7 tablet 0  . lidocaine (LIDODERM) 5 % USE 1 PATCH EXTERNALLY ONCE DAILY AS NEEDED FOR PAIN. APPLY TOPICALLY TO INTACT SKIN OVER AREA OF GREATEST PAIN. YOU MAY CUT TO SIZE MAXIMUM    . metoprolol tartrate (LOPRESSOR) 25 MG tablet Take 0.5 tablets (12.5 mg total) by mouth 2 (two) times daily. 30 tablet 1  . omeprazole (PRILOSEC) 40 MG capsule Take 40 mg by mouth every morning.    Marland Kitchen  ondansetron (ZOFRAN) 4 MG tablet Take 1 tablet (4 mg total) by mouth every 4 (four) hours as needed for nausea. 90 tablet 3  . oxyCODONE-acetaminophen (PERCOCET) 10-325 MG tablet Take 1 tablet by mouth every 4 (four) hours as needed for pain.    Marland Kitchen prochlorperazine (COMPAZINE) 10 MG tablet Take 1 tablet (10 mg total) by mouth every 6 (six) hours as needed for nausea or vomiting. 90 tablet 3  . zinc gluconate 50 MG tablet Take 50 mg by mouth daily.     No current facility-administered medications for this visit.       Physical Exam: BP 134/89   Pulse 90   Resp 20   Ht 5\' 9"  (1.753 m)    Wt 182 lb (82.6 kg)   SpO2 96% Comment: RA  BMI 26.88 kg/m  General appearance: alert, cooperative and appears stated age Neck: no adenopathy, no carotid bruit, no JVD, supple, symmetrical, trachea midline and thyroid not enlarged, symmetric, no tenderness/mass/nodules Cardio: regular rate and rhythm, S1, S2 normal, no murmur, click, rub or gallop GI: soft, non-tender; bowel sounds normal; no masses,  no organomegaly Extremities: extremities normal, atraumatic, no cyanosis or edema and Homans sign is negative, no sign of DVT Neurologic: Grossly normal J-tube sites completely healed over, the wound is clean without evidence of infection but has not started contracting much yet, Dressing was changed we will continue with daily wet-to-dry dressings  Diagnostic Studies & Laboratory data:     Recent Radiology Findings:  Narrative & Impression  CLINICAL DATA:  Esophageal carcinoma  EXAM: CHEST - 2 VIEW  COMPARISON:  09/29/2020  FINDINGS: Right Port-A-Cath remains in place, unchanged. Left basilar subsegmental atelectasis with small left effusion. Right lung clear. Heart is normal size. No acute bony abnormality.  IMPRESSION: Small left pleural effusion with left base atelectasis.   Electronically Signed   By: Rolm Baptise M.D.   On: 10/12/2020 15:44    I have independently reviewed the above radiology studies  and reviewed the findings with the patient.   Recent Lab Findings: Lab Results  Component Value Date   WBC 7.1 11/10/2020   HGB 14.7 11/10/2020   HCT 43 11/10/2020   PLT 272 11/10/2020   GLUCOSE 126 (H) 10/06/2020   ALT 57 (A) 11/10/2020   AST 47 (A) 11/10/2020   NA 140 11/10/2020   K 5.0 11/10/2020   CL 104 11/10/2020   CREATININE 0.8 11/10/2020   BUN 24 (A) 11/10/2020   CO2 31 (A) 11/10/2020   TSH 0.527 11/10/2020   INR 1.0 09/16/2020      Assessment / Plan:    Plan to continue with wet-to-dry dressings on a daily basis to the abdominal  incision Patient will discussed with Dr. Courtney Heys care possible use of" depressant medication We will plan to see back for wound check in 3 weeks    Medication Changes: No orders of the defined types were placed in this encounter.     Grace Isaac MD      [address] [place] [address] Office [phone]     11/25/2020 4:43 PM

## 2020-11-29 ENCOUNTER — Inpatient Hospital Stay: Payer: BC Managed Care – PPO

## 2020-11-29 ENCOUNTER — Inpatient Hospital Stay: Payer: BC Managed Care – PPO | Admitting: Oncology

## 2020-11-30 ENCOUNTER — Inpatient Hospital Stay: Payer: BC Managed Care – PPO

## 2020-12-06 NOTE — Progress Notes (Signed)
[place]  [address] [place],  [place]  [address] [phone]  Clinic Day:  10/26/2020  Referring physician: Lillard Anes,*   HISTORY OF PRESENT ILLNESS:  The patient is a 55 y.o. 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  Furthermore, there was evidence of another esophageal adenocarcinoma in his midesophagus.  He completed neoadjuvant chemoradiation, which consisted of 3 cycles of FOLFOX chemotherapy.  As scans done afterwards showed no evidence of disease progression, he underwent a transhiatal total esophagectomy in November 2021.  Pathology from this surgery showed persistent adenocarcinoma which extended into the muscularis propria level of her stomach.  14 lymph nodes were removed, for which none contained cancer. Since his surgery, the patient has been doing well.  He feels he is recuperating well from his recent surgery.  He denies having any dysphagia, abdominal discomfort or other GI symptoms which concern him for either disease recurrence or postoperative complications.  He recently started nivolumab and received his first cycle 4 weeks ago. He reports tolerating this well and is here prior to a second cycle. He denies fever, chills, nausea or vomiting. He also denies issues with bowel or bladder. He denies shortness of breath, cough or chest pain. CBC and CMP today are unremarkable.   PHYSICAL EXAM:  Blood pressure 122/76, pulse 86, temperature 98 F (36.7 C), resp. rate 18, height 5\' 9"  (1.753 m), weight 180 lb 3.2 oz (81.7 kg), SpO2 97 %. Wt Readings from Last 3 Encounters:  12/07/20 180 lb 3.2 oz (81.7 kg)  11/25/20 182 lb (82.6 kg)  08/26/20 191 lb 3 oz (86.7 kg)   Body mass index is 26.61 kg/m. Performance status (ECOG): 1 - Symptomatic but completely ambulatory Physical Exam Constitutional:      General: He is not in acute distress.    Appearance: Normal appearance. He is normal  weight. He is not ill-appearing, toxic-appearing or diaphoretic.  HENT:     Head: Normocephalic and atraumatic.     Right Ear: Tympanic membrane normal.     Left Ear: Tympanic membrane normal.     Nose: Nose normal. No congestion or rhinorrhea.     Mouth/Throat:     Mouth: Mucous membranes are moist.     Pharynx: Oropharynx is clear. No oropharyngeal exudate or posterior oropharyngeal erythema.  Eyes:     General: No scleral icterus.       Right eye: No discharge.        Left eye: No discharge.     Extraocular Movements: Extraocular movements intact.     Conjunctiva/sclera: Conjunctivae normal.     Pupils: Pupils are equal, round, and reactive to light.  Neck:     Vascular: No carotid bruit.  Cardiovascular:     Rate and Rhythm: Normal rate and regular rhythm.     Heart sounds: No murmur heard. No friction rub. No gallop.   Pulmonary:     Effort: Pulmonary effort is normal. No respiratory distress.     Breath sounds: Normal breath sounds. No stridor. No wheezing, rhonchi or rales.  Chest:     Chest wall: No tenderness.  Abdominal:     General: Abdomen is flat. Bowel sounds are normal. There is no distension.     Palpations: There is no mass.     Tenderness: There is no abdominal tenderness. There is no right CVA tenderness, left CVA tenderness, guarding or rebound.     Hernia: No hernia is  present.  Musculoskeletal:        General: No swelling, tenderness, deformity or signs of injury. Normal range of motion.     Cervical back: Normal range of motion and neck supple. No rigidity or tenderness.     Right lower leg: No edema.     Left lower leg: No edema.  Lymphadenopathy:     Cervical: No cervical adenopathy.  Skin:    General: Skin is warm and dry.     Capillary Refill: Capillary refill takes less than 2 seconds.     Coloration: Skin is not jaundiced or pale.     Findings: No bruising, erythema, lesion or rash.  Neurological:     General: No focal deficit present.      Mental Status: He is alert and oriented to person, place, and time. Mental status is at baseline.     Cranial Nerves: No cranial nerve deficit.     Sensory: No sensory deficit.     Motor: No weakness.     Coordination: Coordination normal.     Gait: Gait normal.     Deep Tendon Reflexes: Reflexes normal.  Psychiatric:        Mood and Affect: Mood normal.        Behavior: Behavior normal.        Thought Content: Thought content normal.        Judgment: Judgment normal.     LABS:   CBC Latest Ref Rng & Units 12/07/2020 11/10/2020 10/26/2020  WBC - 9.5 7.1 7.4  Hemoglobin 13.5 - 17.5 15.6 14.7 14.6  Hematocrit 41 - 53 46 43 42  Platelets 150 - 399 342 272 262   CMP Latest Ref Rng & Units 12/07/2020 11/10/2020 10/26/2020  Glucose 70 - 99 mg/dL - - -  BUN 4 - 21 21 24(A) 20  Creatinine 0.6 - 1.3 0.8 0.8 0.7  Sodium 137 - 147 137 140 138  Potassium 3.4 - 5.3 4.7 5.0 4.3  Chloride 99 - 108 103 104 103  CO2 13 - 22 28(A) 31(A) 30(A)  Calcium 8.7 - 10.7 9.5 9.5 9.4  Total Protein 6.5 - 8.1 g/dL - - -  Total Bilirubin 0.3 - 1.2 mg/dL - - -  Alkaline Phos 25 - 125 136(A) 169(A) 176(A)  AST 14 - 40 29 47(A) 50(A)  ALT 10 - 40 41(A) 57(A) 82(A)     ASSESSMENT & PLAN:  Assessment/Plan:  A 55 y.o. 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  Although he had a remarkable response to his neoadjuvant chemoradiation, the fact that persistent disease was presents highlights his increased risk of disease recurrence over time.  To minimize the chances of this occurring, he was placed  on year-long adjuvant nivolumab immunotherapy. He tolerated his first cycle well. We will proceed with a second cycle this week. He will return to clinic in 4 weeks with repeat CBC, CMP and evaluation prior to a third cycle of nivolumab.   He verbalizes understanding of and agreement to the plans discussed today. He knows to call the office should any new questions or concerns arise.     Melodye Ped, NP

## 2020-12-07 ENCOUNTER — Inpatient Hospital Stay: Payer: BC Managed Care – PPO | Attending: Oncology

## 2020-12-07 ENCOUNTER — Other Ambulatory Visit: Payer: Self-pay

## 2020-12-07 ENCOUNTER — Telehealth: Payer: Self-pay | Admitting: Oncology

## 2020-12-07 ENCOUNTER — Inpatient Hospital Stay (INDEPENDENT_AMBULATORY_CARE_PROVIDER_SITE_OTHER): Payer: BC Managed Care – PPO | Admitting: Hematology and Oncology

## 2020-12-07 VITALS — BP 122/76 | HR 86 | Temp 98.0°F | Resp 18 | Ht 69.0 in | Wt 180.2 lb

## 2020-12-07 DIAGNOSIS — Z79899 Other long term (current) drug therapy: Secondary | ICD-10-CM | POA: Diagnosis not present

## 2020-12-07 DIAGNOSIS — C155 Malignant neoplasm of lower third of esophagus: Secondary | ICD-10-CM | POA: Diagnosis not present

## 2020-12-07 DIAGNOSIS — C159 Malignant neoplasm of esophagus, unspecified: Secondary | ICD-10-CM | POA: Diagnosis not present

## 2020-12-07 DIAGNOSIS — Z923 Personal history of irradiation: Secondary | ICD-10-CM | POA: Insufficient documentation

## 2020-12-07 DIAGNOSIS — Z9221 Personal history of antineoplastic chemotherapy: Secondary | ICD-10-CM | POA: Diagnosis not present

## 2020-12-07 LAB — HEPATIC FUNCTION PANEL
ALT: 41 — AB (ref 10–40)
AST: 29 (ref 14–40)
Alkaline Phosphatase: 136 — AB (ref 25–125)
Bilirubin, Total: 0.6

## 2020-12-07 LAB — CBC AND DIFFERENTIAL
HCT: 46 (ref 41–53)
Hemoglobin: 15.6 (ref 13.5–17.5)
Neutrophils Absolute: 6.75
Platelets: 342 (ref 150–399)
WBC: 9.5

## 2020-12-07 LAB — COMPREHENSIVE METABOLIC PANEL
Albumin: 4.1 (ref 3.5–5.0)
Calcium: 9.5 (ref 8.7–10.7)

## 2020-12-07 LAB — BASIC METABOLIC PANEL
BUN: 21 (ref 4–21)
CO2: 28 — AB (ref 13–22)
Chloride: 103 (ref 99–108)
Creatinine: 0.8 (ref 0.6–1.3)
Glucose: 105
Potassium: 4.7 (ref 3.4–5.3)
Sodium: 137 (ref 137–147)

## 2020-12-07 LAB — TSH: TSH: 0.414 u[IU]/mL (ref 0.350–4.500)

## 2020-12-07 LAB — CBC: RBC: 4.79 (ref 3.87–5.11)

## 2020-12-07 NOTE — Telephone Encounter (Signed)
Per 1/18 los next appt sched and given to patient 

## 2020-12-08 LAB — T4: T4, Total: 6 ug/dL (ref 4.5–12.0)

## 2020-12-09 ENCOUNTER — Other Ambulatory Visit: Payer: Self-pay

## 2020-12-09 ENCOUNTER — Inpatient Hospital Stay: Payer: BC Managed Care – PPO | Attending: Oncology

## 2020-12-09 DIAGNOSIS — C160 Malignant neoplasm of cardia: Secondary | ICD-10-CM | POA: Diagnosis not present

## 2020-12-09 DIAGNOSIS — C159 Malignant neoplasm of esophagus, unspecified: Secondary | ICD-10-CM

## 2020-12-09 DIAGNOSIS — Z5112 Encounter for antineoplastic immunotherapy: Secondary | ICD-10-CM | POA: Diagnosis not present

## 2020-12-09 MED ORDER — HEPARIN SOD (PORK) LOCK FLUSH 100 UNIT/ML IV SOLN
500.0000 [IU] | Freq: Once | INTRAVENOUS | Status: AC | PRN
  Administered 2020-12-09: 500 [IU]
  Filled 2020-12-09: qty 5

## 2020-12-09 MED ORDER — SODIUM CHLORIDE 0.9 % IV SOLN
Freq: Once | INTRAVENOUS | Status: AC
  Filled 2020-12-09: qty 250

## 2020-12-09 MED ORDER — SODIUM CHLORIDE 0.9 % IV SOLN
480.0000 mg | Freq: Once | INTRAVENOUS | Status: AC
  Administered 2020-12-09: 480 mg via INTRAVENOUS
  Filled 2020-12-09: qty 48

## 2020-12-09 NOTE — Patient Instructions (Signed)
[facility] [place] Discharge Instructions for Patients Receiving Chemotherapy  Today you received the following chemotherapy agents Nivolumab  To help prevent nausea and vomiting after your treatment, we encourage you to take your nausea medication.   If you develop nausea and vomiting that is not controlled by your nausea medication, call the clinic.   BELOW ARE SYMPTOMS THAT SHOULD BE REPORTED IMMEDIATELY:  *FEVER GREATER THAN 100.5 F  *CHILLS WITH OR WITHOUT FEVER  NAUSEA AND VOMITING THAT IS NOT CONTROLLED WITH YOUR NAUSEA MEDICATION  *UNUSUAL SHORTNESS OF BREATH  *UNUSUAL BRUISING OR BLEEDING  TENDERNESS IN MOUTH AND THROAT WITH OR WITHOUT PRESENCE OF ULCERS  *URINARY PROBLEMS  *BOWEL PROBLEMS  UNUSUAL RASH Items with * indicate a potential emergency and should be followed up as soon as possible.  Feel free to call the clinic should you have any questions or concerns at The clinic phone number is [phone].  Please show the CHEMO ALERT CARD at check-in to the Emergency Department and triage nurse.   

## 2020-12-23 ENCOUNTER — Other Ambulatory Visit: Payer: Self-pay

## 2020-12-23 ENCOUNTER — Ambulatory Visit: Payer: BC Managed Care – PPO | Admitting: Cardiothoracic Surgery

## 2020-12-23 VITALS — BP 123/84 | HR 92 | Resp 20 | Ht 69.0 in | Wt 187.0 lb

## 2020-12-23 DIAGNOSIS — Z5189 Encounter for other specified aftercare: Secondary | ICD-10-CM

## 2020-12-23 DIAGNOSIS — Z9889 Other specified postprocedural states: Secondary | ICD-10-CM

## 2020-12-23 DIAGNOSIS — Z9049 Acquired absence of other specified parts of digestive tract: Secondary | ICD-10-CM

## 2020-12-23 NOTE — Progress Notes (Signed)
[address]       [place] [address]             [phone]      Jerrod Kirk [facility] [place] Medical Record #[id number] Date of Birth: 1966-03-07  Referring: Lillard Anes,* Primary Care: Lillard Anes, MD Primary Cardiologist: No primary care provider on file. Medical Oncology: Dr Argentina Donovan  Chief Complaint:   POST OP FOLLOW UP OPERATIVE REPORT DATE OF PROCEDURE:  09/20/2020 PREOPERATIVE DIAGNOSIS:  Adenocarcinoma of the distal esophagus, gastroesophageal junction. POSTOPERATIVE DIAGNOSIS:  Adenocarcinoma of the distal esophagus, gastroesophageal junction. SURGICAL PROCEDURE:  Video bronchoscopy, transhiatal total esophagectomy with cervical esophagogastrotomy, pyloroplasty, placement of feeding jejunostomy tube, removal of PEG tube.  Cancer Staging Esophageal carcinoma [facility]) Staging form: Esophagus - Adenocarcinoma, AJCC 8th Edition - Clinical stage from 09/02/2020: Stage Unknown (cTX, cN2, cM0, G3) - Signed by Grace Isaac, MD on 09/02/2020 - Pathologic stage from 09/23/2020: Stage I (ypT2, pN0, cM0, G2) - Signed by Grace Isaac, MD on 09/23/2020  History of Present Illness:     Patient returns to the office for follow up  postop visit after e following transhiatal total esophagectomy on November 1.   Postoperative wound probed and separated-he is currently been doing wet-to-dry saline dressings  Also notes that he went to the Spider-Man movie and while at the movie his jejunostomy tube fell out, since he has been taking a p.o. diet including solid food without difficulty we have left the jejunostomy tube out  Since last seen rather than the incision contracting with actually separated further-does not appear to be infected    Past Medical History:  Diagnosis Date  . Bowel obstruction ([place])    06/21/2017 reported- "years ago"  . Cancer ([place])    basal cell face  . GERD (gastroesophageal reflux disease)   . History of  kidney stones    x 2     Social History   Tobacco Use  Smoking Status Former Smoker  . Years: 0.20  Smokeless Tobacco Former Systems developer  Tobacco Comment   quit in [place] History   Substance and Sexual Activity  Alcohol Use No   Comment: in high school- not since     Allergies  Allergen Reactions  . Penicillins Anaphylaxis  . Prednisone Anxiety    Mood swings    Current Outpatient Medications  Medication Sig Dispense Refill  . acetaminophen (TYLENOL) 325 MG tablet Take 2 tablets (650 mg total) by mouth every 6 (six) hours as needed.    Marland Kitchen ascorbic acid (VITAMIN C) 500 MG tablet Take 500 mg by mouth daily.    . cholecalciferol (VITAMIN D3) 25 MCG (1000 UNIT) tablet Take 1,000 Units by mouth daily.    Marland Kitchen levofloxacin (LEVAQUIN) 750 MG tablet Take 1 tablet (750 mg total) by mouth daily. 7 tablet 0  . lidocaine (LIDODERM) 5 % USE 1 PATCH EXTERNALLY ONCE DAILY AS NEEDED FOR PAIN. APPLY TOPICALLY TO INTACT SKIN OVER AREA OF GREATEST PAIN. YOU MAY CUT TO SIZE MAXIMUM    . metoprolol tartrate (LOPRESSOR) 25 MG tablet Take 0.5 tablets (12.5 mg total) by mouth 2 (two) times daily. 30 tablet 1  . omeprazole (PRILOSEC) 40 MG capsule Take 40 mg by mouth every morning.    . ondansetron (ZOFRAN) 4 MG tablet Take 1 tablet (4 mg total) by mouth every 4 (four) hours as needed for nausea. 90 tablet 3  . oxyCODONE-acetaminophen (PERCOCET)  10-325 MG tablet Take 1 tablet by mouth every 4 (four) hours as needed for pain.    Marland Kitchen prochlorperazine (COMPAZINE) 10 MG tablet Take 1 tablet (10 mg total) by mouth every 6 (six) hours as needed for nausea or vomiting. 90 tablet 3  . zinc gluconate 50 MG tablet Take 50 mg by mouth daily.     No current facility-administered medications for this visit.       Physical Exam: BP 123/84 (BP Location: Left Arm, Patient Position: Sitting)   Pulse 92   Resp 20   Ht 5\' 9"  (1.753 m)   Wt 187 lb (84.8 kg)   SpO2 97% Comment: RA with mask on  BMI 27.62 kg/m   General appearance: alert, cooperative and appears stated age Neck: no adenopathy, no carotid bruit, no JVD, supple, symmetrical, trachea midline and thyroid not enlarged, symmetric, no tenderness/mass/nodules Cardio: regular rate and rhythm, S1, S2 normal, no murmur, click, rub or gallop GI: soft, non-tender; bowel sounds normal; no masses,  no organomegaly Extremities: extremities normal, atraumatic, no cyanosis or edema and Homans sign is negative, no sign of DVT Neurologic: Grossly normal J-tube sites completely healed over, the wound is clean but rather than contracting is actually separated further       Diagnostic Studies & Laboratory data:     Recent Radiology Findings:  Narrative & Impression  CLINICAL DATA:  Esophageal carcinoma  EXAM: CHEST - 2 VIEW  COMPARISON:  09/29/2020  FINDINGS: Right Port-A-Cath remains in place, unchanged. Left basilar subsegmental atelectasis with small left effusion. Right lung clear. Heart is normal size. No acute bony abnormality.  IMPRESSION: Small left pleural effusion with left base atelectasis.   Electronically Signed   By: Rolm Baptise M.D.   On: 10/12/2020 15:44    I have independently reviewed the above radiology studies  and reviewed the findings with the patient.   Recent Lab Findings: Lab Results  Component Value Date   WBC 9.5 12/07/2020   HGB 15.6 12/07/2020   HCT 46 12/07/2020   PLT 342 12/07/2020   GLUCOSE 126 (H) 10/06/2020   ALT 41 (A) 12/07/2020   AST 29 12/07/2020   NA 137 12/07/2020   K 4.7 12/07/2020   CL 103 12/07/2020   CREATININE 0.8 12/07/2020   BUN 21 12/07/2020   CO2 28 (A) 12/07/2020   TSH 0.414 12/07/2020   INR 1.0 09/16/2020      Assessment / Plan:    Plan to continue with wet-to-dry dressings on a daily basis to the abdominal incision I have attached photograph of the current incision to the chart documenting the current status.  I will ask Dr. Marla Roe to review and see  the patient and offer suggestions for improve wound healing and closure.  Have made follow-up appointment for the patient in 3 weeks but will modify that depending on recommendations from Dr. Marla Roe.    Medication Changes: No orders of the defined types were placed in this encounter.     Grace Isaac MD      [address] [place] [address] Office [phone]     12/23/2020 2:51 PM

## 2020-12-28 ENCOUNTER — Ambulatory Visit: Payer: BC Managed Care – PPO | Admitting: Oncology

## 2020-12-28 ENCOUNTER — Other Ambulatory Visit: Payer: BC Managed Care – PPO

## 2021-01-04 ENCOUNTER — Inpatient Hospital Stay: Payer: BC Managed Care – PPO | Attending: Oncology

## 2021-01-04 ENCOUNTER — Other Ambulatory Visit: Payer: Self-pay | Admitting: Hematology and Oncology

## 2021-01-04 ENCOUNTER — Telehealth: Payer: Self-pay | Admitting: Oncology

## 2021-01-04 ENCOUNTER — Other Ambulatory Visit: Payer: Self-pay | Admitting: Oncology

## 2021-01-04 ENCOUNTER — Inpatient Hospital Stay (INDEPENDENT_AMBULATORY_CARE_PROVIDER_SITE_OTHER): Payer: BC Managed Care – PPO | Admitting: Oncology

## 2021-01-04 ENCOUNTER — Encounter: Payer: Self-pay | Admitting: Oncology

## 2021-01-04 ENCOUNTER — Other Ambulatory Visit: Payer: Self-pay

## 2021-01-04 VITALS — BP 115/80 | HR 90 | Temp 97.8°F | Resp 18 | Ht 69.0 in | Wt 184.6 lb

## 2021-01-04 DIAGNOSIS — Z79899 Other long term (current) drug therapy: Secondary | ICD-10-CM | POA: Diagnosis not present

## 2021-01-04 DIAGNOSIS — Z923 Personal history of irradiation: Secondary | ICD-10-CM | POA: Insufficient documentation

## 2021-01-04 DIAGNOSIS — C159 Malignant neoplasm of esophagus, unspecified: Secondary | ICD-10-CM

## 2021-01-04 DIAGNOSIS — Z9221 Personal history of antineoplastic chemotherapy: Secondary | ICD-10-CM | POA: Diagnosis not present

## 2021-01-04 DIAGNOSIS — C158 Malignant neoplasm of overlapping sites of esophagus: Secondary | ICD-10-CM | POA: Diagnosis not present

## 2021-01-04 DIAGNOSIS — Z5112 Encounter for antineoplastic immunotherapy: Secondary | ICD-10-CM | POA: Insufficient documentation

## 2021-01-04 LAB — CBC AND DIFFERENTIAL
HCT: 33 — AB (ref 41–53)
Hemoglobin: 16 (ref 13.5–17.5)
Neutrophils Absolute: 4.42
Platelets: 244 (ref 150–399)
WBC: 6.5

## 2021-01-04 LAB — BASIC METABOLIC PANEL
BUN: 22 — AB (ref 4–21)
CO2: 29 — AB (ref 13–22)
Chloride: 103 (ref 99–108)
Creatinine: 0.9 (ref 0.6–1.3)
Glucose: 119
Potassium: 4.6 (ref 3.4–5.3)
Sodium: 137 (ref 137–147)

## 2021-01-04 LAB — CBC
MCV: 95 — AB (ref 80–94)
RBC: 4.88 (ref 3.87–5.11)

## 2021-01-04 LAB — TSH: TSH: 0.708 u[IU]/mL (ref 0.350–4.500)

## 2021-01-04 LAB — HEPATIC FUNCTION PANEL
ALT: 34 (ref 10–40)
AST: 29 (ref 14–40)
Alkaline Phosphatase: 127 — AB (ref 25–125)
Bilirubin, Total: 0.7

## 2021-01-04 LAB — COMPREHENSIVE METABOLIC PANEL
Albumin: 4.1 (ref 3.5–5.0)
Calcium: 8.9 (ref 8.7–10.7)

## 2021-01-04 NOTE — Telephone Encounter (Signed)
Per 2/15 LOS, patient scheduled for March Appt's.  Gave patient Appt Summary

## 2021-01-04 NOTE — Progress Notes (Incomplete)
[facility]  [address] [place],    [address] [phone]  Clinic Day:  10/26/2020  Referring physician: No ref. provider found   HISTORY OF PRESENT ILLNESS:  The patient is a 55 y.o. 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  Furthermore, there was evidence of another esophageal adenocarcinoma in his midesophagus.  He completed neoadjuvant chemoradiation, which consisted of 3 cycles of FOLFOX chemotherapy.  As scans done afterwards showed no evidence of disease progression, he underwent a transhiatal total esophagectomy in November 2021.  Pathology from this surgery showed persistent adenocarcinoma which extended into the muscularis propria level of her stomach.  14 lymph nodes were removed, for which none contained cancer.  He comes in today to go over his surgical pathology.  Since his surgery, the patient has been doing well.  He feels he is recuperating well from his recent surgery.  He denies having any dysphagia, abdominal discomfort or other GI symptoms which concern him for either disease recurrence or postoperative complications.    PHYSICAL EXAM:  There were no vitals taken for this visit. Wt Readings from Last 3 Encounters:  01/04/21 184 lb 9.6 oz (83.7 kg)  12/23/20 187 lb (84.8 kg)  12/07/20 180 lb 3.2 oz (81.7 kg)   There is no height or weight on file to calculate BMI. Performance status (ECOG): 1 - Symptomatic but completely ambulatory Physical Exam Constitutional:      Appearance: Normal appearance. He is not ill-appearing.  HENT:     Mouth/Throat:     Mouth: Mucous membranes are moist.     Pharynx: Oropharynx is clear. No oropharyngeal exudate or posterior oropharyngeal erythema.  Cardiovascular:     Rate and Rhythm: Normal rate and regular rhythm.     Heart sounds: No murmur heard. No friction rub. No gallop.   Pulmonary:     Effort: Pulmonary effort is normal. No respiratory  distress.     Breath sounds: Normal breath sounds. No wheezing, rhonchi or rales.  Chest:  Breasts:     Right: No axillary adenopathy or supraclavicular adenopathy.     Left: No axillary adenopathy or supraclavicular adenopathy.    Abdominal:     General: Bowel sounds are normal. There is no distension.     Palpations: Abdomen is soft. There is no mass.     Tenderness: There is no abdominal tenderness.  Musculoskeletal:        General: No swelling.     Right lower leg: No edema.     Left lower leg: No edema.  Lymphadenopathy:     Cervical: No cervical adenopathy.     Upper Body:     Right upper body: No supraclavicular or axillary adenopathy.     Left upper body: No supraclavicular or axillary adenopathy.     Lower Body: No right inguinal adenopathy. No left inguinal adenopathy.  Skin:    General: Skin is warm.     Coloration: Skin is not jaundiced.     Findings: No lesion or rash.  Neurological:     General: No focal deficit present.     Mental Status: He is alert and oriented to person, place, and time. Mental status is at baseline.     Cranial Nerves: Cranial nerves are intact.  Psychiatric:        Mood and Affect: Mood normal.        Behavior: Behavior normal.        Thought  Content: Thought content normal.     LABS:   CBC Latest Ref Rng & Units 01/04/2021 12/07/2020 11/10/2020  WBC - 6.5 9.5 7.1  Hemoglobin 13.5 - 17.5 16.0 15.6 14.7  Hematocrit 41 - 53 33(A) 46 43  Platelets 150 - 399 244 342 272   CMP Latest Ref Rng & Units 01/04/2021 12/07/2020 11/10/2020  Glucose 70 - 99 mg/dL - - -  BUN 4 - 21 22(A) 21 24(A)  Creatinine 0.6 - 1.3 0.9 0.8 0.8  Sodium 137 - 147 137 137 140  Potassium 3.4 - 5.3 4.6 4.7 5.0  Chloride 99 - 108 103 103 104  CO2 13 - 22 29(A) 28(A) 31(A)  Calcium 8.7 - 10.7 8.9 9.5 9.5  Total Protein 6.5 - 8.1 g/dL - - -  Total Bilirubin 0.3 - 1.2 mg/dL - - -  Alkaline Phos 25 - 125 127(A) 136(A) 169(A)  AST 14 - 40 29 29 47(A)  ALT 10 - 40 34  41(A) 57(A)    PATHOLOGY:  ESOPHAGOGASTRECTOMY:  - Adenocarcinoma at the gastroesophageal junction associated with  ulceration and fibrosis.  - Carcinoma extends into muscularis propria.  - Margins not involved.  - Fourteen lymph nodes with no metastatic carcinoma (0/14).  - One lymph node has a 0.4 cm nodule of fibrosis and histiocytes.  - See oncology table and comment.   ONCOLOGY TABLE:  ESOPHAGUS:  Procedure: Esophagogastrectomy.  Tumor Site: Gastroesophageal junction.  Relationship of Tumor to Esophagogastric Junction: Tumor situated at  esophagogastric junction.  Tumor Size: 6 x 2 cm.  Histologic Type: Adenocarcinoma.  Histologic Grade: Moderately differentiated.  Tumor Extension: Into muscularis propria, see comment.  Margins: Not involved by tumor.  Treatment Effect: Fibrosis and inflammation consistent with treatment  effect.  Lymphovascular Invasion: Not identified.  Regional Lymph Nodes:    Number of Lymph Nodes Involved: 0    Number of Lymph Nodes Examined: 14, 1 with 0.4 cm fibrous nodule.  Pathologic Stage Classification (pTNM, AJCC 8th Edition): ypT2, ypN0  Ancillary Studies: Can be performed if requested.  Representative Tumor Block: B3, B5, B6, B7, B8 and B9  Comment(s): There are large areas of fibrosis and focal ulceration  consistent with treatment effect within which are scattered foci of  adenocarcinoma. The tumor is situated at the gastroesophageal junction  with involvement of the distal esophagus and proximal stomach. The  carcinoma extends into the outer portions of the muscularis propria but  involvement of the adjacent adventitial connective tissue is not  identified. One of the fourteen lymph nodes has a 0.4 cm nodule of  fibrosis and foamy histiocytes.  ([id number])    ASSESSMENT & PLAN:  Assessment/Plan:  A 55 y.o. 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  Although he had a remarkable response to  his neoadjuvant chemoradiation, the fact that persistent disease was presents highlights his increased risk of disease recurrence over time.  To minimize the chances of this occurring, I will place him on year-long adjuvant nivolumab immunotherapy.  It will be given at 480 mg once every 4 weeks.  The patient was made aware of the potential side effects that can go along with immunotherapy, including severe shortness of breath and diarrhea.  His first cycle of nivolumab will be given on December 14th.  I will see him back 4 weeks later before he heads into his 2nd cycle of maintenance nivolumab immunotherapy.  The patient understands all the plans discussed today and is in agreement  with them.      Dequincy Macarthur Critchley, MD

## 2021-01-05 LAB — T4: T4, Total: 7.2 ug/dL (ref 4.5–12.0)

## 2021-01-06 ENCOUNTER — Other Ambulatory Visit: Payer: Self-pay

## 2021-01-06 ENCOUNTER — Inpatient Hospital Stay: Payer: BC Managed Care – PPO

## 2021-01-06 VITALS — BP 121/67 | HR 88 | Temp 98.3°F | Resp 18 | Ht 69.0 in | Wt 184.4 lb

## 2021-01-06 DIAGNOSIS — Z5112 Encounter for antineoplastic immunotherapy: Secondary | ICD-10-CM | POA: Diagnosis not present

## 2021-01-06 DIAGNOSIS — Z79899 Other long term (current) drug therapy: Secondary | ICD-10-CM | POA: Diagnosis not present

## 2021-01-06 DIAGNOSIS — C159 Malignant neoplasm of esophagus, unspecified: Secondary | ICD-10-CM

## 2021-01-06 DIAGNOSIS — Z9221 Personal history of antineoplastic chemotherapy: Secondary | ICD-10-CM | POA: Diagnosis not present

## 2021-01-06 DIAGNOSIS — C158 Malignant neoplasm of overlapping sites of esophagus: Secondary | ICD-10-CM | POA: Diagnosis not present

## 2021-01-06 DIAGNOSIS — Z923 Personal history of irradiation: Secondary | ICD-10-CM | POA: Diagnosis not present

## 2021-01-06 MED ORDER — SODIUM CHLORIDE 0.9 % IV SOLN
480.0000 mg | Freq: Once | INTRAVENOUS | Status: AC
  Administered 2021-01-06: 480 mg via INTRAVENOUS
  Filled 2021-01-06: qty 48

## 2021-01-06 MED ORDER — SODIUM CHLORIDE 0.9 % IV SOLN
Freq: Once | INTRAVENOUS | Status: AC
  Filled 2021-01-06: qty 250

## 2021-01-06 MED ORDER — HEPARIN SOD (PORK) LOCK FLUSH 100 UNIT/ML IV SOLN
500.0000 [IU] | Freq: Once | INTRAVENOUS | Status: AC | PRN
  Administered 2021-01-06: 500 [IU]
  Filled 2021-01-06: qty 5

## 2021-01-06 NOTE — Patient Instructions (Signed)
Nivolumab injection What is this medicine? NIVOLUMAB (nye VOL ue mab) is a monoclonal antibody. It treats certain types of cancer. Some of the cancers treated are colon cancer, head and neck cancer, Hodgkin lymphoma, lung cancer, and melanoma. This medicine may be used for other purposes; ask your health care provider or pharmacist if you have questions. COMMON BRAND NAME(S): Opdivo What should I tell my health care provider before I take this medicine? They need to know if you have any of these conditions:  autoimmune diseases like Crohn's disease, ulcerative colitis, or lupus  have had or planning to have an allogeneic stem cell transplant (uses someone else's stem cells)  history of chest radiation  history of organ transplant  nervous system problems like myasthenia gravis or Guillain-Barre syndrome  an unusual or allergic reaction to nivolumab, other medicines, foods, dyes, or preservatives  pregnant or trying to get pregnant  breast-feeding How should I use this medicine? This medicine is for infusion into a vein. It is given by a health care professional in a hospital or clinic setting. A special MedGuide will be given to you before each treatment. Be sure to read this information carefully each time. Talk to your pediatrician regarding the use of this medicine in children. While this drug may be prescribed for children as young as 12 years for selected conditions, precautions do apply. Overdosage: If you think you have taken too much of this medicine contact a poison control center or emergency room at once. NOTE: This medicine is only for you. Do not share this medicine with others. What if I miss a dose? It is important not to miss your dose. Call your doctor or health care professional if you are unable to keep an appointment. What may interact with this medicine? Interactions have not been studied. This list may not describe all possible interactions. Give your health  care provider a list of all the medicines, herbs, non-prescription drugs, or dietary supplements you use. Also tell them if you smoke, drink alcohol, or use illegal drugs. Some items may interact with your medicine. What should I watch for while using this medicine? This drug may make you feel generally unwell. Continue your course of treatment even though you feel ill unless your doctor tells you to stop. You may need blood work done while you are taking this medicine. Do not become pregnant while taking this medicine or for 5 months after stopping it. Women should inform their doctor if they wish to become pregnant or think they might be pregnant. There is a potential for serious side effects to an unborn child. Talk to your health care professional or pharmacist for more information. Do not breast-feed an infant while taking this medicine or for 5 months after stopping it. What side effects may I notice from receiving this medicine? Side effects that you should report to your doctor or health care professional as soon as possible:  allergic reactions like skin rash, itching or hives, swelling of the face, lips, or tongue  breathing problems  blood in the urine  bloody or watery diarrhea or black, tarry stools  changes in emotions or moods  changes in vision  chest pain  cough  dizziness  feeling faint or lightheaded, falls  fever, chills  headache with fever, neck stiffness, confusion, loss of memory, sensitivity to light, hallucination, loss of contact with reality, or seizures  joint pain  mouth sores  redness, blistering, peeling or loosening of the skin, including inside the   mouth  severe muscle pain or weakness  signs and symptoms of high blood sugar such as dizziness; dry mouth; dry skin; fruity breath; nausea; stomach pain; increased hunger or thirst; increased urination  signs and symptoms of kidney injury like trouble passing urine or change in the amount of  urine  signs and symptoms of liver injury like dark yellow or brown urine; general ill feeling or flu-like symptoms; light-colored stools; loss of appetite; nausea; right upper belly pain; unusually weak or tired; yellowing of the eyes or skin  swelling of the ankles, feet, hands  trouble passing urine or change in the amount of urine  unusually weak or tired  weight gain or loss Side effects that usually do not require medical attention (report to your doctor or health care professional if they continue or are bothersome):  bone pain  constipation  decreased appetite  diarrhea  muscle pain  nausea, vomiting  tiredness This list may not describe all possible side effects. Call your doctor for medical advice about side effects. You may report side effects to FDA at 1-800-FDA-1088. Where should I keep my medicine? This drug is given in a hospital or clinic and will not be stored at home. NOTE: This sheet is a summary. It may not cover all possible information. If you have questions about this medicine, talk to your doctor, pharmacist, or health care provider.  2021 Elsevier/Gold Standard (2020-03-10 10:08:25)  

## 2021-01-07 ENCOUNTER — Other Ambulatory Visit: Payer: Self-pay

## 2021-01-07 ENCOUNTER — Encounter: Payer: Self-pay | Admitting: Plastic Surgery

## 2021-01-07 ENCOUNTER — Ambulatory Visit: Payer: BC Managed Care – PPO | Admitting: Plastic Surgery

## 2021-01-07 DIAGNOSIS — S31109A Unspecified open wound of abdominal wall, unspecified quadrant without penetration into peritoneal cavity, initial encounter: Secondary | ICD-10-CM | POA: Diagnosis not present

## 2021-01-07 NOTE — Progress Notes (Signed)
Patient ID: Thomas Gonzalez, male    DOB: December 31, 1965, 55 y.o.   MRN: [id number]   Chief Complaint  Patient presents with  . Skin Problem    The patient is a 55 year old gentleman here with his wife for evaluation of his abdomen.  In November 2021 the patient underwent transhiatal total esophagectomy with cervical esophagogastrostomy and pyloroplasty.  This was done for adenocarcinoma of the distal esophagus.  He has had a resulting abdominal wound since.  It is 7 x 15 cm and full-thickness.  It does not appear to be active at the site or in the periwound area.  It is a little bit bloody with changing the dressing as expected.  There does appear to be some granulation tissue.  He has been doing wet-to-dry dressings daily and his wife has been a big help.  He is 5 feet 9 inches tall weighs 182 pounds.   Review of Systems  Constitutional: Negative.  Negative for activity change and appetite change.  HENT: Negative.   Eyes: Negative.   Respiratory: Negative.  Negative for chest tightness and shortness of breath.   Cardiovascular: Negative for leg swelling.  Gastrointestinal: Positive for abdominal distention.  Endocrine: Negative.   Genitourinary: Negative.   Musculoskeletal: Negative.   Skin: Positive for color change and wound.    Past Medical History:  Diagnosis Date  . Bowel obstruction ([place])    06/21/2017 reported- "years ago"  . Cancer ([place])    basal cell face  . GERD (gastroesophageal reflux disease)   . History of kidney stones    x 2    Past Surgical History:  Procedure Laterality Date  . APPENDECTOMY  2019  . COMPLETE ESOPHAGECTOMY N/A 09/20/2020   Procedure: Transhiatal total ESOPHAGECTOMY with Pyloroplasty;  Surgeon: Grace Isaac, MD;  Location: [facility] OR;  Service: Thoracic;  Laterality: N/A;  . HERNIA REPAIR Bilateral 2007,2010,2020   Inguinial   . JEJUNOSTOMY N/A 09/20/2020   Procedure: Shanon Rosser;  Surgeon: Grace Isaac, MD;  Location: [facility];   Service: Thoracic;  Laterality: N/A;  . KNEE ARTHROSCOPY Left 06/27/2017   Procedure: ARTHROSCOPY KNEE;  Surgeon: Dorna Leitz, MD;  Location: [facility];  Service: Orthopedics;  Laterality: Left;  . REMOVAL OF GASTROSTOMY TUBE  09/20/2020   Procedure: REMOVAL OF GASTROSTOMY TUBE;  Surgeon: Grace Isaac, MD;  Location: [facility];  Service: Thoracic;;  . VIDEO BRONCHOSCOPY N/A 09/20/2020   Procedure: VIDEO BRONCHOSCOPY;  Surgeon: Grace Isaac, MD;  Location: [facility] OR;  Service: Thoracic;  Laterality: N/A;      Current Outpatient Medications:  .  acetaminophen (TYLENOL) 325 MG tablet, Take 2 tablets (650 mg total) by mouth every 6 (six) hours as needed., Disp: , Rfl:  .  ascorbic acid (VITAMIN C) 500 MG tablet, Take 500 mg by mouth daily., Disp: , Rfl:  .  cholecalciferol (VITAMIN D3) 25 MCG (1000 UNIT) tablet, Take 1,000 Units by mouth daily., Disp: , Rfl:  .  levofloxacin (LEVAQUIN) 750 MG tablet, Take 1 tablet (750 mg total) by mouth daily., Disp: 7 tablet, Rfl: 0 .  lidocaine (LIDODERM) 5 %, USE 1 PATCH EXTERNALLY ONCE DAILY AS NEEDED FOR PAIN. APPLY TOPICALLY TO INTACT SKIN OVER AREA OF GREATEST PAIN. YOU MAY CUT TO SIZE MAXIMUM, Disp: , Rfl:  .  metoprolol tartrate (LOPRESSOR) 25 MG tablet, Take 0.5 tablets (12.5 mg total) by mouth 2 (two) times daily., Disp: 30 tablet, Rfl: 1 .  omeprazole (PRILOSEC) 40 MG capsule,  Take 40 mg by mouth every morning., Disp: , Rfl:  .  ondansetron (ZOFRAN) 4 MG tablet, Take 1 tablet (4 mg total) by mouth every 4 (four) hours as needed for nausea., Disp: 90 tablet, Rfl: 3 .  oxyCODONE-acetaminophen (PERCOCET) 10-325 MG tablet, Take 1 tablet by mouth every 4 (four) hours as needed for pain., Disp: , Rfl:  .  prochlorperazine (COMPAZINE) 10 MG tablet, Take 1 tablet (10 mg total) by mouth every 6 (six) hours as needed for nausea or vomiting., Disp: 90 tablet, Rfl: 3 .  zinc gluconate 50 MG tablet, Take 50 mg by mouth daily., Disp: , Rfl:    Objective:   Vitals:    01/07/21 1032  BP: 115/80  Pulse: 88  Temp: 98.6 F (37 C)  SpO2: 99%    Physical Exam Vitals and nursing note reviewed.  Constitutional:      Appearance: Normal appearance.  HENT:     Head: Normocephalic and atraumatic.  Cardiovascular:     Rate and Rhythm: Normal rate.     Pulses: Normal pulses.  Pulmonary:     Effort: Pulmonary effort is normal. No respiratory distress.     Breath sounds: No wheezing.  Abdominal:     General: Abdomen is flat. There is no distension.    Skin:    General: Skin is warm.     Coloration: Skin is not jaundiced.     Findings: Lesion present. No bruising.  Neurological:     General: No focal deficit present.     Mental Status: He is alert and oriented to person, place, and time.  Psychiatric:        Mood and Affect: Mood normal.        Behavior: Behavior normal.        Thought Content: Thought content normal.     Assessment & Plan:  Open wound of abdominal wall, initial encounter  Recommend debridement with placement of extracellular matrix for improved healing.  Patient and wife are in agreement in the meantime we will send in a request to [facility] for endoform.  He should use the endoform K-Y jelly with a 4 x 4 gauze and a Mepilex border dressing every 1 to 2 days.  He can shower in between.  He also needs to really work on increasing his protein intake.  Pictures were obtained of the patient and placed in the chart with the patient's or guardian's permission.   [place], DO

## 2021-01-10 ENCOUNTER — Telehealth: Payer: Self-pay

## 2021-01-10 DIAGNOSIS — S31109A Unspecified open wound of abdominal wall, unspecified quadrant without penetration into peritoneal cavity, initial encounter: Secondary | ICD-10-CM | POA: Diagnosis not present

## 2021-01-10 NOTE — Telephone Encounter (Signed)
Orders per Dr. Marla Roe for wound/dressing care as follows: 1) Endoform in wound bed every 1-2 days Everyday: KY jelly 4x4 gauze Mepilex border dressing Copy of this given to pt/ copy scanned  into chart & faxed to [facility]

## 2021-01-10 NOTE — Telephone Encounter (Signed)
Fax received from [facility] as follows: Service has been provided- no further action needed.

## 2021-01-11 NOTE — Progress Notes (Signed)
[facility]  [address] [place],  [place]  [address] [phone]  Clinic Day:  2/15//2022  Referring physician: Lillard Anes,*   HISTORY OF PRESENT ILLNESS:  The patient is a 54 y.o. 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  Furthermore, there was evidence of another esophageal adenocarcinoma in his midesophagus.  He completed neoadjuvant chemoradiation, which consisted of 3 cycles of FOLFOX chemotherapy.  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  Pathology from this surgery showed persistent adenocarcinoma which extended into the muscularis propria level of his stomach.  Based upon this, he is undergoing adjuvant nivolumab immunotherapy for 1 year.  He comes in today to be evaluated before heading into his 3rd cycle of nivolumab.  Since his last visit, the patient has been doing well.  He continues to tolerate his nivolumab immunotherapy very well without having any significant side effects.  He denies having any dysphagia, abdominal discomfort or other GI symptoms which concern him for early disease recurrence.    PHYSICAL EXAM:  Blood pressure 115/80, pulse 90, temperature 97.8 F (36.6 C), temperature source Oral, resp. rate 18, height 5\' 9"  (1.753 m), weight 184 lb 9.6 oz (83.7 kg), SpO2 99 %. Wt Readings from Last 3 Encounters:  01/07/21 182 lb (82.6 kg)  01/06/21 184 lb 6 oz (83.6 kg)  01/04/21 184 lb 9.6 oz (83.7 kg)   Body mass index is 27.26 kg/m. Performance status (ECOG): 1 - Symptomatic but completely ambulatory Physical Exam Constitutional:      Appearance: Normal appearance. He is not ill-appearing.  HENT:     Mouth/Throat:     Mouth: Mucous membranes are moist.     Pharynx: Oropharynx is clear. No oropharyngeal exudate or posterior oropharyngeal erythema.  Cardiovascular:     Rate and Rhythm: Normal rate and regular rhythm.     Heart sounds: No  murmur heard. No friction rub. No gallop.   Pulmonary:     Effort: Pulmonary effort is normal. No respiratory distress.     Breath sounds: Normal breath sounds. No wheezing, rhonchi or rales.  Chest:  Breasts:     Right: No axillary adenopathy or supraclavicular adenopathy.     Left: No axillary adenopathy or supraclavicular adenopathy.    Abdominal:     General: Bowel sounds are normal. There is no distension.     Palpations: Abdomen is soft. There is no mass.     Tenderness: There is no abdominal tenderness.  Musculoskeletal:        General: No swelling.     Right lower leg: No edema.     Left lower leg: No edema.  Lymphadenopathy:     Cervical: No cervical adenopathy.     Upper Body:     Right upper body: No supraclavicular or axillary adenopathy.     Left upper body: No supraclavicular or axillary adenopathy.     Lower Body: No right inguinal adenopathy. No left inguinal adenopathy.  Skin:    General: Skin is warm.     Coloration: Skin is not jaundiced.     Findings: No lesion or rash.  Neurological:     General: No focal deficit present.     Mental Status: He is alert and oriented to person, place, and time. Mental status is at baseline.     Cranial Nerves: Cranial nerves are intact.  Psychiatric:        Mood and Affect: Mood  normal.        Behavior: Behavior normal.        Thought Content: Thought content normal.     LABS:    CMP Latest Ref Rng & Units 01/04/2021 12/07/2020 11/10/2020  Glucose 70 - 99 mg/dL - - -  BUN 4 - 21 22(A) 21 24(A)  Creatinine 0.6 - 1.3 0.9 0.8 0.8  Sodium 137 - 147 137 137 140  Potassium 3.4 - 5.3 4.6 4.7 5.0  Chloride 99 - 108 103 103 104  CO2 13 - 22 29(A) 28(A) 31(A)  Calcium 8.7 - 10.7 8.9 9.5 9.5  Total Protein 6.5 - 8.1 g/dL - - -  Total Bilirubin 0.3 - 1.2 mg/dL - - -  Alkaline Phos 25 - 125 127(A) 136(A) 169(A)  AST 14 - 40 29 29 47(A)  ALT 10 - 40 34 41(A) 57(A)    ASSESSMENT & PLAN:  Assessment/Plan:  A 55 y.o. male  with stage IVA (T3 N2 M0) adenocarcinoma of his gastroesophageal junction.  He will proceed with his 3rd cycle of adjuvant nivolumab immunotherapy this week, which is being given at 480 mg once every 4 weeks.  Clinically, the patient appears to be dong well.  I will see him back in 4 weeks before he heads into his 4th cycle of adjuvant nivolumab immunotherapy.  The patient understands all the plans discussed today and is in agreement with them.    Diesel Lina Macarthur Critchley, MD

## 2021-01-12 NOTE — Progress Notes (Signed)
Patient OK to be done at [facility] per Dr [name].

## 2021-01-13 ENCOUNTER — Encounter: Payer: Self-pay | Admitting: Cardiothoracic Surgery

## 2021-01-13 ENCOUNTER — Other Ambulatory Visit: Payer: Self-pay

## 2021-01-13 ENCOUNTER — Ambulatory Visit: Payer: BC Managed Care – PPO | Admitting: Cardiothoracic Surgery

## 2021-01-13 VITALS — BP 118/86 | HR 99 | Resp 20 | Ht 69.0 in | Wt 188.0 lb

## 2021-01-13 DIAGNOSIS — Z9889 Other specified postprocedural states: Secondary | ICD-10-CM | POA: Diagnosis not present

## 2021-01-13 DIAGNOSIS — Z9049 Acquired absence of other specified parts of digestive tract: Secondary | ICD-10-CM | POA: Diagnosis not present

## 2021-01-17 NOTE — Progress Notes (Signed)
[address]       ,[place] [address]             [phone]      Thomas Gonzalez [employer] Medical Record #[id number] Date of Birth: August 10, 1966  Referring: Lillard Anes,* Primary Care: Lillard Anes, MD Primary Cardiologist: No primary care provider on file. Medical Oncology: Dr Argentina Donovan  Chief Complaint:   POST OP FOLLOW UP OPERATIVE REPORT DATE OF PROCEDURE:  09/20/2020 PREOPERATIVE DIAGNOSIS:  Adenocarcinoma of the distal esophagus, gastroesophageal junction. POSTOPERATIVE DIAGNOSIS:  Adenocarcinoma of the distal esophagus, gastroesophageal junction. SURGICAL PROCEDURE:  Video bronchoscopy, transhiatal total esophagectomy with cervical esophagogastrotomy, pyloroplasty, placement of feeding jejunostomy tube, removal of PEG tube.  Cancer Staging Esophageal carcinoma [facility]) Staging form: Esophagus - Adenocarcinoma, AJCC 8th Edition - Clinical stage from 09/02/2020: Stage Unknown (cTX, cN2, cM0, G3) - Signed by Grace Isaac, MD on 09/02/2020 - Pathologic stage from 09/23/2020: Stage I (ypT2, pN0, cM0, G2) - Signed by Grace Isaac, MD on 09/23/2020  History of Present Illness:     Patient returns to the office for follow up  postop visit after e following transhiatal total esophagectomy on November 1.   Postoperative wound probed and separated-he is currently been doing wet-to-dry saline dressings  The patient's main problem at this point is healing of his abdominal incision-he has been seen by Dr. Marla Roe    Past Medical History:  Diagnosis Date  . Bowel obstruction ([place])    06/21/2017 reported- "years ago"  . Cancer ([place])    basal cell face  . GERD (gastroesophageal reflux disease)   . History of kidney stones    x 2     Social History   Tobacco Use  Smoking Status Former Smoker  . Years: 0.20  Smokeless Tobacco Former Systems developer  Tobacco Comment   quit in [place] History   Substance and Sexual  Activity  Alcohol Use No   Comment: in high school- not since     Allergies  Allergen Reactions  . Penicillins Anaphylaxis  . Prednisone Anxiety    Mood swings    Current Outpatient Medications  Medication Sig Dispense Refill  . acetaminophen (TYLENOL) 325 MG tablet Take 2 tablets (650 mg total) by mouth every 6 (six) hours as needed.    Marland Kitchen ascorbic acid (VITAMIN C) 500 MG tablet Take 500 mg by mouth daily.    . cholecalciferol (VITAMIN D3) 25 MCG (1000 UNIT) tablet Take 1,000 Units by mouth daily.    Marland Kitchen levofloxacin (LEVAQUIN) 750 MG tablet Take 1 tablet (750 mg total) by mouth daily. 7 tablet 0  . lidocaine (LIDODERM) 5 % USE 1 PATCH EXTERNALLY ONCE DAILY AS NEEDED FOR PAIN. APPLY TOPICALLY TO INTACT SKIN OVER AREA OF GREATEST PAIN. YOU MAY CUT TO SIZE MAXIMUM    . metoprolol tartrate (LOPRESSOR) 25 MG tablet Take 0.5 tablets (12.5 mg total) by mouth 2 (two) times daily. 30 tablet 1  . omeprazole (PRILOSEC) 40 MG capsule Take 40 mg by mouth every morning.    . ondansetron (ZOFRAN) 4 MG tablet Take 1 tablet (4 mg total) by mouth every 4 (four) hours as needed for nausea. 90 tablet 3  . oxyCODONE-acetaminophen (PERCOCET) 10-325 MG tablet Take 1 tablet by mouth every 4 (four) hours as needed for pain.    Marland Kitchen prochlorperazine (COMPAZINE) 10 MG tablet Take 1 tablet (10 mg total) by mouth every 6 (six) hours as  needed for nausea or vomiting. 90 tablet 3  . zinc gluconate 50 MG tablet Take 50 mg by mouth daily.     No current facility-administered medications for this visit.       Physical Exam: BP 118/86 (BP Location: Left Arm, Patient Position: Sitting)   Pulse 99   Resp 20   Ht 5' 9"  (1.753 m)   Wt 188 lb (85.3 kg)   SpO2 96% Comment: RA  BMI 27.76 kg/m  General appearance: alert, cooperative and appears stated age Neck: no adenopathy, no carotid bruit, no JVD, supple, symmetrical, trachea midline and thyroid not enlarged, symmetric, no tenderness/mass/nodules Cardio: regular  rate and rhythm, S1, S2 normal, no murmur, click, rub or gallop GI: soft, non-tender; bowel sounds normal; no masses,  no organomegaly Extremities: extremities normal, atraumatic, no cyanosis or edema and Homans sign is negative, no sign of DVT Neurologic: Grossly normal J-tube sites completely healed over, the wound is clean but not contracting much yet       Diagnostic Studies & Laboratory data:     Recent Radiology Findings:  Narrative & Impression  CLINICAL DATA:  Esophageal carcinoma  EXAM: CHEST - 2 VIEW  COMPARISON:  09/29/2020  FINDINGS: Right Port-A-Cath remains in place, unchanged. Left basilar subsegmental atelectasis with small left effusion. Right lung clear. Heart is normal size. No acute bony abnormality.  IMPRESSION: Small left pleural effusion with left base atelectasis.   Electronically Signed   By: Rolm Baptise M.D.   On: 10/12/2020 15:44    I have independently reviewed the above radiology studies  and reviewed the findings with the patient.   Recent Lab Findings: Lab Results  Component Value Date   WBC 6.5 01/04/2021   HGB 16.0 01/04/2021   HCT 33 (A) 01/04/2021   PLT 244 01/04/2021   GLUCOSE 126 (H) 10/06/2020   ALT 34 01/04/2021   AST 29 01/04/2021   NA 137 01/04/2021   K 4.6 01/04/2021   CL 103 01/04/2021   CREATININE 0.9 01/04/2021   BUN 22 (A) 01/04/2021   CO2 29 (A) 01/04/2021   TSH 0.708 01/04/2021   INR 1.0 09/16/2020      Assessment / Plan:    Patient continues to do well following surgery is for his activity and diet.  He is currently not limited due to healing of the abdominal incision.  He has seen Dr. Marla Roe and is planning debridement  And placement of Matriderm or Myriad   Have made follow-up appointment for the patient in 3 weeks but will modify that depending on recommendations from Dr. Marla Roe.    Medication Changes: No orders of the defined types were placed in this encounter.     Grace Isaac MD      [address] [place] [address] Office [phone]     01/17/2021 1:07 PM

## 2021-01-19 ENCOUNTER — Ambulatory Visit (INDEPENDENT_AMBULATORY_CARE_PROVIDER_SITE_OTHER): Payer: BC Managed Care – PPO | Admitting: Surgical

## 2021-01-19 ENCOUNTER — Encounter: Payer: Self-pay | Admitting: Surgical

## 2021-01-19 ENCOUNTER — Other Ambulatory Visit: Payer: Self-pay

## 2021-01-19 VITALS — BP 126/86 | HR 88 | Ht 69.0 in | Wt 187.0 lb

## 2021-01-19 DIAGNOSIS — S31109A Unspecified open wound of abdominal wall, unspecified quadrant without penetration into peritoneal cavity, initial encounter: Secondary | ICD-10-CM

## 2021-01-19 MED ORDER — ONDANSETRON HCL 4 MG PO TABS: 4.0000 mg | ORAL_TABLET | Freq: Three times a day (TID) | ORAL | 0 refills | Status: DC | PRN

## 2021-01-19 MED ORDER — CIPROFLOXACIN HCL 500 MG PO TABS: 500.0000 mg | ORAL_TABLET | Freq: Two times a day (BID) | ORAL | 0 refills | Status: DC

## 2021-01-19 MED ORDER — HYDROCODONE-ACETAMINOPHEN 5-325 MG PO TABS: 1.0000 | ORAL_TABLET | Freq: Four times a day (QID) | ORAL | 0 refills | Status: AC | PRN

## 2021-01-19 NOTE — H&P (View-Only) (Signed)
Patient ID: Thomas Gonzalez, male    DOB: 1966-01-29, 55 y.o.   MRN: [id number]  Chief Complaint  Patient presents with  . Pre-op Exam      ICD-10-CM   1. Open wound of abdominal wall, initial encounter  S31.109A     History of Present Illness: Thomas Gonzalez is a 55 y.o.  male  with a history of abdominal wound after abdominal surgery and November 2021.  He presents for preoperative evaluation for upcoming procedure, debridement of abdominal wound and application of wound matrix, scheduled for 01/27/2021 with Dr. Marla Roe.  The patient has not had problems with anesthesia. No history of DVT/PE.  No family history of DVT/PE.  No family or personal history of bleeding or clotting disorders.  Patient is not currently taking any blood thinners.  No history of CVA/MI.   Summary of Previous Visit: In November 2021 the patient underwent transhiatal total esophagectomy with cervical esophagogastrostomy and pyloroplasty.  This was done for adenocarcinoma of the distal esophagus.  He has had a resulting abdominal wound since then.  It is 7 x 15 cm and full-thickness.  He has been doing wet-to-dry dressings daily.  PMH Significant for: GERD.  Current chemotherapy.  History of adenocarcinoma.   Past Medical History: Allergies: Allergies  Allergen Reactions  . Penicillins Anaphylaxis  . Prednisone Anxiety    Mood swings    Current Medications:  Current Outpatient Medications:  .  acetaminophen (TYLENOL) 325 MG tablet, Take 2 tablets (650 mg total) by mouth every 6 (six) hours as needed., Disp: , Rfl:  .  ascorbic acid (VITAMIN C) 500 MG tablet, Take 500 mg by mouth daily., Disp: , Rfl:  .  cholecalciferol (VITAMIN D3) 25 MCG (1000 UNIT) tablet, Take 1,000 Units by mouth daily., Disp: , Rfl:  .  ciprofloxacin (CIPRO) 500 MG tablet, Take 1 tablet (500 mg total) by mouth 2 (two) times daily., Disp: 6 tablet, Rfl: 0 .  HYDROcodone-acetaminophen (NORCO) 5-325 MG tablet, Take 1 tablet  by mouth every 6 (six) hours as needed for up to 5 days for severe pain., Disp: 20 tablet, Rfl: 0 .  levofloxacin (LEVAQUIN) 750 MG tablet, Take 1 tablet (750 mg total) by mouth daily., Disp: 7 tablet, Rfl: 0 .  lidocaine (LIDODERM) 5 %, USE 1 PATCH EXTERNALLY ONCE DAILY AS NEEDED FOR PAIN. APPLY TOPICALLY TO INTACT SKIN OVER AREA OF GREATEST PAIN. YOU MAY CUT TO SIZE MAXIMUM, Disp: , Rfl:  .  metoprolol tartrate (LOPRESSOR) 25 MG tablet, Take 0.5 tablets (12.5 mg total) by mouth 2 (two) times daily., Disp: 30 tablet, Rfl: 1 .  omeprazole (PRILOSEC) 40 MG capsule, Take 40 mg by mouth every morning., Disp: , Rfl:  .  ondansetron (ZOFRAN) 4 MG tablet, Take 1 tablet (4 mg total) by mouth every 8 (eight) hours as needed for nausea or vomiting., Disp: 20 tablet, Rfl: 0 .  prochlorperazine (COMPAZINE) 10 MG tablet, Take 1 tablet (10 mg total) by mouth every 6 (six) hours as needed for nausea or vomiting., Disp: 90 tablet, Rfl: 3 .  zinc gluconate 50 MG tablet, Take 50 mg by mouth daily., Disp: , Rfl:  .  ondansetron (ZOFRAN) 4 MG tablet, Take 1 tablet (4 mg total) by mouth every 4 (four) hours as needed for nausea. (Patient not taking: Reported on 01/19/2021), Disp: 90 tablet, Rfl: 3 .  oxyCODONE-acetaminophen (PERCOCET) 10-325 MG tablet, Take 1 tablet by mouth every 4 (four) hours as needed for pain. (  Patient not taking: Reported on 01/19/2021), Disp: , Rfl:   Past Medical Problems: Past Medical History:  Diagnosis Date  . Bowel obstruction ([place])    06/21/2017 reported- "years ago"  . Cancer ([place])    basal cell face  . GERD (gastroesophageal reflux disease)   . History of kidney stones    x 2    Past Surgical History: Past Surgical History:  Procedure Laterality Date  . APPENDECTOMY  2019  . COMPLETE ESOPHAGECTOMY N/A 09/20/2020   Procedure: Transhiatal total ESOPHAGECTOMY with Pyloroplasty;  Surgeon: Grace Isaac, MD;  [facility] OR;  Service: Thoracic;  Laterality: N/A;  . HERNIA REPAIR  Bilateral 2007,2010,2020   Inguinial   . JEJUNOSTOMY N/A 09/20/2020   Procedure: Shanon Rosser;  Surgeon: Grace Isaac, MD;  Location: [facility];  Service: Thoracic;  Laterality: N/A;  . KNEE ARTHROSCOPY Left 06/27/2017   Procedure: ARTHROSCOPY KNEE;  Surgeon: Dorna Leitz, MD;  Location: [facility];  Service: Orthopedics;  Laterality: Left;  . REMOVAL OF GASTROSTOMY TUBE  09/20/2020   Procedure: REMOVAL OF GASTROSTOMY TUBE;  Surgeon: Grace Isaac, MD;  Location: [facility];  Service: Thoracic;;  . VIDEO BRONCHOSCOPY N/A 09/20/2020   Procedure: VIDEO BRONCHOSCOPY;  Surgeon: Grace Isaac, MD;  Location: [facility] OR;  Service: Thoracic;  Laterality: N/A;    Social History: Social History   Socioeconomic History  . Marital status: Married    Spouse name: Not on file  . Number of children: 2  . Years of education: Not on file  . Highest education level: Not on file  Occupational History    Employer: [employer]  Tobacco Use  . Smoking status: Former Smoker    Years: 0.20  . Smokeless tobacco: Former Systems developer  . Tobacco comment: quit in 1985  Vaping Use  . Vaping Use: Never used  Substance and Sexual Activity  . Alcohol use: No    Comment: in high school- not since  . Drug use: No  . Sexual activity: Yes  Other Topics Concern  . Not on file  Social History Narrative  . Not on file   Social Determinants of Health   Financial Resource Strain: Not on file  Food Insecurity: Not on file  Transportation Needs: Not on file  Physical Activity: Not on file  Stress: Not on file  Social Connections: Not on file  Intimate Partner Violence: Not on file    Family History: Family History  Family history unknown: Yes    Review of Systems: Review of Systems  Constitutional: Negative.   Respiratory: Negative.   Cardiovascular: Negative.   Gastrointestinal: Negative.   Neurological: Negative.     Physical Exam: Vital Signs BP 126/86 (BP Location: Right Arm, Patient Position:  Sitting, Cuff Size: Large)   Pulse 88   Ht 5\' 9"  (1.753 m)   Wt 187 lb (84.8 kg)   SpO2 97%   BMI 27.62 kg/m   Physical Exam Constitutional:      General: Not in acute distress.    Appearance: Normal appearance. Not ill-appearing.  HENT:     Head: Normocephalic and atraumatic.  Eyes:     Pupils: Pupils are equal, round Neck:     Musculoskeletal: Normal range of motion.  Cardiovascular:     Rate and Rhythm: Normal rate and regular rhythm.     Pulses: Normal pulses.     Heart sounds: Normal heart sounds. No murmur.  Pulmonary:     Effort: Pulmonary effort is normal. No  respiratory distress.     Breath sounds: Normal breath sounds. No wheezing.  Abdominal:     General: Abdomen is flat. There is no distension.  Abdominal wound is present    Palpations: Abdomen is soft.     Tenderness: There is no abdominal tenderness.  Musculoskeletal: Normal range of motion.  Skin:    General: Skin is warm and dry.     Findings: No erythema or rash.  Neurological:     General: No focal deficit present.     Mental Status: Alert and oriented to person, place, and time. Mental status is at baseline.     Motor: No weakness.  Psychiatric:        Mood and Affect: Mood normal.        Behavior: Behavior normal.       Assessment/Plan: The patient is scheduled for debridement of abdominal wound and placement of wound matrix with Dr. Marla Roe.  Risks, benefits, and alternatives of procedure discussed, questions answered and consent obtained.    Smoking Status: Quit smoking many years ago; Counseling Given?  N/A  Caprini Score: 7, high; Risk Factors include: Age, current chemotherapy, history of cancer/current cancer, BMI greater than 25, and length of planned surgery. Recommendation for mechanical and pharmacological prophylaxis per guideline. Encourage early ambulation.  Given the scheduled procedure and short downtime, do not think postoperative pharmacological prophylaxis is necessary.  We  will continue to monitor symptoms postoperatively.  Pictures obtained:[email or web address]   Post-op Rx sent to pharmacy: Norco, Zofran, Cipro  Patient was provided with the General Surgical Risk consent document and Pain Medication Agreement prior to their appointment.  They had adequate time to read through the risk consent documents and Pain Medication Agreement. We also discussed them in person together during this preop appointment. All of their questions were answered to their satisfaction.  Recommended calling if they have any further questions.  Risk consent form and Pain Medication Agreement to be scanned into patient's chart.  The risks that can be encountered with and after a skin excision and were discussed and include the following but not limited to these: bleeding, infection, delayed healing, anesthesia risks, skin sensation changes, injury to structures including nerves, blood vessels, and muscles which may be temporary or permanent, allergies to tape, suture materials and glues, blood products, topical preparations or injected agents, skin contour irregularities, skin discoloration and swelling, deep vein thrombosis, cardiac and pulmonary complications, pain, which may persist, failure of the graft and possible need for revisional surgery or staged procedures.     Electronically signed by: Carola Rhine Scheeler, PA-C 01/19/2021 9:58 AM

## 2021-01-19 NOTE — Progress Notes (Signed)
Patient ID: Thomas Gonzalez, male    DOB: [birth year], 55 y.o.   MRN: [id number]  Chief Complaint  Patient presents with  . Pre-op Exam      ICD-10-CM   1. Open wound of abdominal wall, initial encounter  S31.109A     History of Present Illness: Thomas Gonzalez is a 55 y.o.  male  with a history of abdominal wound after abdominal surgery and November 2021.  He presents for preoperative evaluation for upcoming procedure, debridement of abdominal wound and application of wound matrix, scheduled for 01/27/2021 with Dr. Marla Roe.  The patient has not had problems with anesthesia. No history of DVT/PE.  No family history of DVT/PE.  No family or personal history of bleeding or clotting disorders.  Patient is not currently taking any blood thinners.  No history of CVA/MI.   Summary of Previous Visit: In November 2021 the patient underwent transhiatal total esophagectomy with cervical esophagogastrostomy and pyloroplasty.  This was done for adenocarcinoma of the distal esophagus.  He has had a resulting abdominal wound since then.  It is 7 x 15 cm and full-thickness.  He has been doing wet-to-dry dressings daily.  PMH Significant for: GERD.  Current chemotherapy.  History of adenocarcinoma.   Past Medical History: Allergies: Allergies  Allergen Reactions  . Penicillins Anaphylaxis  . Prednisone Anxiety    Mood swings    Current Medications:  Current Outpatient Medications:  .  acetaminophen (TYLENOL) 325 MG tablet, Take 2 tablets (650 mg total) by mouth every 6 (six) hours as needed., Disp: , Rfl:  .  ascorbic acid (VITAMIN C) 500 MG tablet, Take 500 mg by mouth daily., Disp: , Rfl:  .  cholecalciferol (VITAMIN D3) 25 MCG (1000 UNIT) tablet, Take 1,000 Units by mouth daily., Disp: , Rfl:  .  ciprofloxacin (CIPRO) 500 MG tablet, Take 1 tablet (500 mg total) by mouth 2 (two) times daily., Disp: 6 tablet, Rfl: 0 .  HYDROcodone-acetaminophen (NORCO) 5-325 MG tablet, Take 1 tablet  by mouth every 6 (six) hours as needed for up to 5 days for severe pain., Disp: 20 tablet, Rfl: 0 .  levofloxacin (LEVAQUIN) 750 MG tablet, Take 1 tablet (750 mg total) by mouth daily., Disp: 7 tablet, Rfl: 0 .  lidocaine (LIDODERM) 5 %, USE 1 PATCH EXTERNALLY ONCE DAILY AS NEEDED FOR PAIN. APPLY TOPICALLY TO INTACT SKIN OVER AREA OF GREATEST PAIN. YOU MAY CUT TO SIZE MAXIMUM, Disp: , Rfl:  .  metoprolol tartrate (LOPRESSOR) 25 MG tablet, Take 0.5 tablets (12.5 mg total) by mouth 2 (two) times daily., Disp: 30 tablet, Rfl: 1 .  omeprazole (PRILOSEC) 40 MG capsule, Take 40 mg by mouth every morning., Disp: , Rfl:  .  ondansetron (ZOFRAN) 4 MG tablet, Take 1 tablet (4 mg total) by mouth every 8 (eight) hours as needed for nausea or vomiting., Disp: 20 tablet, Rfl: 0 .  prochlorperazine (COMPAZINE) 10 MG tablet, Take 1 tablet (10 mg total) by mouth every 6 (six) hours as needed for nausea or vomiting., Disp: 90 tablet, Rfl: 3 .  zinc gluconate 50 MG tablet, Take 50 mg by mouth daily., Disp: , Rfl:  .  ondansetron (ZOFRAN) 4 MG tablet, Take 1 tablet (4 mg total) by mouth every 4 (four) hours as needed for nausea. (Patient not taking: Reported on 01/19/2021), Disp: 90 tablet, Rfl: 3 .  oxyCODONE-acetaminophen (PERCOCET) 10-325 MG tablet, Take 1 tablet by mouth every 4 (four) hours as needed for pain. (  Patient not taking: Reported on 01/19/2021), Disp: , Rfl:   Past Medical Problems: Past Medical History:  Diagnosis Date  . Bowel obstruction ([place])    06/21/2017 reported- "years ago"  . Cancer ([place])    basal cell face  . GERD (gastroesophageal reflux disease)   . History of kidney stones    x 2    Past Surgical History: Past Surgical History:  Procedure Laterality Date  . APPENDECTOMY  2019  . COMPLETE ESOPHAGECTOMY N/A 09/20/2020   Procedure: Transhiatal total ESOPHAGECTOMY with Pyloroplasty;  Surgeon: Grace Isaac, MD;  Location: [facility];  Service: Thoracic;  Laterality: N/A;  . HERNIA REPAIR  Bilateral 2007,2010,2020   Inguinial   . JEJUNOSTOMY N/A 09/20/2020   Procedure: Shanon Rosser;  Surgeon: Grace Isaac, MD;  Location: [place];  Service: Thoracic;  Laterality: N/A;  . KNEE ARTHROSCOPY Left 06/27/2017   Procedure: ARTHROSCOPY KNEE;  Surgeon: Dorna Leitz, MD;  Location: [place];  Service: Orthopedics;  Laterality: Left;  . REMOVAL OF GASTROSTOMY TUBE  09/20/2020   Procedure: REMOVAL OF GASTROSTOMY TUBE;  Surgeon: Grace Isaac, MD;  Location: [place];  Service: Thoracic;;  . VIDEO BRONCHOSCOPY N/A 09/20/2020   Procedure: VIDEO BRONCHOSCOPY;  Surgeon: Grace Isaac, MD;  Location: [facility] OR;  Service: Thoracic;  Laterality: N/A;    Social History: Social History   Socioeconomic History  . Marital status: Married    Spouse name: Not on file  . Number of children: 2  . Years of education: Not on file  . Highest education level: Not on file  Occupational History    Employer: [employer]  Tobacco Use  . Smoking status: Former Smoker    Years: 0.20  . Smokeless tobacco: Former Systems developer  . Tobacco comment: quit in 1985  Vaping Use  . Vaping Use: Never used  Substance and Sexual Activity  . Alcohol use: No    Comment: in high school- not since  . Drug use: No  . Sexual activity: Yes  Other Topics Concern  . Not on file  Social History Narrative  . Not on file   Social Determinants of Health   Financial Resource Strain: Not on file  Food Insecurity: Not on file  Transportation Needs: Not on file  Physical Activity: Not on file  Stress: Not on file  Social Connections: Not on file  Intimate Partner Violence: Not on file    Family History: Family History  Family history unknown: Yes    Review of Systems: Review of Systems  Constitutional: Negative.   Respiratory: Negative.   Cardiovascular: Negative.   Gastrointestinal: Negative.   Neurological: Negative.     Physical Exam: Vital Signs BP 126/86 (BP Location: Right Arm, Patient Position:  Sitting, Cuff Size: Large)   Pulse 88   Ht 5\' 9"  (1.753 m)   Wt 187 lb (84.8 kg)   SpO2 97%   BMI 27.62 kg/m   Physical Exam Constitutional:      General: Not in acute distress.    Appearance: Normal appearance. Not ill-appearing.  HENT:     Head: Normocephalic and atraumatic.  Eyes:     Pupils: Pupils are equal, round Neck:     Musculoskeletal: Normal range of motion.  Cardiovascular:     Rate and Rhythm: Normal rate and regular rhythm.     Pulses: Normal pulses.     Heart sounds: Normal heart sounds. No murmur.  Pulmonary:     Effort: Pulmonary effort is normal. No  respiratory distress.     Breath sounds: Normal breath sounds. No wheezing.  Abdominal:     General: Abdomen is flat. There is no distension.  Abdominal wound is present    Palpations: Abdomen is soft.     Tenderness: There is no abdominal tenderness.  Musculoskeletal: Normal range of motion.  Skin:    General: Skin is warm and dry.     Findings: No erythema or rash.  Neurological:     General: No focal deficit present.     Mental Status: Alert and oriented to person, place, and time. Mental status is at baseline.     Motor: No weakness.  Psychiatric:        Mood and Affect: Mood normal.        Behavior: Behavior normal.       Assessment/Plan: The patient is scheduled for debridement of abdominal wound and placement of wound matrix with Dr. Marla Roe.  Risks, benefits, and alternatives of procedure discussed, questions answered and consent obtained.    Smoking Status: Quit smoking many years ago; Counseling Given?  N/A  Caprini Score: 7, high; Risk Factors include: Age, current chemotherapy, history of cancer/current cancer, BMI greater than 25, and length of planned surgery. Recommendation for mechanical and pharmacological prophylaxis per guideline. Encourage early ambulation.  Given the scheduled procedure and short downtime, do not think postoperative pharmacological prophylaxis is necessary.  We  will continue to monitor symptoms postoperatively.  Pictures obtained:[email or web address]   Post-op Rx sent to pharmacy: Norco, Zofran, Cipro  Patient was provided with the General Surgical Risk consent document and Pain Medication Agreement prior to their appointment.  They had adequate time to read through the risk consent documents and Pain Medication Agreement. We also discussed them in person together during this preop appointment. All of their questions were answered to their satisfaction.  Recommended calling if they have any further questions.  Risk consent form and Pain Medication Agreement to be scanned into patient's chart.  The risks that can be encountered with and after a skin excision and were discussed and include the following but not limited to these: bleeding, infection, delayed healing, anesthesia risks, skin sensation changes, injury to structures including nerves, blood vessels, and muscles which may be temporary or permanent, allergies to tape, suture materials and glues, blood products, topical preparations or injected agents, skin contour irregularities, skin discoloration and swelling, deep vein thrombosis, cardiac and pulmonary complications, pain, which may persist, failure of the graft and possible need for revisional surgery or staged procedures.     Electronically signed by: Carola Rhine Scheeler, PA-C 01/19/2021 9:58 AM

## 2021-01-20 ENCOUNTER — Other Ambulatory Visit: Payer: Self-pay

## 2021-01-20 ENCOUNTER — Encounter (HOSPITAL_BASED_OUTPATIENT_CLINIC_OR_DEPARTMENT_OTHER): Payer: Self-pay | Admitting: Plastic Surgery

## 2021-01-24 ENCOUNTER — Other Ambulatory Visit (HOSPITAL_COMMUNITY)
Admission: RE | Admit: 2021-01-24 | Discharge: 2021-01-24 | Disposition: A | Discharge status: Home / Self Care | Payer: BC Managed Care – PPO | Source: Ambulatory Visit | Attending: Plastic Surgery | Admitting: Plastic Surgery

## 2021-01-24 DIAGNOSIS — Z20822 Contact with and (suspected) exposure to covid-19: Secondary | ICD-10-CM | POA: Insufficient documentation

## 2021-01-24 DIAGNOSIS — Y838 Other surgical procedures as the cause of abnormal reaction of the patient, or of later complication, without mention of misadventure at the time of the procedure: Secondary | ICD-10-CM | POA: Diagnosis not present

## 2021-01-24 DIAGNOSIS — C155 Malignant neoplasm of lower third of esophagus: Secondary | ICD-10-CM | POA: Diagnosis not present

## 2021-01-24 DIAGNOSIS — S31109A Unspecified open wound of abdominal wall, unspecified quadrant without penetration into peritoneal cavity, initial encounter: Secondary | ICD-10-CM | POA: Diagnosis not present

## 2021-01-24 DIAGNOSIS — Z01812 Encounter for preprocedural laboratory examination: Secondary | ICD-10-CM | POA: Insufficient documentation

## 2021-01-24 DIAGNOSIS — Z85828 Personal history of other malignant neoplasm of skin: Secondary | ICD-10-CM | POA: Diagnosis not present

## 2021-01-24 DIAGNOSIS — Z880 Allergy status to penicillin: Secondary | ICD-10-CM | POA: Diagnosis not present

## 2021-01-24 DIAGNOSIS — Z79899 Other long term (current) drug therapy: Secondary | ICD-10-CM | POA: Diagnosis not present

## 2021-01-24 DIAGNOSIS — Z888 Allergy status to other drugs, medicaments and biological substances status: Secondary | ICD-10-CM | POA: Diagnosis not present

## 2021-01-24 DIAGNOSIS — Z87891 Personal history of nicotine dependence: Secondary | ICD-10-CM | POA: Diagnosis not present

## 2021-01-24 DIAGNOSIS — Z87442 Personal history of urinary calculi: Secondary | ICD-10-CM | POA: Diagnosis not present

## 2021-01-24 DIAGNOSIS — T8189XA Other complications of procedures, not elsewhere classified, initial encounter: Secondary | ICD-10-CM | POA: Diagnosis not present

## 2021-01-24 LAB — SARS CORONAVIRUS 2 (TAT 6-24 HRS): SARS Coronavirus 2: NEGATIVE

## 2021-01-27 ENCOUNTER — Ambulatory Visit (HOSPITAL_BASED_OUTPATIENT_CLINIC_OR_DEPARTMENT_OTHER): Payer: BC Managed Care – PPO | Admitting: Anesthesiology

## 2021-01-27 ENCOUNTER — Encounter (HOSPITAL_BASED_OUTPATIENT_CLINIC_OR_DEPARTMENT_OTHER): Payer: Self-pay | Admitting: Plastic Surgery

## 2021-01-27 ENCOUNTER — Ambulatory Visit (HOSPITAL_BASED_OUTPATIENT_CLINIC_OR_DEPARTMENT_OTHER)
Admission: RE | Admit: 2021-01-27 | Discharge: 2021-01-27 | Disposition: A | Discharge status: Home / Self Care | Payer: BC Managed Care – PPO | Attending: Plastic Surgery | Admitting: Plastic Surgery

## 2021-01-27 ENCOUNTER — Encounter (HOSPITAL_BASED_OUTPATIENT_CLINIC_OR_DEPARTMENT_OTHER)
Admission: RE | Disposition: A | Discharge status: Home / Self Care | Payer: Self-pay | Source: Home / Self Care | Attending: Plastic Surgery

## 2021-01-27 ENCOUNTER — Other Ambulatory Visit: Payer: Self-pay

## 2021-01-27 DIAGNOSIS — Y838 Other surgical procedures as the cause of abnormal reaction of the patient, or of later complication, without mention of misadventure at the time of the procedure: Secondary | ICD-10-CM | POA: Diagnosis not present

## 2021-01-27 DIAGNOSIS — S31109A Unspecified open wound of abdominal wall, unspecified quadrant without penetration into peritoneal cavity, initial encounter: Secondary | ICD-10-CM | POA: Insufficient documentation

## 2021-01-27 DIAGNOSIS — Z20822 Contact with and (suspected) exposure to covid-19: Secondary | ICD-10-CM | POA: Insufficient documentation

## 2021-01-27 DIAGNOSIS — C155 Malignant neoplasm of lower third of esophagus: Secondary | ICD-10-CM | POA: Diagnosis not present

## 2021-01-27 DIAGNOSIS — T8189XA Other complications of procedures, not elsewhere classified, initial encounter: Secondary | ICD-10-CM | POA: Insufficient documentation

## 2021-01-27 DIAGNOSIS — K921 Melena: Secondary | ICD-10-CM | POA: Diagnosis not present

## 2021-01-27 DIAGNOSIS — Z87442 Personal history of urinary calculi: Secondary | ICD-10-CM | POA: Insufficient documentation

## 2021-01-27 DIAGNOSIS — Z888 Allergy status to other drugs, medicaments and biological substances status: Secondary | ICD-10-CM | POA: Diagnosis not present

## 2021-01-27 DIAGNOSIS — T8131XA Disruption of external operation (surgical) wound, not elsewhere classified, initial encounter: Secondary | ICD-10-CM | POA: Diagnosis not present

## 2021-01-27 DIAGNOSIS — Z79899 Other long term (current) drug therapy: Secondary | ICD-10-CM | POA: Insufficient documentation

## 2021-01-27 DIAGNOSIS — Z87891 Personal history of nicotine dependence: Secondary | ICD-10-CM | POA: Insufficient documentation

## 2021-01-27 DIAGNOSIS — Z85828 Personal history of other malignant neoplasm of skin: Secondary | ICD-10-CM | POA: Diagnosis not present

## 2021-01-27 DIAGNOSIS — K219 Gastro-esophageal reflux disease without esophagitis: Secondary | ICD-10-CM | POA: Diagnosis not present

## 2021-01-27 DIAGNOSIS — Z880 Allergy status to penicillin: Secondary | ICD-10-CM | POA: Diagnosis not present

## 2021-01-27 DIAGNOSIS — Z859 Personal history of malignant neoplasm, unspecified: Secondary | ICD-10-CM | POA: Diagnosis not present

## 2021-01-27 DIAGNOSIS — C159 Malignant neoplasm of esophagus, unspecified: Secondary | ICD-10-CM | POA: Diagnosis not present

## 2021-01-27 HISTORY — PX: INCISION AND DRAINAGE OF WOUND: SHX1803

## 2021-01-27 HISTORY — PX: APPLICATION OF A-CELL OF EXTREMITY: SHX6303

## 2021-01-27 SURGERY — IRRIGATION AND DEBRIDEMENT WOUND
Anesthesia: General | Site: Abdomen

## 2021-01-27 MED ORDER — SODIUM CHLORIDE 0.9 % IV SOLN: 250.0000 mL | INTRAVENOUS | Status: DC | PRN

## 2021-01-27 MED ORDER — CHLORHEXIDINE GLUCONATE CLOTH 2 % EX PADS: 6.0000 | MEDICATED_PAD | Freq: Once | CUTANEOUS | Status: DC

## 2021-01-27 MED ORDER — SUCCINYLCHOLINE CHLORIDE 20 MG/ML IJ SOLN
INTRAMUSCULAR | Status: DC | PRN
  Administered 2021-01-27: 200 mg via INTRAVENOUS

## 2021-01-27 MED ORDER — HYDROMORPHONE HCL 1 MG/ML IJ SOLN
0.2500 mg | INTRAMUSCULAR | Status: DC | PRN
Start: 2021-01-27 — End: 2021-01-27

## 2021-01-27 MED ORDER — OXYCODONE HCL 5 MG/5ML PO SOLN: 5.0000 mg | Freq: Once | ORAL | Status: DC | PRN

## 2021-01-27 MED ORDER — ONDANSETRON HCL 4 MG/2ML IJ SOLN
INTRAMUSCULAR | Status: AC
  Filled 2021-01-27: qty 2

## 2021-01-27 MED ORDER — FENTANYL CITRATE (PF) 100 MCG/2ML IJ SOLN: 25.0000 ug | INTRAMUSCULAR | Status: DC | PRN

## 2021-01-27 MED ORDER — SODIUM CHLORIDE 0.9% FLUSH: 3.0000 mL | Freq: Two times a day (BID) | INTRAVENOUS | Status: DC

## 2021-01-27 MED ORDER — EPHEDRINE SULFATE 50 MG/ML IJ SOLN
INTRAMUSCULAR | Status: DC | PRN
  Administered 2021-01-27: 10 mg via INTRAVENOUS

## 2021-01-27 MED ORDER — ACETAMINOPHEN 325 MG RE SUPP: 650.0000 mg | RECTAL | Status: DC | PRN

## 2021-01-27 MED ORDER — SODIUM CHLORIDE 0.9% FLUSH: 3.0000 mL | INTRAVENOUS | Status: DC | PRN

## 2021-01-27 MED ORDER — SILVER NITRATE-POT NITRATE 75-25 % EX MISC
CUTANEOUS | Status: AC
  Filled 2021-01-27: qty 20

## 2021-01-27 MED ORDER — BUPIVACAINE HCL (PF) 0.25 % IJ SOLN
INTRAMUSCULAR | Status: AC
  Filled 2021-01-27: qty 30

## 2021-01-27 MED ORDER — LIDOCAINE-EPINEPHRINE 1 %-1:100000 IJ SOLN
INTRAMUSCULAR | Status: DC | PRN
  Administered 2021-01-27: 16 mL

## 2021-01-27 MED ORDER — ACETAMINOPHEN 500 MG PO TABS
ORAL_TABLET | ORAL | Status: AC
  Filled 2021-01-27: qty 2

## 2021-01-27 MED ORDER — LACTATED RINGERS IV SOLN: INTRAVENOUS | Status: DC

## 2021-01-27 MED ORDER — SUCCINYLCHOLINE CHLORIDE 200 MG/10ML IV SOSY
PREFILLED_SYRINGE | INTRAVENOUS | Status: AC
  Filled 2021-01-27: qty 10

## 2021-01-27 MED ORDER — PHENYLEPHRINE 40 MCG/ML (10ML) SYRINGE FOR IV PUSH (FOR BLOOD PRESSURE SUPPORT)
PREFILLED_SYRINGE | INTRAVENOUS | Status: AC
  Filled 2021-01-27: qty 10

## 2021-01-27 MED ORDER — DEXAMETHASONE SODIUM PHOSPHATE 4 MG/ML IJ SOLN
INTRAMUSCULAR | Status: DC | PRN
  Administered 2021-01-27: 5 mg via INTRAVENOUS

## 2021-01-27 MED ORDER — CIPROFLOXACIN IN D5W 400 MG/200ML IV SOLN
INTRAVENOUS | Status: AC
  Filled 2021-01-27: qty 200

## 2021-01-27 MED ORDER — ACETAMINOPHEN 325 MG PO TABS: 650.0000 mg | ORAL_TABLET | ORAL | Status: DC | PRN

## 2021-01-27 MED ORDER — ONDANSETRON HCL 4 MG/2ML IJ SOLN: 4.0000 mg | Freq: Once | INTRAMUSCULAR | Status: DC | PRN

## 2021-01-27 MED ORDER — BUPIVACAINE-EPINEPHRINE (PF) 0.25% -1:200000 IJ SOLN
INTRAMUSCULAR | Status: AC
  Filled 2021-01-27: qty 30

## 2021-01-27 MED ORDER — OXYCODONE HCL 5 MG PO TABS: 5.0000 mg | ORAL_TABLET | ORAL | Status: DC | PRN

## 2021-01-27 MED ORDER — DEXAMETHASONE SODIUM PHOSPHATE 10 MG/ML IJ SOLN
INTRAMUSCULAR | Status: AC
  Filled 2021-01-27: qty 1

## 2021-01-27 MED ORDER — ACETAMINOPHEN 500 MG PO TABS
1000.0000 mg | ORAL_TABLET | Freq: Once | ORAL | Status: AC
  Administered 2021-01-27: 1000 mg via ORAL

## 2021-01-27 MED ORDER — LIDOCAINE HCL (CARDIAC) PF 100 MG/5ML IV SOSY
PREFILLED_SYRINGE | INTRAVENOUS | Status: DC | PRN
  Administered 2021-01-27: 50 mg via INTRAVENOUS

## 2021-01-27 MED ORDER — MIDAZOLAM HCL 2 MG/2ML IJ SOLN
INTRAMUSCULAR | Status: AC
  Filled 2021-01-27: qty 2

## 2021-01-27 MED ORDER — CIPROFLOXACIN IN D5W 400 MG/200ML IV SOLN
400.0000 mg | INTRAVENOUS | Status: AC
  Administered 2021-01-27: 400 mg via INTRAVENOUS

## 2021-01-27 MED ORDER — EPHEDRINE 5 MG/ML INJ
INTRAVENOUS | Status: AC
  Filled 2021-01-27: qty 10

## 2021-01-27 MED ORDER — FENTANYL CITRATE (PF) 100 MCG/2ML IJ SOLN
INTRAMUSCULAR | Status: AC
  Filled 2021-01-27: qty 2

## 2021-01-27 MED ORDER — ONDANSETRON HCL 4 MG/2ML IJ SOLN
INTRAMUSCULAR | Status: DC | PRN
  Administered 2021-01-27: 4 mg via INTRAVENOUS

## 2021-01-27 MED ORDER — FENTANYL CITRATE (PF) 100 MCG/2ML IJ SOLN
INTRAMUSCULAR | Status: DC | PRN
  Administered 2021-01-27: 50 ug via INTRAVENOUS

## 2021-01-27 MED ORDER — PROPOFOL 10 MG/ML IV BOLUS
INTRAVENOUS | Status: DC | PRN
  Administered 2021-01-27: 200 mg via INTRAVENOUS

## 2021-01-27 MED ORDER — LIDOCAINE 2% (20 MG/ML) 5 ML SYRINGE
INTRAMUSCULAR | Status: AC
  Filled 2021-01-27: qty 5

## 2021-01-27 MED ORDER — OXYCODONE HCL 5 MG PO TABS: 5.0000 mg | ORAL_TABLET | Freq: Once | ORAL | Status: DC | PRN

## 2021-01-27 MED ORDER — LIDOCAINE-EPINEPHRINE 1 %-1:100000 IJ SOLN
INTRAMUSCULAR | Status: AC
  Filled 2021-01-27: qty 2

## 2021-01-27 MED ORDER — MIDAZOLAM HCL 5 MG/5ML IJ SOLN
INTRAMUSCULAR | Status: DC | PRN
  Administered 2021-01-27: 1 mg via INTRAVENOUS

## 2021-01-27 SURGICAL SUPPLY — 81 items
ADH SKN CLS APL DERMABOND .7 (GAUZE/BANDAGES/DRESSINGS)
APL SKNCLS STERI-STRIP NONHPOA (GAUZE/BANDAGES/DRESSINGS)
BAG DECANTER FOR FLEXI CONT (MISCELLANEOUS) ×2 IMPLANT
BENZOIN TINCTURE PRP APPL 2/3 (GAUZE/BANDAGES/DRESSINGS) IMPLANT
BLADE HEX COATED 2.75 (ELECTRODE) ×2 IMPLANT
BLADE SURG 10 STRL SS (BLADE) ×2 IMPLANT
BLADE SURG 15 STRL LF DISP TIS (BLADE) ×1 IMPLANT
BLADE SURG 15 STRL SS (BLADE) ×2
BNDG COHESIVE 4X5 TAN STRL (GAUZE/BANDAGES/DRESSINGS) IMPLANT
BNDG ELASTIC 3X5.8 VLCR STR LF (GAUZE/BANDAGES/DRESSINGS) IMPLANT
BNDG ELASTIC 4X5.8 VLCR STR LF (GAUZE/BANDAGES/DRESSINGS) IMPLANT
BNDG ELASTIC 6X5.8 VLCR STR LF (GAUZE/BANDAGES/DRESSINGS) IMPLANT
BNDG GAUZE ELAST 4 BULKY (GAUZE/BANDAGES/DRESSINGS) IMPLANT
CANISTER SUCT 1200ML W/VALVE (MISCELLANEOUS) IMPLANT
COVER BACK TABLE 60X90IN (DRAPES) ×2 IMPLANT
COVER MAYO STAND STRL (DRAPES) ×2 IMPLANT
COVER WAND RF STERILE (DRAPES) IMPLANT
DECANTER SPIKE VIAL GLASS SM (MISCELLANEOUS) IMPLANT
DERMABOND ADVANCED (GAUZE/BANDAGES/DRESSINGS)
DERMABOND ADVANCED .7 DNX12 (GAUZE/BANDAGES/DRESSINGS) IMPLANT
DRAIN CHANNEL 19F RND (DRAIN) IMPLANT
DRAIN PENROSE 1/2X12 LTX STRL (WOUND CARE) IMPLANT
DRAPE INCISE IOBAN 66X45 STRL (DRAPES) IMPLANT
DRAPE LAPAROSCOPIC ABDOMINAL (DRAPES) ×2 IMPLANT
DRAPE LAPAROTOMY 100X72 PEDS (DRAPES) IMPLANT
DRAPE U-SHAPE 76X120 STRL (DRAPES) IMPLANT
DRSG ADAPTIC 3X8 NADH LF (GAUZE/BANDAGES/DRESSINGS) IMPLANT
DRSG CUTIMED SORBACT 7X9 (GAUZE/BANDAGES/DRESSINGS) ×2 IMPLANT
DRSG EMULSION OIL 3X3 NADH (GAUZE/BANDAGES/DRESSINGS) IMPLANT
DRSG HYDROCOLLOID 4X4 (GAUZE/BANDAGES/DRESSINGS) IMPLANT
DRSG PAD ABDOMINAL 8X10 ST (GAUZE/BANDAGES/DRESSINGS) ×4 IMPLANT
ELECT REM PT RETURN 9FT ADLT (ELECTROSURGICAL) ×2
ELECTRODE REM PT RTRN 9FT ADLT (ELECTROSURGICAL) ×1 IMPLANT
EVACUATOR SILICONE 100CC (DRAIN) IMPLANT
GAUZE SPONGE 4X4 12PLY STRL (GAUZE/BANDAGES/DRESSINGS) ×2 IMPLANT
GAUZE SPONGE 4X4 12PLY STRL LF (GAUZE/BANDAGES/DRESSINGS) IMPLANT
GAUZE XEROFORM 1X8 LF (GAUZE/BANDAGES/DRESSINGS) IMPLANT
GAUZE XEROFORM 5X9 LF (GAUZE/BANDAGES/DRESSINGS) IMPLANT
GLOVE SURG ENC MOIS LTX SZ6.5 (GLOVE) ×8 IMPLANT
GOWN STRL REUS W/ TWL LRG LVL3 (GOWN DISPOSABLE) ×2 IMPLANT
GOWN STRL REUS W/TWL LRG LVL3 (GOWN DISPOSABLE) ×4
GRAFT MYRIAD 3 LAYER 10X10 (Graft) ×2 IMPLANT
IV NS IRRIG 3000ML ARTHROMATIC (IV SOLUTION) ×2 IMPLANT
MANIFOLD NEPTUNE II (INSTRUMENTS) IMPLANT
NEEDLE HYPO 25X1 1.5 SAFETY (NEEDLE) IMPLANT
NS IRRIG 1000ML POUR BTL (IV SOLUTION) ×2 IMPLANT
PACK BASIN DAY SURGERY FS (CUSTOM PROCEDURE TRAY) ×2 IMPLANT
PADDING CAST ABS 3INX4YD NS (CAST SUPPLIES)
PADDING CAST ABS 4INX4YD NS (CAST SUPPLIES)
PADDING CAST ABS COTTON 3X4 (CAST SUPPLIES) IMPLANT
PADDING CAST ABS COTTON 4X4 ST (CAST SUPPLIES) IMPLANT
PENCIL SMOKE EVACUATOR (MISCELLANEOUS) ×2 IMPLANT
PIN SAFETY STERILE (MISCELLANEOUS) IMPLANT
SHEET MEDIUM DRAPE 40X70 STRL (DRAPES) ×2 IMPLANT
SLEEVE SCD COMPRESS KNEE MED (STOCKING) IMPLANT
SPLINT FIBERGLASS 3X35 (CAST SUPPLIES) IMPLANT
SPLINT FIBERGLASS 4X30 (CAST SUPPLIES) IMPLANT
SPONGE LAP 18X18 RF (DISPOSABLE) ×4 IMPLANT
STAPLER VISISTAT 35W (STAPLE) IMPLANT
STOCKINETTE IMPERVIOUS LG (DRAPES) IMPLANT
STRIP CLOSURE SKIN 1/2X4 (GAUZE/BANDAGES/DRESSINGS) IMPLANT
SUCTION FRAZIER HANDLE 10FR (MISCELLANEOUS)
SUCTION TUBE FRAZIER 10FR DISP (MISCELLANEOUS) IMPLANT
SURGILUBE 2OZ TUBE FLIPTOP (MISCELLANEOUS) IMPLANT
SUT MNCRL AB 4-0 PS2 18 (SUTURE) IMPLANT
SUT MON AB 3-0 SH 27 (SUTURE)
SUT MON AB 3-0 SH27 (SUTURE) IMPLANT
SUT SILK 3 0 PS 1 (SUTURE) IMPLANT
SUT VIC AB 3-0 FS2 27 (SUTURE) IMPLANT
SUT VIC AB 5-0 PS2 18 (SUTURE) ×4 IMPLANT
SUT VICRYL 4-0 PS2 18IN ABS (SUTURE) IMPLANT
SWAB COLLECTION DEVICE MRSA (MISCELLANEOUS) IMPLANT
SWAB CULTURE ESWAB REG 1ML (MISCELLANEOUS) IMPLANT
SYR BULB IRRIG 60ML STRL (SYRINGE) ×2 IMPLANT
SYR CONTROL 10ML LL (SYRINGE) IMPLANT
TAPE HYPAFIX 6X30 (GAUZE/BANDAGES/DRESSINGS) IMPLANT
TOWEL GREEN STERILE FF (TOWEL DISPOSABLE) ×2 IMPLANT
TRAY DSU PREP LF (CUSTOM PROCEDURE TRAY) ×2 IMPLANT
TUBE CONNECTING 20X1/4 (TUBING) ×2 IMPLANT
UNDERPAD 30X36 HEAVY ABSORB (UNDERPADS AND DIAPERS) ×2 IMPLANT
YANKAUER SUCT BULB TIP NO VENT (SUCTIONS) ×2 IMPLANT

## 2021-01-27 NOTE — Op Note (Signed)
DATE OF OPERATION: 01/27/2021  LOCATION: [facility]  PREOPERATIVE DIAGNOSIS: abdominal wound  POSTOPERATIVE DIAGNOSIS: Same  PROCEDURE: Excision of abdominal wound 5 x 12 cm skin and soft tissue with placement of Myriad 10 x 10 cm  SURGEON: Camara Rosander Sanger Sasha Rogel, DO  ASSISTANT: Roetta Sessions, PA  EBL: 5 cc  CONDITION: Stable  COMPLICATIONS: None  INDICATION: The patient, Thomas Gonzalez, is a 55 y.o. male born on [birth year], is here for treatment of a chronic abdominal wound after surgery for adenocarcinoma.   PROCEDURE DETAILS:  The patient was seen prior to surgery and marked.  The IV antibiotics were given. The patient was taken to the operating room and given a general anesthetic. A standard time out was performed and all information was confirmed by those in the room. SCDs were placed.   The abdomen was prepped and draped.  Local with epinephrine was placed on a gauze over the wound.  The #10 blade was used to excise the 5 x 12 cm wound of nonviable skin and soft tissue.  Bleeding was controlled with the electrocautery and local.  The myriad was then placed on the wound and layered as able to cover the entire area with the 10 x 10 cm sheet.  This was sutured in place with the Vicryl and the sorbact was applied and secured with the 5-0 Vicryl as well.  Ky gel and gauze was applied.  The patient was allowed to wake up and taken to recovery room in stable condition at the end of the case. The family was notified at the end of the case.

## 2021-01-27 NOTE — Interval H&P Note (Signed)
History and Physical Interval Note:  01/27/2021 7:17 AM  Thomas Gonzalez  has presented today for surgery, with the diagnosis of Open wound of abdominal wall.  The various methods of treatment have been discussed with the patient and family. After consideration of risks, benefits and other options for treatment, the patient has consented to  Procedure(s) with comments: Debridement of abdominal wound (N/A) - 45 min APPLICATION OF MYRIAD OR MatriDerm (N/A) as a surgical intervention.  The patient's history has been reviewed, patient examined, no change in status, stable for surgery.  I have reviewed the patient's chart and labs.  Questions were answered to the patient's satisfaction.     Claire S Dillingham   

## 2021-01-27 NOTE — Anesthesia Postprocedure Evaluation (Signed)
Anesthesia Post Note  Patient: Thomas Gonzalez  Procedure(s) Performed: Debridement of abdominal wound (N/A Abdomen) APPLICATION OF MYRIAD OR MatriDerm (N/A Abdomen)     Patient location during evaluation: PACU Anesthesia Type: General Level of consciousness: awake and alert, oriented and patient cooperative Pain management: pain level controlled Vital Signs Assessment: post-procedure vital signs reviewed and stable Respiratory status: spontaneous breathing, nonlabored ventilation and respiratory function stable Cardiovascular status: blood pressure returned to baseline and stable Postop Assessment: no apparent nausea or vomiting Anesthetic complications: no   No complications documented.  Last Vitals:  Vitals:   01/27/21 0841 01/27/21 0852  BP: (!) 126/96 133/90  Pulse: 84 81  Resp: 12 15  Temp:  36.8 C  SpO2: 98% 99%    Last Pain:  Vitals:   01/27/21 0852  TempSrc:   PainSc: 0-No pain                 Elizabeth M Finucane     

## 2021-01-27 NOTE — Transfer of Care (Signed)
Immediate Anesthesia Transfer of Care Note  Patient: Thomas Gonzalez  Procedure(s) Performed: Debridement of abdominal wound (N/A Abdomen) APPLICATION OF MYRIAD OR MatriDerm (N/A Abdomen)  Patient Location: PACU  Anesthesia Type:General  Level of Consciousness: awake, alert , oriented, drowsy and patient cooperative  Airway & Oxygen Therapy: Patient Spontanous Breathing and Patient connected to face mask oxygen  Post-op Assessment: Report given to RN and Post -op Vital signs reviewed and stable  Post vital signs: Reviewed and stable  Last Vitals:  Vitals Value Taken Time  BP 135/97 01/27/21 0818  Temp    Pulse 87 01/27/21 0819  Resp 10 01/27/21 0819  SpO2 100 % 01/27/21 0819  Vitals shown include unvalidated device data.  Last Pain:  Vitals:   01/27/21 0654  TempSrc: Oral  PainSc: 0-No pain      Patients Stated Pain Goal: 7 (01/27/21 0654)  Complications: No complications documented. 

## 2021-01-27 NOTE — Anesthesia Procedure Notes (Signed)
Procedure Name: Intubation Date/Time: 01/27/2021 7:36 AM Performed by: Willa Frater, CRNA Pre-anesthesia Checklist: Patient identified, Emergency Drugs available, Suction available and Patient being monitored Patient Re-evaluated:Patient Re-evaluated prior to induction Oxygen Delivery Method: Circle system utilized Preoxygenation: Pre-oxygenation with 100% oxygen Induction Type: IV induction, Cricoid Pressure applied and Rapid sequence Ventilation: Mask ventilation without difficulty Laryngoscope Size: Mac and 3 Grade View: Grade I Tube type: Oral Tube size: 7.0 mm Number of attempts: 1 Airway Equipment and Method: Stylet and Oral airway Placement Confirmation: ETT inserted through vocal cords under direct vision,  positive ETCO2 and breath sounds checked- equal and bilateral Secured at: 22 cm Tube secured with: Tape Dental Injury: Teeth and Oropharynx as per pre-operative assessment

## 2021-01-27 NOTE — Anesthesia Preprocedure Evaluation (Addendum)
Anesthesia Evaluation  Patient identified by MRN, date of birth, ID band Patient awake    Reviewed: Allergy & Precautions, NPO status , Patient's Chart, lab work & pertinent test results  Airway Mallampati: II  TM Distance: >3 FB Neck ROM: Full    Dental no notable dental hx. (+) Teeth Intact, Dental Advisory Given   Pulmonary former smoker,    Pulmonary exam normal breath sounds clear to auscultation       Cardiovascular negative cardio ROS Normal cardiovascular exam Rhythm:Regular Rate:Normal     Neuro/Psych negative neurological ROS  negative psych ROS   GI/Hepatic Neg liver ROS, GERD  Medicated and Controlled,S/p esophagectomy, jejunostomy w/ J tube 09/2020   Endo/Other  negative endocrine ROS  Renal/GU negative Renal ROS  negative genitourinary   Musculoskeletal negative musculoskeletal ROS (+)   Abdominal   Peds negative pediatric ROS (+)  Hematology negative hematology ROS (+)   Anesthesia Other Findings Open abdominal wound   Reproductive/Obstetrics negative OB ROS                            Anesthesia Physical Anesthesia Plan  ASA: III  Anesthesia Plan: General   Post-op Pain Management:    Induction: Intravenous, Rapid sequence and Cricoid pressure planned  PONV Risk Score and Plan: 3 and Ondansetron, Dexamethasone, Midazolam and Treatment may vary due to age or medical condition  Airway Management Planned: Oral ETT  Additional Equipment: None  Intra-op Plan:   Post-operative Plan: Extubation in OR  Informed Consent: I have reviewed the patients History and Physical, chart, labs and discussed the procedure including the risks, benefits and alternatives for the proposed anesthesia with the patient or authorized representative who has indicated his/her understanding and acceptance.     Dental advisory given  Plan Discussed with: CRNA  Anesthesia Plan Comments:          Anesthesia Quick Evaluation

## 2021-01-27 NOTE — Discharge Instructions (Addendum)
Wound Care with [facility]  Guide to Wound Care  Proper wound care may reduce the risk of infection, improve healing rates, and limit scarring.  This is a general guide to help care for and manage wounds treated with Myriad Wound Matrix.   Dressing Changes The frequency of dressing changes can vary based on which product was applied, the size of the wound, or the amount of wound drainage. Dressing inspections are recommended, at least weekly.   Place KY gel on the wound daily and cover with gauze.  Dressing Types Primary Dressing:  Non-adherent dressing goes directly over wound being treated with the Myriad sheet.  Secondary Dressing:  Secures the primary dressing in place and provides extra protection, compression, and absorption.  1. Wash Hands - To help decrease the risk of infection, caregivers should wash their hands for a minimum of 20 seconds and may use medical gloves.   2. Remove the Dressings - Avoid removing product from the wound by carefully removing the applicable dressing(s) at the time points recommended above, or as recommended by the treating physician.  Expected Color and Odor:  It is entirely normal for the wound to have an unpleasant odor and to form a caramel-colored gel as the product absorbs into the wound. It is important to leave this gel on the wound site.  3. Clean the Wound - Use clean water or saline to gently rinse around the wound surface and remove any excess discharge that may be present on the wound. Do not wipe off any of the caramel-colored gel on the wound.   What to look out for: . Large or increased amount of drainage  . Surrounding skin has worsening redness or hot to touch  . Increased pain in or around the wound  . Flu-like symptoms, fatigue, decreased appetite, fever  . Hard, crusty wound surface with black or brown coloring  4. Apply New Dressings - Dressings should cover the entire wound and be suitable for maintaining a moist wound environment.   The non-adherent mesh dressing should be left in place.  New dressing should consist of KY Jelly to keep the wound moist and soft gauze secured with a wrap or tape.   Maintain a Hydrated Wound Area It is important to keep the wound area moist throughout the healing process. If the wound appears to be dry during dressing changes, select a dressing that will hydrate the wound and maintain that ideal moist environment. If you are unsure what to do, ask the treating physician.  Remodeling Process Every patient heals differently, and no two cases are the same. The size and location of the wound, product type and layering configurations, and general patient health all contribute to how quickly a wound will heal.  While many factors can influence the rate at which the product absorbs, the following can be used as a general guide.   THINGS TO DO: Refrain from smoking High protein diet with plenty of vegetables and some fruit  Limit simple processed carbohydrates and sugar Protect the wound from trauma Protect the dressing     Sheet            Sorbact dressing    Post Anesthesia Home Care Instructions  Activity: Get plenty of rest for the remainder of the day. A responsible individual must stay with you for 24 hours following the procedure.  For the next 24 hours, DO NOT: -Drive a car -Paediatric nurse -Drink alcoholic beverages -Take any medication unless instructed by your  physician -Make any legal decisions or sign important papers.  Meals: Start with liquid foods such as gelatin or soup. Progress to regular foods as tolerated. Avoid greasy, spicy, heavy foods. If nausea and/or vomiting occur, drink only clear liquids until the nausea and/or vomiting subsides. Call your physician if vomiting continues.  Special Instructions/Symptoms: Your throat may feel dry or sore from the anesthesia or the breathing tube placed in your throat during surgery. If this causes discomfort, gargle with warm  salt water. The discomfort should disappear within 24 hours.  If you had a scopolamine patch placed behind your ear for the management of post- operative nausea and/or vomiting:  1. The medication in the patch is effective for 72 hours, after which it should be removed.  Wrap patch in a tissue and discard in the trash. Wash hands thoroughly with soap and water. 2. You may remove the patch earlier than 72 hours if you experience unpleasant side effects which may include dry mouth, dizziness or visual disturbances. 3. Avoid touching the patch. Wash your hands with soap and water after contact with the patch.    May take Tylenol after 1pm, if needed.

## 2021-01-28 ENCOUNTER — Encounter (HOSPITAL_BASED_OUTPATIENT_CLINIC_OR_DEPARTMENT_OTHER): Payer: Self-pay | Admitting: Plastic Surgery

## 2021-02-01 ENCOUNTER — Telehealth: Payer: Self-pay | Admitting: Oncology

## 2021-02-01 ENCOUNTER — Other Ambulatory Visit: Payer: Self-pay

## 2021-02-01 ENCOUNTER — Inpatient Hospital Stay: Payer: BC Managed Care – PPO | Attending: Oncology

## 2021-02-01 ENCOUNTER — Encounter: Payer: Self-pay | Admitting: Legal Medicine

## 2021-02-01 ENCOUNTER — Other Ambulatory Visit: Payer: Self-pay | Admitting: Hematology and Oncology

## 2021-02-01 ENCOUNTER — Inpatient Hospital Stay (INDEPENDENT_AMBULATORY_CARE_PROVIDER_SITE_OTHER): Payer: BC Managed Care – PPO | Admitting: Oncology

## 2021-02-01 ENCOUNTER — Ambulatory Visit (INDEPENDENT_AMBULATORY_CARE_PROVIDER_SITE_OTHER): Payer: BC Managed Care – PPO | Admitting: Legal Medicine

## 2021-02-01 ENCOUNTER — Other Ambulatory Visit: Payer: Self-pay | Admitting: Oncology

## 2021-02-01 VITALS — BP 130/70 | HR 84 | Temp 97.5°F | Resp 16 | Ht 69.0 in | Wt 193.0 lb

## 2021-02-01 VITALS — BP 141/86 | HR 86 | Temp 98.4°F | Resp 16 | Ht 69.0 in | Wt 191.6 lb

## 2021-02-01 DIAGNOSIS — C158 Malignant neoplasm of overlapping sites of esophagus: Secondary | ICD-10-CM | POA: Insufficient documentation

## 2021-02-01 DIAGNOSIS — Z5112 Encounter for antineoplastic immunotherapy: Secondary | ICD-10-CM | POA: Insufficient documentation

## 2021-02-01 DIAGNOSIS — C159 Malignant neoplasm of esophagus, unspecified: Secondary | ICD-10-CM

## 2021-02-01 DIAGNOSIS — Z9221 Personal history of antineoplastic chemotherapy: Secondary | ICD-10-CM | POA: Diagnosis not present

## 2021-02-01 DIAGNOSIS — L02411 Cutaneous abscess of right axilla: Secondary | ICD-10-CM | POA: Diagnosis not present

## 2021-02-01 DIAGNOSIS — Z79899 Other long term (current) drug therapy: Secondary | ICD-10-CM | POA: Insufficient documentation

## 2021-02-01 DIAGNOSIS — Z923 Personal history of irradiation: Secondary | ICD-10-CM | POA: Diagnosis not present

## 2021-02-01 LAB — HEPATIC FUNCTION PANEL
ALT: 34 (ref 10–40)
AST: 28 (ref 14–40)
Alkaline Phosphatase: 103 (ref 25–125)
Bilirubin, Total: 0.6

## 2021-02-01 LAB — BASIC METABOLIC PANEL
BUN: 27 — AB (ref 4–21)
CO2: 26 — AB (ref 13–22)
Chloride: 103 (ref 99–108)
Creatinine: 0.7 (ref 0.6–1.3)
Glucose: 113
Potassium: 4.7 (ref 3.4–5.3)
Sodium: 136 — AB (ref 137–147)

## 2021-02-01 LAB — CBC AND DIFFERENTIAL
HCT: 46 (ref 41–53)
Hemoglobin: 15.6 (ref 13.5–17.5)
Neutrophils Absolute: 5.95
Platelets: 240 (ref 150–399)
WBC: 8.5

## 2021-02-01 LAB — CBC: RBC: 4.83 (ref 3.87–5.11)

## 2021-02-01 LAB — COMPREHENSIVE METABOLIC PANEL
Albumin: 4.1 (ref 3.5–5.0)
Calcium: 9 (ref 8.7–10.7)

## 2021-02-01 LAB — TSH: TSH: 0.623 u[IU]/mL (ref 0.350–4.500)

## 2021-02-01 MED ORDER — SULFAMETHOXAZOLE-TRIMETHOPRIM 800-160 MG PO TABS
1.0000 | ORAL_TABLET | Freq: Two times a day (BID) | ORAL | 2 refills | Status: DC
Start: 2021-02-01 — End: 2021-05-09

## 2021-02-01 NOTE — Telephone Encounter (Signed)
Per 3/15 LOS, patient scheduled for 4/12 (Labs, CT Scan to be scheduled for 4/11 after receiving Pre Authorization) - Gave patient Appt Summary

## 2021-02-01 NOTE — Progress Notes (Signed)
Thomas Gonzalez  [address] [place],  [place]  [address] [id number]  Clinic Day:  2/15//2022  Referring physician: Lillard Anes,*   HISTORY OF PRESENT ILLNESS:  The patient is a 55 y.o. 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  He comes in today to be evaluated before heading into his 4th cycle of maintenance nivolumab.  Since his last visit, the patient has been doing well.  He continues to tolerate his nivolumab immunotherapy very well without having any significant side effects.  He denies having any dysphagia, abdominal discomfort or other GI symptoms which concern him for early disease recurrence.   With respect to his esophageal cancer, he completed neoadjuvant chemoradiation, which consisted of 3 cycles of FOLFOX chemotherapy.  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  Pathology from this surgery showed persistent adenocarcinoma which extended into the muscularis propria level of his stomach.  Based upon this, he is undergoing adjuvant nivolumab immunotherapy for 1 year.   PHYSICAL EXAM:  Blood pressure (!) 141/86, pulse 86, temperature 98.4 F (36.9 C), resp. rate 16, height 5\' 9"  (1.753 m), weight 191 lb 9.6 oz (86.9 kg), SpO2 98 %. Wt Readings from Last 3 Encounters:  02/03/21 187 lb 4 oz (84.9 kg)  02/03/21 191 lb (86.6 kg)  02/01/21 191 lb 9.6 oz (86.9 kg)   Body mass index is 28.29 kg/m. Performance status (ECOG): 1 - Symptomatic but completely ambulatory Physical Exam Constitutional:      Appearance: Normal appearance. He is not ill-appearing.  HENT:     Mouth/Throat:     Mouth: Mucous membranes are moist.     Pharynx: Oropharynx is clear. No oropharyngeal exudate or posterior oropharyngeal erythema.  Cardiovascular:     Rate and Rhythm: Normal rate and regular rhythm.     Heart sounds: No murmur heard. No friction rub. No gallop.   Pulmonary:      Effort: Pulmonary effort is normal. No respiratory distress.     Breath sounds: Normal breath sounds. No wheezing, rhonchi or rales.  Chest:  Breasts:     Right: No axillary adenopathy or supraclavicular adenopathy.     Left: No axillary adenopathy or supraclavicular adenopathy.    Abdominal:     General: Bowel sounds are normal. There is no distension.     Palpations: Abdomen is soft. There is no mass.     Tenderness: There is no abdominal tenderness.  Musculoskeletal:        General: No swelling.     Right lower leg: No edema.     Left lower leg: No edema.  Lymphadenopathy:     Cervical: No cervical adenopathy.     Upper Body:     Right upper body: No supraclavicular or axillary adenopathy.     Left upper body: No supraclavicular or axillary adenopathy.     Lower Body: No right inguinal adenopathy. No left inguinal adenopathy.  Skin:    General: Skin is warm.     Coloration: Skin is not jaundiced.     Findings: No lesion or rash.  Neurological:     General: No focal deficit present.     Mental Status: He is alert and oriented to person, place, and time. Mental status is at baseline.     Cranial Nerves: Cranial nerves are intact.  Psychiatric:        Mood and Affect: Mood normal.  Behavior: Behavior normal.        Thought Content: Thought content normal.     LABS:    Ref. Range 02/01/2021 00:00  WBC Unknown 8.5  RBC Latest Ref Range: 3.87 - 5.11  4.83  Hemoglobin Latest Ref Range: 13.5 - 17.5  15.6  HCT Latest Ref Range: 41 - 53  46  Platelets Latest Ref Range: 150 - 399  240  NEUT# Unknown 5.95    Ref. Range 02/01/2021 13:59  TSH Latest Ref Range: 0.350 - 4.500 uIU/mL 0.623  Thyroxine (T4) Latest Ref Range: 4.5 - 12.0 ug/dL 6.5   CMP Latest Ref Rng & Units 02/01/2021 01/04/2021 12/07/2020  Glucose 70 - 99 mg/dL - - -  BUN 4 - 21 27(A) 22(A) 21  Creatinine 0.6 - 1.3 0.7 0.9 0.8  Sodium 137 - 147 136(A) 137 137  Potassium 3.4 - 5.3 4.7 4.6 4.7  Chloride 99 -  108 103 103 103  CO2 13 - 22 26(A) 29(A) 28(A)  Calcium 8.7 - 10.7 9.0 8.9 9.5  Total Protein 6.5 - 8.1 g/dL - - -  Total Bilirubin 0.3 - 1.2 mg/dL - - -  Alkaline Phos 25 - 125 103 127(A) 136(A)  AST 14 - 40 28 29 29   ALT 10 - 40 34 34 41(A)   ASSESSMENT & PLAN:  Assessment/Plan:  A 55 y.o. 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  He will proceed with his 4th cycle of adjuvant nivolumab immunotherapy this week, which is being given at 480 mg once every 4 weeks.  Clinically, the patient appears to be dong well.  I will see him back in 4 weeks before he heads into his 5th cycle of adjuvant nivolumab immunotherapy.  CT scans will be done before his next visit to ensure there remains no evidence of disease recurrence while on maintenance nivolumab.  The patient understands all the plans discussed today and is in agreement with them.    Tawnia Schirm Macarthur Critchley, MD

## 2021-02-01 NOTE — Progress Notes (Signed)
Acute Office Visit  Subjective:    Patient ID: Thomas Gonzalez, male    DOB: 1966/01/21, 55 y.o.   MRN: [id number]  Chief Complaint  Patient presents with  . Rash    Under R armpit, states started off as a bump about a month ago and now has spread under arm. Complains of burning sensation at times.    HPI Patient is in today for rash right axilla for one month, it is spreading and burning. Patient has an abscess in right axilla, he recently 01/27/2021 had revision of abdominal wound.  He is on cipro only.  The area shows pointing with surrounding erythema, tender to touch. It is an abscess  Past Medical History:  Diagnosis Date  . Bowel obstruction ([facility])    06/21/2017 reported- "years ago"  . Cancer ([facility])    basal cell face  . GERD (gastroesophageal reflux disease)   . History of kidney stones    x 2    Past Surgical History:  Procedure Laterality Date  . APPENDECTOMY  2019  . APPLICATION OF A-CELL OF EXTREMITY N/A 01/27/2021   Procedure: APPLICATION OF MYRIAD OR MatriDerm;  Surgeon: Wallace Going, DO;  Location: [facility];  Service: Plastics;  Laterality: N/A;  . COMPLETE ESOPHAGECTOMY N/A 09/20/2020   Procedure: Transhiatal total ESOPHAGECTOMY with Pyloroplasty;  Surgeon: Grace Isaac, MD;  Location: [facility];  Service: Thoracic;  Laterality: N/A;  . HERNIA REPAIR Bilateral 2007,2010,2020   Inguinial   . INCISION AND DRAINAGE OF WOUND N/A 01/27/2021   Procedure: Debridement of abdominal wound;  Surgeon: Wallace Going, DO;  Location: [facility];  Service: Plastics;  Laterality: N/A;  45 min  . JEJUNOSTOMY N/A 09/20/2020   Procedure: Shanon Rosser;  Surgeon: Grace Isaac, MD;  Location: [facility];  Service: Thoracic;  Laterality: N/A;  . KNEE ARTHROSCOPY Left 06/27/2017   Procedure: ARTHROSCOPY KNEE;  Surgeon: Dorna Leitz, MD;  Location: [facility];  Service: Orthopedics;  Laterality: Left;  . REMOVAL OF GASTROSTOMY TUBE  09/20/2020    Procedure: REMOVAL OF GASTROSTOMY TUBE;  Surgeon: Grace Isaac, MD;  Location: [facility];  Service: Thoracic;;  . VIDEO BRONCHOSCOPY N/A 09/20/2020   Procedure: VIDEO BRONCHOSCOPY;  Surgeon: Grace Isaac, MD;  Location: [facility] OR;  Service: Thoracic;  Laterality: N/A;    Family History  Family history unknown: Yes    Social History   Socioeconomic History  . Marital status: Married    Spouse name: Not on file  . Number of children: 2  . Years of education: Not on file  . Highest education level: Not on file  Occupational History    Employer: [employer]  Tobacco Use  . Smoking status: Former Smoker    Years: 0.20  . Smokeless tobacco: Former Systems developer  . Tobacco comment: quit in 1985  Vaping Use  . Vaping Use: Never used  Substance and Sexual Activity  . Alcohol use: No    Comment: in high school- not since  . Drug use: No  . Sexual activity: Yes  Other Topics Concern  . Not on file  Social History Narrative  . Not on file   Social Determinants of Health   Financial Resource Strain: Not on file  Food Insecurity: Not on file  Transportation Needs: Not on file  Physical Activity: Not on file  Stress: Not on file  Social Connections: Not on file  Intimate Partner Violence: Not on file  Outpatient Medications Prior to Visit  Medication Sig Dispense Refill  . acetaminophen (TYLENOL) 325 MG tablet Take 2 tablets (650 mg total) by mouth every 6 (six) hours as needed.    Marland Kitchen ascorbic acid (VITAMIN C) 500 MG tablet Take 500 mg by mouth daily.    . cholecalciferol (VITAMIN D3) 25 MCG (1000 UNIT) tablet Take 1,000 Units by mouth daily.    Marland Kitchen lidocaine (LIDODERM) 5 % USE 1 PATCH EXTERNALLY ONCE DAILY AS NEEDED FOR PAIN. APPLY TOPICALLY TO INTACT SKIN OVER AREA OF GREATEST PAIN. YOU MAY CUT TO SIZE MAXIMUM    . metoprolol tartrate (LOPRESSOR) 25 MG tablet Take 0.5 tablets (12.5 mg total) by mouth 2 (two) times daily. 30 tablet 1  . omeprazole (PRILOSEC) 40 MG  capsule Take 40 mg by mouth every morning.    . ondansetron (ZOFRAN) 4 MG tablet Take 1 tablet (4 mg total) by mouth every 4 (four) hours as needed for nausea. 90 tablet 3  . prochlorperazine (COMPAZINE) 10 MG tablet Take 1 tablet (10 mg total) by mouth every 6 (six) hours as needed for nausea or vomiting. 90 tablet 3  . zinc gluconate 50 MG tablet Take 50 mg by mouth daily.     No facility-administered medications prior to visit.    Allergies  Allergen Reactions  . Penicillins Anaphylaxis  . Prednisone Anxiety    Mood swings    Review of Systems  Constitutional: Negative.   HENT: Negative.   Respiratory: Negative.   Cardiovascular: Negative for chest pain, palpitations and leg swelling.  Gastrointestinal: Negative for abdominal distention and abdominal pain.  Genitourinary: Negative.   Musculoskeletal: Negative for arthralgias and back pain.  Skin: Positive for wound.  Neurological: Negative.        Objective:    Physical Exam Constitutional:      Appearance: Normal appearance.  HENT:     Right Ear: Tympanic membrane normal.     Left Ear: Tympanic membrane normal.     Mouth/Throat:     Mouth: Mucous membranes are moist.     Pharynx: Oropharynx is clear.  Cardiovascular:     Rate and Rhythm: Normal rate and regular rhythm.     Pulses: Normal pulses.  Pulmonary:     Effort: Pulmonary effort is normal.     Breath sounds: No rales.  Musculoskeletal:     Cervical back: Normal range of motion.  Skin:    Findings: Erythema present.          Comments: Abscess right axilla, I & D packed large revision of midline abdominal wound   Neurological:     General: No focal deficit present.     Mental Status: He is alert and oriented to person, place, and time.     BP 130/70 (BP Location: Right Arm, Patient Position: Sitting, Cuff Size: Normal)   Pulse 84   Temp (!) 97.5 F (36.4 C) (Temporal)   Resp 16   Ht 5' 9" (1.753 m)   Wt 193 lb (87.5 kg)   SpO2 96%   BMI  28.50 kg/m  Wt Readings from Last 3 Encounters:  02/01/21 193 lb (87.5 kg)  01/27/21 192 lb 10.9 oz (87.4 kg)  01/19/21 187 lb (84.8 kg)    Health Maintenance Due  Topic Date Due  . Hepatitis C Screening  Never done  . HIV Screening  Never done  . TETANUS/TDAP  Never done  . COLONOSCOPY (Pts 45-78yr Insurance coverage will need to be confirmed)  Never  done    There are no preventive care reminders to display for this patient.   Lab Results  Component Value Date   TSH 0.708 01/04/2021   Lab Results  Component Value Date   WBC 6.5 01/04/2021   HGB 16.0 01/04/2021   HCT 33 (A) 01/04/2021   MCV 95 (A) 01/04/2021   PLT 244 01/04/2021   Lab Results  Component Value Date   NA 137 01/04/2021   K 4.6 01/04/2021   CO2 29 (A) 01/04/2021   GLUCOSE 126 (H) 10/06/2020   BUN 22 (A) 01/04/2021   CREATININE 0.9 01/04/2021   BILITOT 0.5 10/04/2020   ALKPHOS 127 (A) 01/04/2021   AST 29 01/04/2021   ALT 34 01/04/2021   PROT 5.4 (L) 10/04/2020   ALBUMIN 4.1 01/04/2021   CALCIUM 8.9 01/04/2021   ANIONGAP 6 10/06/2020   No results found for: CHOL No results found for: HDL No results found for: LDLCALC No results found for: TRIG No results found for: CHOLHDL No results found for: HGBA1C     Assessment & Plan:  Diagnoses and all orders for this visit: Abscess of right axilla -     sulfamethoxazole-trimethoprim (BACTRIM DS) 800-160 MG tablet; Take 1 tablet by mouth 2 (two) times daily. -     Anaerobic and Aerobic Culture  Patient was prepped and draped per usual manner after informed consent.  The abscess was I & Dd, with clear fluid expressed.  This was cultured.  The wound was packed and we will follow up 2 days.  Started on bactrim.  Keep clean so as not to infect the abdominal revision.      I spent 20 minutes dedicated to the care of this patient on the date of this encounter to include face-to-face time with the patient, as well as:  Follow-up: Return in about 2 days  (around 02/03/2021).  An After Visit Summary was printed and given to the patient.  [name], MD [facility] [phone]

## 2021-02-02 LAB — T4: T4, Total: 6.5 ug/dL (ref 4.5–12.0)

## 2021-02-03 ENCOUNTER — Ambulatory Visit (INDEPENDENT_AMBULATORY_CARE_PROVIDER_SITE_OTHER): Payer: BC Managed Care – PPO | Admitting: Legal Medicine

## 2021-02-03 ENCOUNTER — Encounter: Payer: Self-pay | Admitting: Legal Medicine

## 2021-02-03 ENCOUNTER — Inpatient Hospital Stay: Payer: BC Managed Care – PPO

## 2021-02-03 ENCOUNTER — Other Ambulatory Visit: Payer: Self-pay

## 2021-02-03 VITALS — BP 128/82 | HR 90 | Temp 98.1°F | Resp 18 | Ht 69.0 in | Wt 187.2 lb

## 2021-02-03 VITALS — BP 120/82 | HR 94 | Temp 98.7°F | Resp 16 | Ht 69.0 in | Wt 191.0 lb

## 2021-02-03 DIAGNOSIS — Z79899 Other long term (current) drug therapy: Secondary | ICD-10-CM | POA: Diagnosis not present

## 2021-02-03 DIAGNOSIS — Z9221 Personal history of antineoplastic chemotherapy: Secondary | ICD-10-CM | POA: Diagnosis not present

## 2021-02-03 DIAGNOSIS — Z923 Personal history of irradiation: Secondary | ICD-10-CM | POA: Diagnosis not present

## 2021-02-03 DIAGNOSIS — L02411 Cutaneous abscess of right axilla: Secondary | ICD-10-CM

## 2021-02-03 DIAGNOSIS — C158 Malignant neoplasm of overlapping sites of esophagus: Secondary | ICD-10-CM | POA: Diagnosis not present

## 2021-02-03 DIAGNOSIS — Z5112 Encounter for antineoplastic immunotherapy: Secondary | ICD-10-CM | POA: Diagnosis not present

## 2021-02-03 DIAGNOSIS — C159 Malignant neoplasm of esophagus, unspecified: Secondary | ICD-10-CM

## 2021-02-03 MED ORDER — HEPARIN SOD (PORK) LOCK FLUSH 100 UNIT/ML IV SOLN
500.0000 [IU] | Freq: Once | INTRAVENOUS | Status: AC | PRN
  Administered 2021-02-03: 500 [IU]
  Filled 2021-02-03: qty 5

## 2021-02-03 MED ORDER — NIVOLUMAB CHEMO INJECTION 100 MG/10ML
480.0000 mg | Freq: Once | INTRAVENOUS | Status: AC
  Administered 2021-02-03: 480 mg via INTRAVENOUS
  Filled 2021-02-03: qty 48

## 2021-02-03 MED ORDER — SODIUM CHLORIDE 0.9 % IV SOLN
Freq: Once | INTRAVENOUS | Status: AC
  Filled 2021-02-03: qty 250

## 2021-02-03 NOTE — Patient Instructions (Signed)
[facility] Discharge Instructions for Patients Receiving Chemotherapy  Today you received the following chemotherapy agents Nivolumab.  To help prevent nausea and vomiting after your treatment, we encourage you to take your nausea medication.   Nivolumab injection What is this medicine? NIVOLUMAB (nye VOL ue mab) is a monoclonal antibody. It treats certain types of cancer. Some of the cancers treated are colon cancer, head and neck cancer, Hodgkin lymphoma, lung cancer, and melanoma. This medicine may be used for other purposes; ask your health care provider or pharmacist if you have questions. COMMON BRAND NAME(S): Opdivo What should I tell my health care provider before I take this medicine? They need to know if you have any of these conditions:  autoimmune diseases like Crohn's disease, ulcerative colitis, or lupus  have had or planning to have an allogeneic stem cell transplant (uses someone else's stem cells)  history of chest radiation  history of organ transplant  nervous system problems like myasthenia gravis or Guillain-Barre syndrome  an unusual or allergic reaction to nivolumab, other medicines, foods, dyes, or preservatives  pregnant or trying to get pregnant  breast-feeding How should I use this medicine? This medicine is for infusion into a vein. It is given by a health care professional in a hospital or clinic setting. A special MedGuide will be given to you before each treatment. Be sure to read this information carefully each time. Talk to your pediatrician regarding the use of this medicine in children. While this drug may be prescribed for children as young as 12 years for selected conditions, precautions do apply. Overdosage: If you think you have taken too much of this medicine contact a poison control center or emergency room at once. NOTE: This medicine is only for you. Do not share this medicine with others. What if I miss a  dose? It is important not to miss your dose. Call your doctor or health care professional if you are unable to keep an appointment. What may interact with this medicine? Interactions have not been studied. This list may not describe all possible interactions. Give your health care provider a list of all the medicines, herbs, non-prescription drugs, or dietary supplements you use. Also tell them if you smoke, drink alcohol, or use illegal drugs. Some items may interact with your medicine. What should I watch for while using this medicine? This drug may make you feel generally unwell. Continue your course of treatment even though you feel ill unless your doctor tells you to stop. You may need blood work done while you are taking this medicine. Do not become pregnant while taking this medicine or for 5 months after stopping it. Women should inform their doctor if they wish to become pregnant or think they might be pregnant. There is a potential for serious side effects to an unborn child. Talk to your health care professional or pharmacist for more information. Do not breast-feed an infant while taking this medicine or for 5 months after stopping it. What side effects may I notice from receiving this medicine? Side effects that you should report to your doctor or health care professional as soon as possible:  allergic reactions like skin rash, itching or hives, swelling of the face, lips, or tongue  breathing problems  blood in the urine  bloody or watery diarrhea or black, tarry stools  changes in emotions or moods  changes in vision  chest pain  cough  dizziness  feeling faint or lightheaded, falls  fever, chills  headache with fever, neck stiffness, confusion, loss of memory, sensitivity to light, hallucination, loss of contact with reality, or seizures  joint pain  mouth sores  redness, blistering, peeling or loosening of the skin, including inside the mouth  severe muscle  pain or weakness  signs and symptoms of high blood sugar such as dizziness; dry mouth; dry skin; fruity breath; nausea; stomach pain; increased hunger or thirst; increased urination  signs and symptoms of kidney injury like trouble passing urine or change in the amount of urine  signs and symptoms of liver injury like dark yellow or brown urine; general ill feeling or flu-like symptoms; light-colored stools; loss of appetite; nausea; right upper belly pain; unusually weak or tired; yellowing of the eyes or skin  swelling of the ankles, feet, hands  trouble passing urine or change in the amount of urine  unusually weak or tired  weight gain or loss Side effects that usually do not require medical attention (report to your doctor or health care professional if they continue or are bothersome):  bone pain  constipation  decreased appetite  diarrhea  muscle pain  nausea, vomiting  tiredness This list may not describe all possible side effects. Call your doctor for medical advice about side effects. You may report side effects to FDA at 1-800-FDA-1088. Where should I keep my medicine? This drug is given in a hospital or clinic and will not be stored at home. NOTE: This sheet is a summary. It may not cover all possible information. If you have questions about this medicine, talk to your doctor, pharmacist, or health care provider.  2021 Elsevier/Gold Standard (2020-03-10 10:08:25)    If you develop nausea and vomiting that is not controlled by your nausea medication, call the clinic.   BELOW ARE SYMPTOMS THAT SHOULD BE REPORTED IMMEDIATELY:  *FEVER GREATER THAN 100.5 F  *CHILLS WITH OR WITHOUT FEVER  NAUSEA AND VOMITING THAT IS NOT CONTROLLED WITH YOUR NAUSEA MEDICATION  *UNUSUAL SHORTNESS OF BREATH  *UNUSUAL BRUISING OR BLEEDING  TENDERNESS IN MOUTH AND THROAT WITH OR WITHOUT PRESENCE OF ULCERS  *URINARY PROBLEMS  *BOWEL PROBLEMS  UNUSUAL RASH Items with *  indicate a potential emergency and should be followed up as soon as possible.  Feel free to call the clinic should you have any questions or concerns at The clinic phone number is [phone].  Please show the [facility] at check-in to the Emergency Department and triage nurse.

## 2021-02-03 NOTE — Progress Notes (Signed)
Subjective:  Patient ID: Thomas Gonzalez, male    DOB: 1966/11/18  Age: 55 y.o. MRN: [id number]  Chief Complaint  Patient presents with  . Wound Check    HPI: recheck right axillary abscess, cultures pending, no purulence.  Packing removed and wound dressed.   Current Outpatient Medications on File Prior to Visit  Medication Sig Dispense Refill  . acetaminophen (TYLENOL) 325 MG tablet Take 2 tablets (650 mg total) by mouth every 6 (six) hours as needed.    Marland Kitchen ascorbic acid (VITAMIN C) 500 MG tablet Take 500 mg by mouth daily.    . cholecalciferol (VITAMIN D3) 25 MCG (1000 UNIT) tablet Take 1,000 Units by mouth daily.    Marland Kitchen lidocaine (LIDODERM) 5 % USE 1 PATCH EXTERNALLY ONCE DAILY AS NEEDED FOR PAIN. APPLY TOPICALLY TO INTACT SKIN OVER AREA OF GREATEST PAIN. YOU MAY CUT TO SIZE MAXIMUM    . metoprolol tartrate (LOPRESSOR) 25 MG tablet Take 0.5 tablets (12.5 mg total) by mouth 2 (two) times daily. 30 tablet 1  . omeprazole (PRILOSEC) 40 MG capsule Take 40 mg by mouth every morning.    . ondansetron (ZOFRAN) 4 MG tablet Take 1 tablet (4 mg total) by mouth every 4 (four) hours as needed for nausea. 90 tablet 3  . prochlorperazine (COMPAZINE) 10 MG tablet Take 1 tablet (10 mg total) by mouth every 6 (six) hours as needed for nausea or vomiting. 90 tablet 3  . sulfamethoxazole-trimethoprim (BACTRIM DS) 800-160 MG tablet Take 1 tablet by mouth 2 (two) times daily. 14 tablet 2  . zinc gluconate 50 MG tablet Take 50 mg by mouth daily.     Current Facility-Administered Medications on File Prior to Visit  Medication Dose Route Frequency Provider Last Rate Last Admin  . heparin lock flush 100 unit/mL  500 Units Intracatheter Once PRN [name], MD       Past Medical History:  Diagnosis Date  . Bowel obstruction ([place])    06/21/2017 reported- "years ago"  . Cancer ([place])    basal cell face  . GERD (gastroesophageal reflux disease)   . History of kidney stones    x 2   Past Surgical  History:  Procedure Laterality Date  . APPENDECTOMY  2019  . APPLICATION OF A-CELL OF EXTREMITY N/A 01/27/2021   Procedure: APPLICATION OF MYRIAD OR MatriDerm;  Surgeon: Wallace Going, DO;  Location: [place];  Service: Plastics;  Laterality: N/A;  . COMPLETE ESOPHAGECTOMY N/A 09/20/2020   Procedure: Transhiatal total ESOPHAGECTOMY with Pyloroplasty;  Surgeon: Grace Isaac, MD;  Location: [place];  Service: Thoracic;  Laterality: N/A;  . HERNIA REPAIR Bilateral 2007,2010,2020   Inguinial   . INCISION AND DRAINAGE OF WOUND N/A 01/27/2021   Procedure: Debridement of abdominal wound;  Surgeon: Wallace Going, DO;  Location: [place];  Service: Plastics;  Laterality: N/A;  45 min  . JEJUNOSTOMY N/A 09/20/2020   Procedure: Shanon Rosser;  Surgeon: Grace Isaac, MD;  Location: [place];  Service: Thoracic;  Laterality: N/A;  . KNEE ARTHROSCOPY Left 06/27/2017   Procedure: ARTHROSCOPY KNEE;  Surgeon: Dorna Leitz, MD;  Location: [place];  Service: Orthopedics;  Laterality: Left;  . REMOVAL OF GASTROSTOMY TUBE  09/20/2020   Procedure: REMOVAL OF GASTROSTOMY TUBE;  Surgeon: Grace Isaac, MD;  Location: [place];  Service: Thoracic;;  . VIDEO BRONCHOSCOPY N/A 09/20/2020   Procedure: VIDEO BRONCHOSCOPY;  Surgeon: Grace Isaac, MD;  Location: [place];  Service: Thoracic;  Laterality: N/A;    Family History  Family history unknown: Yes   Social History   Socioeconomic History  . Marital status: Married    Spouse name: Not on file  . Number of children: 2  . Years of education: Not on file  . Highest education level: Not on file  Occupational History    Employer: [employer]  Tobacco Use  . Smoking status: Former Smoker    Years: 0.20  . Smokeless tobacco: Former Systems developer  . Tobacco comment: quit in 1985  Vaping Use  . Vaping Use: Never used  Substance and Sexual Activity  . Alcohol use: No    Comment: in high school- not since  .  Drug use: No  . Sexual activity: Yes  Other Topics Concern  . Not on file  Social History Narrative  . Not on file   Social Determinants of Health   Financial Resource Strain: Not on file  Food Insecurity: Not on file  Transportation Needs: Not on file  Physical Activity: Not on file  Stress: Not on file  Social Connections: Not on file    Review of Systems   Objective:  BP 120/82   Pulse 94   Temp 98.7 F (37.1 C)   Resp 16   Ht 5' 9"  (1.753 m)   Wt 191 lb (86.6 kg)   SpO2 97%   BMI 28.21 kg/m   BP/Weight 02/03/2021 02/03/2021 1/51/7616  Systolic BP 073 710 626  Diastolic BP 82 82 70  Wt. (Lbs) 191 187.25 193  BMI 28.21 27.65 28.5    Physical Exam Vitals reviewed.  Constitutional:      Appearance: Normal appearance.  Cardiovascular:     Rate and Rhythm: Normal rate and regular rhythm.     Pulses: Normal pulses.     Heart sounds: Normal heart sounds. No murmur heard. No gallop.   Pulmonary:     Effort: Pulmonary effort is normal. No respiratory distress.     Breath sounds: Normal breath sounds. No rales.  Skin:    Comments: Right chest incision healing well, no further purulence.  Neurological:     Mental Status: He is alert.       Lab Results  Component Value Date   WBC 8.5 02/01/2021   HGB 15.6 02/01/2021   HCT 46 02/01/2021   PLT 240 02/01/2021   GLUCOSE 126 (H) 10/06/2020   ALT 34 02/01/2021   AST 28 02/01/2021   NA 136 (A) 02/01/2021   K 4.7 02/01/2021   CL 103 02/01/2021   CREATININE 0.7 02/01/2021   BUN 27 (A) 02/01/2021   CO2 26 (A) 02/01/2021   TSH 0.623 02/01/2021   INR 1.0 09/16/2020      Assessment & Plan:   1. Abscess of right axilla Healing well, packing removed, cuture not returned yet, follow up one week         I spent 15 minutes dedicated to the care of this patient on the date of this encounter to include face-to-face time with the patient, as well as:  Follow-up: Return in about 1 week (around 02/10/2021)  for wound.  An After Visit Summary was printed and given to the patient.  [name], MD [facility] [phone]

## 2021-02-07 LAB — ANAEROBIC AND AEROBIC CULTURE

## 2021-02-07 NOTE — Progress Notes (Signed)
Culture grew out staph aureus- sensitive to sulfa drugs lp

## 2021-02-08 ENCOUNTER — Encounter: Payer: Self-pay | Admitting: Plastic Surgery

## 2021-02-08 ENCOUNTER — Ambulatory Visit (INDEPENDENT_AMBULATORY_CARE_PROVIDER_SITE_OTHER): Payer: BC Managed Care – PPO | Admitting: Plastic Surgery

## 2021-02-08 ENCOUNTER — Other Ambulatory Visit: Payer: Self-pay

## 2021-02-08 VITALS — BP 121/81 | HR 90

## 2021-02-08 DIAGNOSIS — S31109A Unspecified open wound of abdominal wall, unspecified quadrant without penetration into peritoneal cavity, initial encounter: Secondary | ICD-10-CM

## 2021-02-08 NOTE — Progress Notes (Signed)
   Subjective:    Patient ID: Thomas Gonzalez, male    DOB: 1966-02-24, 55 y.o.   MRN: [id number]  The patient is a 55 year old male here for follow-up after undergoing surgery on his abdomen.  He has myriad on the abdomen.  He has good incorporation so far.  There is no sign of infection.  He denies any pain.  Overall he is pleased with his progress.  He is wanting to increase his activity.     Review of Systems  Constitutional: Negative.   HENT: Negative.   Eyes: Negative.   Respiratory: Negative.   Cardiovascular: Negative.   Genitourinary: Negative.        Objective:   Physical Exam Vitals and nursing note reviewed.  Constitutional:      Appearance: Normal appearance.  Cardiovascular:     Rate and Rhythm: Normal rate.  Neurological:     Mental Status: He is alert. Mental status is at baseline.  Psychiatric:        Mood and Affect: Mood normal.        Behavior: Behavior normal.       Assessment & Plan:     ICD-10-CM   1. Open wound of abdominal wall, initial encounter  S31.109A     Continue with [place] dressing changes.  We will also order some more supplies and endoform from [facility].  We can probably start the endoform in the next week or 2.  I had like to see him back in 1 week

## 2021-02-09 ENCOUNTER — Telehealth: Payer: Self-pay | Admitting: *Deleted

## 2021-02-09 DIAGNOSIS — S31109A Unspecified open wound of abdominal wall, unspecified quadrant without penetration into peritoneal cavity, initial encounter: Secondary | ICD-10-CM | POA: Diagnosis not present

## 2021-02-09 NOTE — Telephone Encounter (Signed)
Faxed order to [facility] for medical supplies for the patient on (02/09/21).  Confirmation received and copy scanned into the chart.//AB/CMA   Supplies:Hydrogel Dressing-Daily                ABD pads-Daily                Gauze-4x4-Daily                Adaptic-3x8-Daily                Tape-Medipore 4 inches-Daily                Endoform-7x4-Daily   Received Order Status Notification on (02/09/2021) from [facility].  Stating:[facility] has provided service for the patient; no further action is required.//AB/CMA

## 2021-02-10 ENCOUNTER — Other Ambulatory Visit: Payer: Self-pay

## 2021-02-10 ENCOUNTER — Ambulatory Visit (INDEPENDENT_AMBULATORY_CARE_PROVIDER_SITE_OTHER): Payer: BC Managed Care – PPO | Admitting: Legal Medicine

## 2021-02-10 ENCOUNTER — Ambulatory Visit (INDEPENDENT_AMBULATORY_CARE_PROVIDER_SITE_OTHER): Payer: BC Managed Care – PPO | Admitting: Cardiothoracic Surgery

## 2021-02-10 ENCOUNTER — Encounter: Payer: Self-pay | Admitting: Legal Medicine

## 2021-02-10 ENCOUNTER — Encounter: Payer: Self-pay | Admitting: Cardiothoracic Surgery

## 2021-02-10 VITALS — BP 135/91 | HR 94 | Temp 98.2°F | Resp 20 | Wt 189.0 lb

## 2021-02-10 VITALS — BP 100/68 | HR 92 | Temp 97.5°F | Resp 16 | Ht 69.0 in | Wt 189.0 lb

## 2021-02-10 DIAGNOSIS — Z9889 Other specified postprocedural states: Secondary | ICD-10-CM

## 2021-02-10 DIAGNOSIS — L02411 Cutaneous abscess of right axilla: Secondary | ICD-10-CM

## 2021-02-10 DIAGNOSIS — Z9049 Acquired absence of other specified parts of digestive tract: Secondary | ICD-10-CM

## 2021-02-10 NOTE — Progress Notes (Signed)
[address]       [place] [address]             [phone]      Thomas Gonzalez [place] Medical Record #[id number] Date of Birth: 1966/05/19  Referring: [name], MD Primary Care: Lillard Anes, MD Primary Cardiologist: No primary care provider on file. Medical Oncology: Dr Argentina Donovan  Chief Complaint:   POST OP FOLLOW UP OPERATIVE REPORT DATE OF PROCEDURE:  09/20/2020 PREOPERATIVE DIAGNOSIS:  Adenocarcinoma of the distal esophagus, gastroesophageal junction. POSTOPERATIVE DIAGNOSIS:  Adenocarcinoma of the distal esophagus, gastroesophageal junction. SURGICAL PROCEDURE:  Video bronchoscopy, transhiatal total esophagectomy with cervical esophagogastrotomy, pyloroplasty, placement of feeding jejunostomy tube, removal of PEG tube.  Cancer Staging Esophageal carcinoma [facility]) Staging form: Esophagus - Adenocarcinoma, AJCC 8th Edition - Clinical stage from 09/02/2020: Stage Unknown (cTX, cN2, cM0, G3) - Signed by Grace Isaac, MD on 09/02/2020 - Pathologic stage from 09/23/2020: Stage I (ypT2, pN0, cM0, G2) - Signed by Grace Isaac, MD on 09/23/2020  History of Present Illness:     Patient returns to the office for follow up  postop visit after e following transhiatal total esophagectomy on November 1.   Postoperative wound probed and separated-he is currently been doing wet-to-dry saline dressings  The patient's main problem at this point is healing of his abdominal incision-he has been seen by Dr. Marla Roe -last week she debrided the wound and myriad sheet was placed on the wound and layered as    Past Medical History:  Diagnosis Date  . Bowel obstruction ([place])    06/21/2017 reported- "years ago"  . Cancer ([place])    basal cell face  . GERD (gastroesophageal reflux disease)   . History of kidney stones    x 2     Social History   Tobacco Use  Smoking Status Former Smoker  . Years: 0.20  Smokeless Tobacco Former  Systems developer  Tobacco Comment   quit in [place] History   Substance and Sexual Activity  Alcohol Use No   Comment: in high school- not since     Allergies  Allergen Reactions  . Penicillins Anaphylaxis  . Prednisone Anxiety    Mood swings    Current Outpatient Medications  Medication Sig Dispense Refill  . acetaminophen (TYLENOL) 325 MG tablet Take 2 tablets (650 mg total) by mouth every 6 (six) hours as needed.    Marland Kitchen ascorbic acid (VITAMIN C) 500 MG tablet Take 500 mg by mouth daily.    . cholecalciferol (VITAMIN D3) 25 MCG (1000 UNIT) tablet Take 1,000 Units by mouth daily.    Marland Kitchen lidocaine (LIDODERM) 5 % USE 1 PATCH EXTERNALLY ONCE DAILY AS NEEDED FOR PAIN. APPLY TOPICALLY TO INTACT SKIN OVER AREA OF GREATEST PAIN. YOU MAY CUT TO SIZE MAXIMUM    . metoprolol tartrate (LOPRESSOR) 25 MG tablet Take 0.5 tablets (12.5 mg total) by mouth 2 (two) times daily. 30 tablet 1  . omeprazole (PRILOSEC) 40 MG capsule Take 40 mg by mouth every morning.    . ondansetron (ZOFRAN) 4 MG tablet Take 1 tablet (4 mg total) by mouth every 4 (four) hours as needed for nausea. 90 tablet 3  . prochlorperazine (COMPAZINE) 10 MG tablet Take 1 tablet (10 mg total) by mouth every 6 (six) hours as needed for nausea or vomiting. 90 tablet 3  . sulfamethoxazole-trimethoprim (BACTRIM DS) 800-160 MG tablet Take 1 tablet by mouth 2 (two)  times daily. 14 tablet 2  . zinc gluconate 50 MG tablet Take 50 mg by mouth daily.     No current facility-administered medications for this visit.       Physical Exam: BP (!) 135/91 (BP Location: Left Arm, Patient Position: Sitting, Cuff Size: Normal)   Pulse 94   Temp 98.2 F (36.8 C) (Skin)   Resp 20   Wt 189 lb (85.7 kg)   SpO2 94% Comment: RA  BMI 27.91 kg/m  General appearance: alert and cooperative Neck: no adenopathy, no carotid bruit, no JVD, supple, symmetrical, trachea midline and thyroid not enlarged, symmetric, no tenderness/mass/nodules Lymph nodes:  Cervical, supraclavicular, and axillary nodes normal. Resp: clear to auscultation bilaterally Cardio: regular rate and rhythm, S1, S2 normal, no murmur, click, rub or gallop Extremities: extremities normal, atraumatic, no cyanosis or edema Neurologic: Grossly normal Wound was inspected dressing was intact and sutured in place-additional K-Y jelly and 4 x 4's were placed, none of wound appeared infected appeared infected  Diagnostic Studies & Laboratory data:     Recent Radiology Findings:  Narrative & Impression  CLINICAL DATA:  Esophageal carcinoma  EXAM: CHEST - 2 VIEW  COMPARISON:  09/29/2020  FINDINGS: Right Port-A-Cath remains in place, unchanged. Left basilar subsegmental atelectasis with small left effusion. Right lung clear. Heart is normal size. No acute bony abnormality.  IMPRESSION: Small left pleural effusion with left base atelectasis.   Electronically Signed   By: Rolm Baptise M.D.   On: 10/12/2020 15:44    Recent Lab Findings: Lab Results  Component Value Date   WBC 8.5 02/01/2021   HGB 15.6 02/01/2021   HCT 46 02/01/2021   PLT 240 02/01/2021   GLUCOSE 126 (H) 10/06/2020   ALT 34 02/01/2021   AST 28 02/01/2021   NA 136 (A) 02/01/2021   K 4.7 02/01/2021   CL 103 02/01/2021   CREATININE 0.7 02/01/2021   BUN 27 (A) 02/01/2021   CO2 26 (A) 02/01/2021   TSH 0.623 02/01/2021   INR 1.0 09/16/2020      Assessment / Plan:   #1 status post transhiatal total esophagectomy with cervical esophagogastrostomy-with preop radiation and chemo found to be a pathologic stage I adenocarcinoma (ypT2, pN0, cM0, G2 He is currently being treated by monthly infusion adjuvant nivolumab immunotherapy for 1 year  #2 abdominal incision infection/herniation-prior to the patient being evaluated by the thoracic surgery service.  PEG tube was placed right in the middle of the upper abdomen-was never used but obviously contributed to development of wound infection #3  patient continues with wound care as outlined by Dr. Marla Roe   The patient will return to the surgical office in 6 weeks  Medication Changes: No orders of the defined types were placed in this encounter.     Grace Isaac MD      [address] [place] [place] [address] Office [phone]     02/10/2021 4:56 PM

## 2021-02-10 NOTE — Progress Notes (Signed)
Subjective:  Patient ID: Thomas Gonzalez, male    DOB: [birth year]  Age: 55 y.o. MRN: [id number]  Chief Complaint  Patient presents with  . Wound Check    HPI: follow up on I & D, culture positive for staph aureus.  It is healing well   Current Outpatient Medications on File Prior to Visit  Medication Sig Dispense Refill  . acetaminophen (TYLENOL) 325 MG tablet Take 2 tablets (650 mg total) by mouth every 6 (six) hours as needed.    Marland Kitchen ascorbic acid (VITAMIN C) 500 MG tablet Take 500 mg by mouth daily.    . cholecalciferol (VITAMIN D3) 25 MCG (1000 UNIT) tablet Take 1,000 Units by mouth daily.    Marland Kitchen lidocaine (LIDODERM) 5 % USE 1 PATCH EXTERNALLY ONCE DAILY AS NEEDED FOR PAIN. APPLY TOPICALLY TO INTACT SKIN OVER AREA OF GREATEST PAIN. YOU MAY CUT TO SIZE MAXIMUM    . metoprolol tartrate (LOPRESSOR) 25 MG tablet Take 0.5 tablets (12.5 mg total) by mouth 2 (two) times daily. 30 tablet 1  . omeprazole (PRILOSEC) 40 MG capsule Take 40 mg by mouth every morning.    . ondansetron (ZOFRAN) 4 MG tablet Take 1 tablet (4 mg total) by mouth every 4 (four) hours as needed for nausea. 90 tablet 3  . prochlorperazine (COMPAZINE) 10 MG tablet Take 1 tablet (10 mg total) by mouth every 6 (six) hours as needed for nausea or vomiting. 90 tablet 3  . sulfamethoxazole-trimethoprim (BACTRIM DS) 800-160 MG tablet Take 1 tablet by mouth 2 (two) times daily. 14 tablet 2  . zinc gluconate 50 MG tablet Take 50 mg by mouth daily.     No current facility-administered medications on file prior to visit.   Past Medical History:  Diagnosis Date  . Bowel obstruction ([place])    06/21/2017 reported- "years ago"  . Cancer ([place])    basal cell face  . GERD (gastroesophageal reflux disease)   . History of kidney stones    x 2   Past Surgical History:  Procedure Laterality Date  . APPENDECTOMY  2019  . APPLICATION OF A-CELL OF EXTREMITY N/A 01/27/2021   Procedure: APPLICATION OF MYRIAD OR MatriDerm;  Surgeon:  Wallace Going, DO;  Location: [place];  Service: Plastics;  Laterality: N/A;  . COMPLETE ESOPHAGECTOMY N/A 09/20/2020   Procedure: Transhiatal total ESOPHAGECTOMY with Pyloroplasty;  Surgeon: Grace Isaac, MD;  Location: [place];  Service: Thoracic;  Laterality: N/A;  . HERNIA REPAIR Bilateral 2007,2010,2020   Inguinial   . INCISION AND DRAINAGE OF WOUND N/A 01/27/2021   Procedure: Debridement of abdominal wound;  Surgeon: Wallace Going, DO;  Location: [place];  Service: Plastics;  Laterality: N/A;  45 min  . JEJUNOSTOMY N/A 09/20/2020   Procedure: Shanon Rosser;  Surgeon: Grace Isaac, MD;  Location: [place];  Service: Thoracic;  Laterality: N/A;  . KNEE ARTHROSCOPY Left 06/27/2017   Procedure: ARTHROSCOPY KNEE;  Surgeon: Dorna Leitz, MD;  Location: [place];  Service: Orthopedics;  Laterality: Left;  . REMOVAL OF GASTROSTOMY TUBE  09/20/2020   Procedure: REMOVAL OF GASTROSTOMY TUBE;  Surgeon: Grace Isaac, MD;  Location: [place];  Service: Thoracic;;  . VIDEO BRONCHOSCOPY N/A 09/20/2020   Procedure: VIDEO BRONCHOSCOPY;  Surgeon: Grace Isaac, MD;  [facility];  Service: Thoracic;  Laterality: N/A;    Family History  Family history unknown: Yes   Social History   Socioeconomic History  . Marital status:  Married    Spouse name: Not on file  . Number of children: 2  . Years of education: Not on file  . Highest education level: Not on file  Occupational History    Employer: [employer]  Tobacco Use  . Smoking status: Former Smoker    Years: 0.20  . Smokeless tobacco: Former Systems developer  . Tobacco comment: quit in 1985  Vaping Use  . Vaping Use: Never used  Substance and Sexual Activity  . Alcohol use: No    Comment: in high school- not since  . Drug use: No  . Sexual activity: Yes  Other Topics Concern  . Not on file  Social History Narrative  . Not on file   Social Determinants of Health   Financial  Resource Strain: Not on file  Food Insecurity: Not on file  Transportation Needs: Not on file  Physical Activity: Not on file  Stress: Not on file  Social Connections: Not on file    Review of Systems  Constitutional: Negative.   Respiratory: Negative.   Cardiovascular: Negative for chest pain, palpitations and leg swelling.  Gastrointestinal: Negative for abdominal distention and abdominal pain.  Genitourinary: Negative.   Musculoskeletal: Negative for arthralgias and back pain.  Skin: Positive for wound.     Objective:  BP 100/68   Pulse 92   Temp (!) 97.5 F (36.4 C)   Resp 16   Ht 5' 9"  (1.753 m)   Wt 189 lb (85.7 kg)   SpO2 98%   BMI 27.91 kg/m   BP/Weight 02/10/2021 02/08/2021 6/80/3212  Systolic BP 248 250 037  Diastolic BP 68 81 82  Wt. (Lbs) 189 - 191  BMI 27.91 - 28.21    Physical Exam Vitals reviewed.  Constitutional:      Appearance: Normal appearance.  Cardiovascular:     Rate and Rhythm: Normal rate and regular rhythm.     Pulses: Normal pulses.     Heart sounds: Normal heart sounds.  Pulmonary:     Effort: Pulmonary effort is normal.     Breath sounds: Normal breath sounds.  Skin:    Comments: 1cm healing I % D clean.  Neurological:     Mental Status: He is alert.       Lab Results  Component Value Date   WBC 8.5 02/01/2021   HGB 15.6 02/01/2021   HCT 46 02/01/2021   PLT 240 02/01/2021   GLUCOSE 126 (H) 10/06/2020   ALT 34 02/01/2021   AST 28 02/01/2021   NA 136 (A) 02/01/2021   K 4.7 02/01/2021   CL 103 02/01/2021   CREATININE 0.7 02/01/2021   BUN 27 (A) 02/01/2021   CO2 26 (A) 02/01/2021   TSH 0.623 02/01/2021   INR 1.0 09/16/2020      Assessment & Plan:  Diagnoses and all orders for this visit: Abscess of right axilla  abscess healing well, keep dry and use bandaid        I spent <15 minutes dedicated to the care of this patient on the date of this encounter to include face-to-face time with the patient, as well  as:   Follow-up: Return if symptoms worsen or fail to improve.  An After Visit Summary was printed and given to the patient.  [name], MD [facility] [phone]

## 2021-02-16 ENCOUNTER — Telehealth: Payer: Self-pay | Admitting: Oncology

## 2021-02-16 NOTE — Telephone Encounter (Signed)
02/16/21 spoke with patient and scheduled ct scans

## 2021-02-17 ENCOUNTER — Telehealth: Payer: Self-pay | Admitting: Plastic Surgery

## 2021-02-17 NOTE — Telephone Encounter (Signed)
Patient had surgery on 03/10 and patch/graft over wound and over the last couple of days, he has noticed some swelling of his stomach around the wound area. Should he be concerned or be seen? Please call him to advise.  [phone]

## 2021-02-17 NOTE — Telephone Encounter (Signed)
Spoke with patient, he reports he is doing well. He denies f/c/n/v. He denies any changes to the wound other than swelling. He is having normal Bms. Reports he feels good otherwise and has no other complaints or changes to his health. We discussed swelling after surgery is possible and this is possibly what is causing this. We discussed that I can evaluate the area on 02/22/21, his next appointment with [place]. Recommend he call with questions or concerns. Recommend calling us if his symptoms change or worsen. He agrees.

## 2021-02-22 ENCOUNTER — Other Ambulatory Visit: Payer: Self-pay

## 2021-02-22 ENCOUNTER — Ambulatory Visit (INDEPENDENT_AMBULATORY_CARE_PROVIDER_SITE_OTHER): Payer: BC Managed Care – PPO | Admitting: Surgical

## 2021-02-22 ENCOUNTER — Encounter: Payer: Self-pay | Admitting: Surgical

## 2021-02-22 VITALS — BP 120/84 | HR 91

## 2021-02-22 DIAGNOSIS — S31109D Unspecified open wound of abdominal wall, unspecified quadrant without penetration into peritoneal cavity, subsequent encounter: Secondary | ICD-10-CM | POA: Diagnosis not present

## 2021-02-22 NOTE — Progress Notes (Signed)
Referring Provider Lillard Anes, MD [address] [place],  [place] [address]   CC:  Chief Complaint  Patient presents with  . Post-op Follow-up      Thomas Gonzalez is an 55 y.o. male.  HPI: Patient is a 55 year old male here for follow-up after undergoing surgery on his abdomen on 01/27/2021.  He is approximately 1 month postop. He is here with his wife.  He reports that he has had some swelling of his abdomen over the past week or so.  He reports that he is overall feeling normal, he denies any fevers, chills, nausea, vomiting, chest pain, shortness of breath, melena, hematochezia, change in his bowel movements, dysuria, poor appetite.  He reports that he feels fine and he has not noticed any changes in his health status other than his swelling of his abdomen.  He reports that he is scheduled to see oncology next week for a CT scan to track the progress of his esophageal carcinoma.  He has been doing well with dressing changes at home  Review of Systems General: No fevers, chills, nausea, vomiting As mentioned in HPI.   Physical Exam Vitals with BMI 02/22/2021 02/10/2021 02/10/2021  Height - - 5\' 9"   Weight - 189 lbs 189 lbs  BMI - 35.5 73.2  Systolic 202 542 706  Diastolic 84 91 68  Pulse 91 94 92  Vital signs and nursing note reviewed General:  No acute distress,  Alert and oriented, Non-Toxic, Normal speech and affect Constitutional: Normal appearance Cardiovascular: Normal rate Mental status: He is alert, mental status is at baseline.  Mood and affect are normal. Abdomen: Abdominal wound is present, good base of granulation tissue noted, he has some wound matrix still in place along the superior aspect of the wound.  He has new epithelialization noted at the wound edges.  There is no foul odors.  No surrounding erythema.  He does have some abdominal swelling, I do not feel any subcutaneous fluid.  I do not feel any areas of fluctuance.  There is no  tenderness with palpation.  No rebound tenderness.  No pulsatile masses are noted with palpation.  Positive distention  Assessment/Plan  55 year old male with a complex medical history including esophageal carcinoma status post transhiatal total esophagectomy with cervical esophagogastrostomy and pyloroplasty.  He was sent to our office for management of his abdominal wound.  He subsequently underwent debridement of the abdominal wound and placement of wound matrix on 01/27/2021.  He is approximately 1 month postop.  He has noticed some swelling of his abdomen, reports that this is happened in the past and upon review of EMR photos he has appeared distended previously.  I discussed with the patient that I do not feel as if this is associated with his surgical intervention, I do not see any signs of infection.  I do not feel any areas of fluid collection.  He is scheduled for CT scan in 1 week and is going to be evaluated by oncology for follow-up in regards to esophageal cancer.  I discussed with the patient that if symptoms worsen or if he has any new symptoms that are concerning to be evaluated in the emergency room.  In regards to his abdominal wound, recommend dressing changes with endoform.  We discussed the wound care protocol with endoform and all of their questions were answered to their content.  I recommend he follow-up in 2 to 3 weeks for reevaluation.  There is no  sign of infection, seroma or hematoma.  Pictures were obtained of the patient and placed in the chart with the patient's or guardian's permission.   Thomas Gonzalez 02/22/2021, 12:19 PM

## 2021-02-24 ENCOUNTER — Encounter: Payer: Self-pay | Admitting: Oncology

## 2021-02-28 DIAGNOSIS — I700 Atherosclerosis of aorta: Secondary | ICD-10-CM | POA: Diagnosis not present

## 2021-02-28 DIAGNOSIS — C159 Malignant neoplasm of esophagus, unspecified: Secondary | ICD-10-CM | POA: Diagnosis not present

## 2021-02-28 DIAGNOSIS — Z9049 Acquired absence of other specified parts of digestive tract: Secondary | ICD-10-CM | POA: Diagnosis not present

## 2021-02-28 DIAGNOSIS — I2510 Atherosclerotic heart disease of native coronary artery without angina pectoris: Secondary | ICD-10-CM | POA: Diagnosis not present

## 2021-02-28 NOTE — Progress Notes (Signed)
[facility]  [address] [place],  [place]  [address] [phone]  Clinic Day:  4/12//2022  Referring physician: Lillard Anes,*   HISTORY OF PRESENT ILLNESS:  The patient is a 55 y.o. 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  He comes in today to be evaluated before heading into his 5th cycle of maintenance nivolumab.  He also comes in today to go over his CT scans to ensure there remains no evidence of disease recurrence.  Since his last visit, the patient has been doing well.  He continues to tolerate his nivolumab immunotherapy very well without having any significant side effects.  He denies having any dysphagia, abdominal discomfort or other GI symptoms which concern him for early disease recurrence.   However, he has noticed that the midline of his abdomen is protruding more than it ever has.    With respect to his esophageal cancer, he completed neoadjuvant chemoradiation, which consisted of 3 cycles of FOLFOX chemotherapy.  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  Pathology from this surgery showed persistent adenocarcinoma which extended into the muscularis propria level of his stomach.  Based upon this, he is undergoing adjuvant nivolumab immunotherapy for 1 year.   PHYSICAL EXAM:  Blood pressure 125/86, pulse (!) 50, temperature 98.8 F (37.1 C), resp. rate 16, height 5\' 9"  (1.753 m), weight 192 lb (87.1 kg), SpO2 96 %. Wt Readings from Last 3 Encounters:  03/01/21 192 lb (87.1 kg)  02/10/21 189 lb (85.7 kg)  02/10/21 189 lb (85.7 kg)   Body mass index is 28.35 kg/m. Performance status (ECOG): 1 - Symptomatic but completely ambulatory Physical Exam Constitutional:      Appearance: Normal appearance. He is not ill-appearing.  HENT:     Mouth/Throat:     Mouth: Mucous membranes are moist.     Pharynx: Oropharynx is clear. No oropharyngeal exudate or  posterior oropharyngeal erythema.  Cardiovascular:     Rate and Rhythm: Normal rate and regular rhythm.     Heart sounds: No murmur heard. No friction rub. No gallop.   Pulmonary:     Effort: Pulmonary effort is normal. No respiratory distress.     Breath sounds: Normal breath sounds. No wheezing, rhonchi or rales.  Chest:  Breasts:     Right: No axillary adenopathy or supraclavicular adenopathy.     Left: No axillary adenopathy or supraclavicular adenopathy.    Abdominal:     General: Bowel sounds are normal. There is no distension.     Palpations: Abdomen is soft. There is no mass.     Tenderness: There is no abdominal tenderness.     Hernia: A hernia (midline abdominal protrusion) is present.  Musculoskeletal:        General: No swelling.     Right lower leg: No edema.     Left lower leg: No edema.  Lymphadenopathy:     Cervical: No cervical adenopathy.     Upper Body:     Right upper body: No supraclavicular or axillary adenopathy.     Left upper body: No supraclavicular or axillary adenopathy.     Lower Body: No right inguinal adenopathy. No left inguinal adenopathy.  Skin:    General: Skin is warm.     Coloration: Skin is not jaundiced.     Findings: No lesion or rash.  Neurological:     General: No focal deficit present.  Mental Status: He is alert and oriented to person, place, and time. Mental status is at baseline.     Cranial Nerves: Cranial nerves are intact.  Psychiatric:        Mood and Affect: Mood normal.        Behavior: Behavior normal.        Thought Content: Thought content normal.   SCANS:  CT scans of his chest/abdomen/pelvis revealed the following: FINDINGS: CT CHEST FINDINGS  Cardiovascular: Port in the anterior chest wall with tip in distal SVC. Coronary artery calcification and aortic atherosclerotic calcification.  Mediastinum/Nodes: A gastric pull-up anatomy. No mediastinal adenopathy. Trachea normal.  No axillary supraclavicular  adenopathy.  Lungs/Pleura: Small 2 mm peripheral nodule in the LEFT upper lobe (image 59/10) nodule is unchanged.  RIGHT middle lobe nodule measuring 5 mm (image 71/4) is unchanged.  Musculoskeletal: No aggressive osseous lesion.  CT ABDOMEN AND PELVIS FINDINGS  Hepatobiliary: No focal hepatic lesion. No biliary ductal dilatation. Gallbladder is normal. Common bile duct is normal.  Pancreas: Pancreas is normal. No ductal dilatation. No pancreatic inflammation.  Spleen: Normal spleen  Adrenals/urinary tract: Adrenal glands and proximal ureters normal  Stomach/Bowel: Esophagectomy and gastric pull-up anatomy. No abnormality of the stomach. Review of the small bowel: Unremarkable.  Vascular/Lymphatic: No upper abdominal adenopathy. Normal caliber aorta  Musculoskeletal: No aggressive osseous lesion.  IMPRESSION: Chest Impression:  1. Post esophagectomy with gastric pull-up anatomy. No evidence of local recurrence. 2. No evidence of mediastinal nodal metastasis. 3. Small bilateral pulmonary nodules are unchanged. Recommend routine surveillance.  Abdomen / Pelvis Impression:  1. No upper abdominal metastasis.  LABS:     ASSESSMENT & PLAN:  Assessment/Plan:  A 54 y.o. 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  In clinic today, I went over all of his CT scan images with him, for which he could see there remains no evidence of disease recurrence.  His midline abdominal protrusion appears to be due to weakened midline abdominal muscles.  Hopefully, this will improve over time.  Otherwise, as he is doing well, he will proceed with his 5th cycle of adjuvant nivolumab immunotherapy this week, which is being given at 480 mg once every 4 weeks.  Clinically, the patient appears to be dong well.  I will see him back in 4 weeks before he heads into his 6th cycle of adjuvant nivolumab immunotherapy. The patient understands all the plans discussed  today and is in agreement with them.    Bobak Oguinn Macarthur Critchley, MD

## 2021-03-01 ENCOUNTER — Inpatient Hospital Stay: Payer: BC Managed Care – PPO | Attending: Oncology | Admitting: Oncology

## 2021-03-01 ENCOUNTER — Other Ambulatory Visit: Payer: Self-pay

## 2021-03-01 ENCOUNTER — Telehealth: Payer: Self-pay | Admitting: Oncology

## 2021-03-01 ENCOUNTER — Other Ambulatory Visit: Payer: Self-pay | Admitting: Oncology

## 2021-03-01 VITALS — BP 125/86 | HR 50 | Temp 98.8°F | Resp 16 | Ht 69.0 in | Wt 192.0 lb

## 2021-03-01 DIAGNOSIS — Z79899 Other long term (current) drug therapy: Secondary | ICD-10-CM | POA: Insufficient documentation

## 2021-03-01 DIAGNOSIS — C159 Malignant neoplasm of esophagus, unspecified: Secondary | ICD-10-CM | POA: Diagnosis not present

## 2021-03-01 DIAGNOSIS — C160 Malignant neoplasm of cardia: Secondary | ICD-10-CM | POA: Insufficient documentation

## 2021-03-01 DIAGNOSIS — I700 Atherosclerosis of aorta: Secondary | ICD-10-CM | POA: Insufficient documentation

## 2021-03-01 DIAGNOSIS — Z5112 Encounter for antineoplastic immunotherapy: Secondary | ICD-10-CM | POA: Insufficient documentation

## 2021-03-01 NOTE — Progress Notes (Signed)
Patient states that he has had some abdominal bloating around the surgical site where PEG was removed.

## 2021-03-01 NOTE — Telephone Encounter (Signed)
Per 4/12 los next appt scheduled and given to patient 

## 2021-03-03 ENCOUNTER — Inpatient Hospital Stay: Payer: BC Managed Care – PPO

## 2021-03-03 ENCOUNTER — Other Ambulatory Visit: Payer: Self-pay

## 2021-03-03 VITALS — BP 130/83 | HR 93 | Temp 98.3°F | Resp 18 | Wt 186.0 lb

## 2021-03-03 DIAGNOSIS — C160 Malignant neoplasm of cardia: Secondary | ICD-10-CM | POA: Diagnosis not present

## 2021-03-03 DIAGNOSIS — I700 Atherosclerosis of aorta: Secondary | ICD-10-CM | POA: Diagnosis not present

## 2021-03-03 DIAGNOSIS — C159 Malignant neoplasm of esophagus, unspecified: Secondary | ICD-10-CM

## 2021-03-03 DIAGNOSIS — Z5112 Encounter for antineoplastic immunotherapy: Secondary | ICD-10-CM | POA: Diagnosis not present

## 2021-03-03 DIAGNOSIS — Z79899 Other long term (current) drug therapy: Secondary | ICD-10-CM | POA: Diagnosis not present

## 2021-03-03 MED ORDER — SODIUM CHLORIDE 0.9% FLUSH
10.0000 mL | INTRAVENOUS | Status: DC | PRN
  Administered 2021-03-03: 10 mL
  Filled 2021-03-03: qty 10

## 2021-03-03 MED ORDER — SODIUM CHLORIDE 0.9 % IV SOLN
Freq: Once | INTRAVENOUS | Status: AC
Start: 2021-03-03 — End: 2021-03-03
  Filled 2021-03-03: qty 250

## 2021-03-03 MED ORDER — SODIUM CHLORIDE 0.9 % IV SOLN
480.0000 mg | Freq: Once | INTRAVENOUS | Status: AC
  Administered 2021-03-03: 480 mg via INTRAVENOUS
  Filled 2021-03-03: qty 48

## 2021-03-03 MED ORDER — HEPARIN SOD (PORK) LOCK FLUSH 100 UNIT/ML IV SOLN
500.0000 [IU] | Freq: Once | INTRAVENOUS | Status: AC | PRN
  Administered 2021-03-03: 500 [IU]
  Filled 2021-03-03: qty 5

## 2021-03-03 NOTE — Patient Instructions (Signed)
Nivolumab injection What is this medicine? NIVOLUMAB (nye VOL ue mab) is a monoclonal antibody. It treats certain types of cancer. Some of the cancers treated are colon cancer, head and neck cancer, Hodgkin lymphoma, lung cancer, and melanoma. This medicine may be used for other purposes; ask your health care provider or pharmacist if you have questions. COMMON BRAND NAME(S): Opdivo What should I tell my health care provider before I take this medicine? They need to know if you have any of these conditions:  autoimmune diseases like Crohn's disease, ulcerative colitis, or lupus  have had or planning to have an allogeneic stem cell transplant (uses someone else's stem cells)  history of chest radiation  history of organ transplant  nervous system problems like myasthenia gravis or Guillain-Barre syndrome  an unusual or allergic reaction to nivolumab, other medicines, foods, dyes, or preservatives  pregnant or trying to get pregnant  breast-feeding How should I use this medicine? This medicine is for infusion into a vein. It is given by a health care professional in a hospital or clinic setting. A special MedGuide will be given to you before each treatment. Be sure to read this information carefully each time. Talk to your pediatrician regarding the use of this medicine in children. While this drug may be prescribed for children as young as 12 years for selected conditions, precautions do apply. Overdosage: If you think you have taken too much of this medicine contact a poison control center or emergency room at once. NOTE: This medicine is only for you. Do not share this medicine with others. What if I miss a dose? It is important not to miss your dose. Call your doctor or health care professional if you are unable to keep an appointment. What may interact with this medicine? Interactions have not been studied. This list may not describe all possible interactions. Give your health  care provider a list of all the medicines, herbs, non-prescription drugs, or dietary supplements you use. Also tell them if you smoke, drink alcohol, or use illegal drugs. Some items may interact with your medicine. What should I watch for while using this medicine? This drug may make you feel generally unwell. Continue your course of treatment even though you feel ill unless your doctor tells you to stop. You may need blood work done while you are taking this medicine. Do not become pregnant while taking this medicine or for 5 months after stopping it. Women should inform their doctor if they wish to become pregnant or think they might be pregnant. There is a potential for serious side effects to an unborn child. Talk to your health care professional or pharmacist for more information. Do not breast-feed an infant while taking this medicine or for 5 months after stopping it. What side effects may I notice from receiving this medicine? Side effects that you should report to your doctor or health care professional as soon as possible:  allergic reactions like skin rash, itching or hives, swelling of the face, lips, or tongue  breathing problems  blood in the urine  bloody or watery diarrhea or black, tarry stools  changes in emotions or moods  changes in vision  chest pain  cough  dizziness  feeling faint or lightheaded, falls  fever, chills  headache with fever, neck stiffness, confusion, loss of memory, sensitivity to light, hallucination, loss of contact with reality, or seizures  joint pain  mouth sores  redness, blistering, peeling or loosening of the skin, including inside the   mouth  severe muscle pain or weakness  signs and symptoms of high blood sugar such as dizziness; dry mouth; dry skin; fruity breath; nausea; stomach pain; increased hunger or thirst; increased urination  signs and symptoms of kidney injury like trouble passing urine or change in the amount of  urine  signs and symptoms of liver injury like dark yellow or brown urine; general ill feeling or flu-like symptoms; light-colored stools; loss of appetite; nausea; right upper belly pain; unusually weak or tired; yellowing of the eyes or skin  swelling of the ankles, feet, hands  trouble passing urine or change in the amount of urine  unusually weak or tired  weight gain or loss Side effects that usually do not require medical attention (report to your doctor or health care professional if they continue or are bothersome):  bone pain  constipation  decreased appetite  diarrhea  muscle pain  nausea, vomiting  tiredness This list may not describe all possible side effects. Call your doctor for medical advice about side effects. You may report side effects to FDA at 1-800-FDA-1088. Where should I keep my medicine? This drug is given in a hospital or clinic and will not be stored at home. NOTE: This sheet is a summary. It may not cover all possible information. If you have questions about this medicine, talk to your doctor, pharmacist, or health care provider.  2021 Elsevier/Gold Standard (2020-03-10 10:08:25)  

## 2021-03-08 ENCOUNTER — Ambulatory Visit (INDEPENDENT_AMBULATORY_CARE_PROVIDER_SITE_OTHER): Payer: BC Managed Care – PPO | Admitting: Surgical

## 2021-03-08 ENCOUNTER — Other Ambulatory Visit: Payer: Self-pay

## 2021-03-08 ENCOUNTER — Encounter: Payer: Self-pay | Admitting: Surgical

## 2021-03-08 VITALS — BP 129/74 | HR 89

## 2021-03-08 DIAGNOSIS — S31109D Unspecified open wound of abdominal wall, unspecified quadrant without penetration into peritoneal cavity, subsequent encounter: Secondary | ICD-10-CM

## 2021-03-08 DIAGNOSIS — Z736 Limitation of activities due to disability: Secondary | ICD-10-CM

## 2021-03-08 NOTE — Progress Notes (Signed)
   Referring Provider Lillard Anes, MD [address] [place],  [place] [address]   CC:  Chief Complaint  Patient presents with  . Follow-up      Thomas Gonzalez is an 55 y.o. male.  HPI: Patient is a 55 year old male here for follow-up after undergoing surgery on his abdomen on 01/27/2021.  He is 6 weeks postop.  He recently saw oncology and had a CT scan of his abdomen.  Everything appears stable per oncology note.  Patient continues to receive adjuvant nivolumab immunotherapy.  He will receive this for 1 year.   He reports today that he is doing well.  He reports his wife is not feeling well today.  He reports that he has been doing the endoform dressing changes.  Review of Systems General: No fevers or chills  Physical Exam Vitals with BMI 03/08/2021 03/03/2021 03/01/2021  Height - - 5\' 9"   Weight - 186 lbs 192 lbs  BMI - 79.48 01.65  Systolic 537 482 707  Diastolic 74 83 86  Pulse 89 93 50    General:  No acute distress,  Alert and oriented, Non-Toxic, Normal speech and affect Abdomen: No surrounding erythema noted.  Wound is 11 x 4.5 cm at the widest point.  There is no fibrinous exudate noted.  The wound matrix has completely incorporated.  There is some surrounding skin irritation from tape.      Assessment/Plan Recommend continue with endoform dressing changes.  I discussed with the patient to never remove the actual endoform sheet from the wound as this creates a scaffold to help with healing.  I discussed with him that if he notices that the endoform is incorporating quickly that he can add another sheet over top for improved healing.  I recommend calling with questions or concerns.  I recommend a follow-up in 3 weeks for reevaluation.  There is no sign of infection, seroma, hematoma.  I did discuss with him that with his history of radiation to the surrounding area he has a decreased healing rate.  Picture was taken and placed in the patient's  chart with his permission.  Carola Rhine Rafael Salway 03/08/2021, 10:29 AM

## 2021-03-21 ENCOUNTER — Encounter: Payer: Self-pay | Admitting: Oncology

## 2021-03-25 ENCOUNTER — Ambulatory Visit: Payer: BC Managed Care – PPO

## 2021-03-25 IMAGING — CR DG CHEST 1V PORT
1 series · 1 of 1 positions shown · non-contrast
Comparison: Earlier same day

CLINICAL DATA: Post esophagectomy

EXAM:
PORTABLE CHEST 1 VIEW

[AP]
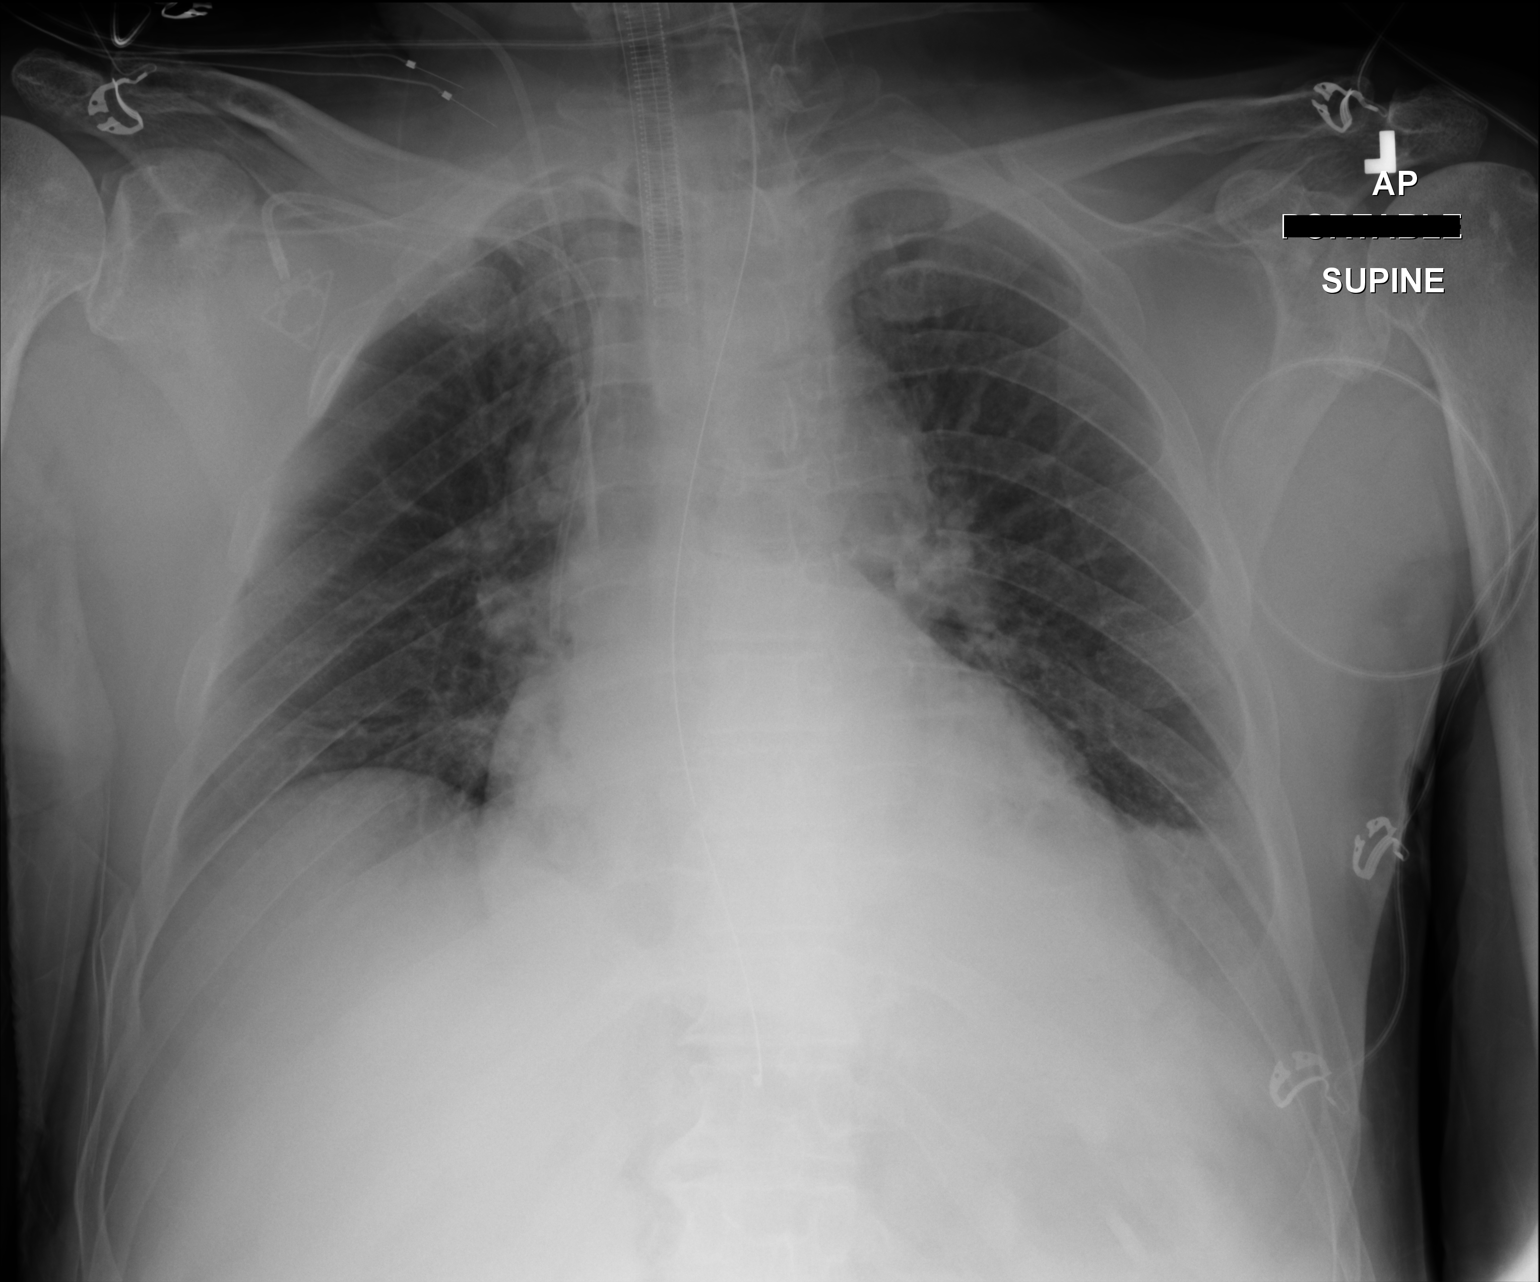

[1 of 1 positions shown; findings below may reference images not displayed]

FINDINGS: Endotracheal tube is approximately 5 cm above the carina. Right IJ
central line tip overlies SVC. Right chest wall port catheter is
unchanged. Enteric tube is likely within gastric pull-through given
history.

No pneumothorax. Probable small bilateral pleural effusions.
Bibasilar atelectasis. Normal heart size.
IMPRESSION: Lines and tubes as above. No pneumothorax. Probable small bilateral
pleural effusions with bibasilar atelectasis.

## 2021-03-26 IMAGING — DX DG CHEST 1V PORT
1 series · 1 of 1 positions shown · non-contrast
Comparison: 09/20/2020.

CLINICAL DATA: Weakness.  Status post esophagectomy.

EXAM:
PORTABLE CHEST 1 VIEW

[chest]
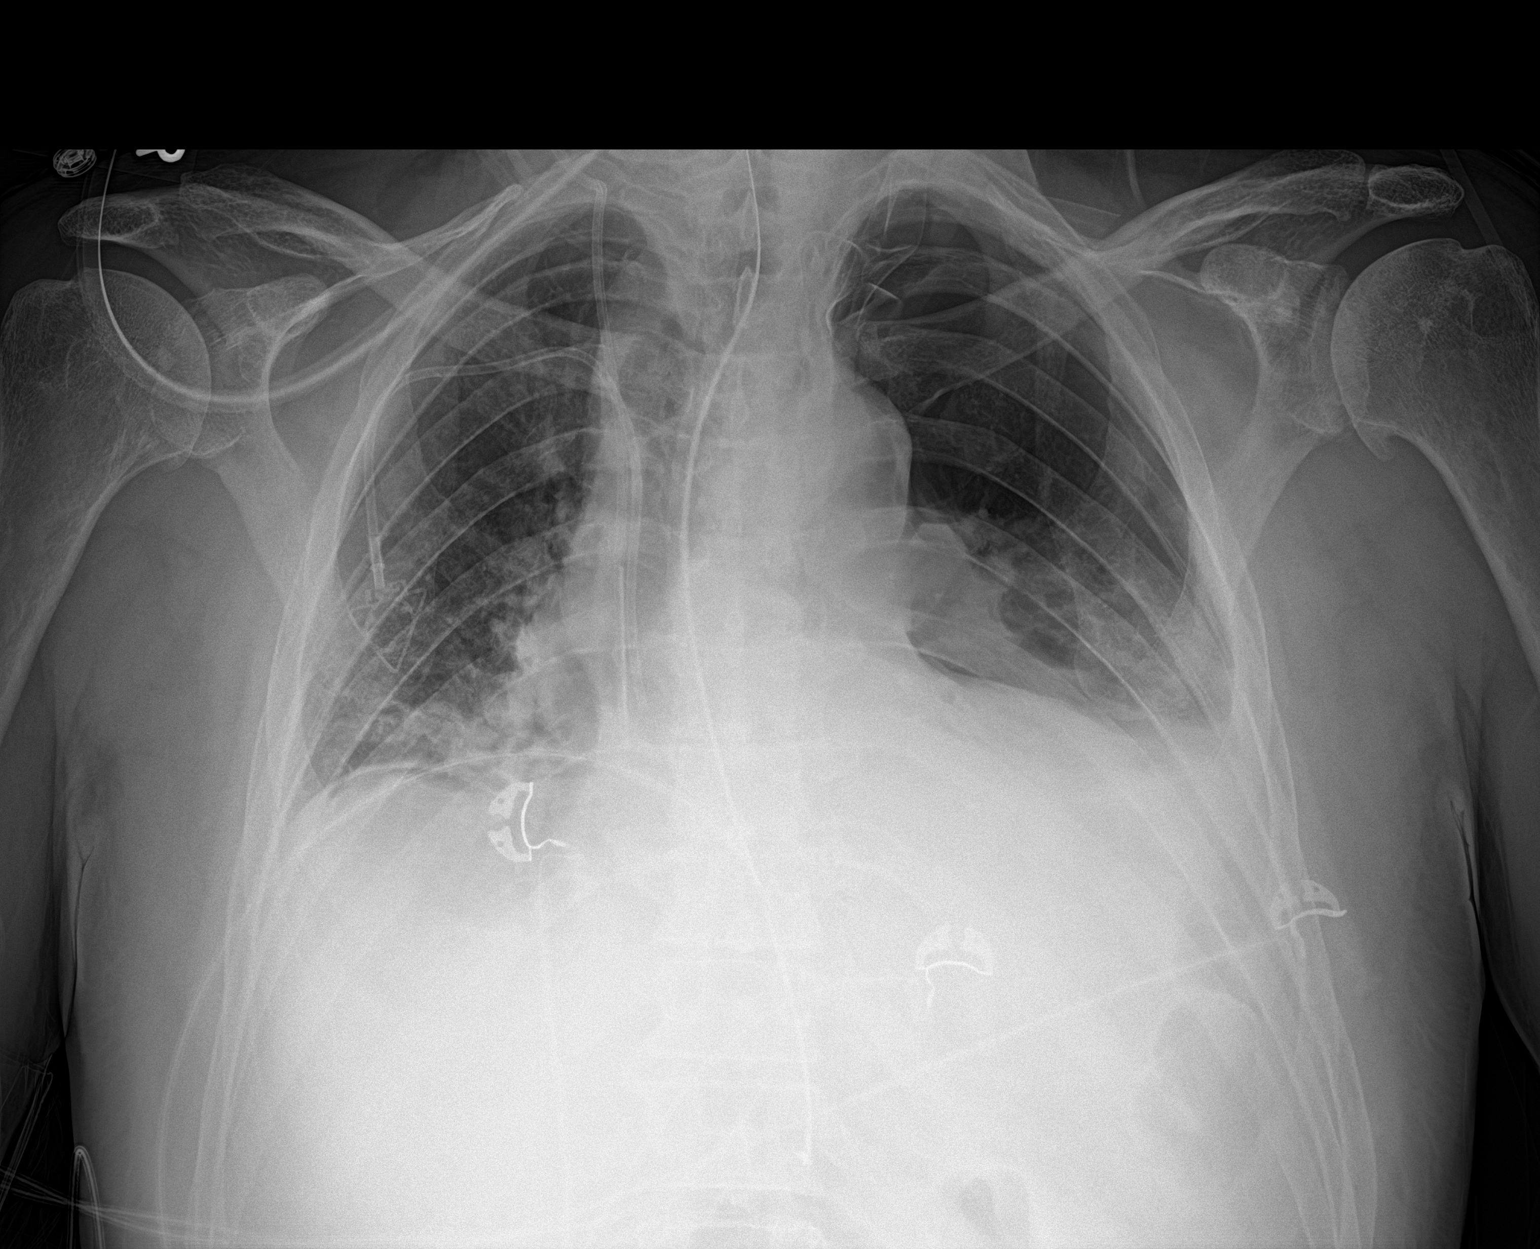

[1 of 1 positions shown; findings below may reference images not displayed]

FINDINGS: Interim removal of endotracheal tube. NG tube, right IJ line, right
PowerPort catheter in stable position. Heart size stable.
Progressive bibasilar atelectasis/infiltrates. Small left pleural
effusion. Probable small left apical pneumothorax. Small amount of
free air under the right hemidiaphragm cannot be excluded. This
could be from recent surgery.
IMPRESSION: 1. Interim removal of endotracheal tube.
2. NG tube, right IJ line right PowerPort catheter in stable
position.
3. Progressive bibasilar atelectasis/infiltrates. Small left pleural
effusion.
4. Probable small left apical pneumothorax. Small amount of air
under the right hemidiaphragm. This could be from recent surgery.

Critical Value/emergent results were called by telephone at the time
of interpretation on 09/21/2020 at [DATE] to nurse Pryce, who
verbally acknowledged these results.

## 2021-03-27 IMAGING — DX DG CHEST 1V PORT
1 series · 1 of 1 positions shown · non-contrast
Comparison: 09/21/2020.

CLINICAL DATA: Esophagectomy, chest soreness.

EXAM:
PORTABLE CHEST 1 VIEW

[chest ap]
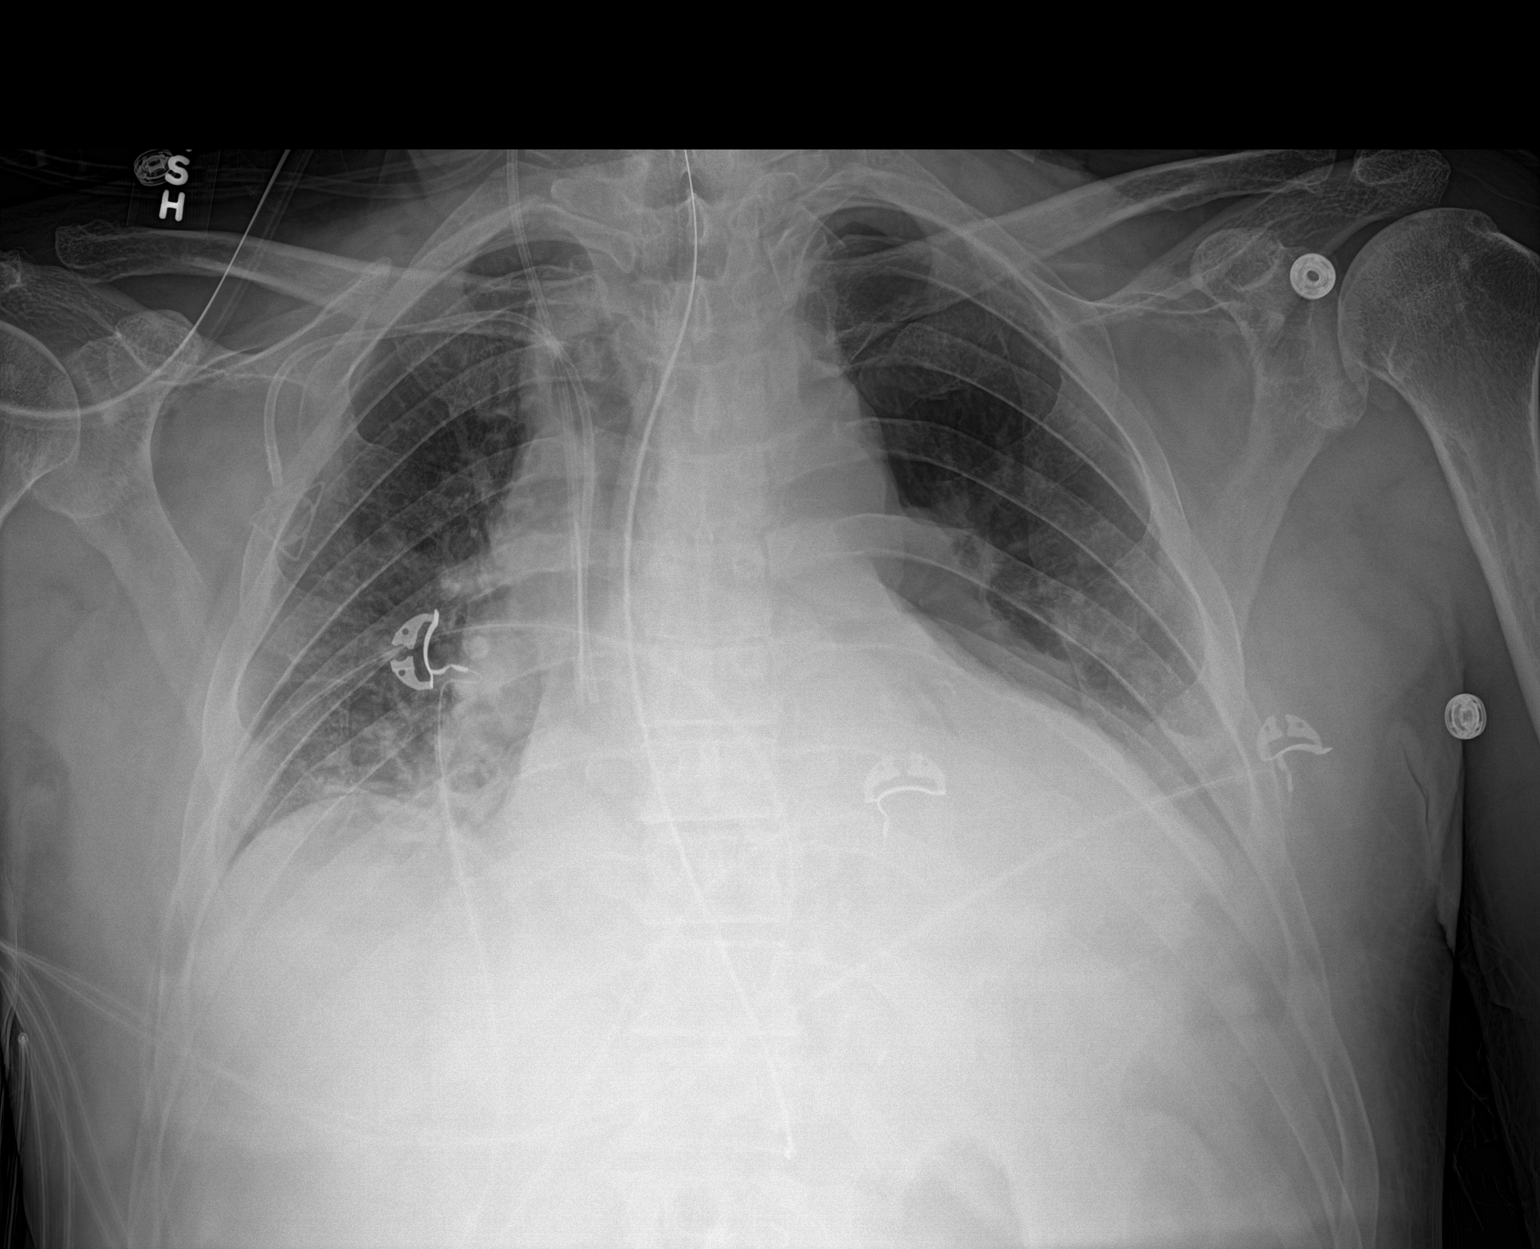

[1 of 1 positions shown; findings below may reference images not displayed]

FINDINGS: Esophagectomy. Nasogastric tube traverses the gastric pull-through,
terminating in the epigastric region. Right IJ central line and
right-sided power port tips terminate in the low SVC.

Heart is enlarged, stable. Lungs are somewhat low in volume with
bibasilar airspace opacification, left greater than right. Lucency
is seen along the left heart border and likely left apex. Small left
pleural effusion. Probable subcutaneous emphysema along the lower
chest wall bilaterally. Lucency beneath the right hemidiaphragm,
seen on yesterday's exam, is less well appreciated currently.
IMPRESSION: 1. Moderate left hydropneumothorax, stable.
2. Bibasilar airspace opacification, left greater than right, likely
due to atelectasis.

## 2021-03-28 IMAGING — DX DG CHEST 1V PORT
1 series · 1 of 1 positions shown · non-contrast
Comparison: 09/22/2020.

CLINICAL DATA: Sore chest status post esophagectomy.

EXAM:
PORTABLE CHEST 1 VIEW

[chest ap]
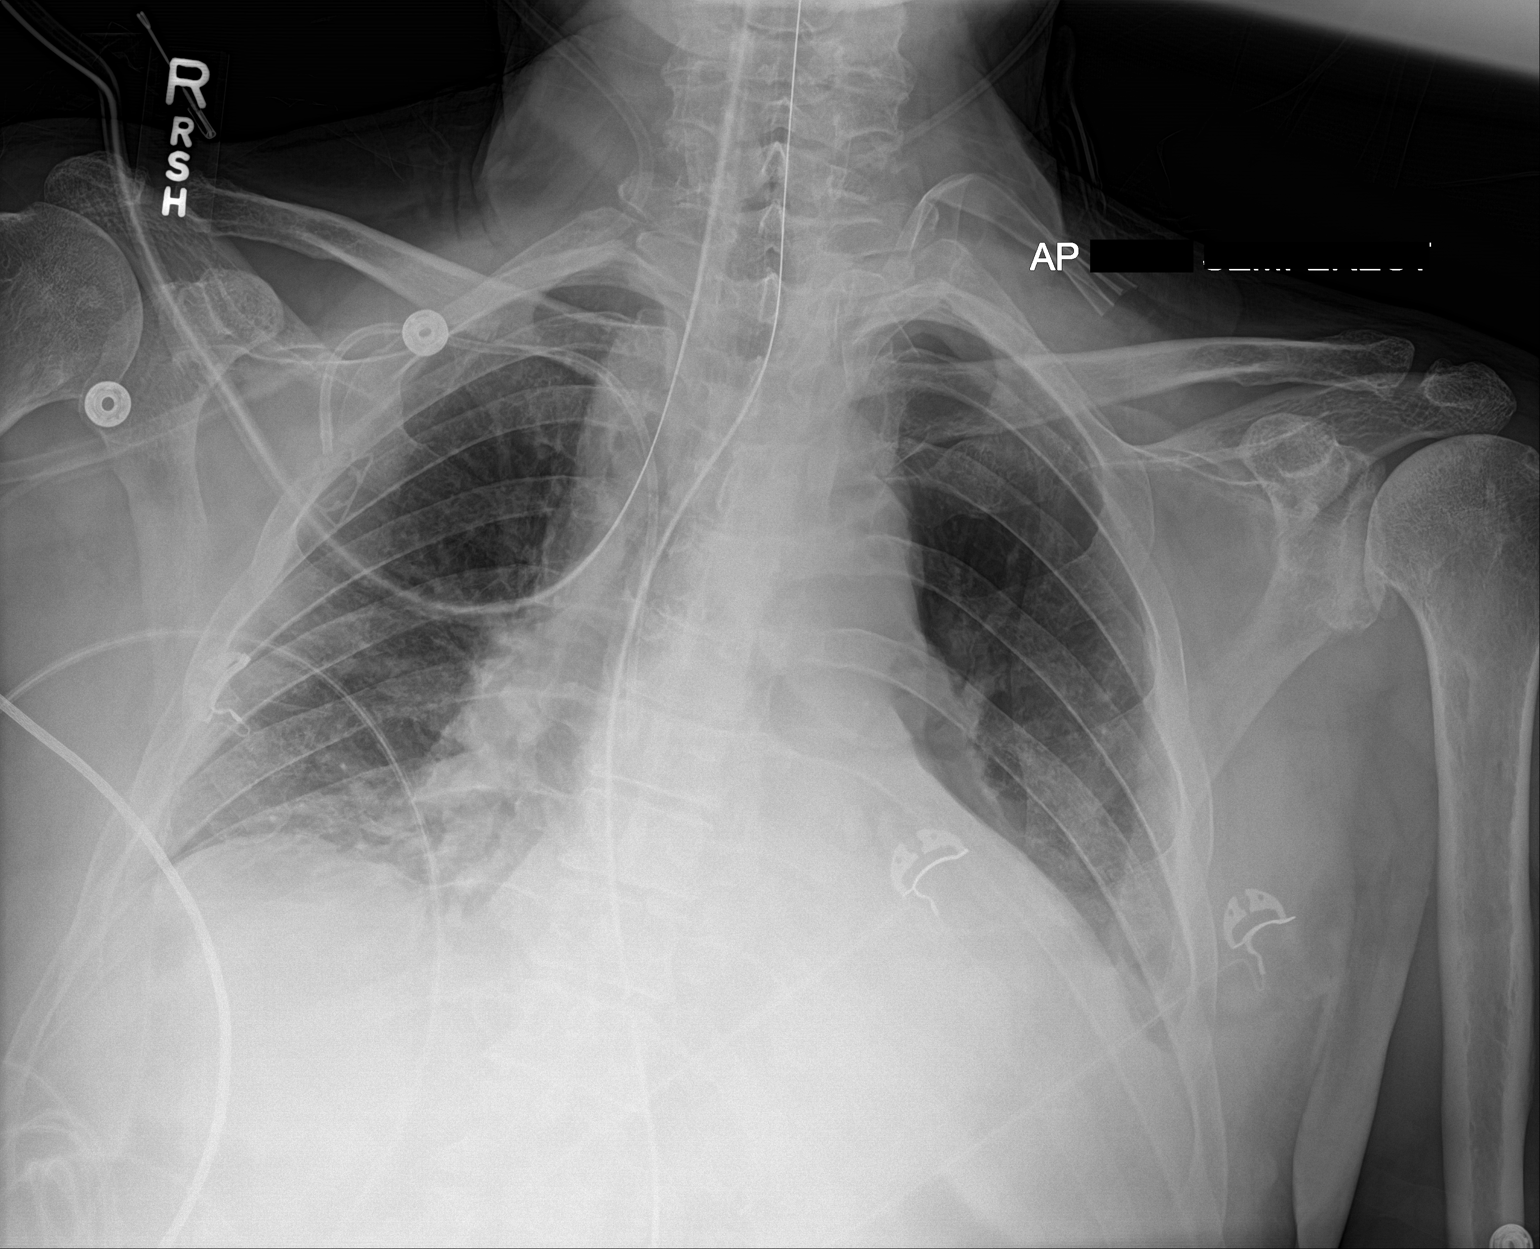

[1 of 1 positions shown; findings below may reference images not displayed]

FINDINGS: Interim removal of right IJ line. PowerPort catheter stable
position. NG tube stable position. Surgical drainage left upper
chest. Stable cardiomegaly. Persistent bibasilar atelectasis, left
side greater than right again noted. Tiny left pleural effusion
cannot be excluded. No definite pneumothorax noted on today's exam.
IMPRESSION: 1. Interim removal of right IJ line. PowerPort catheter and NG tube
stable position.
2. No definite pneumothorax noted on today's exam.
3. Persistent bibasilar atelectasis, left side greater than right.

## 2021-03-28 NOTE — Progress Notes (Signed)
[facility]  [address] [place],  [place]  [address] [phone]  Clinic Day:  05/10//2022  Referring physician: Lillard Anes,*  This document serves as a record of services personally performed by [name], MD. It was created on their behalf by [facility] E, a trained medical scribe. The creation of this record is based on the scribe's personal observations and the provider's statements to them.  HISTORY OF PRESENT ILLNESS:  The patient is a 55 y.o. 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  He comes in today to be evaluated before heading into his 6th cycle of maintenance nivolumab.  Since his last visit, the patient has been doing well.  He continues to tolerate his nivolumab immunotherapy very well without having any significant side effects.  He denies having any dysphagia, abdominal discomfort or other GI symptoms which concern him for early disease recurrence.     With respect to his esophageal cancer treatment history, he completed neoadjuvant chemoradiation, which consisted of 3 cycles of FOLFOX chemotherapy.  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  Pathology from this surgery showed persistent adenocarcinoma which extended into the muscularis propria level of his stomach.  Based upon this, he is undergoing adjuvant nivolumab immunotherapy for 1 year.   PHYSICAL EXAM:  Blood pressure 132/90, pulse 88, temperature 98.9 F (37.2 C), resp. rate 16, height 5\' 9"  (1.753 m), weight 191 lb 8 oz (86.9 kg), SpO2 97 %. Wt Readings from Last 3 Encounters:  03/29/21 191 lb 8 oz (86.9 kg)  03/03/21 186 lb (84.4 kg)  03/01/21 192 lb (87.1 kg)   Body mass index is 28.28 kg/m. Performance status (ECOG): 1 - Symptomatic but completely ambulatory Physical Exam Constitutional:      Appearance: Normal appearance. He is not ill-appearing.  HENT:     Mouth/Throat:      Mouth: Mucous membranes are moist.     Pharynx: Oropharynx is clear. No oropharyngeal exudate or posterior oropharyngeal erythema.  Cardiovascular:     Rate and Rhythm: Normal rate and regular rhythm.     Heart sounds: No murmur heard. No friction rub. No gallop.   Pulmonary:     Effort: Pulmonary effort is normal. No respiratory distress.     Breath sounds: Normal breath sounds. No wheezing, rhonchi or rales.  Chest:  Breasts:     Right: No axillary adenopathy or supraclavicular adenopathy.     Left: No axillary adenopathy or supraclavicular adenopathy.    Abdominal:     General: Bowel sounds are normal. There is no distension.     Palpations: Abdomen is soft. There is no mass.     Tenderness: There is no abdominal tenderness.     Hernia: A hernia (midline abdominal protrusion) is present.  Musculoskeletal:        General: No swelling.     Right lower leg: No edema.     Left lower leg: No edema.  Lymphadenopathy:     Cervical: No cervical adenopathy.     Upper Body:     Right upper body: No supraclavicular or axillary adenopathy.     Left upper body: No supraclavicular or axillary adenopathy.     Lower Body: No right inguinal adenopathy. No left inguinal adenopathy.  Skin:    General: Skin is warm.     Coloration: Skin is not jaundiced.     Findings: No lesion or rash.  Neurological:  General: No focal deficit present.     Mental Status: He is alert and oriented to person, place, and time. Mental status is at baseline.     Cranial Nerves: Cranial nerves are intact.  Psychiatric:        Mood and Affect: Mood normal.        Behavior: Behavior normal.        Thought Content: Thought content normal.     LABS:   Hematology 03/29/2021  WBC 8.3  Absolute neutrophils 5.98  RBC 4.76  Hemoglobin 15.5  Hematocrit 45.3  Platelets 273   Chemisty 03/29/2021  Sodium 137  Potassium 4.3  Chloride 103  CO2 25  BUN 25 (A)  Creatinine 0.8  Calcium 8.8  Albumin 4.0  ALT  39  AST 34  Alkaline Phosphatase 128  Total Bilirubin 0.6    ASSESSMENT & PLAN:  Assessment/Plan:  A 55 y.o. 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  He will proceed with his 6th cycle of adjuvant nivolumab immunotherapy this week, which is being given at 480 mg once every 4 weeks.  Clinically, the patient appears to be doing well.  I will see him back in 4 weeks before he heads into his 7th cycle of adjuvant nivolumab immunotherapy. The patient understands all the plans discussed today and is in agreement with them.    I, Rita Ohara, am acting as scribe for [name], MD    I have reviewed this report as typed by the medical scribe, and it is complete and accurate.  Dequincy Macarthur Critchley, MD

## 2021-03-29 ENCOUNTER — Telehealth: Payer: Self-pay | Admitting: Oncology

## 2021-03-29 ENCOUNTER — Inpatient Hospital Stay: Payer: BC Managed Care – PPO | Attending: Oncology | Admitting: Oncology

## 2021-03-29 ENCOUNTER — Inpatient Hospital Stay: Payer: BC Managed Care – PPO

## 2021-03-29 ENCOUNTER — Encounter: Payer: Self-pay | Admitting: Oncology

## 2021-03-29 ENCOUNTER — Other Ambulatory Visit: Payer: Self-pay | Admitting: Oncology

## 2021-03-29 ENCOUNTER — Other Ambulatory Visit: Payer: Self-pay

## 2021-03-29 VITALS — BP 132/90 | HR 88 | Temp 98.9°F | Resp 16 | Ht 69.0 in | Wt 191.5 lb

## 2021-03-29 DIAGNOSIS — C160 Malignant neoplasm of cardia: Secondary | ICD-10-CM | POA: Insufficient documentation

## 2021-03-29 DIAGNOSIS — Z5112 Encounter for antineoplastic immunotherapy: Secondary | ICD-10-CM | POA: Diagnosis not present

## 2021-03-29 DIAGNOSIS — C159 Malignant neoplasm of esophagus, unspecified: Secondary | ICD-10-CM

## 2021-03-29 DIAGNOSIS — Z79899 Other long term (current) drug therapy: Secondary | ICD-10-CM | POA: Insufficient documentation

## 2021-03-29 LAB — COMPREHENSIVE METABOLIC PANEL
Albumin: 4 (ref 3.5–5.0)
Calcium: 8.8 (ref 8.7–10.7)

## 2021-03-29 LAB — TSH: TSH: 0.577 u[IU]/mL (ref 0.350–4.500)

## 2021-03-29 LAB — CBC AND DIFFERENTIAL
HCT: 45 (ref 41–53)
Hemoglobin: 15.5 (ref 13.5–17.5)
Neutrophils Absolute: 5.98
Platelets: 273 (ref 150–399)
WBC: 8.3

## 2021-03-29 LAB — BASIC METABOLIC PANEL
BUN: 25 — AB (ref 4–21)
CO2: 25 — AB (ref 13–22)
Chloride: 103 (ref 99–108)
Creatinine: 0.8 (ref 0.6–1.3)
Glucose: 119
Potassium: 4.3 (ref 3.4–5.3)
Sodium: 137 (ref 137–147)

## 2021-03-29 LAB — HEPATIC FUNCTION PANEL
ALT: 39 (ref 10–40)
AST: 34 (ref 14–40)
Alkaline Phosphatase: 128 — AB (ref 25–125)
Bilirubin, Total: 0.6

## 2021-03-29 LAB — CBC: RBC: 4.76 (ref 3.87–5.11)

## 2021-03-29 NOTE — Telephone Encounter (Signed)
Per 5/10 los next appt scheduled and given to patient 

## 2021-03-30 ENCOUNTER — Ambulatory Visit (INDEPENDENT_AMBULATORY_CARE_PROVIDER_SITE_OTHER): Payer: BC Managed Care – PPO | Admitting: Surgical

## 2021-03-30 ENCOUNTER — Encounter: Payer: Self-pay | Admitting: Surgical

## 2021-03-30 DIAGNOSIS — S31109D Unspecified open wound of abdominal wall, unspecified quadrant without penetration into peritoneal cavity, subsequent encounter: Secondary | ICD-10-CM | POA: Diagnosis not present

## 2021-03-30 IMAGING — DX DG CHEST 1V PORT
1 series · 1 of 1 positions shown · non-contrast
Comparison: 09/23/2020 chest radiograph.

CLINICAL DATA: Chest tube

EXAM:
PORTABLE CHEST 1 VIEW

[chest]
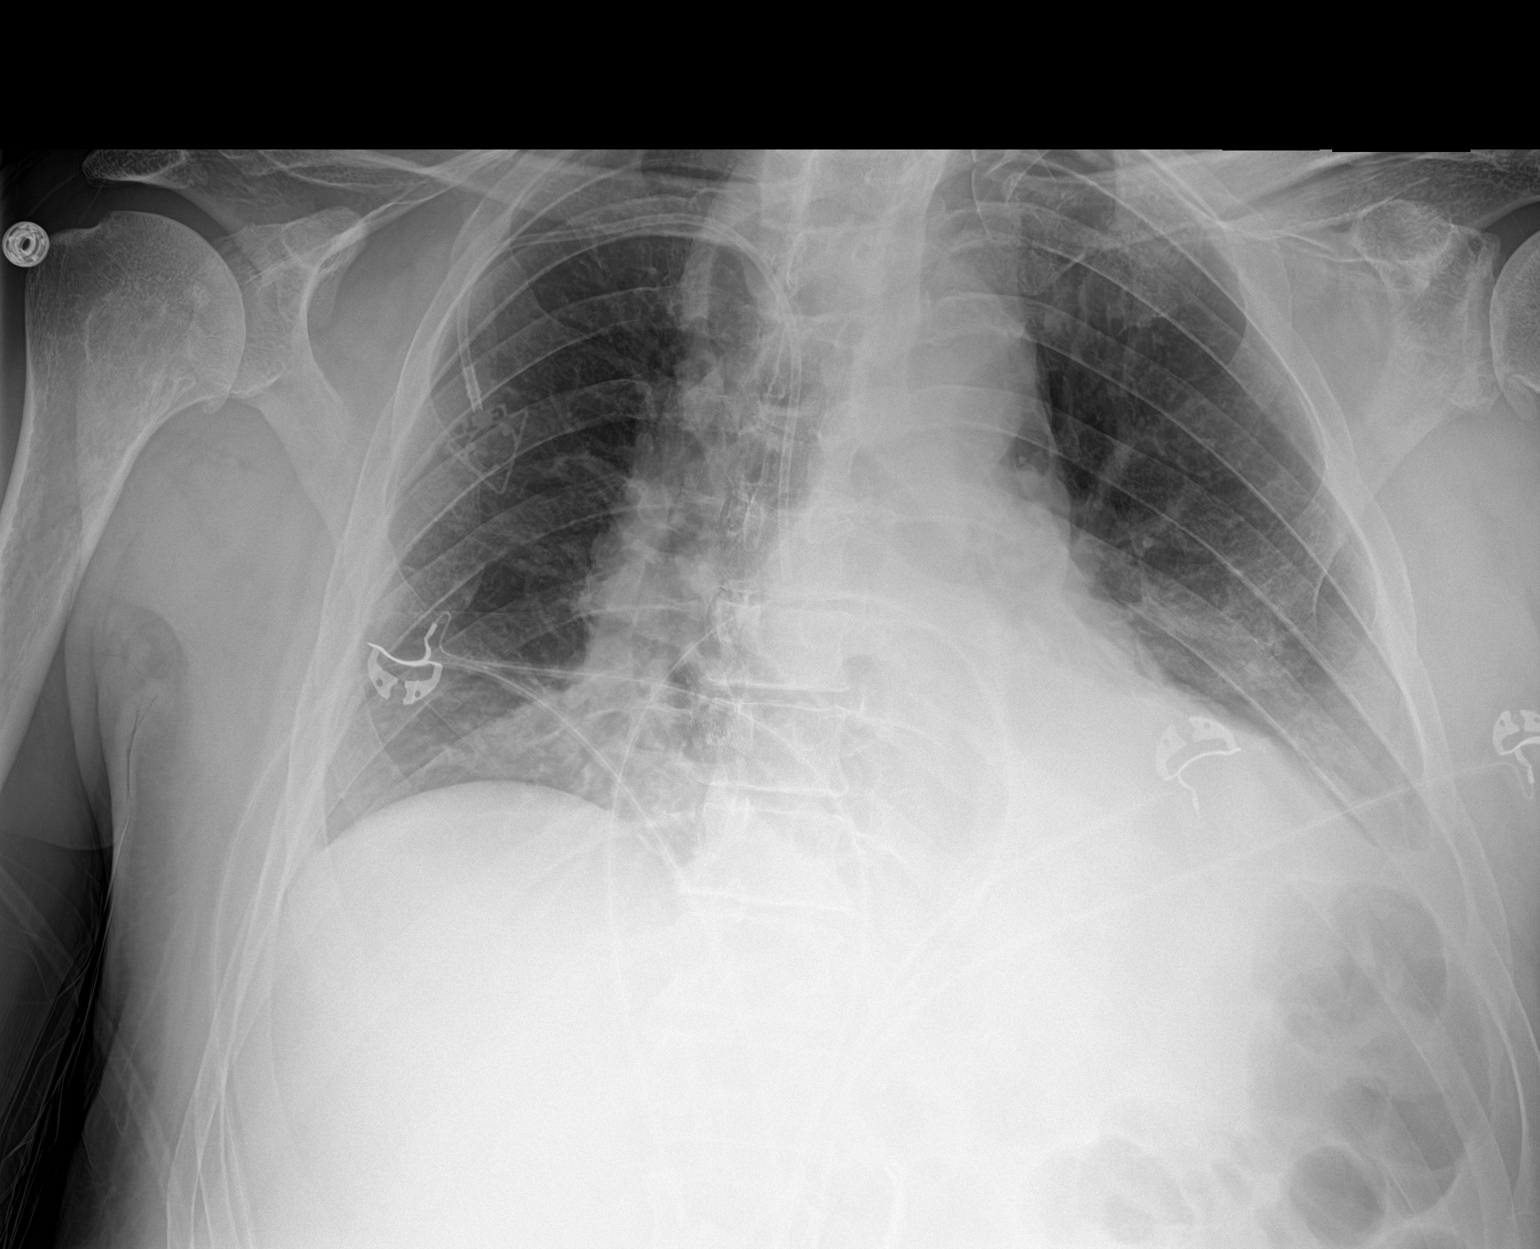

[1 of 1 positions shown; findings below may reference images not displayed]

FINDINGS: Right subclavian Port-A-Cath terminates over the lower third of the
SVC. Stable cardiomediastinal silhouette with normal heart size and
expected postsurgical changes from esophagectomy with gastric
pull-through. Small left apical pneumothorax, less than 5%, slightly
decreased compared to the prior radiograph in retrospect. No right
pneumothorax. Small basilar left pleural effusion is stable. No
right pleural effusion. Patchy bibasilar lung opacities are stable.
No pulmonary edema.
IMPRESSION: 1. Small left apical pneumothorax, less than 5%, slightly decreased
compared to prior radiograph in retrospect.
2. Stable small basilar left pleural effusion.
3. Stable patchy bibasilar lung opacities, favor atelectasis.

## 2021-03-30 NOTE — Progress Notes (Signed)
   Referring Provider Lillard Anes, MD [address] [place],  [place] [address]   CC:  Chief Complaint  Patient presents with  . Follow-up      Thomas Gonzalez is an 55 y.o. male.  HPI: 54 year old male here for follow-up after undergoing surgery on his abdomen on 01/27/2021 with Dr. Marla Roe.  Of note he does have esophageal carcinoma and is seeing oncology for this.  He is currently undergoing immunotherapy with nivolumab.  He is here with his wife today.  He reports that he has not noticed much of a change.  He is not having any infectious symptoms.  He is going for an additional infusion of nivolumab tomorrow.  He has some questions about if the nivolumab could be delaying his healing.  He also has history of radiation to this area.  Review of Systems General: No fevers or chills  Physical Exam Vitals with BMI 03/29/2021 03/08/2021 03/03/2021  Height 5\' 9"  - -  Weight 191 lbs 8 oz - 186 lbs  BMI 01.09 - 32.35  Systolic 573 220 254  Diastolic 90 74 83  Pulse 88 89 93    General:  No acute distress,  Alert and oriented, Non-Toxic, Normal speech and affect Abdomen: Abdominal wall wound is approximately 11 x 4.5 at the widest point.  Good base of granulation tissue noted.  Small amount of new epithelialization noted at the wound edges inferiorly.  No purulence.  No foul odor is noted.  The granulation tissue is not very firm.  It is slightly hyper granulated at the edges.  Assessment/Plan Abdominal wound:  Chemical cautery was used to cauterize the hypergranulation tissue surrounding the wound edges to help decrease the granulation tissue and promote some epithelialization.  I recommend he continue with endoform sheet daily.  If the endoform continues to soften quickly recommend stacking the sheets to help continue with epithelialization.  He is aware that with his history of radiation to the surrounding area he has an increased risk of delayed  healing.  Recommend following up in a few weeks for reevaluation.  We can discuss other options for wound care if there is minimal improvement at that time.  Picture was taken and placed in the patient's chart with patient's permission.  Carola Rhine Laytoya Ion 03/30/2021, 1:28 PM

## 2021-03-31 ENCOUNTER — Other Ambulatory Visit: Payer: Self-pay | Admitting: Oncology

## 2021-03-31 ENCOUNTER — Inpatient Hospital Stay: Payer: BC Managed Care – PPO

## 2021-03-31 ENCOUNTER — Other Ambulatory Visit: Payer: Self-pay

## 2021-03-31 VITALS — BP 139/85 | HR 90 | Temp 98.1°F | Resp 18 | Ht 69.0 in | Wt 190.0 lb

## 2021-03-31 DIAGNOSIS — S31109D Unspecified open wound of abdominal wall, unspecified quadrant without penetration into peritoneal cavity, subsequent encounter: Secondary | ICD-10-CM

## 2021-03-31 DIAGNOSIS — Z79899 Other long term (current) drug therapy: Secondary | ICD-10-CM | POA: Diagnosis not present

## 2021-03-31 DIAGNOSIS — C159 Malignant neoplasm of esophagus, unspecified: Secondary | ICD-10-CM

## 2021-03-31 DIAGNOSIS — C160 Malignant neoplasm of cardia: Secondary | ICD-10-CM | POA: Diagnosis not present

## 2021-03-31 DIAGNOSIS — Z5112 Encounter for antineoplastic immunotherapy: Secondary | ICD-10-CM | POA: Diagnosis not present

## 2021-03-31 LAB — T4: T4, Total: 6.9 ug/dL (ref 4.5–12.0)

## 2021-03-31 MED ORDER — SODIUM CHLORIDE 0.9 % IV SOLN
Freq: Once | INTRAVENOUS | Status: AC
  Filled 2021-03-31: qty 250

## 2021-03-31 MED ORDER — SODIUM CHLORIDE 0.9 % IV SOLN
480.0000 mg | Freq: Once | INTRAVENOUS | Status: AC
  Administered 2021-03-31: 480 mg via INTRAVENOUS
  Filled 2021-03-31: qty 48

## 2021-03-31 MED ORDER — HEPARIN SOD (PORK) LOCK FLUSH 100 UNIT/ML IV SOLN
500.0000 [IU] | Freq: Once | INTRAVENOUS | Status: AC | PRN
  Administered 2021-03-31: 500 [IU]
  Filled 2021-03-31: qty 5

## 2021-03-31 NOTE — Progress Notes (Signed)
1537:PT STABLE AT TIME OF DISCHARGE 

## 2021-04-01 ENCOUNTER — Ambulatory Visit (INDEPENDENT_AMBULATORY_CARE_PROVIDER_SITE_OTHER): Payer: BC Managed Care – PPO | Admitting: Thoracic Surgery (Cardiothoracic Vascular Surgery)

## 2021-04-01 ENCOUNTER — Encounter: Payer: Self-pay | Admitting: Thoracic Surgery (Cardiothoracic Vascular Surgery)

## 2021-04-01 VITALS — BP 121/83 | HR 90 | Resp 20 | Ht 69.0 in | Wt 191.0 lb

## 2021-04-01 DIAGNOSIS — Z5189 Encounter for other specified aftercare: Secondary | ICD-10-CM

## 2021-04-01 DIAGNOSIS — Z09 Encounter for follow-up examination after completed treatment for conditions other than malignant neoplasm: Secondary | ICD-10-CM

## 2021-04-01 DIAGNOSIS — C159 Malignant neoplasm of esophagus, unspecified: Secondary | ICD-10-CM

## 2021-04-01 NOTE — Progress Notes (Signed)
[address]       [place] [address]             [phone]                    Rollen Kirk Gonnella Blain Medical Record #[id number] Date of Birth: 1966-06-01  Referring: [name], MD Primary Care: Lillard Anes, MD Primary Cardiologist: None  Chief Complaint:    Chief Complaint  Patient presents with  . Routine Post Op       . Esophageal Cancer    6 week f/u    History of Present Illness:    Thomas Gonzalez 55 y.o. male who underwent a transhiatal esophagectomy by Dr. Servando Snare in November 2021 which then developed a incisional hernia which is being managed by Dr. Marla Roe.  He comes in after 6 weeks for a wound check.  He has no complaints.  He is receiving immunotherapy from his oncologist with esophageal cancer.  The feeding tube has since been removed and he is tolerating that without difficulty.    Past Medical History:  Diagnosis Date  . Bowel obstruction ([facility])    06/21/2017 reported- "years ago"  . Cancer ([facility])    basal cell face  . GERD (gastroesophageal reflux disease)   . History of kidney stones    x 2    Past Surgical History:  Procedure Laterality Date  . APPENDECTOMY  2019  . APPLICATION OF A-CELL OF EXTREMITY N/A 01/27/2021   Procedure: APPLICATION OF MYRIAD OR MatriDerm;  Surgeon: Wallace Going, DO;  Location: [facility];  Service: Plastics;  Laterality: N/A;  . COMPLETE ESOPHAGECTOMY N/A 09/20/2020   Procedure: Transhiatal total ESOPHAGECTOMY with Pyloroplasty;  Surgeon: Grace Isaac, MD;  Location: [facility];  Service: Thoracic;  Laterality: N/A;  . HERNIA REPAIR Bilateral 2007,2010,2020   Inguinial   . INCISION AND DRAINAGE OF WOUND N/A 01/27/2021   Procedure: Debridement of abdominal wound;  Surgeon: Wallace Going, DO;  Location: [facility];  Service: Plastics;  Laterality: N/A;  45 min  . JEJUNOSTOMY N/A 09/20/2020   Procedure: Shanon Rosser;  Surgeon:  Grace Isaac, MD;  Location: [facility];  Service: Thoracic;  Laterality: N/A;  . KNEE ARTHROSCOPY Left 06/27/2017   Procedure: ARTHROSCOPY KNEE;  Surgeon: Dorna Leitz, MD;  Location: [facility];  Service: Orthopedics;  Laterality: Left;  . REMOVAL OF GASTROSTOMY TUBE  09/20/2020   Procedure: REMOVAL OF GASTROSTOMY TUBE;  Surgeon: Grace Isaac, MD;  Location: [facility];  Service: Thoracic;;  . VIDEO BRONCHOSCOPY N/A 09/20/2020   Procedure: VIDEO BRONCHOSCOPY;  Surgeon: Grace Isaac, MD;  Location: [facility];  Service: Thoracic;  Laterality: N/A;    Family History  Family history unknown: Yes     Social History   Tobacco Use  Smoking Status Former Smoker  . Years: 0.20  Smokeless Tobacco Former Systems developer  Tobacco Comment   quit in [place] History   Substance and Sexual Activity  Alcohol Use No   Comment: in high school- not since     Allergies  Allergen Reactions  . Penicillins Anaphylaxis  . Prednisone Anxiety    Mood swings    Current Outpatient Medications  Medication Sig Dispense Refill  . acetaminophen (TYLENOL) 325 MG tablet Take 2 tablets (650 mg total) by mouth every 6 (six) hours as needed.    Marland Kitchen ascorbic acid (VITAMIN C) 500  MG tablet Take 500 mg by mouth daily.    . cholecalciferol (VITAMIN D3) 25 MCG (1000 UNIT) tablet Take 1,000 Units by mouth daily.    Marland Kitchen omeprazole (PRILOSEC) 40 MG capsule Take 40 mg by mouth every morning.    . ondansetron (ZOFRAN) 4 MG tablet Take 1 tablet (4 mg total) by mouth every 4 (four) hours as needed for nausea. 90 tablet 3  . prochlorperazine (COMPAZINE) 10 MG tablet Take 1 tablet (10 mg total) by mouth every 6 (six) hours as needed for nausea or vomiting. 90 tablet 3  . zinc gluconate 50 MG tablet Take 50 mg by mouth daily.    Marland Kitchen sulfamethoxazole-trimethoprim (BACTRIM DS) 800-160 MG tablet Take 1 tablet by mouth 2 (two) times daily. (Patient not taking: Reported on 04/01/2021) 14 tablet 2   No current facility-administered  medications for this visit.    Review of Systems  Constitutional: Negative.   Respiratory: Negative.   Cardiovascular: Negative.     PHYSICAL EXAMINATION: BP 121/83   Pulse 90   Resp 20   Ht _0  (1.753 m)   Wt 191 lb (86.6 kg)   SpO2 97% Comment: RA  BMI 28.21 kg/m   Physical Exam Constitutional:      General: He is not in acute distress.    Appearance: Normal appearance. He is normal weight.  Cardiovascular:     Rate and Rhythm: Normal rate.  Pulmonary:     Effort: Pulmonary effort is normal.  Abdominal:       Comments: Granulation bed.  The wound is filled in nicely.  Neurological:     Mental Status: He is alert.      Diagnostic Studies & Laboratory data:     Recent Radiology Findings:   No results found.     I have independently reviewed the above radiology studies  and reviewed the findings with the patient.   Recent Lab Findings: Lab Results  Component Value Date   WBC 8.3 03/29/2021   HGB 15.5 03/29/2021   HCT 45 03/29/2021   PLT 273 03/29/2021   GLUCOSE 126 (H) 10/06/2020   ALT 39 03/29/2021   AST 34 03/29/2021   NA 137 03/29/2021   K 4.3 03/29/2021   CL 103 03/29/2021   CREATININE 0.8 03/29/2021   BUN 25 (A) 03/29/2021   CO2 25 (A) 03/29/2021   TSH 0.577 03/29/2021   INR 1.0 09/16/2020         Assessment / Plan:   55 year old male status post transhiatal esophagectomy for stage I T2 N0 M0 adenocarcinoma.  He did develop an incisional hernia, which is being managed by plastic surgery.  He does appear to be filling in nicely and has good granulation bed.  I will defer all wound care to plastic surgery.  He is scheduled to meet with Dr. Marla Roe at the end of the month.  I will see him back as a virtual visit in 6 weeks.      Lajuana Matte 04/01/2021 4:19 PM

## 2021-04-19 ENCOUNTER — Ambulatory Visit (INDEPENDENT_AMBULATORY_CARE_PROVIDER_SITE_OTHER): Payer: BC Managed Care – PPO | Admitting: Surgical

## 2021-04-19 ENCOUNTER — Other Ambulatory Visit: Payer: Self-pay

## 2021-04-19 ENCOUNTER — Encounter: Payer: Self-pay | Admitting: Surgical

## 2021-04-19 DIAGNOSIS — Z923 Personal history of irradiation: Secondary | ICD-10-CM

## 2021-04-19 DIAGNOSIS — S31109D Unspecified open wound of abdominal wall, unspecified quadrant without penetration into peritoneal cavity, subsequent encounter: Secondary | ICD-10-CM | POA: Diagnosis not present

## 2021-04-19 NOTE — Progress Notes (Signed)
   Referring Provider Lillard Anes, MD [address] [place],  [place] [address]   CC:  Chief Complaint  Patient presents with  . Follow-up      Thomas Gonzalez is an 55 y.o. male.  HPI: Patient is a 55 year old male here for follow-up on his abdominal wound.  Of note he has a history of radiation to this area for esophageal carcinoma.  Patient presents today with his wife.  He reports overall he is feeling well.  He reports that he is continuing with his immunotherapy.  He reports that he feels as if he has noticed some improvement.  Review of Systems General: No fevers or chills no nausea or vomiting.  Physical Exam Vitals with BMI 04/01/2021 03/31/2021 03/29/2021  Height 5\' 9"  5\' 9"  5\' 9"   Weight 191 lbs 190 lbs 191 lbs 8 oz  BMI 28.19 06.30 16.01  Systolic 093 235 573  Diastolic 83 85 90  Pulse 90 90 88    General:  No acute distress,  Alert and oriented, Non-Toxic, Normal speech and affect Abdomen: Abdominal wound is 10 x 6.5 cm with hypergranulation tissue noted.  There is no surrounding erythema or cellulitic changes.  He has a little bit of irritation surrounding the wound from the dressing tape.  No foul odor or purulent drainage noted.  New epithelialization is noted at the edges it is very minimal along the inferior and superior border but along the left side he does have a fair amount of improvement that is approximately 8 x 8 mm.      Assessment/Plan  We discussed options today for ongoing local wound care versus split-thickness skin graft.  We discussed the pros and cons of each option.  We also discussed that with skin graft there is no guarantee that the graft would take and with his history of radiation he is at an increased risk of poor take of the graft.  He is very optimistic that he has seen some improvement over the last few weeks and we will plan to continue with wound care at this time and he will think about the option of skin  grafting over the next few weeks.   Recommend continuing with endoform dressing changes.  Recommend calling with questions or concerns.  Picture was taken and placed in the patient's chart with patient's permission.  There is no sign of infection.  Silver nitrate was used to chemically cauterize the hypergranulation tissue to allow improved epithelialization at the edges.   Carola Rhine Daulton Harbaugh 04/19/2021, 3:16 PM

## 2021-04-21 DIAGNOSIS — Z736 Limitation of activities due to disability: Secondary | ICD-10-CM

## 2021-04-22 NOTE — Progress Notes (Signed)
[facility]  [address] [place],  [place]  [address] [phone]  Clinic Day:  04/26/2021  Referring physician: Lillard Anes,*  This document serves as a record of services personally performed by [name], MD. It was created on their behalf by [facility] E, a trained medical scribe. The creation of this record is based on the scribe's personal observations and the provider's statements to them.  HISTORY OF PRESENT ILLNESS:  The patient is a 55 y.o. 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  He comes in today to be evaluated before heading into his 7th cycle of maintenance nivolumab.  Since his last visit, the patient has been doing well.  He continues to tolerate his nivolumab immunotherapy very well without having any significant side effects.  He denies having any dysphagia, abdominal discomfort or other GI symptoms which concern him for early disease recurrence.     With respect to his esophageal cancer treatment history, he completed neoadjuvant chemoradiation, which consisted of 3 cycles of FOLFOX chemotherapy.  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  Pathology from this surgery showed persistent adenocarcinoma which extended into the muscularis propria level of his stomach.  Based upon this, he is undergoing adjuvant nivolumab immunotherapy for 1 year.   PHYSICAL EXAM:  Blood pressure 135/90, pulse 97, temperature 98.7 F (37.1 C), resp. rate 16, height 5\' 9"  (1.753 m), weight 192 lb 9.6 oz (87.4 kg), SpO2 97 %. Wt Readings from Last 3 Encounters:  04/26/21 192 lb 9.6 oz (87.4 kg)  04/01/21 191 lb (86.6 kg)  03/31/21 190 lb (86.2 kg)   Body mass index is 28.44 kg/m. Performance status (ECOG): 1 - Symptomatic but completely ambulatory Physical Exam Constitutional:      Appearance: Normal appearance. He is not ill-appearing.  HENT:     Mouth/Throat:      Mouth: Mucous membranes are moist.     Pharynx: Oropharynx is clear. No oropharyngeal exudate or posterior oropharyngeal erythema.  Cardiovascular:     Rate and Rhythm: Normal rate and regular rhythm.     Heart sounds: No murmur heard. No friction rub. No gallop.   Pulmonary:     Effort: Pulmonary effort is normal. No respiratory distress.     Breath sounds: Normal breath sounds. No wheezing, rhonchi or rales.  Chest:  Breasts:     Right: No axillary adenopathy or supraclavicular adenopathy.     Left: No axillary adenopathy or supraclavicular adenopathy.    Abdominal:     General: Bowel sounds are normal. There is no distension.     Palpations: Abdomen is soft. There is no mass.     Tenderness: There is no abdominal tenderness.     Hernia: A hernia (midline abdominal protrusion) is present.  Musculoskeletal:        General: No swelling.     Right lower leg: No edema.     Left lower leg: No edema.  Lymphadenopathy:     Cervical: No cervical adenopathy.     Upper Body:     Right upper body: No supraclavicular or axillary adenopathy.     Left upper body: No supraclavicular or axillary adenopathy.     Lower Body: No right inguinal adenopathy. No left inguinal adenopathy.  Skin:    General: Skin is warm.     Coloration: Skin is not jaundiced.     Findings: No lesion or rash.  Neurological:  General: No focal deficit present.     Mental Status: He is alert and oriented to person, place, and time. Mental status is at baseline.     Cranial Nerves: Cranial nerves are intact.  Psychiatric:        Mood and Affect: Mood normal.        Behavior: Behavior normal.        Thought Content: Thought content normal.     LABS:      ASSESSMENT & PLAN:  Assessment/Plan:  A 55 y.o. 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  He will proceed with his 7th cycle of adjuvant nivolumab immunotherapy this week, which is being given at 480 mg once every 4  weeks.  Clinically, the patient appears to be doing very well.  I will see him back in 4 weeks before he heads into his 8th cycle of adjuvant nivolumab immunotherapy. The patient understands all the plans discussed today and is in agreement with them.     I, Rita Ohara, am acting as scribe for [name], MD    I have reviewed this report as typed by the medical scribe, and it is complete and accurate.  Dequincy Macarthur Critchley, MD

## 2021-04-26 ENCOUNTER — Inpatient Hospital Stay: Payer: BC Managed Care – PPO | Attending: Oncology | Admitting: Oncology

## 2021-04-26 ENCOUNTER — Other Ambulatory Visit: Payer: Self-pay | Admitting: Oncology

## 2021-04-26 ENCOUNTER — Other Ambulatory Visit: Payer: Self-pay

## 2021-04-26 ENCOUNTER — Telehealth: Payer: Self-pay | Admitting: Oncology

## 2021-04-26 ENCOUNTER — Inpatient Hospital Stay: Payer: BC Managed Care – PPO

## 2021-04-26 ENCOUNTER — Encounter: Payer: Self-pay | Admitting: Oncology

## 2021-04-26 VITALS — BP 135/90 | HR 97 | Temp 98.7°F | Resp 16 | Ht 69.0 in | Wt 192.6 lb

## 2021-04-26 DIAGNOSIS — Z5112 Encounter for antineoplastic immunotherapy: Secondary | ICD-10-CM | POA: Insufficient documentation

## 2021-04-26 DIAGNOSIS — C159 Malignant neoplasm of esophagus, unspecified: Secondary | ICD-10-CM | POA: Diagnosis not present

## 2021-04-26 DIAGNOSIS — Z79899 Other long term (current) drug therapy: Secondary | ICD-10-CM | POA: Diagnosis not present

## 2021-04-26 DIAGNOSIS — C160 Malignant neoplasm of cardia: Secondary | ICD-10-CM | POA: Diagnosis not present

## 2021-04-26 DIAGNOSIS — S31109A Unspecified open wound of abdominal wall, unspecified quadrant without penetration into peritoneal cavity, initial encounter: Secondary | ICD-10-CM | POA: Diagnosis not present

## 2021-04-26 LAB — COMPREHENSIVE METABOLIC PANEL
Albumin: 3.9 (ref 3.5–5.0)
Calcium: 8.6 — AB (ref 8.7–10.7)

## 2021-04-26 LAB — CBC AND DIFFERENTIAL
HCT: 46 (ref 41–53)
Hemoglobin: 15.4 (ref 13.5–17.5)
Neutrophils Absolute: 6.08
Platelets: 255 (ref 150–399)
WBC: 8.1

## 2021-04-26 LAB — CBC: RBC: 4.76 (ref 3.87–5.11)

## 2021-04-26 LAB — BASIC METABOLIC PANEL
BUN: 24 — AB (ref 4–21)
CO2: 24 — AB (ref 13–22)
Chloride: 106 (ref 99–108)
Creatinine: 0.6 (ref 0.6–1.3)
Glucose: 132
Potassium: 4.2 (ref 3.4–5.3)
Sodium: 137 (ref 137–147)

## 2021-04-26 LAB — HEPATIC FUNCTION PANEL
ALT: 39 (ref 10–40)
AST: 35 (ref 14–40)
Alkaline Phosphatase: 126 — AB (ref 25–125)
Bilirubin, Total: 0.6

## 2021-04-26 LAB — TSH: TSH: 0.387 u[IU]/mL (ref 0.350–4.500)

## 2021-04-26 NOTE — Telephone Encounter (Signed)
Per 6/7 los next appt scheduled and given to patient 

## 2021-04-27 ENCOUNTER — Other Ambulatory Visit: Payer: Self-pay | Admitting: Oncology

## 2021-04-28 ENCOUNTER — Encounter: Payer: Self-pay | Admitting: Oncology

## 2021-04-28 ENCOUNTER — Other Ambulatory Visit: Payer: Self-pay

## 2021-04-28 ENCOUNTER — Inpatient Hospital Stay: Payer: BC Managed Care – PPO

## 2021-04-28 DIAGNOSIS — Z79899 Other long term (current) drug therapy: Secondary | ICD-10-CM | POA: Diagnosis not present

## 2021-04-28 DIAGNOSIS — Z5112 Encounter for antineoplastic immunotherapy: Secondary | ICD-10-CM | POA: Diagnosis not present

## 2021-04-28 DIAGNOSIS — C159 Malignant neoplasm of esophagus, unspecified: Secondary | ICD-10-CM | POA: Diagnosis not present

## 2021-04-28 DIAGNOSIS — C160 Malignant neoplasm of cardia: Secondary | ICD-10-CM | POA: Diagnosis not present

## 2021-04-28 LAB — T4: T4, Total: 6 ug/dL (ref 4.5–12.0)

## 2021-04-28 MED ORDER — SODIUM CHLORIDE 0.9 % IV SOLN
Freq: Once | INTRAVENOUS | Status: AC
Start: 2021-04-28 — End: 2021-04-28
  Filled 2021-04-28: qty 250

## 2021-04-28 MED ORDER — SODIUM CHLORIDE 0.9 % IV SOLN
480.0000 mg | Freq: Once | INTRAVENOUS | Status: AC
  Administered 2021-04-28: 480 mg via INTRAVENOUS
  Filled 2021-04-28: qty 48

## 2021-04-28 MED ORDER — HEPARIN SOD (PORK) LOCK FLUSH 100 UNIT/ML IV SOLN
500.0000 [IU] | Freq: Once | INTRAVENOUS | Status: DC | PRN
  Filled 2021-04-28: qty 5

## 2021-04-28 NOTE — Patient Instructions (Signed)
[facility]  Discharge Instructions: Thank you for choosing [facility] to provide your oncology and hematology care.  If you have a lab appointment with the Cancer Center, please go directly to the Cancer Center and check in at the registration area.   Wear comfortable clothing and clothing appropriate for easy access to any Portacath or PICC line.   We strive to give you quality time with your provider. You may need to reschedule your appointment if you arrive late (15 or more minutes).  Arriving late affects you and other patients whose appointments are after yours.  Also, if you miss three or more appointments without notifying the office, you may be dismissed from the clinic at the provider's discretion.      For prescription refill requests, have your pharmacy contact our office and allow 72 hours for refills to be completed.    Today you received the following chemotherapy and/or immunotherapy agents Nivolumab     To help prevent nausea and vomiting after your treatment, we encourage you to take your nausea medication as directed.  BELOW ARE SYMPTOMS THAT SHOULD BE REPORTED IMMEDIATELY: *FEVER GREATER THAN 100.4 F (38 C) OR HIGHER *CHILLS OR SWEATING *NAUSEA AND VOMITING THAT IS NOT CONTROLLED WITH YOUR NAUSEA MEDICATION *UNUSUAL SHORTNESS OF BREATH *UNUSUAL BRUISING OR BLEEDING *URINARY PROBLEMS (pain or burning when urinating, or frequent urination) *BOWEL PROBLEMS (unusual diarrhea, constipation, pain near the anus) TENDERNESS IN MOUTH AND THROAT WITH OR WITHOUT PRESENCE OF ULCERS (sore throat, sores in mouth, or a toothache) UNUSUAL RASH, SWELLING OR PAIN  UNUSUAL VAGINAL DISCHARGE OR ITCHING   Items with * indicate a potential emergency and should be followed up as soon as possible or go to the Emergency Department if any problems should occur.  Please show the CHEMOTHERAPY ALERT CARD or IMMUNOTHERAPY ALERT CARD at check-in to the  Emergency Department and triage nurse.  Should you have questions after your visit or need to cancel or reschedule your appointment, please contact [facility]  Dept: [phone]  and follow the prompts.  Office hours are 8:00 a.m. to 4:30 p.m. Monday - Friday. Please note that voicemails left after 4:00 p.m. may not be returned until the following business day.  We are closed weekends and major holidays. You have access to a nurse at all times for urgent questions. Please call the main number to the clinic Dept: [phone] and follow the prompts.  For any non-urgent questions, you may also contact your provider using MyChart. We now offer e-Visits for anyone 18 and older to request care online for non-urgent symptoms. For details visit [email or web address].   Also download the MyChart app! Go to the app store, search "MyChart", open the app, select [facility], and log in with your MyChart username and password.  Due to Covid, a mask is required upon entering the hospital/clinic. If you do not have a mask, one will be given to you upon arrival. For doctor visits, patients may have 1 support person aged 18 or older with them. For treatment visits, patients cannot have anyone with them due to current Covid guidelines and our immunocompromised population.    

## 2021-04-28 NOTE — Progress Notes (Signed)
[place]

## 2021-05-09 ENCOUNTER — Other Ambulatory Visit: Payer: Self-pay

## 2021-05-09 ENCOUNTER — Ambulatory Visit (INDEPENDENT_AMBULATORY_CARE_PROVIDER_SITE_OTHER): Payer: BC Managed Care – PPO | Admitting: Thoracic Surgery (Cardiothoracic Vascular Surgery)

## 2021-05-09 ENCOUNTER — Encounter: Payer: Self-pay | Admitting: Thoracic Surgery (Cardiothoracic Vascular Surgery)

## 2021-05-09 VITALS — BP 114/80 | HR 87 | Resp 20 | Wt 192.6 lb

## 2021-05-09 DIAGNOSIS — C159 Malignant neoplasm of esophagus, unspecified: Secondary | ICD-10-CM

## 2021-05-09 DIAGNOSIS — Z9049 Acquired absence of other specified parts of digestive tract: Secondary | ICD-10-CM | POA: Diagnosis not present

## 2021-05-09 DIAGNOSIS — Z9889 Other specified postprocedural states: Secondary | ICD-10-CM

## 2021-05-09 NOTE — Progress Notes (Signed)
      [address] [address]       [place] [address]             [phone]        Hodari Kirk Bagdasarian [place] Medical Record #[id number] Date of Birth: 1966/11/17  Referring: [name], MD Primary Care: Lillard Anes, MD Primary Cardiologist:None  Reason for visit:   follow-up  History of Present Illness:     55 year old male status post transhiatal esophagectomy by Dr. Servando Snare in November 2021.  He developed an incisional hernia which is being managed by Dr. Marla Roe.  He has no complaints today.  Physical Exam: BP 114/80 (BP Location: Right Arm, Patient Position: Sitting, Cuff Size: Normal)   Pulse 87   Resp 20   Wt 192 lb 9.6 oz (87.4 kg)   SpO2 97% Comment: RA  BMI 28.44 kg/m   Alert NAD Good granulation bed      Assessment / Plan:   55 year old male status post transhiatal esophagectomy with a ventral hernia currently being managed by plastic surgery.  Will defer management of the hernia to Dr. Marla Roe.  Will touch base with the patient in 4 weeks.  Lajuana Matte 05/09/2021 4:54 PM

## 2021-05-10 ENCOUNTER — Encounter: Payer: Self-pay | Admitting: Surgical

## 2021-05-10 ENCOUNTER — Ambulatory Visit (INDEPENDENT_AMBULATORY_CARE_PROVIDER_SITE_OTHER): Payer: BC Managed Care – PPO | Admitting: Surgical

## 2021-05-10 DIAGNOSIS — Z923 Personal history of irradiation: Secondary | ICD-10-CM | POA: Diagnosis not present

## 2021-05-10 DIAGNOSIS — S31109D Unspecified open wound of abdominal wall, unspecified quadrant without penetration into peritoneal cavity, subsequent encounter: Secondary | ICD-10-CM

## 2021-05-10 NOTE — Progress Notes (Signed)
   Referring Provider Thomas Anes, MD [address] [place],  [place] [address]   CC:  Chief Complaint  Patient presents with   Follow-up      Thomas Gonzalez is an 55 y.o. male.  HPI: Patient is a 55 year old male here for follow-up on his abdominal wound.  He has a history of radiation to this area for esophageal carcinoma.  He developed this wound after incisional hernia status post transhiatal esophagectomy in November 2021.  Patient presents today with his wife.  He reports that he has not noticed much improvement in the wound.  He has been using endoform dressing changes.  Review of Systems General: No fevers or chills  Physical Exam Vitals with BMI 05/09/2021 04/26/2021 04/01/2021  Height - 5\' 9"  5\' 9"   Weight 192 lbs 10 oz 192 lbs 10 oz 191 lbs  BMI 28.43 89.16 94.50  Systolic 388 828 003  Diastolic 80 90 83  Pulse 87 97 90    General:  No acute distress,  Alert and oriented, Non-Toxic, Normal speech and affect Abdomen: Abdominal wound with a base of granulation tissue is noted.  The granulation tissue is very loosely adherent to the base of the abdomen wound.  There is some surrounding irritation specifically at the superior portion of the wound.  There is no foul odors or exudative drainage noted.  There is no cellulitic changes.  There is hypergranulation at the edges and some areas.  The wound is approximately 10 x 6.5 cm     Assessment/Plan 55 year old male with a history of transesophagectomy and radiation for esophageal carcinoma.  He has an abdominal wound which she has had for quite some time.  There has been some difficulty healing due to his history of radiation.  We have been using endoform dressing changes which have had minimal improvement.  Today we debrided the granulation tissue at the edges for hypergranulation and used silver nitrate to stop any bleeding along with manual pressure.  The total area debrided was 40 cm.  I have  discussed with the patient that due to the radiation I suspect that he is going to have some delayed healing.  We did discuss that further surgical intervention with debridement and placement of wound matrix is possible.  Today in the office I applied donated collagen powder followed by Adaptic, K-Y jelly and 4 x 4 gauze and a Mepilex border dressing.  I recommend leaving this in place for 2 days and then beginning with K-Y jelly dressing changes.  He can begin with endoform dressing changes in approximately 5 days.  I would like him to follow-up in 3 weeks for reevaluation with Dr. Marla Gonzalez to discuss possible surgical intervention.  Picture was taken and placed in the patient's chart with patient permission  Thomas Gonzalez 05/10/2021, 3:26 PM

## 2021-05-13 ENCOUNTER — Telehealth: Payer: BC Managed Care – PPO | Admitting: Thoracic Surgery (Cardiothoracic Vascular Surgery)

## 2021-05-17 NOTE — Progress Notes (Signed)
[facility]  [address] [place],  [place]  [address] [phone]  Clinic Day:  05/24/2021  Referring physician: Lillard Anes,*  This document serves as a record of services personally performed by [name], MD. It was created on their behalf by [facility] E, a trained medical scribe. The creation of this record is based on the scribe's personal observations and the provider's statements to them.  HISTORY OF PRESENT ILLNESS:  The patient is a 55 y.o. 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  He comes in today to be evaluated before heading into his 8th cycle of maintenance nivolumab.  Since his last visit, the patient has been doing well.  He continues to tolerate his nivolumab immunotherapy very well without having any significant side effects.  He denies having any dysphagia, abdominal discomfort or other GI symptoms which concern him for early disease recurrence.     With respect to his esophageal cancer treatment history, he completed neoadjuvant chemoradiation, which consisted of 3 cycles of FOLFOX chemotherapy.  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  Pathology from this surgery showed persistent adenocarcinoma which extended into the muscularis propria level of his stomach.  Based upon this, he is undergoing adjuvant nivolumab immunotherapy for 1 year.   PHYSICAL EXAM:  Blood pressure 136/84, pulse 92, temperature 99 F (37.2 C), resp. rate 16, height 5\' 9"  (1.753 m), weight 190 lb (86.2 kg), SpO2 97 %. Wt Readings from Last 3 Encounters:  05/24/21 190 lb (86.2 kg)  05/09/21 192 lb 9.6 oz (87.4 kg)  04/26/21 192 lb 9.6 oz (87.4 kg)   Body mass index is 28.06 kg/m. Performance status (ECOG): 1 - Symptomatic but completely ambulatory Physical Exam Constitutional:      Appearance: Normal appearance. He is not ill-appearing.  HENT:     Mouth/Throat:      Mouth: Mucous membranes are moist.     Pharynx: Oropharynx is clear. No oropharyngeal exudate or posterior oropharyngeal erythema.  Cardiovascular:     Rate and Rhythm: Normal rate and regular rhythm.     Heart sounds: No murmur heard.   No friction rub. No gallop.  Pulmonary:     Effort: Pulmonary effort is normal. No respiratory distress.     Breath sounds: Normal breath sounds. No wheezing, rhonchi or rales.  Chest:  Breasts:    Right: No axillary adenopathy or supraclavicular adenopathy.     Left: No axillary adenopathy or supraclavicular adenopathy.  Abdominal:     General: Bowel sounds are normal. There is no distension.     Palpations: Abdomen is soft. There is no mass.     Tenderness: There is no abdominal tenderness.     Hernia: A hernia (midline abdominal protrusion) is present.  Musculoskeletal:        General: No swelling.     Right lower leg: No edema.     Left lower leg: No edema.  Lymphadenopathy:     Cervical: No cervical adenopathy.     Upper Body:     Right upper body: No supraclavicular or axillary adenopathy.     Left upper body: No supraclavicular or axillary adenopathy.     Lower Body: No right inguinal adenopathy. No left inguinal adenopathy.  Skin:    General: Skin is warm.     Coloration: Skin is not jaundiced.     Findings: No lesion or rash.  Neurological:  General: No focal deficit present.     Mental Status: He is alert and oriented to person, place, and time. Mental status is at baseline.     Cranial Nerves: Cranial nerves are intact.  Psychiatric:        Mood and Affect: Mood normal.        Behavior: Behavior normal.        Thought Content: Thought content normal.    LABS:    Ref. Range 05/24/2021 00:00  Sodium Latest Ref Range: 137 - 147  138  Potassium Latest Ref Range: 3.4 - 5.3  4.6  Chloride Latest Ref Range: 99 - 108  106  CO2 Latest Ref Range: 13 - 22  24 (A)  Glucose Unknown 109  BUN Latest Ref Range: 4 - 21  26 (A)   Creatinine Latest Ref Range: 0.6 - 1.3  0.7  Calcium Latest Ref Range: 8.7 - 10.7  8.7  Alkaline Phosphatase Latest Ref Range: 25 - 125  103  Albumin Latest Ref Range: 3.5 - 5.0  4.0  AST Latest Ref Range: 14 - 40  28  ALT Latest Ref Range: 10 - 40  25  Bilirubin, Total Unknown 0.4  WBC Unknown 8.7  RBC Latest Ref Range: 3.87 - 5.11  4.68  Hemoglobin Latest Ref Range: 13.5 - 17.5  15.4  HCT Latest Ref Range: 41 - 53  45  Platelets Latest Ref Range: 150 - 399  256  NEUT# Unknown 6.44   ASSESSMENT & PLAN:  Assessment/Plan:  A 55 y.o. 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  He will proceed with his 8th cycle of adjuvant nivolumab immunotherapy this week, which is being given at 480 mg once every 4 weeks.  Clinically, the patient appears to be doing very well.  I will see him back in 4 weeks before he heads into his 9th cycle of adjuvant nivolumab immunotherapy.  Scans will be done before his next visit to ensure he remains disease free.  The patient understands all the plans discussed today and is in agreement with them.     I, Rita Ohara, am acting as scribe for [name], MD    I have reviewed this report as typed by the medical scribe, and it is complete and accurate.  Dequincy Macarthur Critchley, MD

## 2021-05-24 ENCOUNTER — Other Ambulatory Visit: Payer: Self-pay | Admitting: Hematology and Oncology

## 2021-05-24 ENCOUNTER — Other Ambulatory Visit: Payer: Self-pay

## 2021-05-24 ENCOUNTER — Inpatient Hospital Stay: Payer: BC Managed Care – PPO | Attending: Oncology | Admitting: Oncology

## 2021-05-24 ENCOUNTER — Inpatient Hospital Stay: Payer: BC Managed Care – PPO

## 2021-05-24 ENCOUNTER — Telehealth: Payer: Self-pay | Admitting: Oncology

## 2021-05-24 ENCOUNTER — Other Ambulatory Visit: Payer: Self-pay | Admitting: Oncology

## 2021-05-24 VITALS — BP 136/84 | HR 92 | Temp 99.0°F | Resp 16 | Ht 69.0 in | Wt 190.0 lb

## 2021-05-24 DIAGNOSIS — C160 Malignant neoplasm of cardia: Secondary | ICD-10-CM | POA: Insufficient documentation

## 2021-05-24 DIAGNOSIS — Z79899 Other long term (current) drug therapy: Secondary | ICD-10-CM | POA: Insufficient documentation

## 2021-05-24 DIAGNOSIS — C159 Malignant neoplasm of esophagus, unspecified: Secondary | ICD-10-CM | POA: Diagnosis not present

## 2021-05-24 DIAGNOSIS — Z5112 Encounter for antineoplastic immunotherapy: Secondary | ICD-10-CM | POA: Insufficient documentation

## 2021-05-24 LAB — TSH: TSH: 0.644 u[IU]/mL (ref 0.350–4.500)

## 2021-05-24 LAB — BASIC METABOLIC PANEL
BUN: 26 — AB (ref 4–21)
CO2: 24 — AB (ref 13–22)
Chloride: 106 (ref 99–108)
Creatinine: 0.7 (ref 0.6–1.3)
Glucose: 109
Potassium: 4.6 (ref 3.4–5.3)
Sodium: 138 (ref 137–147)

## 2021-05-24 LAB — COMPREHENSIVE METABOLIC PANEL
Albumin: 4 (ref 3.5–5.0)
Calcium: 8.7 (ref 8.7–10.7)

## 2021-05-24 LAB — CBC AND DIFFERENTIAL
HCT: 45 (ref 41–53)
Hemoglobin: 15.4 (ref 13.5–17.5)
Neutrophils Absolute: 6.44
Platelets: 256 (ref 150–399)
WBC: 8.7

## 2021-05-24 LAB — HEPATIC FUNCTION PANEL
ALT: 25 (ref 10–40)
AST: 28 (ref 14–40)
Alkaline Phosphatase: 103 (ref 25–125)
Bilirubin, Total: 0.4

## 2021-05-24 LAB — CBC: RBC: 4.68 (ref 3.87–5.11)

## 2021-05-24 NOTE — Telephone Encounter (Signed)
Per 7/5 LOS, patient will be scheduled for Labs, CT Scan - Scheduled patient w/Kelli on 8/2.  Treatment scheduled for 8/4.  Gave patient Appt Summary

## 2021-05-26 ENCOUNTER — Inpatient Hospital Stay: Payer: BC Managed Care – PPO

## 2021-05-26 ENCOUNTER — Other Ambulatory Visit: Payer: Self-pay

## 2021-05-26 VITALS — BP 129/82 | HR 93 | Temp 98.3°F | Resp 18 | Ht 69.0 in | Wt 193.8 lb

## 2021-05-26 DIAGNOSIS — C159 Malignant neoplasm of esophagus, unspecified: Secondary | ICD-10-CM

## 2021-05-26 LAB — T4: T4, Total: 5.8 ug/dL (ref 4.5–12.0)

## 2021-05-26 MED ORDER — HEPARIN SOD (PORK) LOCK FLUSH 100 UNIT/ML IV SOLN
500.0000 [IU] | Freq: Once | INTRAVENOUS | Status: AC | PRN
  Administered 2021-05-26: 500 [IU]
  Filled 2021-05-26: qty 5

## 2021-05-26 MED ORDER — SODIUM CHLORIDE 0.9 % IV SOLN
480.0000 mg | Freq: Once | INTRAVENOUS | Status: AC
  Administered 2021-05-26: 480 mg via INTRAVENOUS
  Filled 2021-05-26: qty 48

## 2021-05-26 MED ORDER — SODIUM CHLORIDE 0.9 % IV SOLN
Freq: Once | INTRAVENOUS | Status: AC
  Filled 2021-05-26: qty 250

## 2021-05-26 NOTE — Patient Instructions (Signed)
[facility]  Discharge Instructions: Thank you for choosing [facility] to provide your oncology and hematology care.  If you have a lab appointment with the Cancer Center, please go directly to the Cancer Center and check in at the registration area.   Wear comfortable clothing and clothing appropriate for easy access to any Portacath or PICC line.   We strive to give you quality time with your provider. You may need to reschedule your appointment if you arrive late (15 or more minutes).  Arriving late affects you and other patients whose appointments are after yours.  Also, if you miss three or more appointments without notifying the office, you may be dismissed from the clinic at the provider's discretion.      For prescription refill requests, have your pharmacy contact our office and allow 72 hours for refills to be completed.    Today you received the following chemotherapy and/or immunotherapy agents Nivolumab     To help prevent nausea and vomiting after your treatment, we encourage you to take your nausea medication as directed.  BELOW ARE SYMPTOMS THAT SHOULD BE REPORTED IMMEDIATELY: *FEVER GREATER THAN 100.4 F (38 C) OR HIGHER *CHILLS OR SWEATING *NAUSEA AND VOMITING THAT IS NOT CONTROLLED WITH YOUR NAUSEA MEDICATION *UNUSUAL SHORTNESS OF BREATH *UNUSUAL BRUISING OR BLEEDING *URINARY PROBLEMS (pain or burning when urinating, or frequent urination) *BOWEL PROBLEMS (unusual diarrhea, constipation, pain near the anus) TENDERNESS IN MOUTH AND THROAT WITH OR WITHOUT PRESENCE OF ULCERS (sore throat, sores in mouth, or a toothache) UNUSUAL RASH, SWELLING OR PAIN  UNUSUAL VAGINAL DISCHARGE OR ITCHING   Items with * indicate a potential emergency and should be followed up as soon as possible or go to the Emergency Department if any problems should occur.  Please show the CHEMOTHERAPY ALERT CARD or IMMUNOTHERAPY ALERT CARD at check-in to the  Emergency Department and triage nurse.  Should you have questions after your visit or need to cancel or reschedule your appointment, please contact [facility]  Dept: [phone]  and follow the prompts.  Office hours are 8:00 a.m. to 4:30 p.m. Monday - Friday. Please note that voicemails left after 4:00 p.m. may not be returned until the following business day.  We are closed weekends and major holidays. You have access to a nurse at all times for urgent questions. Please call the main number to the clinic Dept: [phone] and follow the prompts.  For any non-urgent questions, you may also contact your provider using MyChart. We now offer e-Visits for anyone 18 and older to request care online for non-urgent symptoms. For details visit [email or web address].   Also download the MyChart app! Go to the app store, search "MyChart", open the app, select [facility], and log in with your MyChart username and password.  Due to Covid, a mask is required upon entering the hospital/clinic. If you do not have a mask, one will be given to you upon arrival. For doctor visits, patients may have 1 support person aged 18 or older with them. For treatment visits, patients cannot have anyone with them due to current Covid guidelines and our immunocompromised population.    

## 2021-05-26 NOTE — Progress Notes (Signed)
1512: PT STABLE AT TIME OF DISCHARGE  

## 2021-05-31 ENCOUNTER — Other Ambulatory Visit: Payer: Self-pay

## 2021-05-31 ENCOUNTER — Encounter: Payer: Self-pay | Admitting: Plastic Surgery

## 2021-05-31 ENCOUNTER — Ambulatory Visit (INDEPENDENT_AMBULATORY_CARE_PROVIDER_SITE_OTHER): Payer: BC Managed Care – PPO | Admitting: Plastic Surgery

## 2021-05-31 DIAGNOSIS — S31109D Unspecified open wound of abdominal wall, unspecified quadrant without penetration into peritoneal cavity, subsequent encounter: Secondary | ICD-10-CM | POA: Diagnosis not present

## 2021-05-31 NOTE — Progress Notes (Signed)
   Subjective:    Patient ID: Thomas Gonzalez, male    DOB: July 20, 1966, 55 y.o.   MRN: [id number]  The patient is a 55 year old male here for follow-up on his abdominal wound.  He has been using the endoform at home.  It is 5-6 x 12 cm in size.  There has not been any great change from his last visit.  Does not appear to be any sign of infection.     Review of Systems  Constitutional: Negative.   Eyes: Negative.   Respiratory: Negative.    Cardiovascular: Negative.   Gastrointestinal: Negative.   Endocrine: Negative.   Genitourinary: Negative.   Musculoskeletal: Negative.   Skin:  Positive for wound.  Hematological: Negative.   Psychiatric/Behavioral: Negative.        Objective:   Physical Exam Vitals and nursing note reviewed.  Constitutional:      Appearance: Normal appearance.  HENT:     Head: Normocephalic and atraumatic.  Cardiovascular:     Rate and Rhythm: Normal rate.     Pulses: Normal pulses.  Pulmonary:     Effort: Pulmonary effort is normal.  Abdominal:     General: There is distension.     Hernia: A hernia is present.    Skin:    Coloration: Skin is not jaundiced.     Findings: No bruising.  Neurological:     Mental Status: He is alert. Mental status is at baseline.  Psychiatric:        Mood and Affect: Mood normal.        Behavior: Behavior normal.        Thought Content: Thought content normal.          Assessment & Plan:     ICD-10-CM   1. Open wound of abdominal wall, subsequent encounter  S31.109D     Surgery is an option for placement of further product.  The patient is going through some insurance changes so wants to wait and get that settled before doing any surgery.  At this point I do not have a lot of other options.  Due to the inability to get general surgery to repair the hernia I am not sure how much more I can do.  I think that input from the wound care center might be very helpful and the patient is in agreement for this.  We  will see about setting up consult.

## 2021-06-01 ENCOUNTER — Telehealth: Payer: Self-pay

## 2021-06-01 DIAGNOSIS — S31109A Unspecified open wound of abdominal wall, unspecified quadrant without penetration into peritoneal cavity, initial encounter: Secondary | ICD-10-CM | POA: Diagnosis not present

## 2021-06-01 NOTE — Telephone Encounter (Signed)
Faxed order to [facility] on 05/31/21 x the following medical supplies:  Super Absorber (Fiber Gel Dressing)-7x10-every other day  Endoform-7x10-every other day  Received confirmation same day (5:30 pm) and Order Status Notification on 06/01/21 (10:59 am).

## 2021-06-15 ENCOUNTER — Encounter: Payer: Self-pay | Admitting: Oncology

## 2021-06-16 ENCOUNTER — Encounter: Payer: Self-pay | Admitting: Oncology

## 2021-06-17 ENCOUNTER — Encounter: Payer: Self-pay | Admitting: Oncology

## 2021-06-17 ENCOUNTER — Telehealth (INDEPENDENT_AMBULATORY_CARE_PROVIDER_SITE_OTHER): Payer: Self-pay | Admitting: Thoracic Surgery (Cardiothoracic Vascular Surgery)

## 2021-06-17 ENCOUNTER — Other Ambulatory Visit: Payer: Self-pay

## 2021-06-17 DIAGNOSIS — C159 Malignant neoplasm of esophagus, unspecified: Secondary | ICD-10-CM

## 2021-06-17 NOTE — Progress Notes (Signed)
[address]       [place] [address]             [phone]       Patient: Home Provider: Office Consent for Telemedicine visit obtained.  Today's visit was completed via a real-time telehealth (see specific modality noted below). The patient/authorized person provided oral consent at the time of the visit to engage in a telemedicine encounter with the present provider at [facility]. The patient/authorized person was informed of the potential benefits, limitations, and risks of telemedicine. The patient/authorized person expressed understanding that the laws that protect confidentiality also apply to telemedicine. The patient/authorized person acknowledged understanding that telemedicine does not provide emergency services and that he or she would need to call 911 or proceed to the nearest hospital for help if such a need arose.   Total time spent in the clinical discussion 10 minutes.  Telehealth Modality: Phone visit (audio only)  I had a telephone visit with Mr. Thomas Gonzalez.  He is status post transhiatal esophagectomy by Dr. Servando Snare on 09/20/2020.  His postoperative course was complicated by ventral hernia and an open wound.  He has met with Dr. Marla Roe, and she is establishing care with one of the general surgeons for ventral hernia repair.  He continues wound care and there is not much change since his last visit.  He also continues to see his oncologist and is scheduled for a CT scan shortly.  We will follow-up with the scan for surveillance purposes.  He will call us if there is any new needs or concerns.

## 2021-06-21 ENCOUNTER — Inpatient Hospital Stay: Payer: 59

## 2021-06-21 ENCOUNTER — Encounter: Payer: Self-pay | Admitting: Oncology

## 2021-06-21 ENCOUNTER — Ambulatory Visit: Payer: BC Managed Care – PPO | Admitting: Oncology

## 2021-06-21 ENCOUNTER — Other Ambulatory Visit: Payer: BC Managed Care – PPO

## 2021-06-21 ENCOUNTER — Inpatient Hospital Stay: Payer: 59 | Attending: Oncology | Admitting: Hematology and Oncology

## 2021-06-21 ENCOUNTER — Other Ambulatory Visit: Payer: Self-pay

## 2021-06-21 ENCOUNTER — Encounter: Payer: Self-pay | Admitting: Hematology and Oncology

## 2021-06-21 DIAGNOSIS — C159 Malignant neoplasm of esophagus, unspecified: Secondary | ICD-10-CM | POA: Diagnosis not present

## 2021-06-21 DIAGNOSIS — C160 Malignant neoplasm of cardia: Secondary | ICD-10-CM | POA: Diagnosis not present

## 2021-06-21 DIAGNOSIS — Z5112 Encounter for antineoplastic immunotherapy: Secondary | ICD-10-CM | POA: Insufficient documentation

## 2021-06-21 DIAGNOSIS — Z87891 Personal history of nicotine dependence: Secondary | ICD-10-CM | POA: Diagnosis not present

## 2021-06-21 DIAGNOSIS — Z79899 Other long term (current) drug therapy: Secondary | ICD-10-CM | POA: Insufficient documentation

## 2021-06-21 LAB — COMPREHENSIVE METABOLIC PANEL
Albumin: 4.1 (ref 3.5–5.0)
Calcium: 9 (ref 8.7–10.7)

## 2021-06-21 LAB — CBC AND DIFFERENTIAL
HCT: 46 (ref 41–53)
Hemoglobin: 15.7 (ref 13.5–17.5)
Neutrophils Absolute: 5.69
Platelets: 265 (ref 150–399)
WBC: 7.8

## 2021-06-21 LAB — BASIC METABOLIC PANEL
BUN: 22 — AB (ref 4–21)
CO2: 26 — AB (ref 13–22)
Chloride: 106 (ref 99–108)
Creatinine: 0.8 (ref 0.6–1.3)
Glucose: 114
Potassium: 4.2 (ref 3.4–5.3)
Sodium: 140 (ref 137–147)

## 2021-06-21 LAB — HEPATIC FUNCTION PANEL
ALT: 22 (ref 10–40)
AST: 25 (ref 14–40)
Alkaline Phosphatase: 106 (ref 25–125)
Bilirubin, Total: 0.4

## 2021-06-21 LAB — TSH: TSH: 0.502 u[IU]/mL (ref 0.350–4.500)

## 2021-06-21 LAB — CBC
MCV: 94 (ref 80–94)
RBC: 4.95 (ref 3.87–5.11)

## 2021-06-21 NOTE — Progress Notes (Signed)
[facility]  [address] [place],  [place]  [address] [phone]  Clinic Day:  06/21/2021  Referring physician: Lillard Anes,*   CHIEF COMPLAINT:  CC:   Stage IVA esophageal cancer  Current Treatment:   Maintenance nivolumab   HISTORY OF PRESENT ILLNESS:  Benen Weida is a 55 y.o. male with a history of stage IVA (T3 N2 M0) adenocarcinoma of his gastroesophageal junction.  He received neoadjuvant chemoradiation which consisted of 3 cycles of FOLFOX chemotherapy.  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  Pathology from this surgery showed persistent adenocarcinoma which extended into the muscularis propria level of his stomach.  Based upon this, he is undergoing adjuvant nivolumab immunotherapy for 1 year.  INTERVAL HISTORY:  Jarquez is here today for repeat clinical assessment prior to a 9th cycle of nivolumab. He continues to tolerate his nivolumab immunotherapy very well without having any significant side effects.  He denies having any dysphagia, abdominal discomfort or other GI symptoms which concern him for early disease recurrence.  He denies fevers or chills. He denies pain. His appetite is good. His weight has been stable. CT Scans were to be done to reassess his disease baseline prior to his visit today, but were not scheduled.  He has had a chronic wound of his abdomen since surgery and continues follow-up with Dr.  Marla Roe in [place] for this.  REVIEW OF SYSTEMS:  Review of Systems  Constitutional:  Negative for appetite change, chills, fatigue, fever and unexpected weight change.  HENT:   Negative for lump/mass, mouth sores and sore throat.   Respiratory:  Negative for cough and shortness of breath.   Cardiovascular:  Negative for chest pain and leg swelling.  Gastrointestinal:  Negative for abdominal pain, constipation, diarrhea, nausea and vomiting.  Genitourinary:  Negative for  difficulty urinating, dysuria, frequency and hematuria.   Musculoskeletal:  Negative for arthralgias, back pain and myalgias.  Skin:  Negative for itching, rash and wound.  Neurological:  Negative for dizziness, extremity weakness, headaches, light-headedness and numbness.  Hematological:  Negative for adenopathy.  Psychiatric/Behavioral:  Negative for depression and sleep disturbance. The patient is not nervous/anxious.     VITALS:  Blood pressure 134/85, pulse 93, temperature 98.8 F (37.1 C), resp. rate 16, height 5' 9"  (1.753 m), weight 191 lb 3.2 oz (86.7 kg), SpO2 98 %.  Wt Readings from Last 3 Encounters:  06/21/21 191 lb 3.2 oz (86.7 kg)  05/26/21 193 lb 12 oz (87.9 kg)  05/24/21 190 lb (86.2 kg)    Body mass index is 28.24 kg/m.  Performance status (ECOG): 0 - Asymptomatic  PHYSICAL EXAM:  Physical Exam Vitals and nursing note reviewed.  Constitutional:      General: He is not in acute distress.    Appearance: Normal appearance. He is normal weight.  HENT:     Head: Normocephalic and atraumatic.     Mouth/Throat:     Mouth: Mucous membranes are moist.     Pharynx: Oropharynx is clear. No oropharyngeal exudate or posterior oropharyngeal erythema.  Eyes:     General: No scleral icterus.    Extraocular Movements: Extraocular movements intact.     Conjunctiva/sclera: Conjunctivae normal.     Pupils: Pupils are equal, round, and reactive to light.  Cardiovascular:     Rate and Rhythm: Normal rate and regular rhythm.     Heart sounds: Normal heart sounds. No murmur heard.   No friction rub.  No gallop.  Pulmonary:     Effort: Pulmonary effort is normal.     Breath sounds: Normal breath sounds. No wheezing, rhonchi or rales.  Chest:  Breasts:    Right: No axillary adenopathy or supraclavicular adenopathy.     Left: No axillary adenopathy or supraclavicular adenopathy.  Abdominal:     General: Bowel sounds are normal. There is no distension.     Palpations: Abdomen is  soft. There is no hepatomegaly, splenomegaly or mass.     Tenderness: There is no abdominal tenderness.     Comments:  Mid abdominal wound is dressed  Musculoskeletal:        General: Normal range of motion.     Cervical back: Normal range of motion and neck supple. No tenderness.     Right lower leg: No edema.     Left lower leg: No edema.  Lymphadenopathy:     Cervical: No cervical adenopathy.     Upper Body:     Right upper body: No supraclavicular or axillary adenopathy.     Left upper body: No supraclavicular or axillary adenopathy.     Lower Body: No right inguinal adenopathy. No left inguinal adenopathy.  Skin:    General: Skin is warm and dry.     Coloration: Skin is not jaundiced.     Findings: No rash.  Neurological:     Mental Status: He is alert and oriented to person, place, and time.     Cranial Nerves: No cranial nerve deficit.  Psychiatric:        Mood and Affect: Mood normal.        Behavior: Behavior normal.        Thought Content: Thought content normal.    LABS:   CBC Latest Ref Rng & Units 06/21/2021 05/24/2021 04/26/2021  WBC - 7.8 8.7 8.1  Hemoglobin 13.5 - 17.5 15.7 15.4 15.4  Hematocrit 41 - 53 46 45 46  Platelets 150 - 399 265 256 255   CMP Latest Ref Rng & Units 06/21/2021 05/24/2021 04/26/2021  Glucose 70 - 99 mg/dL - - -  BUN 4 - 21 22(A) 26(A) 24(A)  Creatinine 0.6 - 1.3 0.8 0.7 0.6  Sodium 137 - 147 140 138 137  Potassium 3.4 - 5.3 4.2 4.6 4.2  Chloride 99 - 108 106 106 106  CO2 13 - 22 26(A) 24(A) 24(A)  Calcium 8.7 - 10.7 9.0 8.7 8.6(A)  Total Protein 6.5 - 8.1 g/dL - - -  Total Bilirubin 0.3 - 1.2 mg/dL - - -  Alkaline Phos 25 - 125 106 103 126(A)  AST 14 - 40 25 28 35  ALT 10 - 40 22 25 39     No results found for: CEA1 / No results found for: CEA1 No results found for: PSA1 No results found for: CZY606 No results found for: TKZ601  No results found for: TOTALPROTELP, ALBUMINELP, A1GS, A2GS, BETS, BETA2SER, GAMS, MSPIKE, SPEI No results  found for: TIBC, FERRITIN, IRONPCTSAT No results found for: LDH  STUDIES:  No results found.    HISTORY:   Past Medical History:  Diagnosis Date   Bowel obstruction ([facility])    06/21/2017 reported- "years ago"   Cancer ([facility])    basal cell face   GERD (gastroesophageal reflux disease)    History of kidney stones    x 2    Past Surgical History:  Procedure Laterality Date   APPENDECTOMY  0932   APPLICATION OF A-CELL OF EXTREMITY N/A  01/27/2021   Procedure: APPLICATION OF MYRIAD OR MatriDerm;  Surgeon: Wallace Going, DO;  Location: [facility];  Service: Plastics;  Laterality: N/A;   COMPLETE ESOPHAGECTOMY N/A 09/20/2020   Procedure: Transhiatal total ESOPHAGECTOMY with Pyloroplasty;  Surgeon: Grace Isaac, MD;  Location: [facility];  Service: Thoracic;  Laterality: N/A;   HERNIA REPAIR Bilateral 2007,2010,2020   Inguinial    INCISION AND DRAINAGE OF WOUND N/A 01/27/2021   Procedure: Debridement of abdominal wound;  Surgeon: Wallace Going, DO;  Location: [facility];  Service: Plastics;  Laterality: N/A;  45 min   JEJUNOSTOMY N/A 09/20/2020   Procedure: JEJUNOSTOMY;  Surgeon: Grace Isaac, MD;  Location: [facility];  Service: Thoracic;  Laterality: N/A;   KNEE ARTHROSCOPY Left 06/27/2017   Procedure: ARTHROSCOPY KNEE;  Surgeon: Dorna Leitz, MD;  Location: [facility];  Service: Orthopedics;  Laterality: Left;   REMOVAL OF GASTROSTOMY TUBE  09/20/2020   Procedure: REMOVAL OF GASTROSTOMY TUBE;  Surgeon: Grace Isaac, MD;  Location: [facility];  Service: Thoracic;;   VIDEO BRONCHOSCOPY N/A 09/20/2020   Procedure: VIDEO BRONCHOSCOPY;  Surgeon: Grace Isaac, MD;  [facility];  Service: Thoracic;  Laterality: N/A;    Family History  Family history unknown: Yes    Social History:  reports that he has quit smoking. His smoking use included cigarettes. He has quit using smokeless tobacco. He reports that he does not drink alcohol and does not use  drugs.The patient is alone today.  Allergies:  Allergies  Allergen Reactions   Penicillins Anaphylaxis   Prednisone Anxiety    Mood swings    Current Medications: Current Outpatient Medications  Medication Sig Dispense Refill   acetaminophen (TYLENOL) 325 MG tablet Take 2 tablets (650 mg total) by mouth every 6 (six) hours as needed.     ascorbic acid (VITAMIN C) 500 MG tablet Take 500 mg by mouth daily.     cholecalciferol (VITAMIN D3) 25 MCG (1000 UNIT) tablet Take 1,000 Units by mouth daily.     omeprazole (PRILOSEC) 40 MG capsule Take 40 mg by mouth every morning.     ondansetron (ZOFRAN) 4 MG tablet Take 1 tablet (4 mg total) by mouth every 4 (four) hours as needed for nausea. 90 tablet 3   prochlorperazine (COMPAZINE) 10 MG tablet Take 1 tablet (10 mg total) by mouth every 6 (six) hours as needed for nausea or vomiting. 90 tablet 3   zinc gluconate 50 MG tablet Take 50 mg by mouth daily.     No current facility-administered medications for this visit.     ASSESSMENT & PLAN:   Assessment/Plan:  Seymore Brodowski is a 55 y.o. male with stage IVA esophageal cancer for which she is currently on maintenance nivolumab.  We had planned repeat imaging prior to this cycle of nivolumab, but it did not get scheduled.  As he remains asymptomatic, I will proceed with nivolumab this week and plan to repeat CT imaging prior to his 10th cycle of maintenance nivolumab. The patient understands the plans discussed today and is in agreement with them.  He knows to contact our office if he develops concerns prior to his next appointment.     Marvia Pickles, PA-C

## 2021-06-22 ENCOUNTER — Other Ambulatory Visit: Payer: Self-pay | Admitting: Pharmacist

## 2021-06-22 ENCOUNTER — Encounter (HOSPITAL_BASED_OUTPATIENT_CLINIC_OR_DEPARTMENT_OTHER): Payer: 59 | Attending: Physician Assistant | Admitting: Physician Assistant

## 2021-06-22 ENCOUNTER — Telehealth: Payer: Self-pay | Admitting: Oncology

## 2021-06-22 DIAGNOSIS — T8131XA Disruption of external operation (surgical) wound, not elsewhere classified, initial encounter: Secondary | ICD-10-CM | POA: Diagnosis present

## 2021-06-22 DIAGNOSIS — L98492 Non-pressure chronic ulcer of skin of other sites with fat layer exposed: Secondary | ICD-10-CM | POA: Insufficient documentation

## 2021-06-22 DIAGNOSIS — Z880 Allergy status to penicillin: Secondary | ICD-10-CM | POA: Diagnosis not present

## 2021-06-22 DIAGNOSIS — K439 Ventral hernia without obstruction or gangrene: Secondary | ICD-10-CM | POA: Diagnosis not present

## 2021-06-22 DIAGNOSIS — Z8501 Personal history of malignant neoplasm of esophagus: Secondary | ICD-10-CM | POA: Insufficient documentation

## 2021-06-22 DIAGNOSIS — X58XXXA Exposure to other specified factors, initial encounter: Secondary | ICD-10-CM | POA: Insufficient documentation

## 2021-06-22 DIAGNOSIS — Z888 Allergy status to other drugs, medicaments and biological substances status: Secondary | ICD-10-CM | POA: Insufficient documentation

## 2021-06-22 NOTE — Telephone Encounter (Signed)
Per 8/3 Staff Msg, patient's 8/4 Infusion to 8/10 due to [employer] Clearance

## 2021-06-22 NOTE — Progress Notes (Signed)
NECO, VIVERITO ([id number]) Visit Report for 06/22/2021 Abuse/Suicide Risk Screen Details Patient Name: Date of Service: [name] 06/22/2021 2:45 PM Medical Record Number: [id number] Patient Account Number: [id number] Date of Birth/Sex: Treating RN: 10/29/1966 (55 y.o. [name] Primary Care Fardowsa Authier: [name] Other Clinician: Referring Levell Tavano: Treating Cesia Orf/Extender: [name] in Treatment: 0 Abuse/Suicide Risk Screen Items Answer ABUSE RISK SCREEN: Has anyone close to you tried to hurt or harm you recentlyo No Do you feel uncomfortable with anyone in your familyo No Has anyone forced you do things that you didnt want to doo No Electronic Signature(s) Signed: 06/22/2021 5:27:26 PM By: Lorrin Jackson Entered By: Lorrin Jackson on 06/22/2021 15:05:34 -------------------------------------------------------------------------------- Activities of Daily Living Details Patient Name: Date of Service: Thomas Gonzalez 06/22/2021 2:45 PM Medical Record Number: [id number] Patient Account Number: [id number] Date of Birth/Sex: Treating RN: February 26, 1966 (54 y.o. [name] Primary Care Tonji Elliff: [name] Other Clinician: Referring Drena Ham: Treating Shahida Schnackenberg/Extender: [name] in Treatment: 0 Activities of Daily Living Items Answer Activities of Daily Living (Please select one for each item) Drive Automobile Completely Able T Medications ake Completely Able Use T elephone Completely Able Care for Appearance Completely Able Use T oilet Completely Able Bath / Shower Completely Able Dress Self Completely Able Feed Self Completely Able Walk Completely Able Get In / Out Bed Completely Able Housework Completely Able Prepare Meals Completely [place] Completely Able Shop for Self Need Assistance Electronic Signature(s) Signed: 06/22/2021 5:27:26 PM By: Lorrin Jackson Entered By:  Lorrin Jackson on 06/22/2021 15:06:06 -------------------------------------------------------------------------------- Education Screening Details Patient Name: Date of Service: [name]. 06/22/2021 2:45 PM Medical Record Number: [id number] Patient Account Number: [id number] Date of Birth/Sex: Treating RN: 11-02-1966 (55 y.o. [name] Primary Care Ioana Louks: [name] Other Clinician: Referring Gabriellia Rempel: Treating Gryphon Vanderveen/Extender: [name] in Treatment: 0 Primary Learner Assessed: Patient Learning Preferences/Education Level/Primary Language Learning Preference: Explanation, Demonstration, Printed Material Highest Education Level: High School Preferred Language: English Cognitive Barrier Language Barrier: No Translator Needed: No Memory Deficit: No Emotional Barrier: No Cultural/Religious Beliefs Affecting Medical Care: No Physical Barrier Impaired Vision: Yes Glasses Impaired Hearing: No Decreased Hand dexterity: No Knowledge/Comprehension Knowledge Level: High Comprehension Level: High Ability to understand written instructions: High Ability to understand verbal instructions: High Motivation Anxiety Level: Calm Cooperation: Cooperative Education Importance: Acknowledges Need Interest in Health Problems: Asks Questions Perception: Coherent Willingness to Engage in Self-Management High Activities: Readiness to Engage in Self-Management High Activities: Electronic Signature(s) Signed: 06/22/2021 5:27:26 PM By: Lorrin Jackson Entered By: Lorrin Jackson on 06/22/2021 15:06:37 -------------------------------------------------------------------------------- Fall Risk Assessment Details Patient Name: Date of Service: [name]. 06/22/2021 2:45 PM Medical Record Number: [id number] Patient Account Number: [id number] Date of Birth/Sex: Treating RN: 10/21/1966 (55 y.o. [name] Primary Care Safiyah Cisney:  [name] Other Clinician: Referring Marlane Hirschmann: Treating Keith Cancio/Extender: [name] in Treatment: 0 Fall Risk Assessment Items Have you had 2 or more falls in the last 12 monthso 0 No Have you had any fall that resulted in injury in the last 12 monthso 0 No FALLS RISK SCREEN History of falling - immediate or within 3 months 0 No Secondary diagnosis (Do you have 2 or more medical diagnoseso) 0 No Ambulatory aid None/bed rest/wheelchair/nurse 0 Yes Crutches/cane/walker 0 No Furniture 0 No Intravenous therapy Access/Saline/Heparin Lock 0 No Gait/Transferring Normal/ bed rest/ wheelchair 0 Yes Weak (  short steps with or without shuffle, stooped but able to lift head while walking, may seek 0 No support from furniture) Impaired (short steps with shuffle, may have difficulty arising from chair, head down, impaired 0 No balance) Mental Status Oriented to own ability 0 Yes Electronic Signature(s) Signed: 06/22/2021 5:27:26 PM By: Lorrin Jackson Entered By: Lorrin Jackson on 06/22/2021 15:06:51 -------------------------------------------------------------------------------- Foot Assessment Details Patient Name: Date of Service: [name]. 06/22/2021 2:45 PM Medical Record Number: [id number] Patient Account Number: [id number] Date of Birth/Sex: Treating RN: [birth year] (55 y.o. [name] Primary Care Sabree Nuon: [name] Other Clinician: Referring Huong Luthi: Treating Carilyn Woolston/Extender: [name] in Treatment: 0 Foot Assessment Items Site Locations + = Sensation present, - = Sensation absent, C = Callus, U = Ulcer R = Redness, W = Warmth, M = Maceration, PU = Pre-ulcerative lesion F = Fissure, S = Swelling, D = Dryness Assessment Right: Left: Other Deformity: No No Prior Foot Ulcer: No No Prior Amputation: No No Charcot Joint: No No Ambulatory Status: Ambulatory Without Help Gait:  Steady Notes N/A: Abdominal wound Electronic Signature(s) Signed: 06/22/2021 5:27:26 PM By: Lorrin Jackson Entered By: Lorrin Jackson on 06/22/2021 15:07:21 -------------------------------------------------------------------------------- Nutrition Risk Screening Details Patient Name: Date of Service: [name] 06/22/2021 2:45 PM Medical Record Number: [id number] Patient Account Number: [id number] Date of Birth/Sex: Treating RN: [birth year] (55 y.o. [name] Primary Care Elycia Woodside: [name] Other Clinician: Referring Ocean Schildt: Treating Martavia Tye/Extender: [name] in Treatment: 0 Height (in): 69 Weight (lbs): 191 Body Mass Index (BMI): 28.2 Nutrition Risk Screening Items Score Screening NUTRITION RISK SCREEN: I have an illness or condition that made me change the kind and/or amount of food I eat 0 No I eat fewer than two meals per day 0 No I eat few fruits and vegetables, or milk products 0 No I have three or more drinks of beer, liquor or wine almost every day 0 No I have tooth or mouth problems that make it hard for me to eat 0 No I don't always have enough money to buy the food I need 0 No I eat alone most of the time 0 No I take three or more different prescribed or over-the-counter drugs a day 1 Yes Without wanting to, I have lost or gained 10 pounds in the last six months 0 No I am not always physically able to shop, cook and/or feed myself 0 No Nutrition Protocols Good Risk Protocol 0 No interventions needed Moderate Risk Protocol High Risk Proctocol Risk Level: Good Risk Score: 1 Electronic Signature(s) Signed: 06/22/2021 5:27:26 PM By: Lorrin Jackson Entered By: Lorrin Jackson on 06/22/2021 15:07:02

## 2021-06-22 NOTE — Progress Notes (Signed)
Thomas Gonzalez ([id number]) Visit Report for 06/22/2021 Chief Complaint Document Details Patient Name: Date of Service: [name] 06/22/2021 2:45 PM Medical Record Number: [id number] Patient Account Number: [id number] Date of Birth/Sex: Treating RN: 02-Aug-1966 (55 y.o. Thomas Gonzalez Primary Care Provider: [name] Other Clinician: Referring Provider: Treating Provider/Extender: [name] in Treatment: 0 Information Obtained from: Patient Chief Complaint Surgical Ulcer Electronic Signature(s) Signed: 06/22/2021 3:37:09 PM By: Worthy Keeler PA-C Entered By: Worthy Keeler on 06/22/2021 15:37:09 -------------------------------------------------------------------------------- Debridement Details Patient Name: Date of Service: [name] 06/22/2021 2:45 PM Medical Record Number: [id number] Patient Account Number: [id number] Date of Birth/Sex: Treating RN: 1966/01/06 (55 y.o. Thomas Gonzalez Primary Care Provider: [name] Other Clinician: Referring Provider: Treating Provider/Extender: [name] in Treatment: 0 Debridement Performed for Assessment: Wound #1 Abdomen - midline Performed By: Physician Worthy Keeler, PA Debridement Type: Debridement Level of Consciousness (Pre-procedure): Awake and Alert Pre-procedure Verification/Time Out Yes - 15:50 Taken: Start Time: 15:51 T Area Debrided (L x W): otal 8.5 (cm) x 7.8 (cm) = 66.3 (cm) Tissue and other material debrided: Viable, Non-Viable, Subcutaneous, Hyper-granulation Level: Skin/Subcutaneous Tissue Debridement Description: Excisional Instrument: Curette, Forceps, Scissors Specimen: Tissue Culture Number of Specimens T aken: 1 Bleeding: Minimum Hemostasis Achieved: Silver Nitrate End Time: 15:59 Procedural Pain: 0 Post Procedural Pain: 0 Response to Treatment: Procedure was tolerated well Level of Consciousness (Post- Awake  and Alert procedure): Post Debridement Measurements of Total Wound Length: (cm) 8.5 Width: (cm) 7.8 Depth: (cm) 0.1 Volume: (cm) 5.207 Character of Wound/Ulcer Post Debridement: Improved Post Procedure Diagnosis Same as Pre-procedure Electronic Signature(s) Signed: 06/22/2021 5:22:07 PM By: Worthy Keeler PA-C Signed: 06/22/2021 5:57:23 PM By: Baruch Gouty RN, BSN Entered By: Baruch Gouty on 06/22/2021 15:59:48 -------------------------------------------------------------------------------- HPI Details Patient Name: Date of Service: [name]. 06/22/2021 2:45 PM Medical Record Number: [id number] Patient Account Number: [id number] Date of Birth/Sex: Treating RN: 07/28/1966 (55 y.o. Thomas Gonzalez Primary Care Provider: [name] Other Clinician: Referring Provider: Treating Provider/Extender: [name] in Treatment: 0 History of Present Illness HPI Description: 06/22/2021 patient presents today as a pleasant gentleman who unfortunately has been having quite a bit of an issue with his abdominal area following surgery in 2021 November. 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 Subsequently he also was on immunotherapy following to try to help with any residual carcinoma following the procedure. Nonetheless he had an issue with the incision site in the mid abdomen and unfortunately this ended up dehiscing and has had trouble since getting this healed. He has been seen by Dr. Tedra Gonzalez him where ACell was utilized but again there is really no response she referred the patient to [place] for further evaluation and treatment. No chemical cauterization with silver nitrate has been utilized. He also has not had any he tells me aggressive sharp debridement of the area to clear away some of this hypergranular tissue which he has significant amounts noted. Either way I think that we will get a need to try to see about  removing this I do think, to send sample of this to biopsy just to make sure there is no signs of cancer noted although I really think that is probably not can be the case I think we will get a get a report back stating hypergranulation tissue. With that being said the patient does seem to  have quite a bit of bleeding and friability even with wiping over the wound area and again I think this is a big part of the issue again he can even grow new skin because anything attaching is attaching to the hypergranulation which is not even get a be anywhere close to sufficient for good epithelial growth. He also has had a CT scan in April which showed there was post esophagectomy with gastric pull-up anatomy but no evidence of local recurrence. There is no evidence of mediastinal nodal metastasis. He did have small bilateral pulmonary nodules which were unchanged recommend routine surveillance. Otherwise the patient has a ventral hernia following the surgery but no evidence of obstruction or gangrene. Electronic Signature(s) Signed: 06/22/2021 5:13:03 PM By: Worthy Keeler PA-C Entered By: Worthy Keeler on 06/22/2021 17:13:03 -------------------------------------------------------------------------------- Physical Exam Details Patient Name: Date of Service: [name] 06/22/2021 2:45 PM Medical Record Number: [id number] Patient Account Number: [id number] Date of Birth/Sex: Treating RN: [birth year] (55 y.o. Thomas Gonzalez Primary Care Provider: [name] Other Clinician: Referring Provider: Treating Provider/Extender: [name] in Treatment: 0 Constitutional sitting or standing blood pressure is within target range for patient.. pulse regular and within target range for patient.Marland Kitchen respirations regular, non-labored and within target range for patient.Marland Kitchen temperature within target range for patient.. Well-nourished and well-hydrated in no acute  distress. Eyes conjunctiva clear no eyelid edema noted. pupils equal round and reactive to light and accommodation. Ears, Nose, Mouth, and Throat no gross abnormality of ear auricles or external auditory canals. normal hearing noted during conversation. mucus membranes moist. Respiratory normal breathing without difficulty. Cardiovascular no clubbing, cyanosis, significant edema, <3 sec cap refill. Musculoskeletal normal gait and posture. no significant deformity or arthritic changes, no loss or range of motion, no clubbing. Psychiatric this patient is able to make decisions and demonstrates good insight into disease process. Alert and Oriented x 3. pleasant and cooperative. Notes Upon inspection patient's wound bed actually showed signs of hypergranulation tissue. I did take a sample of what we were removing today to send for biopsy. With that being said I do not see anything that appears to be a significant issue at this point which is great news from the standpoint of overt evidence of local cancer. With that being said I do believe that the patient would benefit from chemical cauterization following debridement and I discussed that with him today he was in agreement with that plan and subsequently I did debride the entire surface area removing as much of the hypergranular tissue as I could. He did have fairly profuse bleeding which I was able to get cauterized with the silver nitrate and actually achieved complete hemostasis. Post debridement and chemical cauterization the area was much flatter I think we have a much better chance of trying to get to a good wound surface. That is going to be of utmost importance that we have any chance of getting this to heal appropriately. Electronic Signature(s) Signed: 06/22/2021 5:14:17 PM By: Worthy Keeler PA-C Entered By: Worthy Keeler on 06/22/2021  17:14:17 -------------------------------------------------------------------------------- Physician Orders Details Patient Name: Date of Service: [name] 06/22/2021 2:45 PM Medical Record Number: [id number] Patient Account Number: [id number] Date of Birth/Sex: Treating RN: Feb 21, 1966 (55 y.o. Thomas Gonzalez Primary Care Provider: [name] Other Clinician: Referring Provider: Treating Provider/Extender: [name] in Treatment: 0 Verbal / Phone Orders: No Diagnosis Coding ICD-10 Coding Code Description T81.31XA Disruption  of external operation (surgical) wound, not elsewhere classified, initial encounter L98.492 Non-pressure chronic ulcer of skin of other sites with fat layer exposed Z85.01 Personal history of malignant neoplasm of esophagus K43.9 Ventral hernia without obstruction or gangrene Follow-up Appointments ppointment in 1 week. - with Margarita Grizzle Return A Bathing/ Shower/ Hygiene May shower and wash wound with soap and water. Wound Treatment Wound #1 - Abdomen - midline Prim Dressing: Hydrofera Blue Ready Foam, 4x5 in Every Other Day/Other:90 days ary Discharge Instructions: Apply to wound bed as instructed Secondary Dressing: ABD Pad, 8x10 Every Other Day/Other:90 days Discharge Instructions: Apply over primary dressing as directed. Secured With: 23M Medipore H Soft Cloth Surgical T 4 x 2 (in/yd) Every Other Day/Other:90 days ape Discharge Instructions: Secure dressing with tape as directed. Laboratory Bacteria identified in Tissue by Biopsy culture (MICRO) - abdomen LOINC Code: [id number] Convenience Name: Biopsy specimen culture Electronic Signature(s) Signed: 06/22/2021 5:22:07 PM By: Worthy Keeler PA-C Signed: 06/22/2021 5:57:23 PM By: Baruch Gouty RN, BSN Entered By: Baruch Gouty on 06/22/2021 16:05:16 -------------------------------------------------------------------------------- Problem List Details Patient  Name: Date of Service: [name]. 06/22/2021 2:45 PM Medical Record Number: [id number] Patient Account Number: [id number] Date of Birth/Sex: Treating RN: November 14, 1966 (55 y.o. Thomas Gonzalez Primary Care Provider: [name] Other Clinician: Referring Provider: Treating Provider/Extender: [name] in Treatment: 0 Active Problems ICD-10 Encounter Code Description Active Date MDM Diagnosis T81.31XA Disruption of external operation (surgical) wound, not elsewhere classified, 06/22/2021 No Yes initial encounter L98.492 Non-pressure chronic ulcer of skin of other sites with fat layer exposed 06/22/2021 No Yes Z85.01 Personal history of malignant neoplasm of esophagus 06/22/2021 No Yes K43.9 Ventral hernia without obstruction or gangrene 06/22/2021 No Yes Inactive Problems Resolved Problems Electronic Signature(s) Signed: 06/22/2021 3:35:55 PM By: Worthy Keeler PA-C Previous Signature: 06/22/2021 3:35:20 PM Version By: Worthy Keeler PA-C Entered By: Worthy Keeler on 06/22/2021 15:35:54 -------------------------------------------------------------------------------- Progress Note Details Patient Name: Date of Service: [name]. 06/22/2021 2:45 PM Medical Record Number: [id number] Patient Account Number: [id number] Date of Birth/Sex: Treating RN: [birth year] (55 y.o. Thomas Gonzalez Primary Care Provider: [name] Other Clinician: Referring Provider: Treating Provider/Extender: [name] in Treatment: 0 Subjective Chief Complaint Information obtained from Patient Surgical Ulcer History of Present Illness (HPI) 06/22/2021 patient presents today as a pleasant gentleman who unfortunately has been having quite a bit of an issue with his abdominal area following surgery in 2021 November. 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 Subsequently he also was on immunotherapy  following to try to help with any residual carcinoma following the procedure. Nonetheless he had an issue with the incision site in the mid abdomen and unfortunately this ended up dehiscing and has had trouble since getting this healed. He has been seen by Dr. Tedra Gonzalez him where ACell was utilized but again there is really no response she referred the patient to [place] for further evaluation and treatment. No chemical cauterization with silver nitrate has been utilized. He also has not had any he tells me aggressive sharp debridement of the area to clear away some of this hypergranular tissue which he has significant amounts noted. Either way I think that we will get a need to try to see about removing this I do think, to send sample of this to biopsy just to make sure there is no signs of cancer noted although I really think that is probably  not can be the case I think we will get a get a report back stating hypergranulation tissue. With that being said the patient does seem to have quite a bit of bleeding and friability even with wiping over the wound area and again I think this is a big part of the issue again he can even grow new skin because anything attaching is attaching to the hypergranulation which is not even get a be anywhere close to sufficient for good epithelial growth. He also has had a CT scan in April which showed there was post esophagectomy with gastric pull-up anatomy but no evidence of local recurrence. There is no evidence of mediastinal nodal metastasis. He did have small bilateral pulmonary nodules which were unchanged recommend routine surveillance. Otherwise the patient has a ventral hernia following the surgery but no evidence of obstruction or gangrene. Patient History Information obtained from Patient. Allergies penicillin, prednisone Family History Cancer - Siblings, No family history of Diabetes, Heart Disease, Hereditary Spherocytosis, Hypertension, Kidney Disease, Lung  Disease, Seizures, Stroke, Thyroid Problems, Tuberculosis. Social History Former smoker, Marital Status - Married, Alcohol Use - Never, Drug Use - No History, Caffeine Use - Daily. Medical History Oncologic Patient has history of Received Chemotherapy - 09/20/20, Received Radiation Medical A Surgical History Notes nd Gastrointestinal esophageal carcinoma Review of Systems (ROS) Eyes Complains or has symptoms of Glasses / Contacts. Denies complaints or symptoms of Vision Changes. Ear/Nose/Mouth/Throat Denies complaints or symptoms of Chronic sinus problems or rhinitis. Respiratory Denies complaints or symptoms of Chronic or frequent coughs, Shortness of Breath. Cardiovascular Denies complaints or symptoms of Chest pain. Endocrine Denies complaints or symptoms of Heat/cold intolerance. Genitourinary Denies complaints or symptoms of Frequent urination. Integumentary (Skin) Complains or has symptoms of Wounds. Musculoskeletal Denies complaints or symptoms of Muscle Pain, Muscle Weakness. Neurologic Denies complaints or symptoms of Numbness/parasthesias. Psychiatric Denies complaints or symptoms of Claustrophobia, Suicidal. Objective Constitutional sitting or standing blood pressure is within target range for patient.. pulse regular and within target range for patient.Marland Kitchen respirations regular, non-labored and within target range for patient.Marland Kitchen temperature within target range for patient.. Well-nourished and well-hydrated in no acute distress. Vitals Time Taken: 2:57 PM, Height: 69 in, Source: Stated, Weight: 191 lbs, Source: Stated, BMI: 28.2, Temperature: 98.4 F, Pulse: 92 bpm, Respiratory Rate: 16 breaths/min, Blood Pressure: 115/79 mmHg. Eyes conjunctiva clear no eyelid edema noted. pupils equal round and reactive to light and accommodation. Ears, Nose, Mouth, and Throat no gross abnormality of ear auricles or external auditory canals. normal hearing noted during conversation.  mucus membranes moist. Respiratory normal breathing without difficulty. Cardiovascular no clubbing, cyanosis, significant edema, Musculoskeletal normal gait and posture. no significant deformity or arthritic changes, no loss or range of motion, no clubbing. Psychiatric this patient is able to make decisions and demonstrates good insight into disease process. Alert and Oriented x 3. pleasant and cooperative. General Notes: Upon inspection patient's wound bed actually showed signs of hypergranulation tissue. I did take a sample of what we were removing today to send for biopsy. With that being said I do not see anything that appears to be a significant issue at this point which is great news from the standpoint of overt evidence of local cancer. With that being said I do believe that the patient would benefit from chemical cauterization following debridement and I discussed that with him today he was in agreement with that plan and subsequently I did debride the entire surface area removing as much of the hypergranular tissue as  I could. He did have fairly profuse bleeding which I was able to get cauterized with the silver nitrate and actually achieved complete hemostasis. Post debridement and chemical cauterization the area was much flatter I think we have a much better chance of trying to get to a good wound surface. That is going to be of utmost importance that we have any chance of getting this to heal appropriately. Integumentary (Hair, Skin) Wound #1 status is Open. Original cause of wound was Surgical Injury. The date acquired was: 09/30/2020. The wound is located on the Abdomen - midline. The wound measures 8.5cm length x 7.8cm width x 0.1cm depth; 52.072cm^2 area and 5.207cm^3 volume. There is Fat Layer (Subcutaneous Tissue) exposed. There is no tunneling or undermining noted. There is a medium amount of purulent drainage noted. The wound margin is distinct with the outline attached to  the wound base. There is large (67-100%) red, hyper - granulation within the wound bed. There is no necrotic tissue within the wound bed. Assessment Active Problems ICD-10 Disruption of external operation (surgical) wound, not elsewhere classified, initial encounter Non-pressure chronic ulcer of skin of other sites with fat layer exposed Personal history of malignant neoplasm of esophagus Ventral hernia without obstruction or gangrene Procedures Wound #1 Pre-procedure diagnosis of Wound #1 is an Open Surgical Wound located on the Abdomen - midline . There was a Excisional Skin/Subcutaneous Tissue Debridement with a total area of 66.3 sq cm performed by Worthy Keeler, PA. With the following instrument(s): Curette, Forceps, and Scissors to remove Viable and Non-Viable tissue/material. Material removed includes Subcutaneous Tissue and Hyper-granulation and. 1 specimen was taken by a Tissue Culture and sent to the lab per facility protocol. A time out was conducted at 15:50, prior to the start of the procedure. A Minimum amount of bleeding was controlled with Silver Nitrate. The procedure was tolerated well with a pain level of 0 throughout and a pain level of 0 following the procedure. Post Debridement Measurements: 8.5cm length x 7.8cm width x 0.1cm depth; 5.207cm^3 volume. Character of Wound/Ulcer Post Debridement is improved. Post procedure Diagnosis Wound #1: Same as Pre-Procedure Plan Follow-up Appointments: Return Appointment in 1 week. - with Glynn Octave Shower/ Hygiene: May shower and wash wound with soap and water. Laboratory ordered were: Biopsy specimen - abdomen WOUND #1: - Abdomen - midline Wound Laterality: Prim Dressing: Hydrofera Blue Ready Foam, 4x5 in Every Other Day/Other:90 days ary Discharge Instructions: Apply to wound bed as instructed Secondary Dressing: ABD Pad, 8x10 Every Other Day/Other:90 days Discharge Instructions: Apply over primary dressing as  directed. Secured With: 107M Medipore H Soft Cloth Surgical T 4 x 2 (in/yd) Every Other Day/Other:90 days ape Discharge Instructions: Secure dressing with tape as directed. 1. Would recommend currently that we initiate treatment with [facility] which I think is good to be the best way to go and the patient is in agreement with that plan. 2. I am also can recommend that we have the patient continue to monitor for any signs of worsening or infection such as increased pain, erythema, or drainage. Again he may have some increased bleeding with dressing changes but that is completely normal I discussed that with him today as well. We will see patient back for reevaluation in 1 week here in the clinic. If anything worsens or changes patient will contact our office for additional recommendations. Electronic Signature(s) Signed: 06/22/2021 5:14:44 PM By: Worthy Keeler PA-C Entered By: Worthy Keeler on 06/22/2021 17:14:44 -------------------------------------------------------------------------------- HxROS Details  Patient Name: Date of Service: [name] 06/22/2021 2:45 PM Medical Record Number: [id number] Patient Account Number: [id number] Date of Birth/Sex: Treating RN: 26-Oct-1966 (55 y.o. [name] Primary Care Provider: [name] Other Clinician: Referring Provider: Treating Provider/Extender: [name] in Treatment: 0 Information Obtained From Patient Eyes Complaints and Symptoms: Positive for: Glasses / Contacts Negative for: Vision Changes Ear/Nose/Mouth/Throat Complaints and Symptoms: Negative for: Chronic sinus problems or rhinitis Respiratory Complaints and Symptoms: Negative for: Chronic or frequent coughs; Shortness of Breath Cardiovascular Complaints and Symptoms: Negative for: Chest pain Endocrine Complaints and Symptoms: Negative for: Heat/cold intolerance Genitourinary Complaints and Symptoms: Negative for:  Frequent urination Integumentary (Skin) Complaints and Symptoms: Positive for: Wounds Musculoskeletal Complaints and Symptoms: Negative for: Muscle Pain; Muscle Weakness Neurologic Complaints and Symptoms: Negative for: Numbness/parasthesias Psychiatric Complaints and Symptoms: Negative for: Claustrophobia; Suicidal Hematologic/Lymphatic Gastrointestinal Medical History: Past Medical History Notes: esophageal carcinoma Immunological Oncologic Medical History: Positive for: Received Chemotherapy - 09/20/20; Received Radiation Immunizations Pneumococcal Vaccine: Received Pneumococcal Vaccination: No Implantable Devices Yes Family and Social History Cancer: Yes - Siblings; Diabetes: No; Heart Disease: No; Hereditary Spherocytosis: No; Hypertension: No; Kidney Disease: No; Lung Disease: No; Seizures: No; Stroke: No; Thyroid Problems: No; Tuberculosis: No; Former smoker; Marital Status - Married; Alcohol Use: Never; Drug Use: No History; Caffeine Use: Daily; Financial Concerns: No; Food, Clothing or Shelter Needs: No; Support System Lacking: No; Transportation Concerns: No Electronic Signature(s) Signed: 06/22/2021 5:22:07 PM By: Worthy Keeler PA-C Signed: 06/22/2021 5:27:26 PM By: Lorrin Jackson Entered By: Lorrin Jackson on 06/22/2021 15:05:28 -------------------------------------------------------------------------------- SuperBill Details Patient Name: Date of Service: [name] 06/22/2021 Medical Record Number: [id number] Patient Account Number: [id number] Date of Birth/Sex: Treating RN: [birth year] (55 y.o. Thomas Gonzalez Primary Care Provider: [name] Other Clinician: Referring Provider: Treating Provider/Extender: [name] in Treatment: 0 Diagnosis Coding ICD-10 Codes Code Description T81.31XA Disruption of external operation (surgical) wound, not elsewhere classified, initial encounter L98.492 Non-pressure chronic  ulcer of skin of other sites with fat layer exposed Z85.01 Personal history of malignant neoplasm of esophagus K43.9 Ventral hernia without obstruction or gangrene Facility Procedures CPT4 Code: [id number] Description: [facility] VISIT-LEV 3 EST PT Modifier: 25 Quantity: 1 CPT4 Code: [id number] Description: [facility] - DEB SUBQ TISSUE 20 SQ CM/< ICD-10 Diagnosis Description L98.492 Non-pressure chronic ulcer of skin of other sites with fat layer exposed Modifier: Quantity: 1 Physician Procedures : CPT4 Code Description Modifier [id number] - WC PHYS LEVEL 4 - NEW PT 25 ICD-10 Diagnosis Description T81.31XA Disruption of external operation (surgical) wound, not elsewhere classified, initial encounter L98.492 Non-pressure chronic ulcer of skin  of other sites with fat layer exposed Z85.01 Personal history of malignant neoplasm of esophagus K43.9 Ventral hernia without obstruction or gangrene Quantity: 1 : [id number] - WC PHYS SUBQ TISS 20 SQ CM ICD-10 Diagnosis Description L98.492 Non-pressure chronic ulcer of skin of other sites with fat layer exposed Quantity: 1 : [id number] - WC PHYS SUBQ TISS EA ADDL 20 CM ICD-10 Diagnosis Description L98.492 Non-pressure chronic ulcer of skin of other sites with fat layer exposed Quantity: 3 Electronic Signature(s) Signed: 06/22/2021 5:15:27 PM By: Worthy Keeler PA-C Entered By: Worthy Keeler on 06/22/2021 17:15:27

## 2021-06-22 NOTE — Progress Notes (Signed)
Thomas Gonzalez, Thomas Gonzalez ([id number]) Visit Report for 06/22/2021 Allergy List Details Patient Name: Date of Service: [name] 06/22/2021 2:45 PM Medical Record Number: [id number] Patient Account Number: [id number] Date of Birth/Sex: Treating RN: [birth year] (55 y.o. Thomas Gonzalez Primary Care Jacub Waiters: [name] Other Clinician: Referring Maday Guarino: Treating Peytyn Trine/Extender: [name] in Treatment: 0 Allergies Active Allergies penicillin prednisone Allergy Notes Electronic Signature(s) Signed: 06/22/2021 5:27:26 PM By: Lorrin Jackson Entered By: Lorrin Jackson on 06/22/2021 15:00:13 -------------------------------------------------------------------------------- Arrival Information Details Patient Name: Date of Service: [name]. 06/22/2021 2:45 PM Medical Record Number: [id number] Patient Account Number: [id number] Date of Birth/Sex: Treating RN: June 28, 1966 (55 y.o. Thomas Gonzalez Primary Care Anniebelle Devore: [name] Other Clinician: Referring Crescent Gotham: Treating Nohlan Burdin/Extender: [name] in Treatment: 0 Visit Information Patient Arrived: Ambulatory Arrival Time: 14:53 Accompanied By: wife Transfer Assistance: None Patient Identification Verified: Yes Secondary Verification Process Completed: Yes Patient Requires Transmission-Based Precautions: No Patient Has Alerts: No Electronic Signature(s) Signed: 06/22/2021 5:27:26 PM By: Lorrin Jackson Entered By: Lorrin Jackson on 06/22/2021 14:57:46 -------------------------------------------------------------------------------- Clinic Level of Care Assessment Details Patient Name: Date of Service: [name] 06/22/2021 2:45 PM Medical Record Number: [id number] Patient Account Number: [id number] Date of Birth/Sex: Treating RN: 1965-12-04 (55 y.o. [name] Primary Care Sanaii Caporaso: [name] Other Clinician: Referring  Liahna Brickner: Treating Ginelle Bays/Extender: [name] in Treatment: 0 Clinic Level of Care Assessment Items TOOL 1 Quantity Score '[]'$  - 0 Use when EandM and Procedure is performed on INITIAL visit ASSESSMENTS - Nursing Assessment / Reassessment X- 1 20 General Physical Exam (combine w/ comprehensive assessment (listed just below) when performed on new pt. evals) X- 1 25 Comprehensive Assessment (HX, ROS, Risk Assessments, Wounds Hx, etc.) ASSESSMENTS - Wound and Skin Assessment / Reassessment '[]'$  - 0 Dermatologic / Skin Assessment (not related to wound area) ASSESSMENTS - Ostomy and/or Continence Assessment and Care '[]'$  - 0 Incontinence Assessment and Management '[]'$  - 0 Ostomy Care Assessment and Management (repouching, etc.) PROCESS - Coordination of Care X - Simple Patient / Family Education for ongoing care 1 15 '[]'$  - 0 Complex (extensive) Patient / Family Education for ongoing care X- 1 10 Staff obtains Programmer, systems, Records, T Results / Process Orders est '[]'$  - 0 Staff telephones HHA, Nursing Homes / Clarify orders / etc '[]'$  - 0 Routine Transfer to another Facility (non-emergent condition) '[]'$  - 0 Routine Hospital Admission (non-emergent condition) X- 1 15 New Admissions / Biomedical engineer / Ordering NPWT Apligraf, etc. , '[]'$  - 0 Emergency Hospital Admission (emergent condition) PROCESS - Special Needs '[]'$  - 0 Pediatric / Minor Patient Management '[]'$  - 0 Isolation Patient Management '[]'$  - 0 Hearing / Language / Visual special needs '[]'$  - 0 Assessment of Community assistance (transportation, D/C planning, etc.) '[]'$  - 0 Additional assistance / Altered mentation '[]'$  - 0 Support Surface(s) Assessment (bed, cushion, seat, etc.) INTERVENTIONS - Miscellaneous '[]'$  - 0 External ear exam '[]'$  - 0 Patient Transfer (multiple staff / Civil Service fast streamer / Similar devices) '[]'$  - 0 Simple Staple / Suture removal (25 or less) '[]'$  - 0 Complex Staple / Suture removal (26  or more) '[]'$  - 0 Hypo/Hyperglycemic Management (do not check if billed separately) '[]'$  - 0 Ankle / Brachial Index (ABI) - do not check if billed separately Has the patient been seen at the hospital within the last three years: Yes Total Score: 85 Level Of Care: New/Established -  Level 3 Electronic Signature(s) Signed: 06/22/2021 5:57:23 PM By: Baruch Gouty RN, BSN Entered By: Baruch Gouty on 06/22/2021 15:45:29 -------------------------------------------------------------------------------- Encounter Discharge Information Details Patient Name: Date of Service: [name]. 06/22/2021 2:45 PM Medical Record Number: [id number] Patient Account Number: [id number] Date of Birth/Sex: Treating RN: 19-Sep-1966 (55 y.o. Thomas Gonzalez Primary Care Royann Wildasin: [name] Other Clinician: Referring Hayven Fatima: Treating Kassim Guertin/Extender: [name] in Treatment: 0 Encounter Discharge Information Items Post Procedure Vitals Discharge Condition: Stable Temperature (F): 98.4 Ambulatory Status: Ambulatory Pulse (bpm): 84 Discharge Destination: Home Respiratory Rate (breaths/min): 16 Transportation: Private Auto Blood Pressure (mmHg): 115/79 Accompanied By: wife Schedule Follow-up Appointment: Yes Clinical Summary of Care: Electronic Signature(s) Signed: 06/22/2021 4:43:23 PM By: Deon Pilling Entered By: Deon Pilling on 06/22/2021 16:36:38 -------------------------------------------------------------------------------- Lower Extremity Assessment Details Patient Name: Date of Service: [name] 06/22/2021 2:45 PM Medical Record Number: [id number] Patient Account Number: [id number] Date of Birth/Sex: Treating RN: [birth year] (55 y.o. Thomas Gonzalez Primary Care Debroh Sieloff: [name] Other Clinician: Referring Kendra Grissett: Treating Valeen Borys/Extender: [name] in Treatment: 0 Electronic Signature(s) Signed:  06/22/2021 5:27:26 PM By: Lorrin Jackson Entered By: Lorrin Jackson on 06/22/2021 15:07:29 -------------------------------------------------------------------------------- Multi Wound Chart Details Patient Name: Date of Service: [name]. 06/22/2021 2:45 PM Medical Record Number: [id number] Patient Account Number: [id number] Date of Birth/Sex: Treating RN: 1966/04/23 (55 y.o. [name] Primary Care Lusine Corlett: [name] Other Clinician: Referring Tameaka Eichhorn: Treating Trinisha Paget/Extender: [name] in Treatment: 0 Vital Signs Height(in): 69 Pulse(bpm): 92 Weight(lbs): 191 Blood Pressure(mmHg): 115/79 Body Mass Index(BMI): 28 Temperature(F): 98.4 Respiratory Rate(breaths/min): 16 Photos: [N/A:N/A] Abdomen - midline N/A N/A Wound Location: Surgical Injury N/A N/A Wounding Event: Open Surgical Wound N/A N/A Primary Etiology: Received Chemotherapy, Received N/A N/A Comorbid History: Radiation 09/30/2020 N/A N/A Date Acquired: 0 N/A N/A Weeks of Treatment: Open N/A N/A Wound Status: 8.5x7.8x0.1 N/A N/A Measurements L x W x D (cm) 52.072 N/A N/A A (cm) : rea 5.207 N/A N/A Volume (cm) : 0.00% N/A N/A % Reduction in A rea: 0.00% N/A N/A % Reduction in Volume: Full Thickness Without Exposed N/A N/A Classification: Support Structures Medium N/A N/A Exudate A mount: Purulent N/A N/A Exudate Type: yellow, brown, green N/A N/A Exudate Color: Distinct, outline attached N/A N/A Wound Margin: Large (67-100%) N/A N/A Granulation A mount: Red, Hyper-granulation N/A N/A Granulation Quality: None Present (0%) N/A N/A Necrotic A mount: Fat Layer (Subcutaneous Tissue): Yes N/A N/A Exposed Structures: Fascia: No Tendon: No Muscle: No Joint: No Bone: No None N/A N/A Epithelialization: Debridement - Excisional N/A N/A Debridement: Pre-procedure Verification/Time Out 15:50 N/A N/A Taken: Subcutaneous N/A N/A Tissue  Debrided: Skin/Subcutaneous Tissue N/A N/A Level: 66.3 N/A N/A Debridement A (sq cm): rea Curette, Forceps, Scissors N/A N/A Instrument: Minimum N/A N/A Bleeding: Silver Nitrate N/A N/A Hemostasis A chieved: 0 N/A N/A Procedural Pain: 0 N/A N/A Post Procedural Pain: Procedure was tolerated well N/A N/A Debridement Treatment Response: 8.5x7.8x0.1 N/A N/A Post Debridement Measurements L x W x D (cm) 5.207 N/A N/A Post Debridement Volume: (cm) Debridement N/A N/A Procedures Performed: Treatment Notes Wound #1 (Abdomen - midline) Cleanser Peri-Wound Care Topical Primary Dressing Hydrofera Blue Ready Foam, 4x5 in Discharge Instruction: Apply to wound bed as instructed Secondary Dressing ABD Pad, 8x10 Discharge Instruction: Apply over primary dressing as directed. Secured With 65M Medipore H Soft Cloth Surgical T 4 x 2 (in/yd) ape Discharge Instruction: Secure  dressing with tape as directed. Compression Wrap Compression Stockings Add-Ons Electronic Signature(s) Signed: 06/22/2021 5:57:23 PM By: Baruch Gouty RN, BSN Entered By: Baruch Gouty on 06/22/2021 17:11:09 -------------------------------------------------------------------------------- Multi-Disciplinary Care Plan Details Patient Name: Date of Service: [name]. 06/22/2021 2:45 PM Medical Record Number: [id number] Patient Account Number: [id number] Date of Birth/Sex: Treating RN: 01-Oct-1966 (55 y.o. [name] Primary Care Nassir Neidert: [name] Other Clinician: Referring Clair Alfieri: Treating Jilda Kress/Extender: [name] in Treatment: 0 Multidisciplinary Care Plan reviewed with physician Active Inactive Wound/Skin Impairment Nursing Diagnoses: Impaired tissue integrity Knowledge deficit related to ulceration/compromised skin integrity Goals: Patient/caregiver will verbalize understanding of skin care regimen Date Initiated: 06/22/2021 Target Resolution  Date: 07/20/2021 Goal Status: Active Ulcer/skin breakdown will have a volume reduction of 30% by week 4 Date Initiated: 06/22/2021 Target Resolution Date: 07/20/2021 Goal Status: Active Interventions: Assess patient/caregiver ability to obtain necessary supplies Assess patient/caregiver ability to perform ulcer/skin care regimen upon admission and as needed Assess ulceration(s) every visit Provide education on ulcer and skin care Treatment Activities: Skin care regimen initiated : 06/22/2021 Topical wound management initiated : 06/22/2021 Notes: Electronic Signature(s) Signed: 06/22/2021 5:57:23 PM By: Baruch Gouty RN, BSN Entered By: Baruch Gouty on 06/22/2021 15:44:22 -------------------------------------------------------------------------------- Pain Assessment Details Patient Name: Date of Service: Gentry Roch. 06/22/2021 2:45 PM Medical Record Number: [id number] Patient Account Number: [id number] Date of Birth/Sex: Treating RN: 1966/03/10 (55 y.o. Thomas Gonzalez Primary Care Jakayla Schweppe: [name] Other Clinician: Referring Samantha Ragen: Treating Analysse Quinonez/Extender: [name] in Treatment: 0 Active Problems Location of Pain Severity and Description of Pain Patient Has Paino No Site Locations Pain Management and Medication Current Pain Management: Electronic Signature(s) Signed: 06/22/2021 5:27:26 PM By: Lorrin Jackson Entered By: Lorrin Jackson on 06/22/2021 15:17:39 -------------------------------------------------------------------------------- Patient/Caregiver Education Details Patient Name: Date of Service: [name] 8/3/2022andnbsp2:45 PM Medical Record Number: [id number] Patient Account Number: [id number] Date of Birth/Gender: Treating RN: [birth year] (55 y.o. [name] Primary Care Physician: [name] Other Clinician: Referring Physician: Treating Physician/Extender: [name] in Treatment: 0 Education Assessment Education Provided To: Patient Education Topics Provided Wound/Skin Impairment: Handouts: Caring for Your Ulcer, Skin Care Do's and Dont's Methods: Explain/Verbal, Printed Responses: Reinforcements needed, State content correctly Electronic Signature(s) Signed: 06/22/2021 5:57:23 PM By: Baruch Gouty RN, BSN Entered By: Baruch Gouty on 06/22/2021 15:44:51 -------------------------------------------------------------------------------- Wound Assessment Details Patient Name: Date of Service: [name]. 06/22/2021 2:45 PM Medical Record Number: [id number] Patient Account Number: [id number] Date of Birth/Sex: Treating RN: 07/28/1966 (55 y.o. Thomas Gonzalez Primary Care Edana Aguado: [name] Other Clinician: Referring Diontay Rosencrans: Treating Aubriauna Riner/Extender: [name] in Treatment: 0 Wound Status Wound Number: 1 Primary Etiology: Open Surgical Wound Wound Location: Abdomen - midline Wound Status: Open Wounding Event: Surgical Injury Comorbid History: Received Chemotherapy, Received Radiation Date Acquired: 09/30/2020 Weeks Of Treatment: 0 Clustered Wound: No Photos Wound Measurements Length: (cm) 8.5 Width: (cm) 7.8 Depth: (cm) 0.1 Area: (cm) 52.072 Volume: (cm) 5.207 % Reduction in Area: 0% % Reduction in Volume: 0% Epithelialization: None Tunneling: No Undermining: No Wound Description Classification: Full Thickness Without Exposed Support Structures Wound Margin: Distinct, outline attached Exudate Amount: Medium Exudate Type: Purulent Exudate Color: yellow, brown, green Foul Odor After Cleansing: No Slough/Fibrino No Wound Bed Granulation Amount: Large (67-100%) Exposed Structure Granulation Quality: Red, Hyper-granulation Fascia Exposed: No Necrotic Amount: None Present (0%) Fat Layer (Subcutaneous Tissue) Exposed: Yes Tendon  Exposed: No Muscle Exposed:  No Joint Exposed: No Bone Exposed: No Treatment Notes Wound #1 (Abdomen - midline) Cleanser Peri-Wound Care Topical Primary Dressing Hydrofera Blue Ready Foam, 4x5 in Discharge Instruction: Apply to wound bed as instructed Secondary Dressing ABD Pad, 8x10 Discharge Instruction: Apply over primary dressing as directed. Secured With 49M Medipore H Soft Cloth Surgical T 4 x 2 (in/yd) ape Discharge Instruction: Secure dressing with tape as directed. Compression Wrap Compression Stockings Add-Ons Electronic Signature(s) Signed: 06/22/2021 5:27:26 PM By: Lorrin Jackson Entered By: Lorrin Jackson on 06/22/2021 15:17:07 -------------------------------------------------------------------------------- Vitals Details Patient Name: Date of Service: [name]. 06/22/2021 2:45 PM Medical Record Number: [id number] Patient Account Number: [id number] Date of Birth/Sex: Treating RN: 1966-08-22 (55 y.o. Thomas Gonzalez Primary Care Lataysha Vohra: [name] Other Clinician: Referring Chue Berkovich: Treating Jlyn Bracamonte/Extender: [name] in Treatment: 0 Vital Signs Time Taken: 14:57 Temperature (F): 98.4 Height (in): 69 Pulse (bpm): 92 Source: Stated Respiratory Rate (breaths/min): 16 Weight (lbs): 191 Blood Pressure (mmHg): 115/79 Source: Stated Reference Range: 80 - 120 mg / dl Body Mass Index (BMI): 28.2 Electronic Signature(s) Signed: 06/22/2021 5:27:26 PM By: Lorrin Jackson Entered By: Lorrin Jackson on 06/22/2021 15:00:02

## 2021-06-23 ENCOUNTER — Inpatient Hospital Stay: Payer: 59

## 2021-06-23 ENCOUNTER — Other Ambulatory Visit: Payer: Self-pay

## 2021-06-23 DIAGNOSIS — L980 Pyogenic granuloma: Secondary | ICD-10-CM | POA: Diagnosis not present

## 2021-06-23 LAB — T4: T4, Total: 6.4 ug/dL (ref 4.5–12.0)

## 2021-06-24 ENCOUNTER — Other Ambulatory Visit: Payer: Self-pay | Admitting: Hematology and Oncology

## 2021-06-24 ENCOUNTER — Encounter: Payer: Self-pay | Admitting: Oncology

## 2021-06-24 DIAGNOSIS — S31109D Unspecified open wound of abdominal wall, unspecified quadrant without penetration into peritoneal cavity, subsequent encounter: Secondary | ICD-10-CM

## 2021-06-24 DIAGNOSIS — C159 Malignant neoplasm of esophagus, unspecified: Secondary | ICD-10-CM

## 2021-06-24 NOTE — Telephone Encounter (Signed)
Received fax notification from [employer] stating that items ordered are not covered under his ins and that they were going to contact the pt with pricing options.

## 2021-06-27 ENCOUNTER — Encounter: Payer: Self-pay | Admitting: Oncology

## 2021-06-28 ENCOUNTER — Ambulatory Visit (INDEPENDENT_AMBULATORY_CARE_PROVIDER_SITE_OTHER): Payer: 59 | Admitting: Surgical

## 2021-06-28 ENCOUNTER — Other Ambulatory Visit: Payer: Self-pay

## 2021-06-28 ENCOUNTER — Encounter: Payer: Self-pay | Admitting: Plastic Surgery

## 2021-06-28 ENCOUNTER — Encounter: Payer: Self-pay | Admitting: Oncology

## 2021-06-28 ENCOUNTER — Encounter (HOSPITAL_BASED_OUTPATIENT_CLINIC_OR_DEPARTMENT_OTHER): Payer: 59 | Admitting: Physician Assistant

## 2021-06-28 DIAGNOSIS — Z923 Personal history of irradiation: Secondary | ICD-10-CM | POA: Diagnosis not present

## 2021-06-28 DIAGNOSIS — S31109D Unspecified open wound of abdominal wall, unspecified quadrant without penetration into peritoneal cavity, subsequent encounter: Secondary | ICD-10-CM

## 2021-06-28 DIAGNOSIS — T8131XA Disruption of external operation (surgical) wound, not elsewhere classified, initial encounter: Secondary | ICD-10-CM | POA: Diagnosis not present

## 2021-06-28 NOTE — Progress Notes (Signed)
Thomas, Gonzalez ([id number]) Visit Report for 06/28/2021 Arrival Information Details Patient Name: Date of Service: [name] 06/28/2021 10:30 A M Medical Record Number: [id number] Patient Account Number: [id number] Date of Birth/Sex: Treating RN: Sep 13, 1966 (55 y.o. Thomas Gonzalez Primary Care Thomas Gonzalez: Thomas Gonzalez Other Clinician: Referring Thomas Gonzalez: Treating Thomas Gonzalez/Extender: Thomas Gonzalez in Treatment: 0 Visit Information History Since Last Visit Added or deleted any medications: No Patient Arrived: Ambulatory Any new allergies or adverse reactions: No Arrival Time: 10:25 Had a fall or experienced change in No Accompanied By: spouse activities of daily living that may affect Transfer Assistance: None risk of falls: Patient Requires Transmission-Based Precautions: No Signs or symptoms of abuse/neglect since last visito No Patient Has Alerts: No Hospitalized since last visit: No Implantable device outside of the clinic excluding No cellular tissue based products placed in the center since last visit: Has Dressing in Place as Prescribed: Yes Pain Present Now: No Electronic Signature(s) Signed: 06/28/2021 5:39:27 PM By: Baruch Gouty RN, BSN Entered By: Baruch Gouty on 06/28/2021 10:26:42 -------------------------------------------------------------------------------- Clinic Level of Care Assessment Details Patient Name: Date of Service: [name] 06/28/2021 10:30 A M Medical Record Number: [id number] Patient Account Number: [id number] Date of Birth/Sex: Treating RN: [birth year] (55 y.o. Thomas Gonzalez, Thomas Gonzalez Primary Care Eyleen Rawlinson: Thomas Gonzalez Other Clinician: Referring Thomas Gonzalez: Treating Ritvik Mczeal/Extender: Thomas Gonzalez in Treatment: 0 Clinic Level of Care Assessment Items TOOL 4 Quantity Score X- 1 0 Use when only an EandM is performed on FOLLOW-UP visit ASSESSMENTS - Nursing Assessment /  Reassessment X- 1 10 Reassessment of Co-morbidities (includes updates in patient status) X- 1 5 Reassessment of Adherence to Treatment Plan ASSESSMENTS - Wound and Skin A ssessment / Reassessment X - Simple Wound Assessment / Reassessment - one wound 1 5 '[]'$  - 0 Complex Wound Assessment / Reassessment - multiple wounds X- 1 10 Dermatologic / Skin Assessment (not related to wound area) ASSESSMENTS - Focused Assessment '[]'$  - 0 Circumferential Edema Measurements - multi extremities '[]'$  - 0 Nutritional Assessment / Counseling / Intervention '[]'$  - 0 Lower Extremity Assessment (monofilament, tuning fork, pulses) '[]'$  - 0 Peripheral Arterial Disease Assessment (using hand held doppler) ASSESSMENTS - Ostomy and/or Continence Assessment and Care '[]'$  - 0 Incontinence Assessment and Management '[]'$  - 0 Ostomy Care Assessment and Management (repouching, etc.) PROCESS - Coordination of Care X - Simple Patient / Family Education for ongoing care 1 15 '[]'$  - 0 Complex (extensive) Patient / Family Education for ongoing care X- 1 10 Staff obtains Programmer, systems, Records, T Results / Process Orders est '[]'$  - 0 Staff telephones HHA, Nursing Homes / Clarify orders / etc '[]'$  - 0 Routine Transfer to another Facility (non-emergent condition) '[]'$  - 0 Routine Hospital Admission (non-emergent condition) '[]'$  - 0 New Admissions / Biomedical engineer / Ordering NPWT Apligraf, etc. , '[]'$  - 0 Emergency Hospital Admission (emergent condition) X- 1 10 Simple Discharge Coordination '[]'$  - 0 Complex (extensive) Discharge Coordination PROCESS - Special Needs '[]'$  - 0 Pediatric / Minor Patient Management '[]'$  - 0 Isolation Patient Management '[]'$  - 0 Hearing / Language / Visual special needs '[]'$  - 0 Assessment of Community assistance (transportation, D/C planning, etc.) '[]'$  - 0 Additional assistance / Altered mentation '[]'$  - 0 Support Surface(s) Assessment (bed, cushion, seat, etc.) INTERVENTIONS - Wound Cleansing /  Measurement X - Simple Wound Cleansing - one wound 1 5 '[]'$  - 0 Complex Wound Cleansing - multiple wounds X- 1 5  Wound Imaging (photographs - any number of wounds) '[]'$  - 0 Wound Tracing (instead of photographs) X- 1 5 Simple Wound Measurement - one wound '[]'$  - 0 Complex Wound Measurement - multiple wounds INTERVENTIONS - Wound Dressings '[]'$  - 0 Small Wound Dressing one or multiple wounds '[]'$  - 0 Medium Wound Dressing one or multiple wounds '[]'$  - 0 Large Wound Dressing one or multiple wounds '[]'$  - 0 Application of Medications - topical '[]'$  - 0 Application of Medications - injection INTERVENTIONS - Miscellaneous '[]'$  - 0 External ear exam '[]'$  - 0 Specimen Collection (cultures, biopsies, blood, body fluids, etc.) '[]'$  - 0 Specimen(s) / Culture(s) sent or taken to Lab for analysis '[]'$  - 0 Patient Transfer (multiple staff / Civil Service fast streamer / Similar devices) '[]'$  - 0 Simple Staple / Suture removal (25 or less) '[]'$  - 0 Complex Staple / Suture removal (26 or more) '[]'$  - 0 Hypo / Hyperglycemic Management (close monitor of Blood Glucose) '[]'$  - 0 Ankle / Brachial Index (ABI) - do not check if billed separately X- 1 5 Vital Signs Has the patient been seen at the hospital within the last three years: Yes Total Score: 85 Level Of Care: New/Established - Level 3 Electronic Signature(s) Signed: 06/28/2021 5:35:30 PM By: Thomas Hammock RN Entered By: Thomas Gonzalez on 06/28/2021 11:09:11 -------------------------------------------------------------------------------- Encounter Discharge Information Details Patient Name: Date of Service: [name] 06/28/2021 10:30 A M Medical Record Number: [id number] Patient Account Number: [id number] Date of Birth/Sex: Treating RN: November 02, 1966 (55 y.o. Thomas Gonzalez Primary Care Thomas Gonzalez: Thomas Gonzalez Other Clinician: Referring Thomas Gonzalez: Treating Thomas Gonzalez/Extender: Thomas Gonzalez in Treatment: 0 Encounter Discharge  Information Items Discharge Condition: Stable Ambulatory Status: Ambulatory Discharge Destination: Home Transportation: Private Auto Accompanied By: wife Schedule Follow-up Appointment: Yes Clinical Summary of Care: Electronic Signature(s) Signed: 06/28/2021 5:41:18 PM By: Deon Pilling Entered By: Deon Pilling on 06/28/2021 11:18:43 -------------------------------------------------------------------------------- Lower Extremity Assessment Details Patient Name: Date of Service: [name] 06/28/2021 10:30 A M Medical Record Number: [id number] Patient Account Number: [id number] Date of Birth/Sex: Treating RN: May 22, 1966 (55 y.o. Thomas Gonzalez Primary Care Brayn Eckstein: Thomas Gonzalez Other Clinician: Referring Jermeka Schlotterbeck: Treating Dylynn Ketner/Extender: Thomas Gonzalez in Treatment: 0 Electronic Signature(s) Signed: 06/28/2021 5:39:27 PM By: Baruch Gouty RN, BSN Entered By: Baruch Gouty on 06/28/2021 10:29:11 -------------------------------------------------------------------------------- Multi-Disciplinary Care Plan Details Patient Name: Date of Service: [name] 06/28/2021 10:30 A M Medical Record Number: [id number] Patient Account Number: [id number] Date of Birth/Sex: Treating RN: 1965/12/16 (55 y.o. Thomas Gonzalez, Thomas Gonzalez Primary Care Milinda Sweeney: Thomas Gonzalez Other Clinician: Referring Sueo Cullen: Treating Marnae Madani/Extender: Thomas Gonzalez in Treatment: 0 Multidisciplinary Care Plan reviewed with physician Active Inactive Wound/Skin Impairment Nursing Diagnoses: Impaired tissue integrity Knowledge deficit related to ulceration/compromised skin integrity Goals: Patient/caregiver will verbalize understanding of skin care regimen Date Initiated: 06/22/2021 Target Resolution Date: 07/20/2021 Goal Status: Active Ulcer/skin breakdown will have a volume reduction of 30% by week 4 Date Initiated: 06/22/2021 Target  Resolution Date: 07/20/2021 Goal Status: Active Interventions: Assess patient/caregiver ability to obtain necessary supplies Assess patient/caregiver ability to perform ulcer/skin care regimen upon admission and as needed Assess ulceration(s) every visit Provide education on ulcer and skin care Treatment Activities: Skin care regimen initiated : 06/22/2021 Topical wound management initiated : 06/22/2021 Notes: Electronic Signature(s) Signed: 06/28/2021 5:35:30 PM By: Thomas Hammock RN Entered By: Thomas Gonzalez on 06/28/2021 08:35:10 -------------------------------------------------------------------------------- Pain Assessment Details Patient Name: Date of Service: [place] SSIE,  GREGO RY K. 06/28/2021 10:30 A M Medical Record Number: [id number] Patient Account Number: [id number] Date of Birth/Sex: Treating RN: August 09, 1966 (55 y.o. Thomas Gonzalez Primary Care Reco Shonk: Thomas Gonzalez Other Clinician: Referring Azhia Siefken: Treating Tarissa Kerin/Extender: Thomas Gonzalez in Treatment: 0 Active Problems Location of Pain Severity and Description of Pain Patient Has Paino No Site Locations Rate the pain. Rate the pain. Current Pain Level: 0 Pain Management and Medication Current Pain Management: Electronic Signature(s) Signed: 06/28/2021 5:39:27 PM By: Baruch Gouty RN, BSN Entered By: Baruch Gouty on 06/28/2021 10:28:59 -------------------------------------------------------------------------------- Patient/Caregiver Education Details Patient Name: Date of Service: [name] 8/9/2022andnbsp10:30 A M Medical Record Number: [id number] Patient Account Number: [id number] Date of Birth/Gender: Treating RN: 02/01/1966 (55 y.o. Erie Noe Primary Care Physician: Thomas Gonzalez Other Clinician: Referring Physician: Treating Physician/Extender: Thomas Gonzalez in Treatment: 0 Education Assessment Education Provided  To: Patient Education Topics Provided Wound/Skin Impairment: Methods: Explain/Verbal Responses: State content correctly Electronic Signature(s) Signed: 06/28/2021 5:35:30 PM By: Thomas Hammock RN Entered By: Thomas Gonzalez on 06/28/2021 08:35:30 -------------------------------------------------------------------------------- Wound Assessment Details Patient Name: Date of Service: [name]. 06/28/2021 10:30 A M Medical Record Number: [id number] Patient Account Number: [id number] Date of Birth/Sex: Treating RN: 24-Apr-1966 (55 y.o. Thomas Gonzalez Primary Care Caydence Koenig: Thomas Gonzalez Other Clinician: Referring Arihana Ambrocio: Treating Joan Avetisyan/Extender: Thomas Gonzalez in Treatment: 0 Wound Status Wound Number: 1 Primary Etiology: Open Surgical Wound Wound Location: Abdomen - midline Wound Status: Open Wounding Event: Surgical Injury Date Acquired: 09/30/2020 Weeks Of Treatment: 0 Clustered Wound: No Wound Measurements Length: (cm) 9.8 Width: (cm) 6.5 Depth: (cm) 0.1 Area: (cm) 50.03 Volume: (cm) 5.003 % Reduction in Area: 3.9% % Reduction in Volume: 3.9% Wound Description Classification: Full Thickness Without Exposed Support Structur es Electronic Signature(s) Signed: 06/28/2021 5:39:27 PM By: Baruch Gouty RN, BSN Entered By: Baruch Gouty on 06/28/2021 10:32:18 -------------------------------------------------------------------------------- Wound Assessment Details Patient Name: Date of Service: [name] 06/28/2021 10:30 A M Medical Record Number: [id number] Patient Account Number: [id number] Date of Birth/Sex: Treating RN: Jan 19, 1966 (55 y.o. Thomas Gonzalez Primary Care Michaeleen Down: Thomas Gonzalez Other Clinician: Referring Antoni Stefan: Treating Lavine Hargrove/Extender: Thomas Gonzalez in Treatment: 0 Wound Status Wound Number: 1 Primary Etiology: Open Surgical Wound Wound Location: Abdomen -  midline Wound Status: Open Wounding Event: Surgical Injury Comorbid History: Received Chemotherapy, Received Radiation Date Acquired: 09/30/2020 Weeks Of Treatment: 0 Clustered Wound: No Photos Wound Measurements Length: (cm) 9.8 Width: (cm) 6.5 Depth: (cm) 0.1 Area: (cm) 50.03 Volume: (cm) 5.003 % Reduction in Area: 3.9% % Reduction in Volume: 3.9% Epithelialization: Small (1-33%) Tunneling: No Undermining: No Wound Description Classification: Full Thickness Without Exposed Support Structures Wound Margin: Flat and Intact Exudate Amount: Medium Exudate Type: Serosanguineous Exudate Color: red, brown Foul Odor After Cleansing: No Slough/Fibrino No Wound Bed Granulation Amount: Large (67-100%) Exposed Structure Granulation Quality: Red, Hyper-granulation Fascia Exposed: No Necrotic Amount: None Present (0%) Fat Layer (Subcutaneous Tissue) Exposed: Yes Tendon Exposed: No Muscle Exposed: No Joint Exposed: No Bone Exposed: No Treatment Notes Wound #1 (Abdomen - midline) Cleanser Peri-Wound Care Topical Primary Dressing Hydrofera Blue Ready Foam, 4x5 in Discharge Instruction: Apply to wound bed as instructed Secondary Dressing ABD Pad, 8x10 Discharge Instruction: Apply over primary dressing as directed. Secured With 57M Medipore H Soft Cloth Surgical T 4 x 2 (in/yd) ape Discharge Instruction: Secure dressing with tape as directed. Compression Wrap Compression Stockings Add-Ons Electronic  Signature(s) Signed: 06/28/2021 5:39:27 PM By: Baruch Gouty RN, BSN Entered By: Baruch Gouty on 06/28/2021 10:34:26 -------------------------------------------------------------------------------- [place] Details Patient Name: Date of Service: [name] 06/28/2021 10:30 A M Medical Record Number: [id number] Patient Account Number: [id number] Date of Birth/Sex: Treating RN: [birth year] (55 y.o. Thomas Gonzalez Primary Care Rathana Viveros: Thomas Gonzalez Other  Clinician: Referring Kattia Selley: Treating Ellianna Ruest/Extender: Thomas Gonzalez in Treatment: 0 Vital Signs Time Taken: 10:28 Temperature (F): 98.2 Height (in): 69 Pulse (bpm): 94 Source: Stated Respiratory Rate (breaths/min): 18 Weight (lbs): 191 Blood Pressure (mmHg): 141/98 Source: Stated Reference Range: 80 - 120 mg / dl Body Mass Index (BMI): 28.2 Electronic Signature(s) Signed: 06/28/2021 5:39:27 PM By: Baruch Gouty RN, BSN Entered By: Baruch Gouty on 06/28/2021 10:28:40

## 2021-06-28 NOTE — Progress Notes (Addendum)
AURION, RISI ([id number]) Visit Report for 06/28/2021 Chief Complaint Document Details Patient Name: Date of Service: Thomas Gonzalez 06/28/2021 10:30 A M Medical Record Number: [id number] Patient Account Number: [id number] Date of Birth/Sex: Treating RN: 01-20-1966 (55 y.o. [name], [name] Primary Care Provider: [name] Other Clinician: Referring Provider: Treating Provider/Extender: [name] in Treatment: 0 Information Obtained from: Patient Chief Complaint Surgical Ulcer Electronic Signature(s) Signed: 06/28/2021 10:16:11 AM By: Worthy Keeler PA-C Entered By: Worthy Keeler on 06/28/2021 10:16:11 -------------------------------------------------------------------------------- HPI Details Patient Name: Date of Service: Thomas Laurena Bering K. 06/28/2021 10:30 A M Medical Record Number: [id number] Patient Account Number: [id number] Date of Birth/Sex: Treating RN: February 21, 1966 (55 y.o. [name], [name] Primary Care Provider: [name] Other Clinician: Referring Provider: Treating Provider/Extender: [name] in Treatment: 0 History of Present Illness HPI Description: 06/22/2021 patient presents today as a pleasant gentleman who unfortunately has been having quite a bit of an issue with his abdominal area following surgery in 2021 November. 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 Subsequently he also was on immunotherapy following to try to help with any residual carcinoma following the procedure. Nonetheless he had an issue with the incision site in the mid abdomen and unfortunately this ended up dehiscing and has had trouble since getting this healed. He has been seen by Dr. [name] him where ACell was utilized but again there is really no response she referred the patient to [place] for further evaluation and treatment. No chemical cauterization with silver nitrate has  been utilized. He also has not had any he tells me aggressive sharp debridement of the area to clear away some of this hypergranular tissue which he has significant amounts noted. Either way I think that we will get a need to try to see about removing this I do think, to send sample of this to biopsy just to make sure there is no signs of cancer noted although I really think that is probably not can be the case I think we will get a get a report back stating hypergranulation tissue. With that being said the patient does seem to have quite a bit of bleeding and friability even with wiping over the wound area and again I think this is a big part of the issue again he can even grow new skin because anything attaching is attaching to the hypergranulation which is not even get a be anywhere close to sufficient for good epithelial growth. He also has had a CT scan in April which showed there was post esophagectomy with gastric pull-up anatomy but no evidence of local recurrence. There is no evidence of mediastinal nodal metastasis. He did have small bilateral pulmonary nodules which were unchanged recommend routine surveillance. Otherwise the patient has a ventral hernia following the surgery but no evidence of obstruction or gangrene. 06/28/2021 upon evaluation today patient's wound actually appears to be doing quite a bit better and it is hypergranular tissue is significantly improved compared to last time. With that being said they had a hard time getting the [facility] I am not sure exactly what all happened but the good news is that is something that should be on the way and he should have that if not today then tomorrow. Overall I think that with the [facility] this would have looked even better but nonetheless I am still pleased with what I am seeing today. Electronic Signature(s) Signed:  06/29/2021 9:13:06 AM By: Worthy Keeler PA-C Entered By: Worthy Keeler on 06/29/2021  09:13:06 -------------------------------------------------------------------------------- Physical Exam Details Patient Name: Date of Service: Thomas [name] 06/28/2021 10:30 A M Medical Record Number: [id number] Patient Account Number: [id number] Date of Birth/Sex: Treating RN: 1966-01-14 (55 y.o. [name], [name] Primary Care Provider: [name] Other Clinician: Referring Provider: Treating Provider/Extender: [name] in Treatment: 0 Constitutional Well-nourished and well-hydrated in no acute distress. Respiratory normal breathing without difficulty. Psychiatric this patient is able to make decisions and demonstrates good insight into disease process. Alert and Oriented x 3. pleasant and cooperative. Notes I did perform chemical cauterization with silver nitrate to the abdominal wound. That was actually done using 2 sticks of silver nitrate today and he tolerated that without any pain. I think this coupled with the [facility] is probably the best way to go going forward. I do not think any sharp debridement is necessary today. Electronic Signature(s) Signed: 06/29/2021 9:13:31 AM By: Worthy Keeler PA-C Entered By: Worthy Keeler on 06/29/2021 09:13:30 -------------------------------------------------------------------------------- Physician Orders Details Patient Name: Date of Service: Thomas Laurena Bering K. 06/28/2021 10:30 A M Medical Record Number: [id number] Patient Account Number: [id number] Date of Birth/Sex: Treating RN: 06-11-1966 (55 y.o. [name], [name] Primary Care Provider: [name] Other Clinician: Referring Provider: Treating Provider/Extender: [name] in Treatment: 0 Verbal / Phone Orders: No Diagnosis Coding ICD-10 Coding Code Description T81.31XA Disruption of external operation (surgical) wound, not elsewhere classified, initial encounter L98.492 Non-pressure chronic ulcer  of skin of other sites with fat layer exposed Z85.01 Personal history of malignant neoplasm of esophagus K43.9 Ventral hernia without obstruction or gangrene Follow-up Appointments Return Appointment in 1 week. Bathing/ Shower/ Hygiene May shower and wash wound with soap and water. Wound Treatment Wound #1 - Abdomen - midline Prim Dressing: Hydrofera Blue Ready Foam, 4x5 in Every Other Day/Other:90 days ary Discharge Instructions: Apply to wound bed as instructed Secondary Dressing: ABD Pad, 8x10 Every Other Day/Other:90 days Discharge Instructions: Apply over primary dressing as directed. Secured With: 68M Medipore H Soft Cloth Surgical T 4 x 2 (in/yd) Every Other Day/Other:90 days ape Discharge Instructions: Secure dressing with tape as directed. Electronic Signature(s) Signed: 06/28/2021 5:35:30 PM By: Rhae Hammock RN Signed: 06/30/2021 5:47:57 PM By: Worthy Keeler PA-C Entered By: Rhae Hammock on 06/28/2021 11:08:34 -------------------------------------------------------------------------------- Problem List Details Patient Name: Date of Service: Thomas Laurena Bering K. 06/28/2021 10:30 A M Medical Record Number: [id number] Patient Account Number: [id number] Date of Birth/Sex: Treating RN: 12/29/1965 (55 y.o. [name], [name] Primary Care Provider: [name] Other Clinician: Referring Provider: Treating Provider/Extender: [name] in Treatment: 0 Active Problems ICD-10 Encounter Code Description Active Date MDM Diagnosis T81.31XA Disruption of external operation (surgical) wound, not elsewhere classified, 06/22/2021 No Yes initial encounter L98.492 Non-pressure chronic ulcer of skin of other sites with fat layer exposed 06/22/2021 No Yes Z85.01 Personal history of malignant neoplasm of esophagus 06/22/2021 No Yes K43.9 Ventral hernia without obstruction or gangrene 06/22/2021 No Yes Inactive Problems Resolved Problems Electronic  Signature(s) Signed: 06/28/2021 10:16:06 AM By: Worthy Keeler PA-C Entered By: Worthy Keeler on 06/28/2021 10:16:06 -------------------------------------------------------------------------------- Progress Note Details Patient Name: Date of Service: Thomas Laurena Bering K. 06/28/2021 10:30 A M Medical Record Number: [id number] Patient Account Number: [id number] Date of Birth/Sex: Treating RN: Jun 22, 1966 (55 y.o. [name], [name] Primary Care Provider: [name] Other Clinician:  Referring Provider: Treating Provider/Extender: [name] in Treatment: 0 Subjective Chief Complaint Information obtained from Patient Surgical Ulcer History of Present Illness (HPI) 06/22/2021 patient presents today as a pleasant gentleman who unfortunately has been having quite a bit of an issue with his abdominal area following surgery in 2021 November. 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 Subsequently he also was on immunotherapy following to try to help with any residual carcinoma following the procedure. Nonetheless he had an issue with the incision site in the mid abdomen and unfortunately this ended up dehiscing and has had trouble since getting this healed. He has been seen by Dr. [name] him where ACell was utilized but again there is really no response she referred the patient to [place] for further evaluation and treatment. No chemical cauterization with silver nitrate has been utilized. He also has not had any he tells me aggressive sharp debridement of the area to clear away some of this hypergranular tissue which he has significant amounts noted. Either way I think that we will get a need to try to see about removing this I do think, to send sample of this to biopsy just to make sure there is no signs of cancer noted although I really think that is probably not can be the case I think we will get a get a report back stating  hypergranulation tissue. With that being said the patient does seem to have quite a bit of bleeding and friability even with wiping over the wound area and again I think this is a big part of the issue again he can even grow new skin because anything attaching is attaching to the hypergranulation which is not even get a be anywhere close to sufficient for good epithelial growth. He also has had a CT scan in April which showed there was post esophagectomy with gastric pull-up anatomy but no evidence of local recurrence. There is no evidence of mediastinal nodal metastasis. He did have small bilateral pulmonary nodules which were unchanged recommend routine surveillance. Otherwise the patient has a ventral hernia following the surgery but no evidence of obstruction or gangrene. 06/28/2021 upon evaluation today patient's wound actually appears to be doing quite a bit better and it is hypergranular tissue is significantly improved compared to last time. With that being said they had a hard time getting the [facility] I am not sure exactly what all happened but the good news is that is something that should be on the way and he should have that if not today then tomorrow. Overall I think that with the [facility] this would have looked even better but nonetheless I am still pleased with what I am seeing today. Objective Constitutional Well-nourished and well-hydrated in no acute distress. Vitals Time Taken: 10:28 AM, Height: 69 in, Source: Stated, Weight: 191 lbs, Source: Stated, BMI: 28.2, Temperature: 98.2 F, Pulse: 94 bpm, Respiratory Rate: 18 breaths/min, Blood Pressure: 141/98 mmHg. Respiratory normal breathing without difficulty. Psychiatric this patient is able to make decisions and demonstrates good insight into disease process. Alert and Oriented x 3. pleasant and cooperative. General Notes: I did perform chemical cauterization with silver nitrate to the abdominal wound. That was  actually done using 2 sticks of silver nitrate today and he tolerated that without any pain. I think this coupled with the [facility] is probably the best way to go going forward. I do not think any sharp debridement is necessary today. Integumentary (Hair,  Skin) Wound #1 status is Open. Original cause of wound was Surgical Injury. The date acquired was: 09/30/2020. The wound is located on the Abdomen - midline. The wound measures 9.8cm length x 6.5cm width x 0.1cm depth; 50.03cm^2 area and 5.003cm^3 volume. Wound #1 status is Open. Original cause of wound was Surgical Injury. The date acquired was: 09/30/2020. The wound is located on the Abdomen - midline. The wound measures 9.8cm length x 6.5cm width x 0.1cm depth; 50.03cm^2 area and 5.003cm^3 volume. There is Fat Layer (Subcutaneous Tissue) exposed. There is no tunneling or undermining noted. There is a medium amount of serosanguineous drainage noted. The wound margin is flat and intact. There is large (67-100%) red, hyper - granulation within the wound bed. There is no necrotic tissue within the wound bed. Assessment Active Problems ICD-10 Disruption of external operation (surgical) wound, not elsewhere classified, initial encounter Non-pressure chronic ulcer of skin of other sites with fat layer exposed Personal history of malignant neoplasm of esophagus Ventral hernia without obstruction or gangrene Plan Follow-up Appointments: Return Appointment in 1 week. Bathing/ Shower/ Hygiene: May shower and wash wound with soap and water. WOUND #1: - Abdomen - midline Wound Laterality: Prim Dressing: Hydrofera Blue Ready Foam, 4x5 in Every Other Day/Other:90 days ary Discharge Instructions: Apply to wound bed as instructed Secondary Dressing: ABD Pad, 8x10 Every Other Day/Other:90 days Discharge Instructions: Apply over primary dressing as directed. Secured With: 70M Medipore H Soft Cloth Surgical T 4 x 2 (in/yd) Every Other Day/Other:90  days ape Discharge Instructions: Secure dressing with tape as directed. 1. I am can recommend that we going continue with the wound care measures as before and the patient is in agreement with the plan this includes the use of the [facility]. I think that is doing a great job and hopefully will continue to do well for him. We just need to make sure he gets that he should get that within the next day. 2. Would recommend covering with ABD pads and then secure with tape. We will see patient back for reevaluation in 1 week here in the clinic. If anything worsens or changes patient will contact our office for additional recommendations. Electronic Signature(s) Signed: 06/29/2021 9:13:54 AM By: Worthy Keeler PA-C Entered By: Worthy Keeler on 06/29/2021 09:13:54 -------------------------------------------------------------------------------- SuperBill Details Patient Name: Date of Service: Thomas [name]. 06/28/2021 Medical Record Number: [id number] Patient Account Number: [id number] Date of Birth/Sex: Treating RN: [birth year] (55 y.o. [name], [name] Primary Care Provider: [name] Other Clinician: Referring Provider: Treating Provider/Extender: [name] in Treatment: 0 Diagnosis Coding ICD-10 Codes Code Description T81.31XA Disruption of external operation (surgical) wound, not elsewhere classified, initial encounter L98.492 Non-pressure chronic ulcer of skin of other sites with fat layer exposed Z85.01 Personal history of malignant neoplasm of esophagus K43.9 Ventral hernia without obstruction or gangrene Facility Procedures CPT4 Code: [id number] Description: 99213 - WOUND CARE VISIT-LEV 3 EST PT Modifier: Quantity: 1 CPT4 Code: [id number] Description: [id number] - CHEM CAUT GRANULATION TISS ICD-10 Diagnosis Description L98.492 Non-pressure chronic ulcer of skin of other sites with fat layer exposed Modifier: Quantity: 1 Physician  Procedures : CPT4 Code Description Modifier [id number] - WC PHYS CHEM CAUT GRAN TISSUE ICD-10 Diagnosis Description L98.492 Non-pressure chronic ulcer of skin of other sites with fat layer exposed Quantity: 1 Electronic Signature(s) Signed: 06/29/2021 9:16:32 AM By: Worthy Keeler PA-C Previous Signature: 06/28/2021 5:35:30 PM Version By: Rhae Hammock RN Entered By: Worthy Keeler  on 06/29/2021 09:16:32

## 2021-06-28 NOTE — Progress Notes (Signed)
   Referring Provider Lillard Anes, MD [address] [place],  [place] [address]   CC:  Chief Complaint  Patient presents with   Follow-up      Thomas Gonzalez is an 55 y.o. male.  HPI: Patient is a 55 year old male here for follow-up on his abdominal wound that he has had since November 2021.  He developed this wound after incisional dehiscence after abdominal surgery.  He has a history of radiation to this area.  He was last seen in our office on 05/31/2021 by Dr. Marla Roe and was referred to wound care clinic at that time for second opinion.  He presents today with his wife and they feel as if things are going well at the wound care clinic and they would like to continue following at their clinic.  Patient reports he was evaluated at the clinic today and they change his dressing and therefore requests the dressing remain intact on evaluation today.  Physical Exam Vitals with BMI 06/21/2021 05/26/2021 05/24/2021  Height '5\' 9"'$  '5\' 9"'$  '5\' 9"'$   Weight 191 lbs 3 oz 193 lbs 12 oz 190 lbs  BMI 28.22 [id number]  Systolic Q000111Q Q000111Q [id number]  Diastolic 85 82 84  Pulse 93 93 92    General:  No acute distress,  Alert and oriented, Non-Toxic, Normal speech and affect Abdomen: Dressing in place  Assessment/Plan 55 year old male with abdominal wound, previous surgical excision and application of wound matrix with Dr. Marla Roe on 01/27/2021.  He is now following with wound care clinic, recommend continue to follow with the wound care clinic, patient is in agreement with this.  We remain available for any surgical debridement.  Thomas Gonzalez 06/28/2021, 1:34 PM

## 2021-06-29 ENCOUNTER — Inpatient Hospital Stay: Payer: 59

## 2021-06-29 NOTE — Telephone Encounter (Signed)
Received fax from [facility] stating that they have provided service x the pt; no further action is required.

## 2021-06-30 ENCOUNTER — Encounter: Payer: Self-pay | Admitting: Oncology

## 2021-06-30 ENCOUNTER — Telehealth: Payer: Self-pay | Admitting: Oncology

## 2021-06-30 NOTE — Telephone Encounter (Signed)
Per 8/11 Staff Msg, patient scheduled for 8/12 Infusion at 9:30 am

## 2021-07-01 ENCOUNTER — Other Ambulatory Visit: Payer: Self-pay

## 2021-07-01 ENCOUNTER — Inpatient Hospital Stay: Payer: 59

## 2021-07-01 VITALS — BP 116/87 | HR 86 | Temp 98.1°F | Resp 18 | Ht 69.0 in | Wt 192.8 lb

## 2021-07-01 DIAGNOSIS — C160 Malignant neoplasm of cardia: Secondary | ICD-10-CM | POA: Diagnosis not present

## 2021-07-01 DIAGNOSIS — C159 Malignant neoplasm of esophagus, unspecified: Secondary | ICD-10-CM

## 2021-07-01 MED ORDER — SODIUM CHLORIDE 0.9 % IV SOLN
480.0000 mg | Freq: Once | INTRAVENOUS | Status: AC
  Administered 2021-07-01: 480 mg via INTRAVENOUS
  Filled 2021-07-01: qty 48

## 2021-07-01 MED ORDER — HEPARIN SOD (PORK) LOCK FLUSH 100 UNIT/ML IV SOLN
500.0000 [IU] | Freq: Once | INTRAVENOUS | Status: AC | PRN
  Administered 2021-07-01: 500 [IU]
  Filled 2021-07-01: qty 5

## 2021-07-01 MED ORDER — SODIUM CHLORIDE 0.9 % IV SOLN
Freq: Once | INTRAVENOUS | Status: AC
  Filled 2021-07-01: qty 250

## 2021-07-01 MED ORDER — SODIUM CHLORIDE 0.9% FLUSH
10.0000 mL | INTRAVENOUS | Status: DC | PRN
Start: 2021-07-01 — End: 2021-07-01
  Administered 2021-07-01: 10 mL
  Filled 2021-07-01: qty 10

## 2021-07-01 NOTE — Patient Instructions (Signed)
[facility] [place]  Discharge Instructions: Thank you for choosing [facility] to provide your oncology and hematology care.  If you have a lab appointment with the Cancer Center, please go directly to the Cancer Center and check in at the registration area.   Wear comfortable clothing and clothing appropriate for easy access to any Portacath or PICC line.   We strive to give you quality time with your provider. You may need to reschedule your appointment if you arrive late (15 or more minutes).  Arriving late affects you and other patients whose appointments are after yours.  Also, if you miss three or more appointments without notifying the office, you may be dismissed from the clinic at the provider's discretion.      For prescription refill requests, have your pharmacy contact our office and allow 72 hours for refills to be completed.    Today you received the following chemotherapy and/or immunotherapy agents Nivolumab     To help prevent nausea and vomiting after your treatment, we encourage you to take your nausea medication as directed.  BELOW ARE SYMPTOMS THAT SHOULD BE REPORTED IMMEDIATELY: *FEVER GREATER THAN 100.4 F (38 C) OR HIGHER *CHILLS OR SWEATING *NAUSEA AND VOMITING THAT IS NOT CONTROLLED WITH YOUR NAUSEA MEDICATION *UNUSUAL SHORTNESS OF BREATH *UNUSUAL BRUISING OR BLEEDING *URINARY PROBLEMS (pain or burning when urinating, or frequent urination) *BOWEL PROBLEMS (unusual diarrhea, constipation, pain near the anus) TENDERNESS IN MOUTH AND THROAT WITH OR WITHOUT PRESENCE OF ULCERS (sore throat, sores in mouth, or a toothache) UNUSUAL RASH, SWELLING OR PAIN  UNUSUAL VAGINAL DISCHARGE OR ITCHING   Items with * indicate a potential emergency and should be followed up as soon as possible or go to the Emergency Department if any problems should occur.  Please show the CHEMOTHERAPY ALERT CARD or IMMUNOTHERAPY ALERT CARD at check-in to the  Emergency Department and triage nurse.  Should you have questions after your visit or need to cancel or reschedule your appointment, please contact [facility] [place]  Dept: [phone]  and follow the prompts.  Office hours are 8:00 a.m. to 4:30 p.m. Monday - Friday. Please note that voicemails left after 4:00 p.m. may not be returned until the following business day.  We are closed weekends and major holidays. You have access to a nurse at all times for urgent questions. Please call the main number to the clinic Dept: [phone] and follow the prompts.  For any non-urgent questions, you may also contact your provider using MyChart. We now offer e-Visits for anyone 18 and older to request care online for non-urgent symptoms. For details visit [email or web address].   Also download the MyChart app! Go to the app store, search "MyChart", open the app, select [facility], and log in with your MyChart username and password.  Due to Covid, a mask is required upon entering the hospital/clinic. If you do not have a mask, one will be given to you upon arrival. For doctor visits, patients may have 1 support person aged 18 or older with them. For treatment visits, patients cannot have anyone with them due to current Covid guidelines and our immunocompromised population.    

## 2021-07-06 ENCOUNTER — Encounter (HOSPITAL_BASED_OUTPATIENT_CLINIC_OR_DEPARTMENT_OTHER): Payer: 59 | Admitting: Physician Assistant

## 2021-07-06 ENCOUNTER — Other Ambulatory Visit: Payer: Self-pay

## 2021-07-06 DIAGNOSIS — T8131XA Disruption of external operation (surgical) wound, not elsewhere classified, initial encounter: Secondary | ICD-10-CM | POA: Diagnosis not present

## 2021-07-06 NOTE — Progress Notes (Signed)
ONOFRE, ST[place]IL[place] ([id number]) Visit Report for 07/06/2021 Arrival Information Details Patient Name: Date of Service: [name] 07/06/2021 1:15 PM Medical Record Number: [id number] Patient Account Number: [id number] Date of Birth/Sex: Treating RN: 04-Aug-1966 (55 y.o. [name], [name] Primary Care Laporsche Hoeger: [name] Other Clinician: Referring Perri Lamagna: Treating Dorean Hiebert/Extender: [name] in Treatment: 2 Visit Information History Since Last Visit Added or deleted any medications: No Patient Arrived: Ambulatory Any new allergies or adverse reactions: No Arrival Time: 13:43 Had a fall or experienced change in No Accompanied By: self activities of daily living that may affect Transfer Assistance: None risk of falls: Patient Identification Verified: Yes Signs or symptoms of abuse/neglect since last visito No Secondary Verification Process Completed: Yes Hospitalized since last visit: No Patient Requires Transmission-Based Precautions: No Implantable device outside of the clinic excluding No Patient Has Alerts: No cellular tissue based products placed in the center since last visit: Has Dressing in Place as Prescribed: Yes Pain Present Now: No Electronic Signature(s) Signed: 07/06/2021 5:20:51 PM By: Rhae Hammock RN Entered By: Rhae Hammock on 07/06/2021 13:43:57 -------------------------------------------------------------------------------- Clinic Level of Care Assessment Details Patient Name: Date of Service: [name] 07/06/2021 1:15 PM Medical Record Number: [id number] Patient Account Number: [id number] Date of Birth/Sex: Treating RN: 11-27-1965 (55 y.o. [name], [name] Primary Care Dominigue Gellner: [name] Other Clinician: Referring Alila Sotero: Treating Percy Comp/Extender: [name] in Treatment: 2 Clinic Level of Care Assessment Items TOOL 4 Quantity Score X- 1 0 Use  when only an EandM is performed on FOLLOW-UP visit ASSESSMENTS - Nursing Assessment / Reassessment X- 1 10 Reassessment of Co-morbidities (includes updates in patient status) X- 1 5 Reassessment of Adherence to Treatment Plan ASSESSMENTS - Wound and Skin A ssessment / Reassessment X - Simple Wound Assessment / Reassessment - one wound 1 5 '[]'$  - 0 Complex Wound Assessment / Reassessment - multiple wounds '[]'$  - 0 Dermatologic / Skin Assessment (not related to wound area) ASSESSMENTS - Focused Assessment '[]'$  - 0 Circumferential Edema Measurements - multi extremities '[]'$  - 0 Nutritional Assessment / Counseling / Intervention '[]'$  - 0 Lower Extremity Assessment (monofilament, tuning fork, pulses) '[]'$  - 0 Peripheral Arterial Disease Assessment (using hand held doppler) ASSESSMENTS - Ostomy and/or Continence Assessment and Care '[]'$  - 0 Incontinence Assessment and Management '[]'$  - 0 Ostomy Care Assessment and Management (repouching, etc.) PROCESS - Coordination of Care X - Simple Patient / Family Education for ongoing care 1 15 '[]'$  - 0 Complex (extensive) Patient / Family Education for ongoing care X- 1 10 Staff obtains Programmer, systems, Records, T Results / Process Orders est '[]'$  - 0 Staff telephones HHA, Nursing Homes / Clarify orders / etc '[]'$  - 0 Routine Transfer to another Facility (non-emergent condition) '[]'$  - 0 Routine Hospital Admission (non-emergent condition) '[]'$  - 0 New Admissions / Biomedical engineer / Ordering NPWT Apligraf, etc. , '[]'$  - 0 Emergency Hospital Admission (emergent condition) X- 1 10 Simple Discharge Coordination '[]'$  - 0 Complex (extensive) Discharge Coordination PROCESS - Special Needs '[]'$  - 0 Pediatric / Minor Patient Management '[]'$  - 0 Isolation Patient Management '[]'$  - 0 Hearing / Language / Visual special needs '[]'$  - 0 Assessment of Community assistance (transportation, D/C planning, etc.) '[]'$  - 0 Additional assistance / Altered mentation '[]'$  - 0 Support  Surface(s) Assessment (bed, cushion, seat, etc.) INTERVENTIONS - Wound Cleansing / Measurement X - Simple Wound Cleansing - one wound 1 5 '[]'$  - 0 Complex Wound Cleansing -  multiple wounds X- 1 5 Wound Imaging (photographs - any number of wounds) '[]'$  - 0 Wound Tracing (instead of photographs) X- 1 5 Simple Wound Measurement - one wound '[]'$  - 0 Complex Wound Measurement - multiple wounds INTERVENTIONS - Wound Dressings X - Small Wound Dressing one or multiple wounds 1 10 '[]'$  - 0 Medium Wound Dressing one or multiple wounds '[]'$  - 0 Large Wound Dressing one or multiple wounds '[]'$  - 0 Application of Medications - topical '[]'$  - 0 Application of Medications - injection INTERVENTIONS - Miscellaneous '[]'$  - 0 External ear exam '[]'$  - 0 Specimen Collection (cultures, biopsies, blood, body fluids, etc.) '[]'$  - 0 Specimen(s) / Culture(s) sent or taken to Lab for analysis '[]'$  - 0 Patient Transfer (multiple staff / Civil Service fast streamer / Similar devices) '[]'$  - 0 Simple Staple / Suture removal (25 or less) '[]'$  - 0 Complex Staple / Suture removal (26 or more) '[]'$  - 0 Hypo / Hyperglycemic Management (close monitor of Blood Glucose) '[]'$  - 0 Ankle / Brachial Index (ABI) - do not check if billed separately X- 1 5 Vital Signs Has the patient been seen at the hospital within the last three years: Yes Total Score: 85 Level Of Care: New/Established - Level 3 Electronic Signature(s) Signed: 07/06/2021 5:20:51 PM By: Rhae Hammock RN Entered By: Rhae Hammock on 07/06/2021 13:54:18 -------------------------------------------------------------------------------- Lower Extremity Assessment Details Patient Name: Date of Service: [name] 07/06/2021 1:15 PM Medical Record Number: [id number] Patient Account Number: [id number] Date of Birth/Sex: Treating RN: 08-31-1966 (55 y.o. [name], [name] Primary Care Zykiria Bruening: [name] Other Clinician: Referring Olney Monier: Treating Shonnie Poudrier/Extender:  [name] in Treatment: 2 Electronic Signature(s) Signed: 07/06/2021 5:20:51 PM By: Rhae Hammock RN Entered By: Rhae Hammock on 07/06/2021 13:45:05 -------------------------------------------------------------------------------- Kief Details Patient Name: Date of Service: [name]. 07/06/2021 1:15 PM Medical Record Number: [id number] Patient Account Number: [id number] Date of Birth/Sex: Treating RN: [birth year] (55 y.o. [name], [name] Primary Care Roark Rufo: [name] Other Clinician: Referring Makell Drohan: Treating Michel Eskelson/Extender: [name] in Treatment: 2 [place] reviewed with physician Active Inactive Wound/Skin Impairment Nursing Diagnoses: Impaired tissue integrity Knowledge deficit related to ulceration/compromised skin integrity Goals: Patient/caregiver will verbalize understanding of skin care regimen Date Initiated: 06/22/2021 Target Resolution Date: 07/20/2021 Goal Status: Active Ulcer/skin breakdown will have a volume reduction of 30% by week 4 Date Initiated: 06/22/2021 Target Resolution Date: 07/20/2021 Goal Status: Active Interventions: Assess patient/caregiver ability to obtain necessary supplies Assess patient/caregiver ability to perform ulcer/skin care regimen upon admission and as needed Assess ulceration(s) every visit Provide education on ulcer and skin care Treatment Activities: Skin care regimen initiated : 06/22/2021 Topical wound management initiated : 06/22/2021 Notes: Electronic Signature(s) Signed: 07/06/2021 5:20:51 PM By: Rhae Hammock RN Entered By: Rhae Hammock on 07/06/2021 13:51:41 -------------------------------------------------------------------------------- Pain Assessment Details Patient Name: Date of Service: [name] 07/06/2021 1:15 PM Medical Record Number: [id number] Patient Account Number:  [id number] Date of Birth/Sex: Treating RN: July 24, 1966 (55 y.o. [name], [name] Primary Care Hayde Kilgour: [name] Other Clinician: Referring Kalup Jaquith: Treating Deairra Halleck/Extender: [name] in Treatment: 2 Active Problems Location of Pain Severity and Description of Pain Patient Has Paino No Site Locations Pain Management and Medication Current Pain Management: Electronic Signature(s) Signed: 07/06/2021 5:20:51 PM By: Rhae Hammock RN Entered By: Rhae Hammock on 07/06/2021 13:44:51 -------------------------------------------------------------------------------- Patient/Caregiver Education Details Patient Name: Date of Service: [place] Thomas Bering  K. 8/17/2022andnbsp1:15 PM Medical Record Number: [id number] Patient Account Number: [id number] Date of Birth/Gender: Treating RN: 09-09-1966 (55 y.o. Erie Noe Primary Care Physician: [name] Other Clinician: Referring Physician: Treating Physician/Extender: [name] in Treatment: 2 Education Assessment Education Provided To: Patient Education Topics Provided Wound/Skin Impairment: Methods: Explain/Verbal Responses: State content correctly Electronic Signature(s) Signed: 07/06/2021 5:20:51 PM By: Rhae Hammock RN Entered By: Rhae Hammock on 07/06/2021 13:52:25 -------------------------------------------------------------------------------- Wound Assessment Details Patient Name: Date of Service: [name] 07/06/2021 1:15 PM Medical Record Number: [id number] Patient Account Number: [id number] Date of Birth/Sex: Treating RN: 11/14/1966 (55 y.o. [name], [name] Primary Care Archit Leger: [name] Other Clinician: Referring Deverick Pruss: Treating Lejon Afzal/Extender: [name] in Treatment: 2 Wound Status Wound Number: 1 Primary Etiology: Open Surgical Wound Wound Location: Abdomen -  midline Wound Status: Open Wounding Event: Surgical Injury Comorbid History: Received Chemotherapy, Received Radiation Date Acquired: 09/30/2020 Weeks Of Treatment: 2 Clustered Wound: No Wound Measurements Length: (cm) 8.2 Width: (cm) 6.2 Depth: (cm) 0.1 Area: (cm) 39.93 Volume: (cm) 3.993 % Reduction in Area: 23.3% % Reduction in Volume: 23.3% Epithelialization: Small (1-33%) Tunneling: No Undermining: No Wound Description Classification: Full Thickness Without Exposed Support Structures Wound Margin: Flat and Intact Exudate Amount: Medium Exudate Type: Serosanguineous Exudate Color: red, brown Foul Odor After Cleansing: No Slough/Fibrino No Wound Bed Granulation Amount: Large (67-100%) Exposed Structure Granulation Quality: Red, Hyper-granulation Fascia Exposed: No Necrotic Amount: None Present (0%) Fat Layer (Subcutaneous Tissue) Exposed: Yes Tendon Exposed: No Muscle Exposed: No Joint Exposed: No Bone Exposed: No Electronic Signature(s) Signed: 07/06/2021 5:20:51 PM By: Rhae Hammock RN Entered By: Rhae Hammock on 07/06/2021 13:48:03 -------------------------------------------------------------------------------- Vitals Details Patient Name: Date of Service: [name] 07/06/2021 1:15 PM Medical Record Number: [id number] Patient Account Number: [id number] Date of Birth/Sex: Treating RN: [birth year] (55 y.o. [name], [name] Primary Care Thressa Shiffer: [name] Other Clinician: Referring Brennin Durfee: Treating Alazar Cherian/Extender: [name] in Treatment: 2 Vital Signs Time Taken: 13:43 Temperature (F): 98.5 Height (in): 69 Pulse (bpm): 96 Weight (lbs): 191 Respiratory Rate (breaths/min): 17 Body Mass Index (BMI): 28.2 Blood Pressure (mmHg): 115/74 Reference Range: 80 - 120 mg / dl Electronic Signature(s) Signed: 07/06/2021 5:20:51 PM By: Rhae Hammock RN Entered By: Rhae Hammock on 07/06/2021  13:44:13

## 2021-07-06 NOTE — Progress Notes (Addendum)
Thomas Gonzalez, Thomas Gonzalez ([id number]) Visit Report for 07/06/2021 Chief Complaint Document Details Patient Name: Date of Service: [name] 07/06/2021 1:15 PM Medical Record Number: [id number] Patient Account Number: [id number] Date of Birth/Sex: Treating RN: Nov 30, 1965 (55 y.o. M) Primary Care Provider: [name] Other Clinician: Referring Provider: Treating Provider/Extender: [name] in Treatment: 2 Information Obtained from: Patient Chief Complaint Surgical Ulcer Electronic Signature(s) Signed: 07/06/2021 1:49:59 PM By: Worthy Keeler PA-C Entered By: Worthy Keeler on 07/06/2021 13:49:59 -------------------------------------------------------------------------------- HPI Details Patient Name: Date of Service: [name]. 07/06/2021 1:15 PM Medical Record Number: [id number] Patient Account Number: [id number] Date of Birth/Sex: Treating RN: [birth year] (55 y.o. M) Primary Care Provider: [name] Other Clinician: Referring Provider: Treating Provider/Extender: [name] in Treatment: 2 History of Present Illness HPI Description: 06/22/2021 patient presents today as a pleasant gentleman who unfortunately has been having quite a bit of an issue with his abdominal area following surgery in 2021 November. 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 Subsequently he also was on immunotherapy following to try to help with any residual carcinoma following the procedure. Nonetheless he had an issue with the incision site in the mid abdomen and unfortunately this ended up dehiscing and has had trouble since getting this healed. He has been seen by Dr. [name] him where ACell was utilized but again there is really no response she referred the patient to [place] for further evaluation and treatment. No chemical cauterization with silver nitrate has been utilized. He also has not had any he  tells me aggressive sharp debridement of the area to clear away some of this hypergranular tissue which he has significant amounts noted. Either way I think that we will get a need to try to see about removing this I do think, to send sample of this to biopsy just to make sure there is no signs of cancer noted although I really think that is probably not can be the case I think we will get a get a report back stating hypergranulation tissue. With that being said the patient does seem to have quite a bit of bleeding and friability even with wiping over the wound area and again I think this is a big part of the issue again he can even grow new skin because anything attaching is attaching to the hypergranulation which is not even get a be anywhere close to sufficient for good epithelial growth. He also has had a CT scan in April which showed there was post esophagectomy with gastric pull-up anatomy but no evidence of local recurrence. There is no evidence of mediastinal nodal metastasis. He did have small bilateral pulmonary nodules which were unchanged recommend routine surveillance. Otherwise the patient has a ventral hernia following the surgery but no evidence of obstruction or gangrene. 06/28/2021 upon evaluation today patient's wound actually appears to be doing quite a bit better and it is hypergranular tissue is significantly improved compared to last time. With that being said they had a hard time getting the [facility] I am not sure exactly what all happened but the good news is that is something that should be on the way and he should have that if not today then tomorrow. Overall I think that with the [facility] this would have looked even better but nonetheless I am still pleased with what I am seeing today. 07/06/2021 upon evaluation today patient's wound is actually showing  signs of improvement and in general I feel like he is making excellent progress. The Hydrofera Blue dressing to be  doing excellent for him it does cause a little bit of bleeding when he removes it during dressing changes but other than that he is doing quite well based on what I am seeing. Fortunately there does not appear to be any signs of active infection at this time. No fevers, chills, nausea, vomiting, or diarrhea. Electronic Signature(s) Signed: 07/06/2021 1:55:25 PM By: Worthy Keeler PA-C Entered By: Worthy Keeler on 07/06/2021 13:55:25 -------------------------------------------------------------------------------- Chemical Cauterization Details Patient Name: Date of Service: [name]. 07/06/2021 1:15 PM Medical Record Number: [id number] Patient Account Number: [id number] Date of Birth/Sex: Treating RN: 08/18/1966 (55 y.o. M) Primary Care Provider: [name] Other Clinician: Referring Provider: Treating Provider/Extender: [name] in Treatment: 2 Procedure Performed for: Wound #1 Abdomen - midline Performed By: Physician Worthy Keeler, PA Post Procedure Diagnosis Same as Pre-procedure Notes 2 sticks of silver nitrate were used in the procedure Electronic Signature(s) Signed: 07/26/2021 5:01:48 PM By: Worthy Keeler PA-C Entered By: Worthy Keeler on 07/26/2021 17:01:48 -------------------------------------------------------------------------------- Physical Exam Details Patient Name: Date of Service: [name] 07/06/2021 1:15 PM Medical Record Number: [id number] Patient Account Number: [id number] Date of Birth/Sex: Treating RN: December 28, 1965 (55 y.o. M) Primary Care Provider: [name] Other Clinician: Referring Provider: Treating Provider/Extender: [name] in Treatment: 2 Constitutional Well-nourished and well-hydrated in no acute distress. Respiratory normal breathing without difficulty. Psychiatric this patient is able to make decisions and demonstrates good insight into disease  process. Alert and Oriented x 3. pleasant and cooperative. Notes Upon inspection patient's wound bed showed signs of good granulation epithelization at this point. Fortunately there does not appear to be any signs of active infection which is great and overall I am extremely pleased with where things stand today. No fevers, chills, nausea, vomiting, or diarrhea. Electronic Signature(s) Signed: 07/06/2021 1:55:41 PM By: Worthy Keeler PA-C Entered By: Worthy Keeler on 07/06/2021 13:55:40 -------------------------------------------------------------------------------- Physician Orders Details Patient Name: Date of Service: [name]. 07/06/2021 1:15 PM Medical Record Number: [id number] Patient Account Number: [id number] Date of Birth/Sex: Treating RN: [birth year] (55 y.o. Thomas Gonzalez, Thomas Gonzalez Primary Care Provider: [name] Other Clinician: Referring Provider: Treating Provider/Extender: [name] in Treatment: 2 Verbal / Phone Orders: No Diagnosis Coding ICD-10 Coding Code Description T81.31XA Disruption of external operation (surgical) wound, not elsewhere classified, initial encounter L98.492 Non-pressure chronic ulcer of skin of other sites with fat layer exposed Z85.01 Personal history of malignant neoplasm of esophagus K43.9 Ventral hernia without obstruction or gangrene Follow-up Appointments Return Appointment in 1 week. Bathing/ Shower/ Hygiene May shower and wash wound with soap and water. Wound Treatment Wound #1 - Abdomen - midline Prim Dressing: Hydrofera Blue Ready Foam, 4x5 in Every Other Day/Other:90 days ary Discharge Instructions: Apply to wound bed as instructed Secondary Dressing: ABD Pad, 8x10 Every Other Day/Other:90 days Discharge Instructions: Apply over primary dressing as directed. Secured With: 52M Medipore H Soft Cloth Surgical T 4 x 2 (in/yd) Every Other Day/Other:90 days ape Discharge Instructions: Secure  dressing with tape as directed. Electronic Signature(s) Signed: 07/06/2021 4:56:53 PM By: Worthy Keeler PA-C Signed: 07/06/2021 5:20:51 PM By: Rhae Hammock RN Entered By: Rhae Hammock on 07/06/2021 13:53:54 -------------------------------------------------------------------------------- Problem List Details Patient Name: Date of Service: MA Thomas Hey RY K. 07/06/2021 1:15  PM Medical Record Number: [id number] Patient Account Number: [id number] Date of Birth/Sex: Treating RN: March 09, 1966 (55 y.o. M) Primary Care Provider: [name] Other Clinician: Referring Provider: Treating Provider/Extender: [name] in Treatment: 2 Active Problems ICD-10 Encounter Code Description Active Date MDM Diagnosis T81.31XA Disruption of external operation (surgical) wound, not elsewhere classified, 06/22/2021 No Yes initial encounter L98.492 Non-pressure chronic ulcer of skin of other sites with fat layer exposed 06/22/2021 No Yes Z85.01 Personal history of malignant neoplasm of esophagus 06/22/2021 No Yes K43.9 Ventral hernia without obstruction or gangrene 06/22/2021 No Yes Inactive Problems Resolved Problems Electronic Signature(s) Signed: 07/06/2021 1:49:47 PM By: Worthy Keeler PA-C Entered By: Worthy Keeler on 07/06/2021 13:49:46 -------------------------------------------------------------------------------- Progress Note Details Patient Name: Date of Service: [name] 07/06/2021 1:15 PM Medical Record Number: [id number] Patient Account Number: [id number] Date of Birth/Sex: Treating RN: 05-09-1966 (55 y.o. M) Primary Care Provider: [name] Other Clinician: Referring Provider: Treating Provider/Extender: [name] in Treatment: 2 Subjective Chief Complaint Information obtained from Patient Surgical Ulcer History of Present Illness (HPI) 06/22/2021 patient presents today as a pleasant gentleman who  unfortunately has been having quite a bit of an issue with his abdominal area following surgery in 2021 November. 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 Subsequently he also was on immunotherapy following to try to help with any residual carcinoma following the procedure. Nonetheless he had an issue with the incision site in the mid abdomen and unfortunately this ended up dehiscing and has had trouble since getting this healed. He has been seen by Dr. [name] him where ACell was utilized but again there is really no response she referred the patient to [place] for further evaluation and treatment. No chemical cauterization with silver nitrate has been utilized. He also has not had any he tells me aggressive sharp debridement of the area to clear away some of this hypergranular tissue which he has significant amounts noted. Either way I think that we will get a need to try to see about removing this I do think, to send sample of this to biopsy just to make sure there is no signs of cancer noted although I really think that is probably not can be the case I think we will get a get a report back stating hypergranulation tissue. With that being said the patient does seem to have quite a bit of bleeding and friability even with wiping over the wound area and again I think this is a big part of the issue again he can even grow new skin because anything attaching is attaching to the hypergranulation which is not even get a be anywhere close to sufficient for good epithelial growth. He also has had a CT scan in April which showed there was post esophagectomy with gastric pull-up anatomy but no evidence of local recurrence. There is no evidence of mediastinal nodal metastasis. He did have small bilateral pulmonary nodules which were unchanged recommend routine surveillance. Otherwise the patient has a ventral hernia following the surgery but no evidence of obstruction or  gangrene. 06/28/2021 upon evaluation today patient's wound actually appears to be doing quite a bit better and it is hypergranular tissue is significantly improved compared to last time. With that being said they had a hard time getting the [facility] I am not sure exactly what all happened but the good news is that is something that should be on the way and  he should have that if not today then tomorrow. Overall I think that with the [facility] this would have looked even better but nonetheless I am still pleased with what I am seeing today. 07/06/2021 upon evaluation today patient's wound is actually showing signs of improvement and in general I feel like he is making excellent progress. The Hydrofera Blue dressing to be doing excellent for him it does cause a little bit of bleeding when he removes it during dressing changes but other than that he is doing quite well based on what I am seeing. Fortunately there does not appear to be any signs of active infection at this time. No fevers, chills, nausea, vomiting, or diarrhea. Objective Constitutional Well-nourished and well-hydrated in no acute distress. Vitals Time Taken: 1:43 PM, Height: 69 in, Weight: 191 lbs, BMI: 28.2, Temperature: 98.5 F, Pulse: 96 bpm, Respiratory Rate: 17 breaths/min, Blood Pressure: 115/74 mmHg. Respiratory normal breathing without difficulty. Psychiatric this patient is able to make decisions and demonstrates good insight into disease process. Alert and Oriented x 3. pleasant and cooperative. General Notes: Upon inspection patient's wound bed showed signs of good granulation epithelization at this point. Fortunately there does not appear to be any signs of active infection which is great and overall I am extremely pleased with where things stand today. No fevers, chills, nausea, vomiting, or diarrhea. Integumentary (Hair, Skin) Wound #1 status is Open. Original cause of wound was Surgical Injury. The date  acquired was: 09/30/2020. The wound has been in treatment 2 weeks. The wound is located on the Abdomen - midline. The wound measures 8.2cm length x 6.2cm width x 0.1cm depth; 39.93cm^2 area and 3.993cm^3 volume. There is Fat Layer (Subcutaneous Tissue) exposed. There is no tunneling or undermining noted. There is a medium amount of serosanguineous drainage noted. The wound margin is flat and intact. There is large (67-100%) red, hyper - granulation within the wound bed. There is no necrotic tissue within the wound bed. Assessment Active Problems ICD-10 Disruption of external operation (surgical) wound, not elsewhere classified, initial encounter Non-pressure chronic ulcer of skin of other sites with fat layer exposed Personal history of malignant neoplasm of esophagus Ventral hernia without obstruction or gangrene Procedures Wound #1 Pre-procedure diagnosis of Wound #1 is an Open Surgical Wound located on the Abdomen - midline . An Chemical Cauterization procedure was performed by Worthy Keeler, PA. Post procedure Diagnosis Wound #1: Same as Pre-Procedure Notes: 2 sticks of silver nitrate were used in the procedure Plan Follow-up Appointments: Return Appointment in 1 week. Bathing/ Shower/ Hygiene: May shower and wash wound with soap and water. WOUND #1: - Abdomen - midline Wound Laterality: Prim Dressing: Hydrofera Blue Ready Foam, 4x5 in Every Other Day/Other:90 days ary Discharge Instructions: Apply to wound bed as instructed Secondary Dressing: ABD Pad, 8x10 Every Other Day/Other:90 days Discharge Instructions: Apply over primary dressing as directed. Secured With: 80M Medipore H Soft Cloth Surgical T 4 x 2 (in/yd) Every Other Day/Other:90 days ape Discharge Instructions: Secure dressing with tape as directed. 1. I am good recommend that we going continue with the wound care measures as before and the patient is in agreement with the plan. This includes the use of the [facility] which is doing excellent. I did treat this with 2 sticks of silver nitrate today to clear away some of the hypergranulation. 2. I did also review his biopsy results which showed inflamed granulation tissue and hypergranulation but no evidence of abnormalities such as anything cancerous  which is also great news. 3. I am also going to recommend that we have the patient continue to clean this when he changes the dressings I think he is fine to shower and that can sometimes be of benefit as well. Overall however I feel like that this is movement in the right direction and seems to be doing quite well. We will see patient back for reevaluation in 1 week here in the clinic. If anything worsens or changes patient will contact our office for additional recommendations. Electronic Signature(s) Signed: 07/26/2021 5:01:59 PM By: Worthy Keeler PA-C Previous Signature: 07/06/2021 1:58:10 PM Version By: Worthy Keeler PA-C Entered By: Worthy Keeler on 07/26/2021 17:01:59 -------------------------------------------------------------------------------- SuperBill Details Patient Name: Date of Service: [name]. 07/06/2021 Medical Record Number: [id number] Patient Account Number: [id number] Date of Birth/Sex: Treating RN: July 08, 1966 (55 y.o. Thomas Gonzalez, Thomas Gonzalez Primary Care Provider: [name] Other Clinician: Referring Provider: Treating Provider/Extender: [name] in Treatment: 2 Diagnosis Coding ICD-10 Codes Code Description T81.31XA Disruption of external operation (surgical) wound, not elsewhere classified, initial encounter L98.492 Non-pressure chronic ulcer of skin of other sites with fat layer exposed Z85.01 Personal history of malignant neoplasm of esophagus K43.9 Ventral hernia without obstruction or gangrene Facility Procedures CPT4 Code: [id number] Description: [id number] - WOUND CARE VISIT-LEV 3 EST PT Modifier: Quantity: 1 CPT4 Code:  [id number] Description: [id number] - CHEM CAUT GRANULATION TISS ICD-10 Diagnosis Description L98.492 Non-pressure chronic ulcer of skin of other sites with fat layer exposed Modifier: Quantity: 1 Physician Procedures : CPT4 Code Description Modifier [id number] - WC PHYS LEVEL 4 - EST PT 25 ICD-10 Diagnosis Description T81.31XA Disruption of external operation (surgical) wound, not elsewhere classified, initial encounter L98.492 Non-pressure chronic ulcer of skin  of other sites with fat layer exposed Z85.01 Personal history of malignant neoplasm of esophagus K43.9 Ventral hernia without obstruction or gangrene Quantity: 1 : [id number] - WC PHYS CHEM CAUT GRAN TISSUE ICD-10 Diagnosis Description L98.492 Non-pressure chronic ulcer of skin of other sites with fat layer exposed Quantity: 1 Electronic Signature(s) Signed: 07/06/2021 1:58:37 PM By: Worthy Keeler PA-C Entered By: Worthy Keeler on 07/06/2021 13:58:36

## 2021-07-11 NOTE — Progress Notes (Signed)
[facility]  [address] [place],  [place]  [id number] [phone]  Clinic Day:  07/19/2021  Referring physician: Lillard Anes,*  This document serves as a record of services personally performed by [name], MD. It was created on their behalf by [facility], a trained medical scribe. The creation of this record is based on the scribe's personal observations and the provider's statements to them.  HISTORY OF PRESENT ILLNESS:  The patient is a 55 y.o. 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  He comes in today to review CT imaging results to ascertain his new disease baseline following 9 cycles of maintenance nivolumab.  Since his last visit, the patient has been doing well.  He continues to tolerate his nivolumab immunotherapy very well without having any significant side effects.  He denies having any dysphagia, abdominal discomfort or other GI symptoms which concern him for early disease recurrence.     With respect to his esophageal cancer treatment history, he completed neoadjuvant chemoradiation, which consisted of 3 cycles of FOLFOX chemotherapy.  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  Pathology from this surgery showed persistent adenocarcinoma which extended into the muscularis propria level of his stomach.  Based upon this, he is undergoing adjuvant nivolumab immunotherapy for 1 year.   PHYSICAL EXAM:  Blood pressure (!) 134/91, pulse 98, temperature 98.4 F (36.9 C), resp. rate 16, height '5\' 9"'$  (1.753 m), weight 193 lb 1.6 oz (87.6 kg), SpO2 96 %. Wt Readings from Last 3 Encounters:  07/19/21 193 lb 1.6 oz (87.6 kg)  07/01/21 192 lb 12 oz (87.4 kg)  06/21/21 191 lb 3.2 oz (86.7 kg)   Body mass index is 28.52 kg/m. Performance status (ECOG): 1 - Symptomatic but completely ambulatory Physical Exam Constitutional:      General: He is not in acute  distress.    Appearance: Normal appearance. He is normal weight.  HENT:     Head: Normocephalic and atraumatic.  Eyes:     General: No scleral icterus.    Extraocular Movements: Extraocular movements intact.     Conjunctiva/sclera: Conjunctivae normal.     Pupils: Pupils are equal, round, and reactive to light.  Cardiovascular:     Rate and Rhythm: Normal rate and regular rhythm.     Pulses: Normal pulses.     Heart sounds: Normal heart sounds. No murmur heard.   No friction rub. No gallop.  Pulmonary:     Effort: Pulmonary effort is normal. No respiratory distress.     Breath sounds: Normal breath sounds.  Abdominal:     General: Bowel sounds are normal. There is no distension.     Palpations: Abdomen is soft. There is no hepatomegaly, splenomegaly or mass.     Tenderness: There is no abdominal tenderness.  Musculoskeletal:        General: Normal range of motion.     Cervical back: Normal range of motion and neck supple.     Right lower leg: No edema.     Left lower leg: No edema.  Lymphadenopathy:     Cervical: No cervical adenopathy.  Skin:    General: Skin is warm and dry.  Neurological:     General: No focal deficit present.     Mental Status: He is alert and oriented to person, place, and time. Mental status is at baseline.  Psychiatric:        Mood  and Affect: Mood normal.        Behavior: Behavior normal.        Thought Content: Thought content normal.        Judgment: Judgment normal.   SCANS:  CT scans of his chest, abdomen and pelvis with contrast revealed the following:  FINDINGS: CT CHEST FINDINGS  Cardiovascular: Port in the anterior chest wall with tip in distal SVC. No significant vascular findings. Normal heart size. No pericardial effusion.  Mediastinum/Nodes: Gastric pull up anatomy. Normal anastomosis at thoracic inlet. No adenopathy in the mediastinum.  Lungs/Pleura: Cerebral smudgy nodules are unchanged (image 21 in the RIGHT upper lobe in  image 48 in the RIGHT lower lobe). No new pulmonary nodules.  Musculoskeletal: No aggressive osseous lesion.  CT ABDOMEN AND PELVIS FINDINGS  Hepatobiliary: No focal hepatic lesion. No biliary ductal dilatation. Gallbladder is normal. Common bile duct is normal.  Pancreas: Pancreas is normal. No ductal dilatation. No pancreatic inflammation.  Spleen: Normal spleen  Adrenals/urinary tract: Adrenal glands and kidneys are normal. The ureters and bladder normal.  Stomach/Bowel: Gastric anatomy.. Stomach, duodenum small-bowel normal. Terminal ileum normal. Colon rectosigmoid colon normal.  Vascular/Lymphatic: The liver is normal in caliber. No retroperitoneal adenopathy. Periportal adenopathy.  No pelvic lymphadenopathy.  Or inguinal adenopathy.  Reproductive: Prostate unremarkable  Other: No peritoneal metastasis.  Ventral hernia repair.  Musculoskeletal: No aggressive osseous lesion.  IMPRESSION: Chest Impression:  1. Gastric pull-up anatomy without evidence local recurrence. 2. No mediastinal lymphadenopathy. 3. No evidence of pulmonary metastasis. Several small pulmonary nodules are stable.  Abdomen / Pelvis Impression:  1. No abdominal lymphadenopathy.  No liver metastasis. 2. No evidence of metastatic disease in the pelvis. 3. No skeletal metastasis.   LABS:    Ref. Range 05/24/2021 00:00 07/14/2021 0:00  Sodium Latest Ref Range: 137 - 147  138 137  Potassium Latest Ref Range: 3.4 - 5.3  4.6 4.5  Chloride Latest Ref Range: 99 - 108  106 101  CO2 Latest Ref Range: 13 - 22  24 (A) 28  Glucose Unknown 109 106  BUN Latest Ref Range: 4 - 21  26 (A) 23 (A)  Creatinine Latest Ref Range: 0.6 - 1.3  0.7 0.8  Calcium Latest Ref Range: 8.7 - 10.7  8.7 9.1  Alkaline Phosphatase Latest Ref Range: 25 - 125  103 95  Albumin Latest Ref Range: 3.5 - 5.0  4.0 3.8  AST Latest Ref Range: 14 - 40  28 25  ALT Latest Ref Range: 10 - 40  25 21  Bilirubin, Total Unknown 0.4 0.5   WBC Unknown 8.7 7.0  RBC Latest Ref Range: 3.87 - 5.11  4.68 4.77  Hemoglobin Latest Ref Range: 13.5 - 17.5  15.4 15.2  HCT Latest Ref Range: 41 - 53  45 45  Platelets Latest Ref Range: 150 - 399  256 249  NEUT# Unknown 6.44 4.69   ASSESSMENT & PLAN:  Assessment/Plan:  A 55 y.o. 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  In clinic today, I reviewed the CT results with him, for which he could see he remains disease free.  Understandably, he was pleased with his CT scan results.  He will proceed with his 10th cycle of adjuvant nivolumab immunotherapy this week, which is being given at 480 mg once every 4 weeks.  Clinically, the patient appears to be doing very well.  I will see him back in 4 weeks before he heads into his 11th  cycle of adjuvant nivolumab immunotherapy.  The patient understands all the plans discussed today and is in agreement with them.     I, Rita Ohara, am acting as scribe for [name], MD    I have reviewed this report as typed by the medical scribe, and it is complete and accurate.  Dequincy Macarthur Critchley, MD

## 2021-07-13 ENCOUNTER — Other Ambulatory Visit: Payer: Self-pay

## 2021-07-13 ENCOUNTER — Encounter (HOSPITAL_BASED_OUTPATIENT_CLINIC_OR_DEPARTMENT_OTHER): Payer: 59 | Admitting: Physician Assistant

## 2021-07-13 DIAGNOSIS — T8131XA Disruption of external operation (surgical) wound, not elsewhere classified, initial encounter: Secondary | ICD-10-CM | POA: Diagnosis not present

## 2021-07-13 NOTE — Progress Notes (Signed)
ARTHAS, VASIL ([id number]) Visit Report for 07/13/2021 Arrival Information Details Patient Name: Date of Service: [name] 07/13/2021 12:30 PM Medical Record Number: [id number] Patient Account Number: [id number] Date of Birth/Sex: Treating RN: Mar 14, 1966 (55 y.o. [name] Primary Care Daimian Sudberry: [name] Other Clinician: Referring Abbigale Mcelhaney: Treating Latreshia Beauchaine/Extender: [name] in Treatment: 3 Visit Information History Since Last Visit Added or deleted any medications: No Patient Arrived: Ambulatory Any new allergies or adverse reactions: No Arrival Time: 12:42 Had a fall or experienced change in No Accompanied By: wife activities of daily living that may affect Transfer Assistance: None risk of falls: Patient Identification Verified: Yes Signs or symptoms of abuse/neglect since last visito No Secondary Verification Process Completed: Yes Hospitalized since last visit: No Patient Requires Transmission-Based Precautions: No Implantable device outside of the clinic excluding No Patient Has Alerts: No cellular tissue based products placed in the center since last visit: Has Dressing in Place as Prescribed: Yes Pain Present Now: No Electronic Signature(s) Signed: 07/13/2021 5:22:48 PM By: Lorrin Jackson Entered By: Lorrin Jackson on 07/13/2021 12:42:41 -------------------------------------------------------------------------------- Clinic Level of Care Assessment Details Patient Name: Date of Service: [name] 07/13/2021 12:30 PM Medical Record Number: [id number] Patient Account Number: [id number] Date of Birth/Sex: Treating RN: 1966/04/16 (55 y.o. [name] Primary Care Janyia Guion: [name] Other Clinician: Referring Neymar Dowe: Treating Savayah Waltrip/Extender: [name] in Treatment: 3 Clinic Level of Care Assessment Items TOOL 4 Quantity Score X- 1 0 Use when only an  EandM is performed on FOLLOW-UP visit ASSESSMENTS - Nursing Assessment / Reassessment X- 1 10 Reassessment of Co-morbidities (includes updates in patient status) X- 1 5 Reassessment of Adherence to Treatment Plan ASSESSMENTS - Wound and Skin A ssessment / Reassessment X - Simple Wound Assessment / Reassessment - one wound 1 5 '[]'$  - 0 Complex Wound Assessment / Reassessment - multiple wounds '[]'$  - 0 Dermatologic / Skin Assessment (not related to wound area) ASSESSMENTS - Focused Assessment '[]'$  - 0 Circumferential Edema Measurements - multi extremities '[]'$  - 0 Nutritional Assessment / Counseling / Intervention '[]'$  - 0 Lower Extremity Assessment (monofilament, tuning fork, pulses) '[]'$  - 0 Peripheral Arterial Disease Assessment (using hand held doppler) ASSESSMENTS - Ostomy and/or Continence Assessment and Care '[]'$  - 0 Incontinence Assessment and Management '[]'$  - 0 Ostomy Care Assessment and Management (repouching, etc.) PROCESS - Coordination of Care '[]'$  - 0 Simple Patient / Family Education for ongoing care X- 1 20 Complex (extensive) Patient / Family Education for ongoing care X- 1 10 Staff obtains Programmer, systems, Records, T Results / Process Orders est '[]'$  - 0 Staff telephones HHA, Nursing Homes / Clarify orders / etc '[]'$  - 0 Routine Transfer to another Facility (non-emergent condition) '[]'$  - 0 Routine Hospital Admission (non-emergent condition) '[]'$  - 0 New Admissions / Biomedical engineer / Ordering NPWT Apligraf, etc. , '[]'$  - 0 Emergency Hospital Admission (emergent condition) '[]'$  - 0 Simple Discharge Coordination '[]'$  - 0 Complex (extensive) Discharge Coordination PROCESS - Special Needs '[]'$  - 0 Pediatric / Minor Patient Management '[]'$  - 0 Isolation Patient Management '[]'$  - 0 Hearing / Language / Visual special needs '[]'$  - 0 Assessment of Community assistance (transportation, D/C planning, etc.) '[]'$  - 0 Additional assistance / Altered mentation '[]'$  - 0 Support Surface(s)  Assessment (bed, cushion, seat, etc.) INTERVENTIONS - Wound Cleansing / Measurement X - Simple Wound Cleansing - one wound 1 5 '[]'$  - 0 Complex Wound Cleansing - multiple  wounds X- 1 5 Wound Imaging (photographs - any number of wounds) '[]'$  - 0 Wound Tracing (instead of photographs) X- 1 5 Simple Wound Measurement - one wound '[]'$  - 0 Complex Wound Measurement - multiple wounds INTERVENTIONS - Wound Dressings '[]'$  - 0 Small Wound Dressing one or multiple wounds X- 1 15 Medium Wound Dressing one or multiple wounds '[]'$  - 0 Large Wound Dressing one or multiple wounds '[]'$  - 0 Application of Medications - topical '[]'$  - 0 Application of Medications - injection INTERVENTIONS - Miscellaneous '[]'$  - 0 External ear exam '[]'$  - 0 Specimen Collection (cultures, biopsies, blood, body fluids, etc.) '[]'$  - 0 Specimen(s) / Culture(s) sent or taken to Lab for analysis '[]'$  - 0 Patient Transfer (multiple staff / Civil Service fast streamer / Similar devices) '[]'$  - 0 Simple Staple / Suture removal (25 or less) '[]'$  - 0 Complex Staple / Suture removal (26 or more) '[]'$  - 0 Hypo / Hyperglycemic Management (close monitor of Blood Glucose) '[]'$  - 0 Ankle / Brachial Index (ABI) - do not check if billed separately X- 1 5 Vital Signs Has the patient been seen at the hospital within the last three years: Yes Total Score: 85 Level Of Care: New/Established - Level 3 Electronic Signature(s) Signed: 07/13/2021 5:22:48 PM By: Lorrin Jackson Entered By: Lorrin Jackson on 07/13/2021 12:58:59 -------------------------------------------------------------------------------- Encounter Discharge Information Details Patient Name: Date of Service: [name] 07/13/2021 12:30 PM Medical Record Number: [id number] Patient Account Number: [id number] Date of Birth/Sex: Treating RN: December 13, 1965 (55 y.o. [name] Primary Care Chaitanya Amedee: [name] Other Clinician: Referring Kaycen Whitworth: Treating Jamielyn Petrucci/Extender: [name] in Treatment: 3 Encounter Discharge Information Items Discharge Condition: Stable Ambulatory Status: Ambulatory Discharge Destination: Home Transportation: Private Auto Accompanied By: wife Schedule Follow-up Appointment: Yes Clinical Summary of Care: Provided on 07/13/2021 Form Type Recipient Paper Patient Patient Electronic Signature(s) Signed: 07/13/2021 5:22:48 PM By: Lorrin Jackson Entered By: Lorrin Jackson on 07/13/2021 13:05:01 -------------------------------------------------------------------------------- Lower Extremity Assessment Details Patient Name: Date of Service: [name] 07/13/2021 12:30 PM Medical Record Number: [id number] Patient Account Number: [id number] Date of Birth/Sex: Treating RN: September 12, 1966 (55 y.o. [name] Primary Care Tamar Lipscomb: [name] Other Clinician: Referring Houston Zapien: Treating Daymond Cordts/Extender: [name] in Treatment: 3 Electronic Signature(s) Signed: 07/13/2021 5:22:48 PM By: Lorrin Jackson Entered By: Lorrin Jackson on 07/13/2021 12:43:18 -------------------------------------------------------------------------------- Multi-Disciplinary Care Plan Details Patient Name: Date of Service: [name] 07/13/2021 12:30 PM Medical Record Number: [id number] Patient Account Number: [id number] Date of Birth/Sex: Treating RN: 1966/07/31 (55 y.o. [name] Primary Care Hykeem Ojeda: [name] Other Clinician: Referring Ladamien Rammel: Treating Mikyla Schachter/Extender: [name] in Treatment: [place] reviewed with physician Active Inactive Wound/Skin Impairment Nursing Diagnoses: Impaired tissue integrity Knowledge deficit related to ulceration/compromised skin integrity Goals: Patient/caregiver will verbalize understanding of skin care regimen Date Initiated: 06/22/2021 Target Resolution Date: 07/20/2021 Goal  Status: Active Ulcer/skin breakdown will have a volume reduction of 30% by week 4 Date Initiated: 06/22/2021 Target Resolution Date: 07/20/2021 Goal Status: Active Interventions: Assess patient/caregiver ability to obtain necessary supplies Assess patient/caregiver ability to perform ulcer/skin care regimen upon admission and as needed Assess ulceration(s) every visit Provide education on ulcer and skin care Treatment Activities: Skin care regimen initiated : 06/22/2021 Topical wound management initiated : 06/22/2021 Notes: Electronic Signature(s) Signed: 07/13/2021 5:22:48 PM By: Lorrin Jackson Entered By: Lorrin Jackson on 07/13/2021 12:48:26 -------------------------------------------------------------------------------- Pain Assessment Details Patient  Name: Date of Service: [name] 07/13/2021 12:30 PM Medical Record Number: [id number] Patient Account Number: [id number] Date of Birth/Sex: Treating RN: November 11, 1966 (55 y.o. [name] Primary Care Eiliyah Reh: [name] Other Clinician: Referring Prescious Hurless: Treating Heavenlee Maiorana/Extender: [name] in Treatment: 3 Active Problems Location of Pain Severity and Description of Pain Patient Has Paino No Site Locations Pain Management and Medication Current Pain Management: Electronic Signature(s) Signed: 07/13/2021 5:22:48 PM By: Lorrin Jackson Entered By: Lorrin Jackson on 07/13/2021 12:43:12 -------------------------------------------------------------------------------- Patient/Caregiver Education Details Patient Name: Date of Service: [name] 8/24/2022andnbsp12:30 PM Medical Record Number: [id number] Patient Account Number: [id number] Date of Birth/Gender: Treating RN: Mar 24, 1966 (55 y.o. [name] Primary Care Physician: [name] Other Clinician: Referring Physician: Treating Physician/Extender: [name] in Treatment:  3 Education Assessment Education Provided To: Patient Education Topics Provided Wound/Skin Impairment: Methods: Demonstration, Explain/Verbal, Printed Responses: State content correctly Electronic Signature(s) Signed: 07/13/2021 5:22:48 PM By: Lorrin Jackson Entered By: Lorrin Jackson on 07/13/2021 12:48:43 -------------------------------------------------------------------------------- Wound Assessment Details Patient Name: Date of Service: [name] 07/13/2021 12:30 PM Medical Record Number: [id number] Patient Account Number: [id number] Date of Birth/Sex: Treating RN: [birth year] (55 y.o. [name] Primary Care Kendi Defalco: [name] Other Clinician: Referring Edie Vallandingham: Treating Shenia Alan/Extender: [name] in Treatment: 3 Wound Status Wound Number: 1 Primary Etiology: Open Surgical Wound Wound Location: Abdomen - midline Wound Status: Open Wounding Event: Surgical Injury Comorbid History: Received Chemotherapy, Received Radiation Date Acquired: 09/30/2020 Weeks Of Treatment: 3 Clustered Wound: No Photos Wound Measurements Length: (cm) 8.8 Width: (cm) 6.3 Depth: (cm) 0.1 Area: (cm) 43.542 Volume: (cm) 4.354 % Reduction in Area: 16.4% % Reduction in Volume: 16.4% Epithelialization: Small (1-33%) Tunneling: No Undermining: No Wound Description Classification: Full Thickness Without Exposed Support Structures Wound Margin: Distinct, outline attached Exudate Amount: Medium Exudate Type: Serosanguineous Exudate Color: red, brown Foul Odor After Cleansing: No Slough/Fibrino No Wound Bed Granulation Amount: Large (67-100%) Exposed Structure Granulation Quality: Red, Hyper-granulation Fascia Exposed: No Necrotic Amount: None Present (0%) Fat Layer (Subcutaneous Tissue) Exposed: Yes Tendon Exposed: No Muscle Exposed: No Joint Exposed: No Bone Exposed: No Treatment Notes Wound #1 (Abdomen -  midline) Cleanser Soap and Water Discharge Instruction: May shower and wash wound with dial antibacterial soap and water prior to dressing change. Wound Cleanser Discharge Instruction: Cleanse the wound with wound cleanser prior to applying a clean dressing using gauze sponges, not tissue or cotton balls. Peri-Wound Care Topical Primary Dressing Hydrofera Blue Ready Foam, 4x5 in Discharge Instruction: Apply to wound bed as instructed Secondary Dressing ABD Pad, 8x10 Discharge Instruction: Apply over primary dressing as directed. Secured With 91M Medipore H Soft Cloth Surgical T 4 x 2 (in/yd) ape Discharge Instruction: Secure dressing with tape as directed. Compression Wrap Compression Stockings Add-Ons Electronic Signature(s) Signed: 07/13/2021 5:22:48 PM By: Lorrin Jackson Entered By: Lorrin Jackson on 07/13/2021 12:47:57 -------------------------------------------------------------------------------- Vitals Details Patient Name: Date of Service: [name] 07/13/2021 12:30 PM Medical Record Number: [id number] Patient Account Number: [id number] Date of Birth/Sex: Treating RN: 05-Jan-1966 (55 y.o. [name] Primary Care Keneisha Heckart: [name] Other Clinician: Referring Amarius Toto: Treating Kodi Guerrera/Extender: [name] in Treatment: 3 Vital Signs Time Taken: 12:42 Temperature (F): 98.6 Height (in): 69 Pulse (bpm): 79 Weight (lbs): 191 Respiratory Rate (breaths/min): 16 Body Mass Index (BMI): 28.2 Blood Pressure (mmHg): 117/81 Reference Range: 80 - 120 mg / dl  Electronic Signature(s) Signed: 07/13/2021 5:22:48 PM By: Lorrin Jackson Entered By: Lorrin Jackson on 07/13/2021 12:43:05

## 2021-07-13 NOTE — Progress Notes (Addendum)
Thomas, Gonzalez ([id number]) Visit Report for 07/13/2021 Chief Complaint Document Details Patient Name: Date of Service: Thomas Gonzalez 07/13/2021 12:30 PM Medical Record Number: [id number] Patient Account Number: [id number] Date of Birth/Sex: Treating RN: [birth year] (55 y.o. Thomas Gonzalez Primary Care Provider: Reinaldo Gonzalez Other Clinician: Referring Provider: Treating Provider/Extender: Thomas Gonzalez in Treatment: 3 Information Obtained from: Patient Chief Complaint Surgical Ulcer Electronic Signature(s) Signed: 07/13/2021 12:51:15 PM By: Thomas Keeler PA-C Entered By: Thomas Gonzalez on 07/13/2021 12:51:14 -------------------------------------------------------------------------------- HPI Details Patient Name: Date of Service: Thomas Gonzalez. 07/13/2021 12:30 PM Medical Record Number: [id number] Patient Account Number: [id number] Date of Birth/Sex: Treating RN: 03/02/1966 (55 y.o. Thomas Gonzalez Primary Care Provider: Reinaldo Gonzalez Other Clinician: Referring Provider: Treating Provider/Extender: Thomas Gonzalez in Treatment: 3 History of Present Illness HPI Description: 06/22/2021 patient presents today as a pleasant gentleman who unfortunately has been having quite a bit of an issue with his abdominal area following surgery in 2021 November. 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 Subsequently he also was on immunotherapy following to try to help with any residual carcinoma following the procedure. Nonetheless he had an issue with the incision site in the mid abdomen and unfortunately this ended up dehiscing and has had trouble since getting this healed. He has been seen by Thomas Gonzalez him where ACell was utilized but again there is really no response she referred the patient to [place] for further evaluation and treatment. No chemical cauterization with silver nitrate has  been utilized. He also has not had any he tells me aggressive sharp debridement of the area to clear away some of this hypergranular tissue which he has significant amounts noted. Either way I think that we will get a need to try to see about removing this I do think, to send sample of this to biopsy just to make sure there is no signs of cancer noted although I really think that is probably not can be the case I think we will get a get a report back stating hypergranulation tissue. With that being said the patient does seem to have quite a bit of bleeding and friability even with wiping over the wound area and again I think this is a big part of the issue again he can even grow new skin because anything attaching is attaching to the hypergranulation which is not even get a be anywhere close to sufficient for good epithelial growth. He also has had a CT scan in April which showed there was post esophagectomy with gastric pull-up anatomy but no evidence of local recurrence. There is no evidence of mediastinal nodal metastasis. He did have small bilateral pulmonary nodules which were unchanged recommend routine surveillance. Otherwise the patient has a ventral hernia following the surgery but no evidence of obstruction or gangrene. 06/28/2021 upon evaluation today patient's wound actually appears to be doing quite a bit better and it is hypergranular tissue is significantly improved compared to last time. With that being said they had a hard time getting the [facility] I am not sure exactly what all happened but the good news is that is something that should be on the way and he should have that if not today then tomorrow. Overall I think that with the [facility] this would have looked even better but nonetheless I am still pleased with what I am seeing today. 07/06/2021 upon evaluation today patient's  wound is actually showing signs of improvement and in general I feel like he is making excellent  progress. The Hydrofera Blue dressing to be doing excellent for him it does cause a little bit of bleeding when he removes it during dressing changes but other than that he is doing quite well based on what I am seeing. Fortunately there does not appear to be any signs of active infection at this time. No fevers, chills, nausea, vomiting, or diarrhea. 07/13/2021 upon evaluation today patient appears to be doing well with regard to his abdominal ulcer. Overall I am extremely pleased with where things stand I think he is making good progress. I do think the Hydrofera Blue is doing a great job as far as treating this is concerned. I do believe that as long as he is seeing improvement we should continue as such. We may be able to contemplate some other options such as skin substitutes but right now when see how we do with this. Electronic Signature(s) Signed: 07/13/2021 1:39:32 PM By: Thomas Keeler PA-C Previous Signature: 07/13/2021 1:39:08 PM Version By: Thomas Keeler PA-C Entered By: Thomas Gonzalez on 07/13/2021 13:39:32 -------------------------------------------------------------------------------- Physical Exam Details Patient Name: Date of Service: Thomas Gonzalez 07/13/2021 12:30 PM Medical Record Number: [id number] Patient Account Number: [id number] Date of Birth/Sex: Treating RN: [birth year] (55 y.o. Thomas Gonzalez Primary Care Provider: Reinaldo Gonzalez Other Clinician: Referring Provider: Treating Provider/Extender: Thomas Gonzalez in Treatment: 3 Constitutional Well-nourished and well-hydrated in no acute distress. Respiratory normal breathing without difficulty. Psychiatric this patient is able to make decisions and demonstrates good insight into disease process. Alert and Oriented x 3. pleasant and cooperative. Notes Upon inspection patient's wound bed showed signs of good granulation epithelization at this point. Fortunately there does not appear  to be any signs of active infection which is great news and overall I am extremely pleased with where we stand today. I do believe the [facility] is doing an excellent job at controlling the hypergranulation which is exactly what he needed. No sharp debridement was necessary and no silver nitrate was performed today. We will continue to have a look and see how things progress over the next several weeks. Electronic Signature(s) Signed: 07/13/2021 1:39:49 PM By: Thomas Keeler PA-C Entered By: Thomas Gonzalez on 07/13/2021 13:39:49 -------------------------------------------------------------------------------- Physician Orders Details Patient Name: Date of Service: Thomas Gonzalez 07/13/2021 12:30 PM Medical Record Number: [id number] Patient Account Number: [id number] Date of Birth/Sex: Treating RN: 1965/11/26 (55 y.o. [name] Primary Care Provider: Reinaldo Gonzalez Other Clinician: Referring Provider: Treating Provider/Extender: Thomas Gonzalez in Treatment: 3 Verbal / Phone Orders: No Diagnosis Coding ICD-10 Coding Code Description T81.31XA Disruption of external operation (surgical) wound, not elsewhere classified, initial encounter L98.492 Non-pressure chronic ulcer of skin of other sites with fat layer exposed Z85.01 Personal history of malignant neoplasm of esophagus K43.9 Ventral hernia without obstruction or gangrene Follow-up Appointments Return appointment in 3 weeks. - with [facility] Shower/ Hygiene May shower and wash wound with soap and water. Additional Orders / Instructions Follow Nutritious Diet - Increase protein Wound Treatment Wound #1 - Abdomen - midline Cleanser: Soap and Water Every Other Day/Other:90 days Discharge Instructions: May shower and wash wound with dial antibacterial soap and water prior to dressing change. Cleanser: Wound Cleanser Every Other Day/Other:90 days Discharge Instructions: Cleanse the wound  with wound cleanser prior to applying a clean dressing using gauze  sponges, not tissue or cotton balls. Prim Dressing: Hydrofera Blue Ready Foam, 4x5 in Every Other Day/Other:90 days ary Discharge Instructions: Apply to wound bed as instructed Secondary Dressing: ABD Pad, 8x10 Every Other Day/Other:90 days Discharge Instructions: Apply over primary dressing as directed. Secured With: 28M Medipore H Soft Cloth Surgical T 4 x 2 (in/yd) Every Other Day/Other:90 days ape Discharge Instructions: Secure dressing with tape as directed. Electronic Signature(s) Signed: 07/13/2021 5:22:48 PM By: Lorrin Jackson Signed: 07/13/2021 6:37:42 PM By: Thomas Keeler PA-C Entered By: Lorrin Jackson on 07/13/2021 12:58:17 -------------------------------------------------------------------------------- Problem List Details Patient Name: Date of Service: Thomas Thomas Bering K. 07/13/2021 12:30 PM Medical Record Number: [id number] Patient Account Number: [id number] Date of Birth/Sex: Treating RN: 1966/11/03 (55 y.o. [name] Primary Care Provider: Reinaldo Gonzalez Other Clinician: Referring Provider: Treating Provider/Extender: Thomas Gonzalez in Treatment: 3 Active Problems ICD-10 Encounter Code Description Active Date MDM Diagnosis T81.31XA Disruption of external operation (surgical) wound, not elsewhere classified, 06/22/2021 No Yes initial encounter L98.492 Non-pressure chronic ulcer of skin of other sites with fat layer exposed 06/22/2021 No Yes Z85.01 Personal history of malignant neoplasm of esophagus 06/22/2021 No Yes K43.9 Ventral hernia without obstruction or gangrene 06/22/2021 No Yes Inactive Problems Resolved Problems Electronic Signature(s) Signed: 07/13/2021 12:51:09 PM By: Thomas Keeler PA-C Entered By: Thomas Gonzalez on 07/13/2021 12:51:09 -------------------------------------------------------------------------------- Progress Note Details Patient Name: Date  of Service: Thomas Thomas Bering K. 07/13/2021 12:30 PM Medical Record Number: [id number] Patient Account Number: [id number] Date of Birth/Sex: Treating RN: Apr 12, 1966 (55 y.o. Thomas Gonzalez Primary Care Provider: Reinaldo Gonzalez Other Clinician: Referring Provider: Treating Provider/Extender: Thomas Gonzalez in Treatment: 3 Subjective Chief Complaint Information obtained from Patient Surgical Ulcer History of Present Illness (HPI) 06/22/2021 patient presents today as a pleasant gentleman who unfortunately has been having quite a bit of an issue with his abdominal area following surgery in 2021 November. 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 Subsequently he also was on immunotherapy following to try to help with any residual carcinoma following the procedure. Nonetheless he had an issue with the incision site in the mid abdomen and unfortunately this ended up dehiscing and has had trouble since getting this healed. He has been seen by Thomas Gonzalez him where ACell was utilized but again there is really no response she referred the patient to [place] for further evaluation and treatment. No chemical cauterization with silver nitrate has been utilized. He also has not had any he tells me aggressive sharp debridement of the area to clear away some of this hypergranular tissue which he has significant amounts noted. Either way I think that we will get a need to try to see about removing this I do think, to send sample of this to biopsy just to make sure there is no signs of cancer noted although I really think that is probably not can be the case I think we will get a get a report back stating hypergranulation tissue. With that being said the patient does seem to have quite a bit of bleeding and friability even with wiping over the wound area and again I think this is a big part of the issue again he can even grow new skin because anything  attaching is attaching to the hypergranulation which is not even get a be anywhere close to sufficient for good epithelial growth. He also has had a CT scan in April which showed  there was post esophagectomy with gastric pull-up anatomy but no evidence of local recurrence. There is no evidence of mediastinal nodal metastasis. He did have small bilateral pulmonary nodules which were unchanged recommend routine surveillance. Otherwise the patient has a ventral hernia following the surgery but no evidence of obstruction or gangrene. 06/28/2021 upon evaluation today patient's wound actually appears to be doing quite a bit better and it is hypergranular tissue is significantly improved compared to last time. With that being said they had a hard time getting the [facility] I am not sure exactly what all happened but the good news is that is something that should be on the way and he should have that if not today then tomorrow. Overall I think that with the [facility] this would have looked even better but nonetheless I am still pleased with what I am seeing today. 07/06/2021 upon evaluation today patient's wound is actually showing signs of improvement and in general I feel like he is making excellent progress. The Hydrofera Blue dressing to be doing excellent for him it does cause a little bit of bleeding when he removes it during dressing changes but other than that he is doing quite well based on what I am seeing. Fortunately there does not appear to be any signs of active infection at this time. No fevers, chills, nausea, vomiting, or diarrhea. 07/13/2021 upon evaluation today patient appears to be doing well with regard to his abdominal ulcer. Overall I am extremely pleased with where things stand I think he is making good progress. I do think the Hydrofera Blue is doing a great job as far as treating this is concerned. I do believe that as long as he is seeing improvement we should continue as  such. We may be able to contemplate some other options such as skin substitutes but right now when see how we do with this. Objective Constitutional Well-nourished and well-hydrated in no acute distress. Vitals Time Taken: 12:42 PM, Height: 69 in, Weight: 191 lbs, BMI: 28.2, Temperature: 98.6 F, Pulse: 79 bpm, Respiratory Rate: 16 breaths/min, Blood Pressure: 117/81 mmHg. Respiratory normal breathing without difficulty. Psychiatric this patient is able to make decisions and demonstrates good insight into disease process. Alert and Oriented x 3. pleasant and cooperative. General Notes: Upon inspection patient's wound bed showed signs of good granulation epithelization at this point. Fortunately there does not appear to be any signs of active infection which is great news and overall I am extremely pleased with where we stand today. I do believe the [facility] is doing an excellent job at controlling the hypergranulation which is exactly what he needed. No sharp debridement was necessary and no silver nitrate was performed today. We will continue to have a look and see how things progress over the next several weeks. Integumentary (Hair, Skin) Wound #1 status is Open. Original cause of wound was Surgical Injury. The date acquired was: 09/30/2020. The wound has been in treatment 3 weeks. The wound is located on the Abdomen - midline. The wound measures 8.8cm length x 6.3cm width x 0.1cm depth; 43.542cm^2 area and 4.354cm^3 volume. There is Fat Layer (Subcutaneous Tissue) exposed. There is no tunneling or undermining noted. There is a medium amount of serosanguineous drainage noted. The wound margin is distinct with the outline attached to the wound base. There is large (67-100%) red, hyper - granulation within the wound bed. There is no necrotic tissue within the wound bed. Assessment Active Problems ICD-10 Disruption of external operation (  surgical) wound, not elsewhere classified,  initial encounter Non-pressure chronic ulcer of skin of other sites with fat layer exposed Personal history of malignant neoplasm of esophagus Ventral hernia without obstruction or gangrene Plan Follow-up Appointments: Return appointment in 3 weeks. - with [facility] Shower/ Hygiene: May shower and wash wound with soap and water. Additional Orders / Instructions: Follow Nutritious Diet - Increase protein WOUND #1: - Abdomen - midline Wound Laterality: Cleanser: Soap and Water Every Other Day/Other:90 days Discharge Instructions: May shower and wash wound with dial antibacterial soap and water prior to dressing change. Cleanser: Wound Cleanser Every Other Day/Other:90 days Discharge Instructions: Cleanse the wound with wound cleanser prior to applying a clean dressing using gauze sponges, not tissue or cotton balls. Prim Dressing: Hydrofera Blue Ready Foam, 4x5 in Every Other Day/Other:90 days ary Discharge Instructions: Apply to wound bed as instructed Secondary Dressing: ABD Pad, 8x10 Every Other Day/Other:90 days Discharge Instructions: Apply over primary dressing as directed. Secured With: 24M Medipore H Soft Cloth Surgical T 4 x 2 (in/yd) Every Other Day/Other:90 days ape Discharge Instructions: Secure dressing with tape as directed. 1. Would recommend that we going continue with wound care measures as before and the patient is in agreement with plan this includes the use of the [facility] dressing which I think is doing a great job. 2. I am also going to recommend an ABD pad to cover followed by securing this with tape. 3. I am also can recommend that the patient continue to monitor for any signs of worsening or infection if he develops any issues he will let me know. We will see patient back for reevaluation in 3 weeks here in the clinic. If anything worsens or changes patient will contact our office for additional recommendations. Electronic Signature(s) Signed: 07/13/2021  1:40:26 PM By: Thomas Keeler PA-C Entered By: Thomas Gonzalez on 07/13/2021 13:40:26 -------------------------------------------------------------------------------- SuperBill Details Patient Name: Date of Service: Thomas Gonzalez 07/13/2021 Medical Record Number: [id number] Patient Account Number: [id number] Date of Birth/Sex: Treating RN: 05/26/1966 (55 y.o. [name] Primary Care Provider: Reinaldo Gonzalez Other Clinician: Referring Provider: Treating Provider/Extender: Thomas Gonzalez in Treatment: 3 Diagnosis Coding ICD-10 Codes Code Description T81.31XA Disruption of external operation (surgical) wound, not elsewhere classified, initial encounter L98.492 Non-pressure chronic ulcer of skin of other sites with fat layer exposed Z85.01 Personal history of malignant neoplasm of esophagus K43.9 Ventral hernia without obstruction or gangrene Facility Procedures CPT4 Code: [id number] Description: 99213 - WOUND CARE VISIT-LEV 3 EST PT Modifier: Quantity: 1 Physician Procedures : CPT4 Code Description Modifier [id number] - WC PHYS LEVEL 4 - EST PT ICD-10 Diagnosis Description T81.31XA Disruption of external operation (surgical) wound, not elsewhere classified, initial encounter L98.492 Non-pressure chronic ulcer of skin of  other sites with fat layer exposed Z85.01 Personal history of malignant neoplasm of esophagus K43.9 Ventral hernia without obstruction or gangrene Quantity: 1 Electronic Signature(s) Signed: 07/13/2021 1:40:57 PM By: Thomas Keeler PA-C Entered By: Thomas Gonzalez on 07/13/2021 13:40:57

## 2021-07-14 LAB — BASIC METABOLIC PANEL
BUN: 23 — AB (ref 4–21)
CO2: 28 — AB (ref 13–22)
Chloride: 101 (ref 99–108)
Creatinine: 0.8 (ref 0.6–1.3)
Glucose: 106
Potassium: 4.5 (ref 3.4–5.3)
Sodium: 137 (ref 137–147)

## 2021-07-14 LAB — CBC AND DIFFERENTIAL
HCT: 45 (ref 41–53)
Hemoglobin: 15.2 (ref 13.5–17.5)
Neutrophils Absolute: 4.69
Platelets: 249 (ref 150–399)
WBC: 7

## 2021-07-14 LAB — CBC: RBC: 4.77 (ref 3.87–5.11)

## 2021-07-14 LAB — COMPREHENSIVE METABOLIC PANEL
Albumin: 3.8 (ref 3.5–5.0)
Calcium: 9.1 (ref 8.7–10.7)

## 2021-07-14 LAB — HEPATIC FUNCTION PANEL
ALT: 21 (ref 10–40)
AST: 25 (ref 14–40)
Alkaline Phosphatase: 95 (ref 25–125)
Bilirubin, Total: 0.5

## 2021-07-19 ENCOUNTER — Telehealth: Payer: Self-pay | Admitting: Oncology

## 2021-07-19 ENCOUNTER — Encounter: Payer: Self-pay | Admitting: Oncology

## 2021-07-19 ENCOUNTER — Inpatient Hospital Stay (INDEPENDENT_AMBULATORY_CARE_PROVIDER_SITE_OTHER): Payer: 59 | Admitting: Oncology

## 2021-07-19 VITALS — BP 134/91 | HR 98 | Temp 98.4°F | Resp 16 | Ht 69.0 in | Wt 193.1 lb

## 2021-07-19 DIAGNOSIS — C159 Malignant neoplasm of esophagus, unspecified: Secondary | ICD-10-CM

## 2021-07-19 NOTE — Telephone Encounter (Signed)
Per 8/30 LOS, patient scheduled for Sept/Oct Appt's.  Gave patient Appt Summary

## 2021-07-20 ENCOUNTER — Encounter: Payer: Self-pay | Admitting: Oncology

## 2021-07-21 ENCOUNTER — Ambulatory Visit: Payer: 59

## 2021-07-27 ENCOUNTER — Other Ambulatory Visit: Payer: Self-pay | Admitting: Hematology and Oncology

## 2021-07-27 ENCOUNTER — Inpatient Hospital Stay: Payer: 59 | Attending: Oncology

## 2021-07-27 DIAGNOSIS — C159 Malignant neoplasm of esophagus, unspecified: Secondary | ICD-10-CM

## 2021-07-27 DIAGNOSIS — C160 Malignant neoplasm of cardia: Secondary | ICD-10-CM | POA: Diagnosis present

## 2021-07-27 DIAGNOSIS — Z5112 Encounter for antineoplastic immunotherapy: Secondary | ICD-10-CM | POA: Diagnosis not present

## 2021-07-27 LAB — BASIC METABOLIC PANEL
BUN: 22 — AB (ref 4–21)
CO2: 26 — AB (ref 13–22)
Chloride: 105 (ref 99–108)
Creatinine: 0.9 (ref 0.6–1.3)
Glucose: 167
Potassium: 4.1 (ref 3.4–5.3)
Sodium: 138 (ref 137–147)

## 2021-07-27 LAB — COMPREHENSIVE METABOLIC PANEL
Albumin: 3.8 (ref 3.5–5.0)
Calcium: 8.9 (ref 8.7–10.7)

## 2021-07-27 LAB — CBC AND DIFFERENTIAL
HCT: 43 (ref 41–53)
Hemoglobin: 15 (ref 13.5–17.5)
Neutrophils Absolute: 4.69
Platelets: 220 (ref 150–399)
WBC: 6.6

## 2021-07-27 LAB — HEPATIC FUNCTION PANEL
ALT: 24 (ref 10–40)
AST: 29 (ref 14–40)
Alkaline Phosphatase: 102 (ref 25–125)
Bilirubin, Total: 0.4

## 2021-07-27 LAB — TSH: TSH: 0.57 u[IU]/mL (ref 0.350–4.500)

## 2021-07-27 LAB — CBC: RBC: 4.65 (ref 3.87–5.11)

## 2021-07-28 LAB — T4: T4, Total: 6.5 ug/dL (ref 4.5–12.0)

## 2021-07-28 MED FILL — Nivolumab IV Soln 100 MG/10ML: INTRAVENOUS | Qty: 48 | Status: AC

## 2021-07-29 ENCOUNTER — Other Ambulatory Visit: Payer: Self-pay

## 2021-07-29 ENCOUNTER — Inpatient Hospital Stay: Payer: 59

## 2021-07-29 VITALS — BP 139/87 | HR 87 | Temp 98.1°F | Resp 18 | Ht 69.0 in | Wt 192.0 lb

## 2021-07-29 DIAGNOSIS — Z5112 Encounter for antineoplastic immunotherapy: Secondary | ICD-10-CM | POA: Diagnosis not present

## 2021-07-29 DIAGNOSIS — C159 Malignant neoplasm of esophagus, unspecified: Secondary | ICD-10-CM

## 2021-07-29 MED ORDER — HEPARIN SOD (PORK) LOCK FLUSH 100 UNIT/ML IV SOLN
500.0000 [IU] | Freq: Once | INTRAVENOUS | Status: AC | PRN
  Administered 2021-07-29: 500 [IU]

## 2021-07-29 MED ORDER — SODIUM CHLORIDE 0.9 % IV SOLN
480.0000 mg | Freq: Once | INTRAVENOUS | Status: AC
  Administered 2021-07-29: 480 mg via INTRAVENOUS
  Filled 2021-07-29: qty 48

## 2021-07-29 MED ORDER — SODIUM CHLORIDE 0.9 % IV SOLN: Freq: Once | INTRAVENOUS | Status: AC

## 2021-07-29 MED ORDER — SODIUM CHLORIDE 0.9% FLUSH
10.0000 mL | INTRAVENOUS | Status: DC | PRN
  Administered 2021-07-29: 10 mL

## 2021-07-29 NOTE — Patient Instructions (Signed)
Nivolumab injection What is this medication? NIVOLUMAB (nye VOL ue mab) is a monoclonal antibody. It treats certain types of cancer. Some of the cancers treated are colon cancer, head and neck cancer,Hodgkin lymphoma, lung cancer, and melanoma. This medicine may be used for other purposes; ask your health care provider orpharmacist if you have questions. COMMON BRAND NAME(S): Opdivo What should I tell my care team before I take this medication? They need to know if you have any of these conditions: autoimmune diseases like Crohn's disease, ulcerative colitis, or lupus have had or planning to have an allogeneic stem cell transplant (uses someone else's stem cells) history of chest radiation history of organ transplant nervous system problems like myasthenia gravis or Guillain-Barre syndrome an unusual or allergic reaction to nivolumab, other medicines, foods, dyes, or preservatives pregnant or trying to get pregnant breast-feeding How should I use this medication? This medicine is for infusion into a vein. It is given by a health careprofessional in a hospital or clinic setting. A special MedGuide will be given to you before each treatment. Be sure to readthis information carefully each time. Talk to your pediatrician regarding the use of this medicine in children. While this drug may be prescribed for children as young as 12 years for selectedconditions, precautions do apply. Overdosage: If you think you have taken too much of this medicine contact apoison control center or emergency room at once. NOTE: This medicine is only for you. Do not share this medicine with others. What if I miss a dose? It is important not to miss your dose. Call your doctor or health careprofessional if you are unable to keep an appointment. What may interact with this medication? Interactions have not been studied. This list may not describe all possible interactions. Give your health care provider a list of all  the medicines, herbs, non-prescription drugs, or dietary supplements you use. Also tell them if you smoke, drink alcohol, or use illegaldrugs. Some items may interact with your medicine. What should I watch for while using this medication? This drug may make you feel generally unwell. Continue your course of treatmenteven though you feel ill unless your doctor tells you to stop. You may need blood work done while you are taking this medicine. Do not become pregnant while taking this medicine or for 5 months after stopping it. Women should inform their doctor if they wish to become pregnant or think they might be pregnant. There is a potential for serious side effects to an unborn child. Talk to your health care professional or pharmacist for more information. Do not breast-feed an infant while taking this medicine orfor 5 months after stopping it. What side effects may I notice from receiving this medication? Side effects that you should report to your doctor or health care professionalas soon as possible: allergic reactions like skin rash, itching or hives, swelling of the face, lips, or tongue breathing problems blood in the urine bloody or watery diarrhea or black, tarry stools changes in emotions or moods changes in vision chest pain cough dizziness feeling faint or lightheaded, falls fever, chills headache with fever, neck stiffness, confusion, loss of memory, sensitivity to light, hallucination, loss of contact with reality, or seizures joint pain mouth sores redness, blistering, peeling or loosening of the skin, including inside the mouth severe muscle pain or weakness signs and symptoms of high blood sugar such as dizziness; dry mouth; dry skin; fruity breath; nausea; stomach pain; increased hunger or thirst; increased urination signs and symptoms of   kidney injury like trouble passing urine or change in the amount of urine signs and symptoms of liver injury like dark yellow or brown  urine; general ill feeling or flu-like symptoms; light-colored stools; loss of appetite; nausea; right upper belly pain; unusually weak or tired; yellowing of the eyes or skin swelling of the ankles, feet, hands trouble passing urine or change in the amount of urine unusually weak or tired weight gain or loss Side effects that usually do not require medical attention (report to yourdoctor or health care professional if they continue or are bothersome): bone pain constipation decreased appetite diarrhea muscle pain nausea, vomiting tiredness This list may not describe all possible side effects. Call your doctor for medical advice about side effects. You may report side effects to FDA at1-800-FDA-1088. Where should I keep my medication? This drug is given in a hospital or clinic and will not be stored at home. NOTE: This sheet is a summary. It may not cover all possible information. If you have questions about this medicine, talk to your doctor, pharmacist, orhealth care provider.  2022 Elsevier/Gold Standard (2020-03-10 10:08:25)  

## 2021-08-03 ENCOUNTER — Encounter (HOSPITAL_BASED_OUTPATIENT_CLINIC_OR_DEPARTMENT_OTHER): Payer: 59 | Attending: Physician Assistant | Admitting: Physician Assistant

## 2021-08-03 ENCOUNTER — Other Ambulatory Visit: Payer: Self-pay

## 2021-08-03 DIAGNOSIS — T8131XA Disruption of external operation (surgical) wound, not elsewhere classified, initial encounter: Secondary | ICD-10-CM | POA: Diagnosis not present

## 2021-08-03 DIAGNOSIS — L98492 Non-pressure chronic ulcer of skin of other sites with fat layer exposed: Secondary | ICD-10-CM | POA: Insufficient documentation

## 2021-08-03 DIAGNOSIS — K439 Ventral hernia without obstruction or gangrene: Secondary | ICD-10-CM | POA: Insufficient documentation

## 2021-08-03 DIAGNOSIS — X58XXXA Exposure to other specified factors, initial encounter: Secondary | ICD-10-CM | POA: Insufficient documentation

## 2021-08-03 DIAGNOSIS — Z8501 Personal history of malignant neoplasm of esophagus: Secondary | ICD-10-CM | POA: Diagnosis not present

## 2021-08-03 NOTE — Progress Notes (Addendum)
ELION, MATTIELLO ([id number]) Visit Report for 08/03/2021 Chief Complaint Document Details Patient Name: Date of Service: MA [name] 08/03/2021 2:00 PM Medical Record Number: [id number] Patient Account Number: [id number] Date of Birth/Sex: Treating RN: 1966/01/26 (55 y.o. [name] Primary Care Provider: [name] Other Clinician: Donavan Burnet Referring Provider: Treating Provider/Extender: [name] in Treatment: 6 Information Obtained from: Patient Chief Complaint Surgical Ulcer Electronic Signature(s) Signed: 08/03/2021 3:25:46 PM By: Worthy Keeler PA-C Entered By: Worthy Keeler on 08/03/2021 15:25:46 -------------------------------------------------------------------------------- HPI Details Patient Name: Date of Service: [name] 08/03/2021 2:00 PM Medical Record Number: [id number] Patient Account Number: [id number] Date of Birth/Sex: Treating RN: Mar 06, 1966 (55 y.o. [name] Primary Care Provider: [name] Other Clinician: Donavan Burnet Referring Provider: Treating Provider/Extender: [name] in Treatment: 6 History of Present Illness HPI Description: 06/22/2021 patient presents today as a pleasant gentleman who unfortunately has been having quite a bit of an issue with his abdominal area following surgery in 2021 November. 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 Subsequently he also was on immunotherapy following to try to help with any residual carcinoma following the procedure. Nonetheless he had an issue with the incision site in the mid abdomen and unfortunately this ended up dehiscing and has had trouble since getting this healed. He has been seen by Dr. [name] him where ACell was utilized but again there is really no response she referred the patient to [place] for further evaluation and treatment. No chemical  cauterization with silver nitrate has been utilized. He also has not had any he tells me aggressive sharp debridement of the area to clear away some of this hypergranular tissue which he has significant amounts noted. Either way I think that we will get a need to try to see about removing this I do think, to send sample of this to biopsy just to make sure there is no signs of cancer noted although I really think that is probably not can be the case I think we will get a get a report back stating hypergranulation tissue. With that being said the patient does seem to have quite a bit of bleeding and friability even with wiping over the wound area and again I think this is a big part of the issue again he can even grow new skin because anything attaching is attaching to the hypergranulation which is not even get a be anywhere close to sufficient for good epithelial growth. He also has had a CT scan in April which showed there was post esophagectomy with gastric pull-up anatomy but no evidence of local recurrence. There is no evidence of mediastinal nodal metastasis. He did have small bilateral pulmonary nodules which were unchanged recommend routine surveillance. Otherwise the patient has a ventral hernia following the surgery but no evidence of obstruction or gangrene. 06/28/2021 upon evaluation today patient's wound actually appears to be doing quite a bit better and it is hypergranular tissue is significantly improved compared to last time. With that being said they had a hard time getting the [facility] I am not sure exactly what all happened but the good news is that is something that should be on the way and he should have that if not today then tomorrow. Overall I think that with the [facility] this would have looked even better but nonetheless I am still pleased with what I am seeing today. 07/06/2021  upon evaluation today patient's wound is actually showing signs of improvement and in general  I feel like he is making excellent progress. The Hydrofera Blue dressing to be doing excellent for him it does cause a little bit of bleeding when he removes it during dressing changes but other than that he is doing quite well based on what I am seeing. Fortunately there does not appear to be any signs of active infection at this time. No fevers, chills, nausea, vomiting, or diarrhea. 07/13/2021 upon evaluation today patient appears to be doing well with regard to his abdominal ulcer. Overall I am extremely pleased with where things stand I think he is making good progress. I do think the Hydrofera Blue is doing a great job as far as treating this is concerned. I do believe that as long as he is seeing improvement we should continue as such. We may be able to contemplate some other options such as skin substitutes but right now when see how we do with this. 08/03/2021 upon evaluation today patient appears to be doing well with regard to his abdominal ulcer. Week by week this is showing signs of improvement which is good news. Overall the surface of the wound is also greatly improved. I am extremely pleased with where we stand today. Electronic Signature(s) Signed: 08/03/2021 3:35:11 PM By: Worthy Keeler PA-C Entered By: Worthy Keeler on 08/03/2021 15:35:11 -------------------------------------------------------------------------------- Chemical Cauterization Details Patient Name: Date of Service: [name] 08/03/2021 2:00 PM Medical Record Number: [id number] Patient Account Number: [id number] Date of Birth/Sex: Treating RN: 26-Feb-1966 (55 y.o. [name] Primary Care Provider: [name] Other Clinician: Donavan Burnet Referring Provider: Treating Provider/Extender: [name] in Treatment: 6 Procedure Performed for: Wound #1 Abdomen - midline Performed By: Physician Worthy Keeler, PA Post Procedure Diagnosis Same as  Pre-procedure Notes using 2 silver nitrate sticks Electronic Signature(s) Signed: 08/03/2021 5:28:57 PM By: Worthy Keeler PA-C Signed: 08/03/2021 6:35:12 PM By: Baruch Gouty RN, BSN Entered By: Baruch Gouty on 08/03/2021 15:33:12 -------------------------------------------------------------------------------- Physical Exam Details Patient Name: Date of Service: Salli Real K. 08/03/2021 2:00 PM Medical Record Number: [id number] Patient Account Number: [id number] Date of Birth/Sex: Treating RN: September 26, 1966 (55 y.o. [name] Primary Care Provider: [name] Other Clinician: Donavan Burnet Referring Provider: Treating Provider/Extender: [name] in Treatment: 6 Constitutional Well-nourished and well-hydrated in no acute distress. Respiratory normal breathing without difficulty. Psychiatric this patient is able to make decisions and demonstrates good insight into disease process. Alert and Oriented x 3. pleasant and cooperative. Notes Patient's wound bed showed signs of good granulation epithelization at this point. Fortunately there does not appear to be any evidence of active infection which is great news and overall I feel like the patient is making all some progress here. We did perform fluorescence imaging today with the MolecuLight DX the good news is there really did not appear to be any evidence of abnormal fluorescence noted at this point. Electronic Signature(s) Signed: 08/03/2021 3:35:45 PM By: Worthy Keeler PA-C Entered By: Worthy Keeler on 08/03/2021 15:35:45 -------------------------------------------------------------------------------- Physician Orders Details Patient Name: Date of Service: [name] 08/03/2021 2:00 PM Medical Record Number: [id number] Patient Account Number: [id number] Date of Birth/Sex: Treating RN: 19-Nov-1966 (55 y.o. [name] Primary Care Provider: [name] Other Clinician: Donavan Burnet Referring Provider: Treating Provider/Extender: [name] in Treatment: 6 Verbal /  Phone Orders: No Diagnosis Coding ICD-10 Coding Code Description T81.31XA Disruption of external operation (surgical) wound, not elsewhere classified, initial encounter L98.492 Non-pressure chronic ulcer of skin of other sites with fat layer exposed Z85.01 Personal history of malignant neoplasm of esophagus K43.9 Ventral hernia without obstruction or gangrene Follow-up Appointments Return appointment in 3 weeks. - with [facility] Shower/ Hygiene May shower and wash wound with soap and water. Additional Orders / Instructions Follow Nutritious Diet - Increase protein Wound Treatment Wound #1 - Abdomen - midline Cleanser: Soap and Water Every Other Day/Other:90 days Discharge Instructions: May shower and wash wound with dial antibacterial soap and water prior to dressing change. Cleanser: Wound Cleanser Every Other Day/Other:90 days Discharge Instructions: Cleanse the wound with wound cleanser prior to applying a clean dressing using gauze sponges, not tissue or cotton balls. Prim Dressing: Hydrofera Blue Ready Foam, 4x5 in Every Other Day/Other:90 days ary Discharge Instructions: Apply to wound bed as instructed Secondary Dressing: ABD Pad, 8x10 Every Other Day/Other:90 days Discharge Instructions: Apply over primary dressing as directed. Secured With: 60M Medipore H Soft Cloth Surgical T 4 x 2 (in/yd) Every Other Day/Other:90 days ape Discharge Instructions: Secure dressing with tape as directed. Electronic Signature(s) Signed: 08/03/2021 5:28:57 PM By: Worthy Keeler PA-C Signed: 08/03/2021 6:35:12 PM By: Baruch Gouty RN, BSN Entered By: Baruch Gouty on 08/03/2021 15:33:45 -------------------------------------------------------------------------------- Problem List Details Patient Name: Date of Service: [name] 08/03/2021 2:00 PM Medical Record Number: [id number] Patient Account Number: [id number] Date of Birth/Sex: Treating RN: 1966/03/02 (55 y.o. [name] Primary Care Provider: [name] Other Clinician: Donavan Burnet Referring Provider: Treating Provider/Extender: [name] in Treatment: 6 Active Problems ICD-10 Encounter Code Description Active Date MDM Diagnosis T81.31XA Disruption of external operation (surgical) wound, not elsewhere classified, 06/22/2021 No Yes initial encounter L98.492 Non-pressure chronic ulcer of skin of other sites with fat layer exposed 06/22/2021 No Yes Z85.01 Personal history of malignant neoplasm of esophagus 06/22/2021 No Yes K43.9 Ventral hernia without obstruction or gangrene 06/22/2021 No Yes Inactive Problems Resolved Problems Electronic Signature(s) Signed: 08/03/2021 3:25:39 PM By: Worthy Keeler PA-C Entered By: Worthy Keeler on 08/03/2021 15:25:39 -------------------------------------------------------------------------------- Progress Note Details Patient Name: Date of Service: [name] 08/03/2021 2:00 PM Medical Record Number: [id number] Patient Account Number: [id number] Date of Birth/Sex: Treating RN: 11-30-1965 (55 y.o. [name] Primary Care Provider: [name] Other Clinician: Donavan Burnet Referring Provider: Treating Provider/Extender: [name] in Treatment: 6 Subjective Chief Complaint Information obtained from Patient Surgical Ulcer History of Present Illness (HPI) 06/22/2021 patient presents today as a pleasant gentleman who unfortunately has been having quite a bit of an issue with his abdominal area following surgery in 2021 November. 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 Subsequently he also was on immunotherapy following to try to help with any residual carcinoma following the  procedure. Nonetheless he had an issue with the incision site in the mid abdomen and unfortunately this ended up dehiscing and has had trouble since getting this healed. He has been seen by Dr. [name] him where ACell was utilized but again there is really no response she referred the patient to [place] for further evaluation and treatment. No chemical cauterization with silver nitrate has been utilized. He also has not had any he tells me aggressive sharp debridement of the area to clear away some of this hypergranular tissue which he has  significant amounts noted. Either way I think that we will get a need to try to see about removing this I do think, to send sample of this to biopsy just to make sure there is no signs of cancer noted although I really think that is probably not can be the case I think we will get a get a report back stating hypergranulation tissue. With that being said the patient does seem to have quite a bit of bleeding and friability even with wiping over the wound area and again I think this is a big part of the issue again he can even grow new skin because anything attaching is attaching to the hypergranulation which is not even get a be anywhere close to sufficient for good epithelial growth. He also has had a CT scan in April which showed there was post esophagectomy with gastric pull-up anatomy but no evidence of local recurrence. There is no evidence of mediastinal nodal metastasis. He did have small bilateral pulmonary nodules which were unchanged recommend routine surveillance. Otherwise the patient has a ventral hernia following the surgery but no evidence of obstruction or gangrene. 06/28/2021 upon evaluation today patient's wound actually appears to be doing quite a bit better and it is hypergranular tissue is significantly improved compared to last time. With that being said they had a hard time getting the [facility] I am not sure exactly what all happened but the good  news is that is something that should be on the way and he should have that if not today then tomorrow. Overall I think that with the [facility] this would have looked even better but nonetheless I am still pleased with what I am seeing today. 07/06/2021 upon evaluation today patient's wound is actually showing signs of improvement and in general I feel like he is making excellent progress. The Hydrofera Blue dressing to be doing excellent for him it does cause a little bit of bleeding when he removes it during dressing changes but other than that he is doing quite well based on what I am seeing. Fortunately there does not appear to be any signs of active infection at this time. No fevers, chills, nausea, vomiting, or diarrhea. 07/13/2021 upon evaluation today patient appears to be doing well with regard to his abdominal ulcer. Overall I am extremely pleased with where things stand I think he is making good progress. I do think the Hydrofera Blue is doing a great job as far as treating this is concerned. I do believe that as long as he is seeing improvement we should continue as such. We may be able to contemplate some other options such as skin substitutes but right now when see how we do with this. 08/03/2021 upon evaluation today patient appears to be doing well with regard to his abdominal ulcer. Week by week this is showing signs of improvement which is good news. Overall the surface of the wound is also greatly improved. I am extremely pleased with where we stand today. Objective Constitutional Well-nourished and well-hydrated in no acute distress. Vitals Time Taken: 2:28 PM, Height: 69 in, Weight: 191 lbs, BMI: 28.2, Temperature: 98.8 F, Pulse: 81 bpm, Respiratory Rate: 16 breaths/min, Blood Pressure: 122/79 mmHg. Respiratory normal breathing without difficulty. Psychiatric this patient is able to make decisions and demonstrates good insight into disease process. Alert and Oriented x  3. pleasant and cooperative. General Notes: Patient's wound bed showed signs of good granulation epithelization at this point. Fortunately there does not appear  to be any evidence of active infection which is great news and overall I feel like the patient is making all some progress here. We did perform fluorescence imaging today with the MolecuLight DX the good news is there really did not appear to be any evidence of abnormal fluorescence noted at this point. Integumentary (Hair, Skin) Wound #1 status is Open. Original cause of wound was Surgical Injury. The date acquired was: 09/30/2020. The wound has been in treatment 6 weeks. The wound is located on the Abdomen - midline. The wound measures 7.6cm length x 6.9cm width x 0.1cm depth; 41.186cm^2 area and 4.119cm^3 volume. There is Fat Layer (Subcutaneous Tissue) exposed. There is a medium amount of serosanguineous drainage noted. The wound margin is distinct with the outline attached to the wound base. There is large (67-100%) red, hyper - granulation within the wound bed. There is no necrotic tissue within the wound bed. Assessment Active Problems ICD-10 Disruption of external operation (surgical) wound, not elsewhere classified, initial encounter Non-pressure chronic ulcer of skin of other sites with fat layer exposed Personal history of malignant neoplasm of esophagus Ventral hernia without obstruction or gangrene Procedures Wound #1 Pre-procedure diagnosis of Wound #1 is an Open Surgical Wound located on the Abdomen - midline . An Chemical Cauterization procedure was performed by Worthy Keeler, PA. Post procedure Diagnosis Wound #1: Same as Pre-Procedure Notes: using 2 silver nitrate sticks Plan Follow-up Appointments: Return appointment in 3 weeks. - with [facility] Shower/ Hygiene: May shower and wash wound with soap and water. Additional Orders / Instructions: Follow Nutritious Diet - Increase protein WOUND #1: - Abdomen -  midline Wound Laterality: Cleanser: Soap and Water Every Other Day/Other:90 days Discharge Instructions: May shower and wash wound with dial antibacterial soap and water prior to dressing change. Cleanser: Wound Cleanser Every Other Day/Other:90 days Discharge Instructions: Cleanse the wound with wound cleanser prior to applying a clean dressing using gauze sponges, not tissue or cotton balls. Prim Dressing: Hydrofera Blue Ready Foam, 4x5 in Every Other Day/Other:90 days ary Discharge Instructions: Apply to wound bed as instructed Secondary Dressing: ABD Pad, 8x10 Every Other Day/Other:90 days Discharge Instructions: Apply over primary dressing as directed. Secured With: 50M Medipore H Soft Cloth Surgical T 4 x 2 (in/yd) Every Other Day/Other:90 days ape Discharge Instructions: Secure dressing with tape as directed. 1. Would recommend currently that we going continue with the wound care measures as before and the patient is in agreement with the plan this includes the use of the silver nitrate which was performed today. Also did recommend that we continue with the [facility]. 2. Also can suggest that we have the patient continue to wash this with Dial antibacterial soap and water in the shower I think this is appropriate as well and will help keep the bacterial load down. We will see patient back for reevaluation in 1 week here in the clinic. If anything worsens or changes patient will contact our office for additional recommendations. MolecuLight DX: 1st Scanned Wound The following wound was scanned with MolecuLight DX): Midline abdominal ulcer Fluorescence bacterial imaging was medically necessary today due to Initial Evaluation of the wound with MolecuLightDX to determine baseline (Indication): bacterial bioburden level Green Colors, There was no fluorescence suggestive of a high bacterial load MolecuLight Results on todays scan. As a result of todays scan, the following treatment  plans were put in place. No change in current therapy due to negative result MolecuLight Procedure The MolecularLight DX device was cleaned  with a disinfectant wipe prior to use., The correct patient profile was confirmed and correct wound was verified., Range finder sensor used to ensure appropriate distance selected The following was completed: between imaging unit and wound bed, Room lights were turned off and the ambient light sensor was checked., Blue circle appeared around the lightbulb., The fluorescence icon was selected. Screen was tapped to enhance focus and the image was captured. Additional drapes were used to ensure adequate darknesso No Additional Scanned Wounds Did you scan any additional Woundso No Electronic Signature(s) Signed: 08/03/2021 3:36:45 PM By: Worthy Keeler PA-C Entered By: Worthy Keeler on 08/03/2021 15:36:44 -------------------------------------------------------------------------------- SuperBill Details Patient Name: Date of Service: [name] 08/03/2021 Medical Record Number: [id number] Patient Account Number: [id number] Date of Birth/Sex: Treating RN: 1966-05-21 (55 y.o. [name] Primary Care Provider: [name] Other Clinician: Donavan Burnet Referring Provider: Treating Provider/Extender: [name] in Treatment: 6 Diagnosis Coding ICD-10 Codes Code Description T81.31XA Disruption of external operation (surgical) wound, not elsewhere classified, initial encounter L98.492 Non-pressure chronic ulcer of skin of other sites with fat layer exposed Z85.01 Personal history of malignant neoplasm of esophagus K43.9 Ventral hernia without obstruction or gangrene Facility Procedures CPT4 Code: [id number] Description: [id number] - CHEM CAUT GRANULATION TISS ICD-10 Diagnosis Description L98.492 Non-pressure chronic ulcer of skin of other sites with fat layer exposed Modifier: Quantity: 1 CPT4 Code:  [id number] NO IC Description: NCNTACT RT FLORO WND 1ST STE CPT 0598T D-10 Diagnosis Description L98.492 Non-pressure chronic ulcer of skin of other sites with fat layer exposed Modifier: 1 Quantity: Physician Procedures : CPT4 Code Description Modifier [id number] - WC PHYS CHEM CAUT GRAN TISSUE ICD-10 Diagnosis Description L98.492 Non-pressure chronic ulcer of skin of other sites with fat layer exposed Quantity: 1 : 0598T NONCNTACT RT FLORO WND 1ST STE ICD-10 Diagnosis Description L98.492 Non-pressure chronic ulcer of skin of other sites with fat layer exposed Quantity: 1 Electronic Signature(s) Signed: 08/03/2021 3:37:02 PM By: Worthy Keeler PA-C Entered By: Worthy Keeler on 08/03/2021 15:37:01

## 2021-08-03 NOTE — Progress Notes (Signed)
ROWYN, SPILDE ([id number]) Visit Report for 08/03/2021 Arrival Information Details Patient Name: Date of Service: MA [name] 08/03/2021 2:00 PM Medical Record Number: [id number] Patient Account Number: [id number] Date of Birth/Sex: Treating RN: [birth year] (55 y.o. [name] Primary Care Alicen Donalson: [name] Other Clinician: Donavan Burnet Referring Axiel Fjeld: Treating Emori Mumme/Extender: [name] in Treatment: 6 Visit Information History Since Last Visit All ordered tests and consults were completed: Yes Patient Arrived: Ambulatory Added or deleted any medications: No Arrival Time: 14:25 Any new allergies or adverse reactions: No Accompanied By: spouse Had a fall or experienced change in No Transfer Assistance: None activities of daily living that may affect Patient Identification Verified: Yes risk of falls: Secondary Verification Process Completed: Yes Signs or symptoms of abuse/neglect since last visito No Patient Requires Transmission-Based Precautions: No Hospitalized since last visit: No Patient Has Alerts: No Implantable device outside of the clinic excluding No cellular tissue based products placed in the center since last visit: Pain Present Now: No Electronic Signature(s) Signed: 08/03/2021 6:20:23 PM By: Donavan Burnet EMT Entered By: Donavan Burnet on 08/03/2021 14:28:22 -------------------------------------------------------------------------------- Encounter Discharge Information Details Patient Name: Date of Service: [name] 08/03/2021 2:00 PM Medical Record Number: [id number] Patient Account Number: [id number] Date of Birth/Sex: Treating RN: [birth year] (55 y.o. [name] Primary Care Marsia Cino: [name] Other Clinician: Donavan Burnet Referring Sonia Bromell: Treating Nechelle Petrizzo/Extender: [name] in Treatment: 6 Encounter Discharge  Information Items Discharge Condition: Stable Ambulatory Status: Ambulatory Discharge Destination: Home Transportation: Private Auto Accompanied By: spouse Schedule Follow-up Appointment: Yes Clinical Summary of Care: Patient Declined Electronic Signature(s) Signed: 08/03/2021 6:35:12 PM By: Baruch Gouty RN, BSN Entered By: Baruch Gouty on 08/03/2021 18:19:52 -------------------------------------------------------------------------------- Lower Extremity Assessment Details Patient Name: Date of Service: [name] 08/03/2021 2:00 PM Medical Record Number: [id number] Patient Account Number: [id number] Date of Birth/Sex: Treating RN: 1966-07-17 (55 y.o. [name] Primary Care Aleisha Paone: [name] Other Clinician: Donavan Burnet Referring Kadesha Virrueta: Treating Eliazar Olivar/Extender: [name] in Treatment: 6 Electronic Signature(s) Signed: 08/03/2021 6:11:22 PM By: Deon Pilling RN, BSN Entered By: Deon Pilling on 08/03/2021 14:53:38 -------------------------------------------------------------------------------- Multi Wound Chart Details Patient Name: Date of Service: [name] 08/03/2021 2:00 PM Medical Record Number: [id number] Patient Account Number: [id number] Date of Birth/Sex: Treating RN: 12/22/1965 (55 y.o. [name] Primary Care Jedaiah Rathbun: [name] Other Clinician: Donavan Burnet Referring Durwin Davisson: Treating Giankarlo Leamer/Extender: [name] in Treatment: 6 Vital Signs Height(in): 69 Pulse(bpm): 81 Weight(lbs): 191 Blood Pressure(mmHg): 122/79 Body Mass Index(BMI): 28 Temperature(F): 98.8 Respiratory Rate(breaths/min): 16 Photos: [1:Abdomen - midline] [N/A:N/A N/A] Wound Location: [1:Surgical Injury] [N/A:N/A] Wounding Event: [1:Open Surgical Wound] [N/A:N/A] Primary Etiology: [1:Received Chemotherapy, Received N/A] Comorbid History: [1:Radiation  09/30/2020] [N/A:N/A] Date Acquired: [1:6] [N/A:N/A] Weeks of Treatment: [1:Open] [N/A:N/A] Wound Status: [1:7.6x6.9x0.1] [N/A:N/A] Measurements L x W x D (cm) [1:41.186] [N/A:N/A] A (cm) : rea [1:4.119] [N/A:N/A] Volume (cm) : [1:20.90%] [N/A:N/A] % Reduction in Area: [1:20.90%] [N/A:N/A] % Reduction in Volume: [1:Full Thickness Without Exposed] [N/A:N/A] Classification: [1:Support Structures Medium] [N/A:N/A] Exudate Amount: [1:Serosanguineous] [N/A:N/A] Exudate Type: [1:red, brown] [N/A:N/A] Exudate Color: [1:Distinct, outline attached] [N/A:N/A] Wound Margin: [1:Large (67-100%)] [N/A:N/A] Granulation Amount: [1:Red, Hyper-granulation] [N/A:N/A] Granulation Quality: [1:None Present (0%)] [N/A:N/A] Necrotic Amount: [1:Fat Layer (Subcutaneous Tissue): Yes N/A] Exposed Structures: [1:Fascia: No Tendon: No Muscle: No Joint: No Bone: No Small (1-33%)] [N/A:N/A] Epithelialization: [1:Chemical Cauterization] [N/A:N/A] Treatment Notes Electronic  Signature(s) Signed: 08/03/2021 6:35:12 PM By: Baruch Gouty RN, BSN Entered By: Baruch Gouty on 08/03/2021 17:50:08 -------------------------------------------------------------------------------- Multi-Disciplinary Care Plan Details Patient Name: Date of Service: [name] 08/03/2021 2:00 PM Medical Record Number: [id number] Patient Account Number: [id number] Date of Birth/Sex: Treating RN: [birth year] (55 y.o. [name] Primary Care Levita Monical: [name] Other Clinician: Donavan Burnet Referring Tyrrell Stephens: Treating Kabe Mckoy/Extender: [name] in Treatment: [place] reviewed with physician Active Inactive Wound/Skin Impairment Nursing Diagnoses: Impaired tissue integrity Knowledge deficit related to ulceration/compromised skin integrity Goals: Patient/caregiver will verbalize understanding of skin care regimen Date Initiated: 06/22/2021 Target Resolution  Date: 08/17/2021 Goal Status: Active Ulcer/skin breakdown will have a volume reduction of 30% by week 4 Date Initiated: 06/22/2021 Date Inactivated: 08/03/2021 Target Resolution Date: 07/20/2021 Goal Status: Met Ulcer/skin breakdown will have a volume reduction of 50% by week 8 Date Initiated: 08/03/2021 Target Resolution Date: 08/17/2021 Goal Status: Active Interventions: Assess patient/caregiver ability to obtain necessary supplies Assess patient/caregiver ability to perform ulcer/skin care regimen upon admission and as needed Assess ulceration(s) every visit Provide education on ulcer and skin care Treatment Activities: Skin care regimen initiated : 06/22/2021 Topical wound management initiated : 06/22/2021 Notes: Electronic Signature(s) Signed: 08/03/2021 6:35:12 PM By: Baruch Gouty RN, BSN Entered By: Baruch Gouty on 08/03/2021 15:31:18 -------------------------------------------------------------------------------- Pain Assessment Details Patient Name: Date of Service: Salli Real K. 08/03/2021 2:00 PM Medical Record Number: [id number] Patient Account Number: [id number] Date of Birth/Sex: Treating RN: 1966/07/31 (55 y.o. [name] Primary Care Micaiah Remillard: [name] Other Clinician: Donavan Burnet Referring Lakeita Panther: Treating Darrell Leonhardt/Extender: [name] in Treatment: 6 Active Problems Location of Pain Severity and Description of Pain Patient Has Paino No Site Locations Pain Management and Medication Current Pain Management: Electronic Signature(s) Signed: 08/03/2021 6:20:23 PM By: Donavan Burnet EMT Signed: 08/03/2021 6:35:12 PM By: Baruch Gouty RN, BSN Entered By: Donavan Burnet on 08/03/2021 14:30:32 -------------------------------------------------------------------------------- Patient/Caregiver Education Details Patient Name: Date of Service: MA [name] 9/14/2022andnbsp2:00 PM Medical Record  Number: [id number] Patient Account Number: [id number] Date of Birth/Gender: Treating RN: 1966/09/18 (55 y.o. [name] Primary Care Physician: [name] Other Clinician: Donavan Burnet Referring Physician: Treating Physician/Extender: [name] in Treatment: 6 Education Assessment Education Provided To: Patient Education Topics Provided Wound/Skin Impairment: Methods: Explain/Verbal Responses: Reinforcements needed, State content correctly Electronic Signature(s) Signed: 08/03/2021 6:35:12 PM By: Baruch Gouty RN, BSN Entered By: Baruch Gouty on 08/03/2021 15:32:36 -------------------------------------------------------------------------------- Wound Assessment Details Patient Name: Date of Service: [name] 08/03/2021 2:00 PM Medical Record Number: [id number] Patient Account Number: [id number] Date of Birth/Sex: Treating RN: 14-Jul-1966 (55 y.o. [name], [name] Primary Care Reshad Saab: [name] Other Clinician: Donavan Burnet Referring Justen Fonda: Treating Carey Johndrow/Extender: [name] in Treatment: 6 Wound Status Wound Number: 1 Primary Etiology: Open Surgical Wound Wound Location: Abdomen - midline Wound Status: Open Wounding Event: Surgical Injury Comorbid History: Received Chemotherapy, Received Radiation Date Acquired: 09/30/2020 Weeks Of Treatment: 6 Clustered Wound: No Photos Wound Measurements Length: (cm) 7.6 Width: (cm) 6.9 Depth: (cm) 0.1 Area: (cm) 41.186 Volume: (cm) 4.119 % Reduction in Area: 20.9% % Reduction in Volume: 20.9% Epithelialization: Small (1-33%) Wound Description Classification: Full Thickness Without Exposed Support Structures Wound Margin: Distinct, outline attached Exudate Amount: Medium Exudate Type: Serosanguineous Exudate Color: red, brown Foul Odor After Cleansing: No Slough/Fibrino No Wound Bed Granulation Amount: Large  (67-100%) Exposed Structure Granulation  Quality: Red, Hyper-granulation Fascia Exposed: No Necrotic Amount: None Present (0%) Fat Layer (Subcutaneous Tissue) Exposed: Yes Tendon Exposed: No Muscle Exposed: No Joint Exposed: No Bone Exposed: No Treatment Notes Wound #1 (Abdomen - midline) Cleanser Soap and Water Discharge Instruction: May shower and wash wound with dial antibacterial soap and water prior to dressing change. Wound Cleanser Discharge Instruction: Cleanse the wound with wound cleanser prior to applying a clean dressing using gauze sponges, not tissue or cotton balls. Peri-Wound Care Topical Primary Dressing Hydrofera Blue Ready Foam, 4x5 in Discharge Instruction: Apply to wound bed as instructed Secondary Dressing ABD Pad, 8x10 Discharge Instruction: Apply over primary dressing as directed. Secured With 75M Medipore H Soft Cloth Surgical T 4 x 2 (in/yd) ape Discharge Instruction: Secure dressing with tape as directed. Compression Wrap Compression Stockings Add-Ons Electronic Signature(s) Signed: 08/03/2021 6:11:22 PM By: Deon Pilling RN, BSN Entered By: Deon Pilling on 08/03/2021 15:03:31 -------------------------------------------------------------------------------- Vitals Details Patient Name: Date of Service: [name] 08/03/2021 2:00 PM Medical Record Number: [id number] Patient Account Number: [id number] Date of Birth/Sex: Treating RN: 07-May-1966 (55 y.o. [name] Primary Care Iam Lipson: [name] Other Clinician: Donavan Burnet Referring Baylin Cabal: Treating Seana Underwood/Extender: [name] in Treatment: 6 Vital Signs Time Taken: 14:28 Temperature (F): 98.8 Height (in): 69 Pulse (bpm): 81 Weight (lbs): 191 Respiratory Rate (breaths/min): 16 Body Mass Index (BMI): 28.2 Blood Pressure (mmHg): 122/79 Reference Range: 80 - 120 mg / dl Electronic Signature(s) Signed: 08/03/2021 6:20:23 PM By:  Donavan Burnet EMT Entered By: Donavan Burnet on 08/03/2021 14:30:24

## 2021-08-05 ENCOUNTER — Encounter: Payer: Self-pay | Admitting: Oncology

## 2021-08-09 NOTE — Progress Notes (Signed)
[facility]  [address] [place],  [place]  [address] [phone]  Clinic Day:  08/16/2021  Referring physician: Lillard Anes,*  This document serves as a record of services personally performed by [name], MD. It was created on their behalf by [facility], a trained medical scribe. The creation of this record is based on the scribe's personal observations and the provider's statements to them.  HISTORY OF PRESENT ILLNESS:  The patient is a 55 y.o. 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  He comes in today prior to an 11th cycle of maintenance nivolumab.  Since his last visit, the patient has been doing well.  He continues to tolerate his nivolumab immunotherapy very well without having any significant side effects.  He denies having any dysphagia, abdominal discomfort or other GI symptoms which concern him for early disease recurrence.     With respect to his esophageal cancer treatment history, he completed neoadjuvant chemoradiation, which consisted of 3 cycles of FOLFOX chemotherapy.  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  Pathology from this surgery showed persistent adenocarcinoma which extended into the muscularis propria level of his stomach.  Based upon this, he is undergoing adjuvant nivolumab immunotherapy for 1 year.   PHYSICAL EXAM:  Blood pressure 127/86, pulse 86, temperature 98.3 F (36.8 C), resp. rate 16, height 5\' 9"  (1.753 m), weight 193 lb 14.4 oz (88 kg), SpO2 96 %. Wt Readings from Last 3 Encounters:  08/16/21 193 lb 14.4 oz (88 kg)  07/29/21 192 lb (87.1 kg)  07/19/21 193 lb 1.6 oz (87.6 kg)   Body mass index is 28.63 kg/m. Performance status (ECOG): 1 - Symptomatic but completely ambulatory Physical Exam Constitutional:      General: He is not in acute distress.    Appearance: Normal appearance. He is normal weight.  HENT:      Head: Normocephalic and atraumatic.  Eyes:     General: No scleral icterus.    Extraocular Movements: Extraocular movements intact.     Conjunctiva/sclera: Conjunctivae normal.     Pupils: Pupils are equal, round, and reactive to light.  Cardiovascular:     Rate and Rhythm: Normal rate and regular rhythm.     Pulses: Normal pulses.     Heart sounds: Normal heart sounds. No murmur heard.   No friction rub. No gallop.  Pulmonary:     Effort: Pulmonary effort is normal. No respiratory distress.     Breath sounds: Normal breath sounds.  Abdominal:     General: Bowel sounds are normal. There is no distension.     Palpations: Abdomen is soft. There is no hepatomegaly, splenomegaly or mass.     Tenderness: There is no abdominal tenderness.  Musculoskeletal:        General: Normal range of motion.     Cervical back: Normal range of motion and neck supple.     Right lower leg: No edema.     Left lower leg: No edema.  Lymphadenopathy:     Cervical: No cervical adenopathy.  Skin:    General: Skin is warm and dry.  Neurological:     General: No focal deficit present.     Mental Status: He is alert and oriented to person, place, and time. Mental status is at baseline.  Psychiatric:        Mood and Affect: Mood normal.        Behavior:  Behavior normal.        Thought Content: Thought content normal.        Judgment: Judgment normal.    LABS:    Ref. Range 08/16/2021 00:00  Sodium Latest Ref Range: 137 - 147  136 (A)  Potassium Latest Ref Range: 3.4 - 5.3  5.0  Chloride Latest Ref Range: 99 - 108  104  CO2 Latest Ref Range: 13 - 22  22  Glucose Unknown 94  BUN Latest Ref Range: 4 - 21  20  Creatinine Latest Ref Range: 0.6 - 1.3  0.9  Calcium Latest Ref Range: 8.7 - 10.7  9.0  Alkaline Phosphatase Latest Ref Range: 25 - 125  88  Albumin Latest Ref Range: 3.5 - 5.0  4.2  AST Latest Ref Range: 14 - 40  27  ALT Latest Ref Range: 10 - 40  18  Bilirubin, Total Unknown 0.5  WBC Unknown  7.5  RBC Latest Ref Range: 3.87 - 5.11  4.91  Hemoglobin Latest Ref Range: 13.5 - 17.5  15.6  HCT Latest Ref Range: 41 - 53  46  MCV Latest Ref Range: 80 - 94  94  Platelets Latest Ref Range: 150 - 399  233  NEUT# Unknown 5.03   ASSESSMENT & PLAN:  Assessment/Plan:  A 55 y.o. 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 He will proceed with his 11th cycle of adjuvant nivolumab immunotherapy next week, which is being given at 480 mg once every 4 weeks.  Clinically, the patient appears to be doing very well.  I will see him back in 4 weeks before he heads into his 12th and final cycle of adjuvant nivolumab immunotherapy.  The patient understands all the plans discussed today and is in agreement with them.     I, Rita Ohara, am acting as scribe for [name], MD    I have reviewed this report as typed by the medical scribe, and it is complete and accurate.  Dequincy Macarthur Critchley, MD

## 2021-08-16 ENCOUNTER — Inpatient Hospital Stay (INDEPENDENT_AMBULATORY_CARE_PROVIDER_SITE_OTHER): Payer: 59 | Admitting: Oncology

## 2021-08-16 ENCOUNTER — Encounter: Payer: Self-pay | Admitting: Oncology

## 2021-08-16 ENCOUNTER — Inpatient Hospital Stay: Payer: 59

## 2021-08-16 ENCOUNTER — Other Ambulatory Visit: Payer: Self-pay | Admitting: Hematology and Oncology

## 2021-08-16 ENCOUNTER — Telehealth: Payer: Self-pay | Admitting: Oncology

## 2021-08-16 VITALS — BP 127/86 | HR 86 | Temp 98.3°F | Resp 16 | Ht 69.0 in | Wt 193.9 lb

## 2021-08-16 DIAGNOSIS — C159 Malignant neoplasm of esophagus, unspecified: Secondary | ICD-10-CM | POA: Diagnosis not present

## 2021-08-16 DIAGNOSIS — Z5112 Encounter for antineoplastic immunotherapy: Secondary | ICD-10-CM | POA: Diagnosis not present

## 2021-08-16 LAB — BASIC METABOLIC PANEL
BUN: 20 (ref 4–21)
CO2: 22 (ref 13–22)
Chloride: 104 (ref 99–108)
Creatinine: 0.9 (ref 0.6–1.3)
Glucose: 94
Potassium: 5 (ref 3.4–5.3)
Sodium: 136 — AB (ref 137–147)

## 2021-08-16 LAB — CBC AND DIFFERENTIAL
HCT: 46 (ref 41–53)
Hemoglobin: 15.6 (ref 13.5–17.5)
Neutrophils Absolute: 5.03
Platelets: 233 (ref 150–399)
WBC: 7.5

## 2021-08-16 LAB — CMP (CANCER CENTER ONLY)
ALT: 18 U/L (ref 0–44)
AST: 20 U/L (ref 15–41)
Albumin: 4 g/dL (ref 3.5–5.0)
Alkaline Phosphatase: 93 U/L (ref 38–126)
Anion gap: 6 (ref 5–15)
BUN: 22 mg/dL — ABNORMAL HIGH (ref 6–20)
CO2: 25 mmol/L (ref 22–32)
Calcium: 9.3 mg/dL (ref 8.9–10.3)
Chloride: 105 mmol/L (ref 98–111)
Creatinine: 0.93 mg/dL (ref 0.61–1.24)
GFR, Estimated: >60 mL/min (ref >60)
Glucose, Bld: 95 mg/dL (ref 70–99)
Potassium: 5 mmol/L (ref 3.5–5.1)
Sodium: 136 mmol/L (ref 135–145)
Total Bilirubin: 0.6 mg/dL (ref 0.3–1.2)
Total Protein: 6.8 g/dL (ref 6.5–8.1)

## 2021-08-16 LAB — TSH: TSH: 0.41 u[IU]/mL (ref 0.350–4.500)

## 2021-08-16 LAB — HEPATIC FUNCTION PANEL
ALT: 18 (ref 10–40)
AST: 27 (ref 14–40)
Alkaline Phosphatase: 88 (ref 25–125)
Bilirubin, Total: 0.5

## 2021-08-16 LAB — CBC
MCV: 94 (ref 80–94)
RBC: 4.91 (ref 3.87–5.11)

## 2021-08-16 LAB — COMPREHENSIVE METABOLIC PANEL
Albumin: 4.2 (ref 3.5–5.0)
Calcium: 9 (ref 8.7–10.7)

## 2021-08-16 NOTE — Telephone Encounter (Signed)
Per 9/27 LOS next appt scheduled and given to patient

## 2021-08-18 LAB — T4: T4, Total: 6.6 ug/dL (ref 4.5–12.0)

## 2021-08-23 ENCOUNTER — Inpatient Hospital Stay: Payer: 59 | Attending: Oncology

## 2021-08-23 ENCOUNTER — Other Ambulatory Visit: Payer: Self-pay | Admitting: Hematology and Oncology

## 2021-08-23 DIAGNOSIS — Z5112 Encounter for antineoplastic immunotherapy: Secondary | ICD-10-CM | POA: Diagnosis not present

## 2021-08-23 DIAGNOSIS — C160 Malignant neoplasm of cardia: Secondary | ICD-10-CM | POA: Diagnosis present

## 2021-08-23 DIAGNOSIS — C159 Malignant neoplasm of esophagus, unspecified: Secondary | ICD-10-CM

## 2021-08-23 LAB — TSH: TSH: 0.465 u[IU]/mL (ref 0.350–4.500)

## 2021-08-23 LAB — BASIC METABOLIC PANEL
BUN: 20 (ref 4–21)
CO2: 28 — AB (ref 13–22)
Chloride: 104 (ref 99–108)
Creatinine: 0.8 (ref 0.6–1.3)
Glucose: 126
Potassium: 4.3 (ref 3.4–5.3)
Sodium: 137 (ref 137–147)

## 2021-08-23 LAB — CBC AND DIFFERENTIAL
HCT: 44 (ref 41–53)
Hemoglobin: 15.2 (ref 13.5–17.5)
Neutrophils Absolute: 3.78
Platelets: 215 (ref 150–399)
WBC: 6.1

## 2021-08-23 LAB — HEPATIC FUNCTION PANEL
ALT: 24 (ref 10–40)
AST: 32 (ref 14–40)
Alkaline Phosphatase: 91 (ref 25–125)
Bilirubin, Total: 0.6

## 2021-08-23 LAB — CBC
MCV: 93 (ref 80–94)
RBC: 4.78 (ref 3.87–5.11)

## 2021-08-23 LAB — COMPREHENSIVE METABOLIC PANEL
Albumin: 4 (ref 3.5–5.0)
Calcium: 9.1 (ref 8.7–10.7)

## 2021-08-24 ENCOUNTER — Other Ambulatory Visit: Payer: Self-pay

## 2021-08-24 ENCOUNTER — Encounter (HOSPITAL_BASED_OUTPATIENT_CLINIC_OR_DEPARTMENT_OTHER): Payer: 59 | Attending: Physician Assistant | Admitting: Physician Assistant

## 2021-08-24 DIAGNOSIS — Z8501 Personal history of malignant neoplasm of esophagus: Secondary | ICD-10-CM | POA: Diagnosis not present

## 2021-08-24 DIAGNOSIS — Z8249 Family history of ischemic heart disease and other diseases of the circulatory system: Secondary | ICD-10-CM | POA: Insufficient documentation

## 2021-08-24 DIAGNOSIS — Z87891 Personal history of nicotine dependence: Secondary | ICD-10-CM | POA: Diagnosis not present

## 2021-08-24 DIAGNOSIS — L98492 Non-pressure chronic ulcer of skin of other sites with fat layer exposed: Secondary | ICD-10-CM | POA: Diagnosis not present

## 2021-08-24 NOTE — Progress Notes (Addendum)
Thomas Gonzalez, SCADDEN ([id number]) Visit Report for 08/24/2021 Chief Complaint Document Details Patient Name: Date of Service: MA [name] 08/24/2021 2:30 PM Medical Record Number: [id number] Patient Account Number: [id number] Date of Birth/Sex: Treating RN: 02/16/1966 (55 y.o. [name] Primary Care Provider: [name] Other Clinician: Referring Provider: Treating Provider/Extender: [name] in Treatment: 9 Information Obtained from: Patient Chief Complaint Surgical Ulcer Electronic Signature(s) Signed: 08/24/2021 2:24:41 PM By: Worthy Keeler PA-C Entered By: Worthy Keeler on 08/24/2021 14:24:41 -------------------------------------------------------------------------------- HPI Details Patient Name: Date of Service: MA [name]. 08/24/2021 2:30 PM Medical Record Number: [id number] Patient Account Number: [id number] Date of Birth/Sex: Treating RN: March 11, 1966 (55 y.o. [name] Primary Care Provider: [name] Other Clinician: Referring Provider: Treating Provider/Extender: [name] in Treatment: 9 History of Present Illness HPI Description: 06/22/2021 patient presents today as a pleasant gentleman who unfortunately has been having quite a bit of an issue with his abdominal area following surgery in 2021 November. 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 Subsequently he also was on immunotherapy following to try to help with any residual carcinoma following the procedure. Nonetheless he had an issue with the incision site in the mid abdomen and unfortunately this ended up dehiscing and has had trouble since getting this healed. He has been seen by Dr. [name] him where ACell was utilized but again there is really no response she referred the patient to [place] for further evaluation and treatment. No chemical cauterization with silver nitrate has  been utilized. He also has not had any he tells me aggressive sharp debridement of the area to clear away some of this hypergranular tissue which he has significant amounts noted. Either way I think that we will get a need to try to see about removing this I do think, to send sample of this to biopsy just to make sure there is no signs of cancer noted although I really think that is probably not can be the case I think we will get a get a report back stating hypergranulation tissue. With that being said the patient does seem to have quite a bit of bleeding and friability even with wiping over the wound area and again I think this is a big part of the issue again he can even grow new skin because anything attaching is attaching to the hypergranulation which is not even get a be anywhere close to sufficient for good epithelial growth. He also has had a CT scan in April which showed there was post esophagectomy with gastric pull-up anatomy but no evidence of local recurrence. There is no evidence of mediastinal nodal metastasis. He did have small bilateral pulmonary nodules which were unchanged recommend routine surveillance. Otherwise the patient has a ventral hernia following the surgery but no evidence of obstruction or gangrene. 06/28/2021 upon evaluation today patient's wound actually appears to be doing quite a bit better and it is hypergranular tissue is significantly improved compared to last time. With that being said they had a hard time getting the [facility] I am not sure exactly what all happened but the good news is that is something that should be on the way and he should have that if not today then tomorrow. Overall I think that with the [facility] this would have looked even better but nonetheless I am still pleased with what I am seeing today. 07/06/2021 upon evaluation today patient's  wound is actually showing signs of improvement and in general I feel like he is making excellent  progress. The Hydrofera Blue dressing to be doing excellent for him it does cause a little bit of bleeding when he removes it during dressing changes but other than that he is doing quite well based on what I am seeing. Fortunately there does not appear to be any signs of active infection at this time. No fevers, chills, nausea, vomiting, or diarrhea. 07/13/2021 upon evaluation today patient appears to be doing well with regard to his abdominal ulcer. Overall I am extremely pleased with where things stand I think he is making good progress. I do think the Hydrofera Blue is doing a great job as far as treating this is concerned. I do believe that as long as he is seeing improvement we should continue as such. We may be able to contemplate some other options such as skin substitutes but right now when see how we do with this. 08/03/2021 upon evaluation today patient appears to be doing well with regard to his abdominal ulcer. Week by week this is showing signs of improvement which is good news. Overall the surface of the wound is also greatly improved. I am extremely pleased with where we stand today. 08/24/2021 upon evaluation today patient's wound on the abdominal area appears to be doing excellent today. Fortunately there is no signs of active infection and very pleased in that regard. There does not appear to be any signs of active infection systemically either which is also good news. No fevers, chills, nausea, vomiting, or diarrhea. Upon inspection patient's wound bed showed signs of good granulation epithelization at this point. Fortunately there does not appear to be any signs of active infection at this time which is great news. Electronic Signature(s) Signed: 08/24/2021 3:25:39 PM By: Worthy Keeler PA-C Entered By: Worthy Keeler on 08/24/2021 15:25:39 -------------------------------------------------------------------------------- Chemical Cauterization Details Patient Name: Date of  Service: MA [name]. 08/24/2021 2:30 PM Medical Record Number: [id number] Patient Account Number: [id number] Date of Birth/Sex: Treating RN: October 07, 1966 (55 y.o. [name] Primary Care Provider: [name] Other Clinician: Referring Provider: Treating Provider/Extender: [name] in Treatment: 9 Procedure Performed for: Wound #1 Abdomen - midline Performed By: Physician Worthy Keeler, PA Post Procedure Diagnosis Same as Pre-procedure Notes silver nitrate to wound bed and edge. Electronic Signature(s) Signed: 08/25/2021 6:25:33 PM By: Deon Pilling RN, BSN Signed: 08/26/2021 9:30:20 AM By: Worthy Keeler PA-C Entered By: Deon Pilling on 08/24/2021 15:20:39 -------------------------------------------------------------------------------- Physical Exam Details Patient Name: Date of Service: Gentry Roch 08/24/2021 2:30 PM Medical Record Number: [id number] Patient Account Number: [id number] Date of Birth/Sex: Treating RN: Mar 21, 1966 (55 y.o. [name] Primary Care Provider: [name] Other Clinician: Referring Provider: Treating Provider/Extender: [name] in Treatment: 9 Constitutional Well-nourished and well-hydrated in no acute distress. Respiratory normal breathing without difficulty. Psychiatric this patient is able to make decisions and demonstrates good insight into disease process. Alert and Oriented x 3. pleasant and cooperative. Notes Upon inspection patient's wound bed actually showed signs of good granulation epithelization at this point. Fortunately there does not appear to be any evidence of active infection which is great news. I did perform chemical cauterization with silver nitrate today to keep this from being too hypergranular and hopefully improving the overall epithelial rate. He is done well up to this point I am hopeful this will continue to  be the  case. Electronic Signature(s) Signed: 08/24/2021 3:26:04 PM By: Worthy Keeler PA-C Entered By: Worthy Keeler on 08/24/2021 15:26:04 -------------------------------------------------------------------------------- Physician Orders Details Patient Name: Date of Service: MA [name]. 08/24/2021 2:30 PM Medical Record Number: [id number] Patient Account Number: [id number] Date of Birth/Sex: Treating RN: Mar 22, 1966 (55 y.o. [name] Primary Care Provider: [name] Other Clinician: Referring Provider: Treating Provider/Extender: [name] in Treatment: 9 Verbal / Phone Orders: No Diagnosis Coding ICD-10 Coding Code Description T81.31XA Disruption of external operation (surgical) wound, not elsewhere classified, initial encounter L98.492 Non-pressure chronic ulcer of skin of other sites with fat layer exposed Z85.01 Personal history of malignant neoplasm of esophagus K43.9 Ventral hernia without obstruction or gangrene Follow-up Appointments Return appointment in 3 weeks. - with [facility] Shower/ Hygiene May shower and wash wound with soap and water. Additional Orders / Instructions Follow Nutritious Diet - Increase protein Wound Treatment Wound #1 - Abdomen - midline Cleanser: Soap and Water Every Other Day/Other:90 days Discharge Instructions: May shower and wash wound with dial antibacterial soap and water prior to dressing change. Cleanser: Wound Cleanser Every Other Day/Other:90 days Discharge Instructions: Cleanse the wound with wound cleanser prior to applying a clean dressing using gauze sponges, not tissue or cotton balls. Prim Dressing: Hydrofera Blue Ready Foam, 4x5 in Every Other Day/Other:90 days ary Discharge Instructions: Apply to wound bed as instructed Secondary Dressing: ABD Pad, 8x10 Every Other Day/Other:90 days Discharge Instructions: Apply over primary dressing as directed. Secured With: 47M Medipore H  Soft Cloth Surgical T 4 x 2 (in/yd) Every Other Day/Other:90 days ape Discharge Instructions: Secure dressing with tape as directed. Electronic Signature(s) Signed: 08/25/2021 6:25:33 PM By: Deon Pilling RN, BSN Signed: 08/26/2021 9:30:20 AM By: Worthy Keeler PA-C Entered By: Deon Pilling on 08/24/2021 15:21:35 -------------------------------------------------------------------------------- Problem List Details Patient Name: Date of Service: MA [name]. 08/24/2021 2:30 PM Medical Record Number: [id number] Patient Account Number: [id number] Date of Birth/Sex: Treating RN: [birth year] (55 y.o. [name] Primary Care Provider: [name] Other Clinician: Referring Provider: Treating Provider/Extender: [name] in Treatment: 9 Active Problems ICD-10 Encounter Code Description Active Date MDM Diagnosis T81.31XA Disruption of external operation (surgical) wound, not elsewhere classified, 06/22/2021 No Yes initial encounter L98.492 Non-pressure chronic ulcer of skin of other sites with fat layer exposed 06/22/2021 No Yes Z85.01 Personal history of malignant neoplasm of esophagus 06/22/2021 No Yes K43.9 Ventral hernia without obstruction or gangrene 06/22/2021 No Yes Inactive Problems Resolved Problems Electronic Signature(s) Signed: 08/24/2021 2:24:30 PM By: Worthy Keeler PA-C Entered By: Worthy Keeler on 08/24/2021 14:24:30 -------------------------------------------------------------------------------- Progress Note Details Patient Name: Date of Service: [name] 08/24/2021 2:30 PM Medical Record Number: [id number] Patient Account Number: [id number] Date of Birth/Sex: Treating RN: [birth year] (55 y.o. [name] Primary Care Provider: [name] Other Clinician: Referring Provider: Treating Provider/Extender: [name] in Treatment: 9 Subjective Chief Complaint Information obtained  from Patient Surgical Ulcer History of Present Illness (HPI) 06/22/2021 patient presents today as a pleasant gentleman who unfortunately has been having quite a bit of an issue with his abdominal area following surgery in 2021 November. 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 Subsequently he also was on immunotherapy following to try to help with any residual carcinoma following the procedure. Nonetheless he had an issue with the incision site in the mid abdomen and unfortunately  this ended up dehiscing and has had trouble since getting this healed. He has been seen by Dr. [name] him where ACell was utilized but again there is really no response she referred the patient to [place] for further evaluation and treatment. No chemical cauterization with silver nitrate has been utilized. He also has not had any he tells me aggressive sharp debridement of the area to clear away some of this hypergranular tissue which he has significant amounts noted. Either way I think that we will get a need to try to see about removing this I do think, to send sample of this to biopsy just to make sure there is no signs of cancer noted although I really think that is probably not can be the case I think we will get a get a report back stating hypergranulation tissue. With that being said the patient does seem to have quite a bit of bleeding and friability even with wiping over the wound area and again I think this is a big part of the issue again he can even grow new skin because anything attaching is attaching to the hypergranulation which is not even get a be anywhere close to sufficient for good epithelial growth. He also has had a CT scan in April which showed there was post esophagectomy with gastric pull-up anatomy but no evidence of local recurrence. There is no evidence of mediastinal nodal metastasis. He did have small bilateral pulmonary nodules which were unchanged recommend routine  surveillance. Otherwise the patient has a ventral hernia following the surgery but no evidence of obstruction or gangrene. 06/28/2021 upon evaluation today patient's wound actually appears to be doing quite a bit better and it is hypergranular tissue is significantly improved compared to last time. With that being said they had a hard time getting the [facility] I am not sure exactly what all happened but the good news is that is something that should be on the way and he should have that if not today then tomorrow. Overall I think that with the [facility] this would have looked even better but nonetheless I am still pleased with what I am seeing today. 07/06/2021 upon evaluation today patient's wound is actually showing signs of improvement and in general I feel like he is making excellent progress. The Hydrofera Blue dressing to be doing excellent for him it does cause a little bit of bleeding when he removes it during dressing changes but other than that he is doing quite well based on what I am seeing. Fortunately there does not appear to be any signs of active infection at this time. No fevers, chills, nausea, vomiting, or diarrhea. 07/13/2021 upon evaluation today patient appears to be doing well with regard to his abdominal ulcer. Overall I am extremely pleased with where things stand I think he is making good progress. I do think the Hydrofera Blue is doing a great job as far as treating this is concerned. I do believe that as long as he is seeing improvement we should continue as such. We may be able to contemplate some other options such as skin substitutes but right now when see how we do with this. 08/03/2021 upon evaluation today patient appears to be doing well with regard to his abdominal ulcer. Week by week this is showing signs of improvement which is good news. Overall the surface of the wound is also greatly improved. I am extremely pleased with where we stand today. 08/24/2021  upon evaluation today patient's wound  on the abdominal area appears to be doing excellent today. Fortunately there is no signs of active infection and very pleased in that regard. There does not appear to be any signs of active infection systemically either which is also good news. No fevers, chills, nausea, vomiting, or diarrhea. Upon inspection patient's wound bed showed signs of good granulation epithelization at this point. Fortunately there does not appear to be any signs of active infection at this time which is great news. Objective Constitutional Well-nourished and well-hydrated in no acute distress. Vitals Time Taken: 3:00 PM, Height: 69 in, Weight: 191 lbs, BMI: 28.2, Temperature: 98.1 F, Pulse: 83 bpm, Respiratory Rate: 20 breaths/min, Blood Pressure: 120/78 mmHg. Respiratory normal breathing without difficulty. Psychiatric this patient is able to make decisions and demonstrates good insight into disease process. Alert and Oriented x 3. pleasant and cooperative. General Notes: Upon inspection patient's wound bed actually showed signs of good granulation epithelization at this point. Fortunately there does not appear to be any evidence of active infection which is great news. I did perform chemical cauterization with silver nitrate today to keep this from being too hypergranular and hopefully improving the overall epithelial rate. He is done well up to this point I am hopeful this will continue to be the case. Integumentary (Hair, Skin) Wound #1 status is Open. Original cause of wound was Surgical Injury. The date acquired was: 09/30/2020. The wound has been in treatment 9 weeks. The wound is located on the Abdomen - midline. The wound measures 7.7cm length x 6cm width x 0.1cm depth; 36.285cm^2 area and 3.629cm^3 volume. There is Fat Layer (Subcutaneous Tissue) exposed. There is no tunneling or undermining noted. There is a medium amount of serosanguineous drainage noted. The wound  margin is distinct with the outline attached to the wound base. There is large (67-100%) red, hyper - granulation within the wound bed. There is no necrotic tissue within the wound bed. Assessment Active Problems ICD-10 Disruption of external operation (surgical) wound, not elsewhere classified, initial encounter Non-pressure chronic ulcer of skin of other sites with fat layer exposed Personal history of malignant neoplasm of esophagus Ventral hernia without obstruction or gangrene Procedures Wound #1 Pre-procedure diagnosis of Wound #1 is an Open Surgical Wound located on the Abdomen - midline . An Chemical Cauterization procedure was performed by Worthy Keeler, PA. Post procedure Diagnosis Wound #1: Same as Pre-Procedure Notes: silver nitrate to wound bed and edge. Plan Follow-up Appointments: Return appointment in 3 weeks. - with [facility] Shower/ Hygiene: May shower and wash wound with soap and water. Additional Orders / Instructions: Follow Nutritious Diet - Increase protein WOUND #1: - Abdomen - midline Wound Laterality: Cleanser: Soap and Water Every Other Day/Other:90 days Discharge Instructions: May shower and wash wound with dial antibacterial soap and water prior to dressing change. Cleanser: Wound Cleanser Every Other Day/Other:90 days Discharge Instructions: Cleanse the wound with wound cleanser prior to applying a clean dressing using gauze sponges, not tissue or cotton balls. Prim Dressing: Hydrofera Blue Ready Foam, 4x5 in Every Other Day/Other:90 days ary Discharge Instructions: Apply to wound bed as instructed Secondary Dressing: ABD Pad, 8x10 Every Other Day/Other:90 days Discharge Instructions: Apply over primary dressing as directed. Secured With: 59M Medipore H Soft Cloth Surgical T 4 x 2 (in/yd) Every Other Day/Other:90 days ape Discharge Instructions: Secure dressing with tape as directed. 1. Would recommend that we continue with the [facility] as I  feel like that still doing excellent. 2. I am also  can recommend that we have the patient continue to change this dressing 3 times a week I think that is appropriate as far as the frequency is concerned. 3. We will also continue to monitor for any signs of infection if anything occurs such as increased pain, drainage, or redness around the edges of the wound he should let me know soon as possible. We will see patient back for reevaluation in 3 weeks here in the clinic. If anything worsens or changes patient will contact our office for additional recommendations. Electronic Signature(s) Signed: 08/24/2021 3:26:32 PM By: Worthy Keeler PA-C Entered By: Worthy Keeler on 08/24/2021 15:26:31 -------------------------------------------------------------------------------- SuperBill Details Patient Name: Date of Service: MA [name]. 08/24/2021 Medical Record Number: [id number] Patient Account Number: [id number] Date of Birth/Sex: Treating RN: Mar 23, 1966 (55 y.o. [name] Primary Care Provider: [name] Other Clinician: Referring Provider: Treating Provider/Extender: [name] in Treatment: 9 Diagnosis Coding ICD-10 Codes Code Description T81.31XA Disruption of external operation (surgical) wound, not elsewhere classified, initial encounter L98.492 Non-pressure chronic ulcer of skin of other sites with fat layer exposed Z85.01 Personal history of malignant neoplasm of esophagus K43.9 Ventral hernia without obstruction or gangrene Facility Procedures CPT4 Code: [id number] 1 Description: 8882 - CHEM CAUT GRANULATION TISS ICD-10 Diagnosis Description L98.492 Non-pressure chronic ulcer of skin of other sites with fat layer exposed Modifier: Quantity: 1 Physician Procedures Electronic Signature(s) Signed: 08/24/2021 3:26:49 PM By: Worthy Keeler PA-C Entered By: Worthy Keeler on 08/24/2021 15:26:49

## 2021-08-25 LAB — T4: T4, Total: 6.5 ug/dL (ref 4.5–12.0)

## 2021-08-25 MED FILL — Nivolumab IV Soln 100 MG/10ML: INTRAVENOUS | Qty: 48 | Status: AC

## 2021-08-25 NOTE — Progress Notes (Signed)
Thomas Gonzalez, Thomas Gonzalez ([id number]) Visit Report for 08/24/2021 Arrival Information Details Patient Name: Date of Service: Thomas Gonzalez 08/24/2021 2:30 PM Medical Record Number: [id number] Patient Account Number: [id number] Date of Birth/Sex: Treating RN: Jan 09, 1966 (55 y.o. Thomas Gonzalez, [name] Primary Care Chareese Sergent: [name] Other Clinician: Referring Birdell Frasier: Treating Kaylub Detienne/Extender: [name] in Treatment: 9 Visit Information History Since Last Visit Added or deleted any medications: No Patient Arrived: Ambulatory Any new allergies or adverse reactions: No Arrival Time: 14:59 Had a fall or experienced change in No Accompanied By: wife activities of daily living that may affect Transfer Assistance: None risk of falls: Patient Identification Verified: Yes Signs or symptoms of abuse/neglect since last visito No Secondary Verification Process Completed: Yes Hospitalized since last visit: No Patient Requires Transmission-Based Precautions: No Implantable device outside of the clinic excluding No Patient Has Alerts: No cellular tissue based products placed in the center since last visit: Has Dressing in Place as Prescribed: Yes Pain Present Now: No Electronic Signature(s) Signed: 08/25/2021 6:25:33 PM By: Deon Pilling RN, BSN Entered By: Deon Pilling on 08/24/2021 15:00:17 -------------------------------------------------------------------------------- Encounter Discharge Information Details Patient Name: Date of Service: Thomas Thomas Bering K. 08/24/2021 2:30 PM Medical Record Number: [id number] Patient Account Number: [id number] Date of Birth/Sex: Treating RN: 07/02/1966 (55 y.o. Thomas Gonzalez Primary Care Oluwasemilore Bahl: [name] Other Clinician: Referring Juli Odom: Treating Jamiracle Avants/Extender: [name] in Treatment: 9 Encounter Discharge Information Items Discharge Condition: Stable Ambulatory Status:  Ambulatory Discharge Destination: Home Transportation: Private Auto Accompanied By: wife Schedule Follow-up Appointment: Yes Clinical Summary of Care: Electronic Signature(s) Signed: 08/25/2021 6:25:33 PM By: Deon Pilling RN, BSN Entered By: Deon Pilling on 08/24/2021 15:22:19 -------------------------------------------------------------------------------- Lower Extremity Assessment Details Patient Name: Date of Service: Thomas Thomas Bering K. 08/24/2021 2:30 PM Medical Record Number: [id number] Patient Account Number: [id number] Date of Birth/Sex: Treating RN: 12/26/1965 (55 y.o. Thomas Gonzalez Primary Care Jaishaun Mcnab: [name] Other Clinician: Referring Rondia Higginbotham: Treating Daysi Boggan/Extender: [name] in Treatment: 9 Electronic Signature(s) Signed: 08/25/2021 6:25:33 PM By: Deon Pilling RN, BSN Entered By: Deon Pilling on 08/24/2021 15:02:54 -------------------------------------------------------------------------------- Multi-Disciplinary Care Plan Details Patient Name: Date of Service: Thomas Thomas Bering K. 08/24/2021 2:30 PM Medical Record Number: [id number] Patient Account Number: [id number] Date of Birth/Sex: Treating RN: 28-Mar-1966 (55 y.o. Thomas Gonzalez Primary Care Marquiz Sotelo: [name] Other Clinician: Referring Gaspare Netzel: Treating Wendolyn Raso/Extender: [name] in Treatment: [place] reviewed with physician Active Inactive Wound/Skin Impairment Nursing Diagnoses: Impaired tissue integrity Knowledge deficit related to ulceration/compromised skin integrity Goals: Patient/caregiver will verbalize understanding of skin care regimen Date Initiated: 06/22/2021 Target Resolution Date: 09/16/2021 Goal Status: Active Ulcer/skin breakdown will have a volume reduction of 30% by week 4 Date Initiated: 06/22/2021 Date Inactivated: 08/03/2021 Target Resolution Date: 07/20/2021 Goal Status:  Met Ulcer/skin breakdown will have a volume reduction of 50% by week 8 Date Initiated: 08/03/2021 Date Inactivated: 08/24/2021 Target Resolution Date: 08/17/2021 Unmet Reason: 30% both volume Goal Status: Unmet reduction. wound has made improvement see wound chart. Interventions: Assess patient/caregiver ability to obtain necessary supplies Assess patient/caregiver ability to perform ulcer/skin care regimen upon admission and as needed Assess ulceration(s) every visit Provide education on ulcer and skin care Treatment Activities: Skin care regimen initiated : 06/22/2021 Topical wound management initiated : 06/22/2021 Notes: Electronic Signature(s) Signed: 08/25/2021 6:25:33 PM By: Deon Pilling RN, BSN Entered By: Deon Pilling on 08/24/2021 15:20:13 -------------------------------------------------------------------------------- Pain Assessment  Details Patient Name: Date of Service: Thomas Gonzalez 08/24/2021 2:30 PM Medical Record Number: [id number] Patient Account Number: [id number] Date of Birth/Sex: Treating RN: 11/04/1966 (55 y.o. Thomas Gonzalez Primary Care Teller Wakefield: [name] Other Clinician: Referring Kortne All: Treating Karrisa Didio/Extender: [name] in Treatment: 9 Active Problems Location of Pain Severity and Description of Pain Patient Has Paino No Site Locations Rate the pain. Current Pain Level: 0 Pain Management and Medication Current Pain Management: Medication: No Cold Application: No Rest: No Massage: No Activity: No T.E.N.S.: No Heat Application: No Leg drop or elevation: No Is the Current Pain Management Adequate: Adequate How does your wound impact your activities of daily livingo Sleep: No Bathing: No Appetite: No Relationship With Others: No Bladder Continence: No Emotions: No Bowel Continence: No Work: No Toileting: No Drive: No Dressing: No Hobbies: No Engineer, maintenance) Signed: 08/25/2021  6:25:33 PM By: Deon Pilling RN, BSN Entered By: Deon Pilling on 08/24/2021 15:02:45 -------------------------------------------------------------------------------- Patient/Caregiver Education Details Patient Name: Date of Service: Thomas Gonzalez 10/5/2022andnbsp2:30 PM Medical Record Number: [id number] Patient Account Number: [id number] Date of Birth/Gender: Treating RN: February 14, 1966 (56 y.o. Thomas Gonzalez Primary Care Physician: [name] Other Clinician: Referring Physician: Treating Physician/Extender: [name] in Treatment: 9 Education Assessment Education Provided To: Patient Education Topics Provided Wound/Skin Impairment: Handouts: Skin Care Do's and Dont's Methods: Explain/Verbal Responses: Reinforcements needed Electronic Signature(s) Signed: 08/25/2021 6:25:33 PM By: Deon Pilling RN, BSN Entered By: Deon Pilling on 08/24/2021 15:20:54 -------------------------------------------------------------------------------- Wound Assessment Details Patient Name: Date of Service: Thomas Gonzalez. 08/24/2021 2:30 PM Medical Record Number: [id number] Patient Account Number: [id number] Date of Birth/Sex: Treating RN: May 18, 1966 (55 y.o. Thomas Gonzalez, [name] Primary Care Mckay Tegtmeyer: [name] Other Clinician: Referring Arius Harnois: Treating Wash Nienhaus/Extender: [name] in Treatment: 9 Wound Status Wound Number: 1 Primary Etiology: Open Surgical Wound Wound Location: Abdomen - midline Wound Status: Open Wounding Event: Surgical Injury Comorbid History: Received Chemotherapy, Received Radiation Date Acquired: 09/30/2020 Weeks Of Treatment: 9 Clustered Wound: No Photos Wound Measurements Length: (cm) 7.7 Width: (cm) 6 Depth: (cm) 0.1 Area: (cm) 36.285 Volume: (cm) 3.629 % Reduction in Area: 30.3% % Reduction in Volume: 30.3% Epithelialization: Small (1-33%) Tunneling: No Undermining:  No Wound Description Classification: Full Thickness Without Exposed Support Structur Wound Margin: Distinct, outline attached Exudate Amount: Medium Exudate Type: Serosanguineous Exudate Color: red, brown Wound Bed Granulation Amount: Large (67-100%) Granulation Quality: Red, Hyper-granulation Necrotic Amount: None Present (0%) es Foul Odor After Cleansing: No Slough/Fibrino No Exposed Structure Fascia Exposed: No Fat Layer (Subcutaneous Tissue) Exposed: Yes Tendon Exposed: No Muscle Exposed: No Joint Exposed: No Bone Exposed: No Treatment Notes Wound #1 (Abdomen - midline) Cleanser Soap and Water Discharge Instruction: May shower and wash wound with dial antibacterial soap and water prior to dressing change. Wound Cleanser Discharge Instruction: Cleanse the wound with wound cleanser prior to applying a clean dressing using gauze sponges, not tissue or cotton balls. Peri-Wound Care Topical Primary Dressing Hydrofera Blue Ready Foam, 4x5 in Discharge Instruction: Apply to wound bed as instructed Secondary Dressing ABD Pad, 8x10 Discharge Instruction: Apply over primary dressing as directed. Secured With 80M Medipore H Soft Cloth Surgical T 4 x 2 (in/yd) ape Discharge Instruction: Secure dressing with tape as directed. Compression Wrap Compression Stockings Add-Ons Electronic Signature(s) Signed: 08/25/2021 6:25:33 PM By: Deon Pilling RN, BSN Entered By: Deon Pilling on 08/24/2021 15:13:09 -------------------------------------------------------------------------------- Vitals Details Patient Name: Date of Service:  Thomas Thomas Bering K. 08/24/2021 2:30 PM Medical Record Number: [id number] Patient Account Number: [id number] Date of Birth/Sex: Treating RN: [birth year] (55 y.o. Thomas Gonzalez, [name] Primary Care Cheral Cappucci: [name] Other Clinician: Referring Kyriakos Babler: Treating Tamrah Victorino/Extender: [name] in Treatment: 9 Vital Signs Time  Taken: 15:00 Temperature (F): 98.1 Height (in): 69 Pulse (bpm): 83 Weight (lbs): 191 Respiratory Rate (breaths/min): 20 Body Mass Index (BMI): 28.2 Blood Pressure (mmHg): 120/78 Reference Range: 80 - 120 mg / dl Electronic Signature(s) Signed: 08/25/2021 6:25:33 PM By: Deon Pilling RN, BSN Entered By: Deon Pilling on 08/24/2021 15:02:02

## 2021-08-26 ENCOUNTER — Inpatient Hospital Stay: Payer: 59

## 2021-08-26 ENCOUNTER — Other Ambulatory Visit: Payer: Self-pay

## 2021-08-26 VITALS — BP 118/84 | HR 80 | Temp 99.0°F | Resp 18 | Ht 69.0 in | Wt 193.1 lb

## 2021-08-26 DIAGNOSIS — Z5112 Encounter for antineoplastic immunotherapy: Secondary | ICD-10-CM | POA: Diagnosis not present

## 2021-08-26 DIAGNOSIS — C159 Malignant neoplasm of esophagus, unspecified: Secondary | ICD-10-CM

## 2021-08-26 MED ORDER — SODIUM CHLORIDE 0.9% FLUSH
10.0000 mL | INTRAVENOUS | Status: DC | PRN
  Administered 2021-08-26: 10 mL

## 2021-08-26 MED ORDER — ALTEPLASE 2 MG IJ SOLR
2.0000 mg | Freq: Once | INTRAMUSCULAR | Status: AC | PRN
  Administered 2021-08-26: 2 mg
  Filled 2021-08-26: qty 2

## 2021-08-26 MED ORDER — HEPARIN SOD (PORK) LOCK FLUSH 100 UNIT/ML IV SOLN
500.0000 [IU] | Freq: Once | INTRAVENOUS | Status: AC | PRN
  Administered 2021-08-26: 500 [IU]

## 2021-08-26 MED ORDER — SODIUM CHLORIDE 0.9 % IV SOLN: Freq: Once | INTRAVENOUS | Status: AC

## 2021-08-26 MED ORDER — NIVOLUMAB CHEMO INJECTION 100 MG/10ML
480.0000 mg | Freq: Once | INTRAVENOUS | Status: AC
Start: 2021-08-26 — End: 2021-08-26
  Administered 2021-08-26: 480 mg via INTRAVENOUS
  Filled 2021-08-26: qty 48

## 2021-08-26 NOTE — Patient Instructions (Signed)
[facility]  Discharge Instructions: Thank you for choosing [facility] to provide your oncology and hematology care.  If you have a lab appointment with the [facility], please go directly to the [facility] and check in at the registration area.   Wear comfortable clothing and clothing appropriate for easy access to any Portacath or PICC line.   We strive to give you quality time with your provider. You may need to reschedule your appointment if you arrive late (15 or more minutes).  Arriving late affects you and other patients whose appointments are after yours.  Also, if you miss three or more appointments without notifying the office, you may be dismissed from the clinic at the provider's discretion.      For prescription refill requests, have your pharmacy contact our office and allow 72 hours for refills to be completed.    Today you received the following chemotherapy and/or immunotherapy agents opdivo   To help prevent nausea and vomiting after your treatment, we encourage you to take your nausea medication as directed.  BELOW ARE SYMPTOMS THAT SHOULD BE REPORTED IMMEDIATELY: *FEVER GREATER THAN 100.4 F (38 C) OR HIGHER *CHILLS OR SWEATING *NAUSEA AND VOMITING THAT IS NOT CONTROLLED WITH YOUR NAUSEA MEDICATION *UNUSUAL SHORTNESS OF BREATH *UNUSUAL BRUISING OR BLEEDING *URINARY PROBLEMS (pain or burning when urinating, or frequent urination) *BOWEL PROBLEMS (unusual diarrhea, constipation, pain near the anus) TENDERNESS IN MOUTH AND THROAT WITH OR WITHOUT PRESENCE OF ULCERS (sore throat, sores in mouth, or a toothache) UNUSUAL RASH, SWELLING OR PAIN  UNUSUAL VAGINAL DISCHARGE OR ITCHING   Items with * indicate a potential emergency and should be followed up as soon as possible or go to the Emergency Department if any problems should occur.  Please show the CHEMOTHERAPY ALERT CARD or IMMUNOTHERAPY ALERT CARD at check-in to the Emergency  Department and triage nurse.  Should you have questions after your visit or need to cancel or reschedule your appointment, please contact [facility]  Dept: [phone]  and follow the prompts.  Office hours are 8:00 a.m. to 4:30 p.m. Monday - Friday. Please note that voicemails left after 4:00 p.m. may not be returned until the following business day.  We are closed weekends and major holidays. You have access to a nurse at all times for urgent questions. Please call the main number to the clinic Dept: [phone] and follow the prompts.  For any non-urgent questions, you may also contact your provider using MyChart. We now offer e-Visits for anyone 76 and older to request care online for non-urgent symptoms. For details visit [email or web address].   Also download the MyChart app! Go to the app store, search "MyChart", open the app, select [facility], and log in with your MyChart username and password.  Due to Covid, a mask is required upon entering the hospital/clinic. If you do not have a mask, one will be given to you upon arrival. For doctor visits, patients may have 1 support person aged 75 or older with them. For treatment visits, patients cannot have anyone with them due to current Covid guidelines and our immunocompromised population.   Nivolumab injection What is this medication? NIVOLUMAB (nye VOL ue mab) is a monoclonal antibody. It treats certain types of cancer. Some of the cancers treated are colon cancer, head and neck cancer, Hodgkin lymphoma, lung cancer, and melanoma. This medicine may be used for other purposes; ask your health care provider or pharmacist  if you have questions. COMMON BRAND NAME(S): Opdivo What should I tell my care team before I take this medication? They need to know if you have any of these conditions: autoimmune diseases like Crohn's disease, ulcerative colitis, or lupus have had or planning to have an allogeneic stem cell  transplant (uses someone else's stem cells) history of chest radiation history of organ transplant nervous system problems like myasthenia gravis or Guillain-Barre syndrome an unusual or allergic reaction to nivolumab, other medicines, foods, dyes, or preservatives pregnant or trying to get pregnant breast-feeding How should I use this medication? This medicine is for infusion into a vein. It is given by a health care professional in a hospital or clinic setting. A special MedGuide will be given to you before each treatment. Be sure to read this information carefully each time. Talk to your pediatrician regarding the use of this medicine in children. While this drug may be prescribed for children as young as 12 years for selected conditions, precautions do apply. Overdosage: If you think you have taken too much of this medicine contact a poison control center or emergency room at once. NOTE: This medicine is only for you. Do not share this medicine with others. What if I miss a dose? It is important not to miss your dose. Call your doctor or health care professional if you are unable to keep an appointment. What may interact with this medication? Interactions have not been studied. This list may not describe all possible interactions. Give your health care provider a list of all the medicines, herbs, non-prescription drugs, or dietary supplements you use. Also tell them if you smoke, drink alcohol, or use illegal drugs. Some items may interact with your medicine. What should I watch for while using this medication? This drug may make you feel generally unwell. Continue your course of treatment even though you feel ill unless your doctor tells you to stop. You may need blood work done while you are taking this medicine. Do not become pregnant while taking this medicine or for 5 months after stopping it. Women should inform their doctor if they wish to become pregnant or think they might be  pregnant. There is a potential for serious side effects to an unborn child. Talk to your health care professional or pharmacist for more information. Do not breast-feed an infant while taking this medicine or for 5 months after stopping it. What side effects may I notice from receiving this medication? Side effects that you should report to your doctor or health care professional as soon as possible: allergic reactions like skin rash, itching or hives, swelling of the face, lips, or tongue breathing problems blood in the urine bloody or watery diarrhea or black, tarry stools changes in emotions or moods changes in vision chest pain cough dizziness feeling faint or lightheaded, falls fever, chills headache with fever, neck stiffness, confusion, loss of memory, sensitivity to light, hallucination, loss of contact with reality, or seizures joint pain mouth sores redness, blistering, peeling or loosening of the skin, including inside the mouth severe muscle pain or weakness signs and symptoms of high blood sugar such as dizziness; dry mouth; dry skin; fruity breath; nausea; stomach pain; increased hunger or thirst; increased urination signs and symptoms of kidney injury like trouble passing urine or change in the amount of urine signs and symptoms of liver injury like dark yellow or brown urine; general ill feeling or flu-like symptoms; light-colored stools; loss of appetite; nausea; right upper belly pain; unusually  weak or tired; yellowing of the eyes or skin swelling of the ankles, feet, hands trouble passing urine or change in the amount of urine unusually weak or tired weight gain or loss Side effects that usually do not require medical attention (report to your doctor or health care professional if they continue or are bothersome): bone pain constipation decreased appetite diarrhea muscle pain nausea, vomiting tiredness This list may not describe all possible side effects. Call  your doctor for medical advice about side effects. You may report side effects to FDA at 1-800-FDA-1088. Where should I keep my medication? This drug is given in a hospital or clinic and will not be stored at home. NOTE: This sheet is a summary. It may not cover all possible information. If you have questions about this medicine, talk to your doctor, pharmacist, or health care provider.  2022 Elsevier/Gold Standard (2020-03-10 10:08:25)  

## 2021-09-13 ENCOUNTER — Other Ambulatory Visit: Payer: 59

## 2021-09-13 ENCOUNTER — Ambulatory Visit: Payer: 59 | Admitting: Oncology

## 2021-09-14 ENCOUNTER — Encounter: Payer: Self-pay | Admitting: Oncology

## 2021-09-14 ENCOUNTER — Encounter (HOSPITAL_BASED_OUTPATIENT_CLINIC_OR_DEPARTMENT_OTHER): Payer: 59 | Admitting: Physician Assistant

## 2021-09-14 NOTE — Progress Notes (Signed)
[facility]  [address] [place],  [place]  [address] [phone]  Clinic Day:  09/20/2021  Referring physician: Lillard Anes,*  This document serves as a record of services personally performed by [name], MD. It was created on their behalf by [employer] E, a trained medical scribe. The creation of this record is based on the scribe's personal observations and the provider's statements to them.  HISTORY OF PRESENT ILLNESS:  The patient is a 55 y.o. 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  He comes in today prior to his 12th and final cycle of maintenance nivolumab.  Since his last visit, the patient has been doing well.  He continues to tolerate his nivolumab immunotherapy very well without having any significant side effects.  He denies having any dysphagia, abdominal discomfort or other GI symptoms which concern him for disease recurrence.     With respect to his esophageal cancer treatment history, he completed neoadjuvant chemoradiation, which consisted of 3 cycles of FOLFOX chemotherapy.  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  Pathology from this surgery showed persistent adenocarcinoma which extended into the muscularis propria level of his stomach.  Based upon this, he is undergoing adjuvant nivolumab immunotherapy for 1 year.   PHYSICAL EXAM:  Blood pressure 130/90, pulse 88, temperature 98.5 F (36.9 C), resp. rate 16, height 5\' 9"  (1.753 m), weight 190 lb 14.4 oz (86.6 kg), SpO2 97 %. Wt Readings from Last 3 Encounters:  09/20/21 190 lb 14.4 oz (86.6 kg)  08/26/21 193 lb 1.9 oz (87.6 kg)  08/16/21 193 lb 14.4 oz (88 kg)   Body mass index is 28.19 kg/m. Performance status (ECOG): 1 - Symptomatic but completely ambulatory Physical Exam Constitutional:      General: He is not in acute distress.    Appearance: Normal appearance. He is normal  weight.  HENT:     Head: Normocephalic and atraumatic.  Eyes:     General: No scleral icterus.    Extraocular Movements: Extraocular movements intact.     Conjunctiva/sclera: Conjunctivae normal.     Pupils: Pupils are equal, round, and reactive to light.  Cardiovascular:     Rate and Rhythm: Normal rate and regular rhythm.     Pulses: Normal pulses.     Heart sounds: Normal heart sounds. No murmur heard.   No friction rub. No gallop.  Pulmonary:     Effort: Pulmonary effort is normal. No respiratory distress.     Breath sounds: Normal breath sounds.  Abdominal:     General: Bowel sounds are normal. There is no distension.     Palpations: Abdomen is soft. There is no hepatomegaly, splenomegaly or mass.     Tenderness: There is no abdominal tenderness.  Musculoskeletal:        General: Normal range of motion.     Cervical back: Normal range of motion and neck supple.     Right lower leg: No edema.     Left lower leg: No edema.  Lymphadenopathy:     Cervical: No cervical adenopathy.  Skin:    General: Skin is warm and dry.  Neurological:     General: No focal deficit present.     Mental Status: He is alert and oriented to person, place, and time. Mental status is at baseline.  Psychiatric:        Mood and Affect: Mood normal.  Behavior: Behavior normal.        Thought Content: Thought content normal.        Judgment: Judgment normal.    LABS:    Ref. Range 09/20/2021 00:00  Sodium Latest Ref Range: 137 - 147  138  Potassium Latest Ref Range: 3.4 - 5.3  4.3  Chloride Latest Ref Range: 99 - 108  105  CO2 Latest Ref Range: 13 - 22  25 (A)  Glucose Unknown 126  BUN Latest Ref Range: 4 - 21  20  Creatinine Latest Ref Range: 0.6 - 1.3  0.8  Calcium Latest Ref Range: 8.7 - 10.7  8.8  Alkaline Phosphatase Latest Ref Range: 25 - 125  108  Albumin Latest Ref Range: 3.5 - 5.0  4.0  AST Latest Ref Range: 14 - 40  30  ALT Latest Ref Range: 10 - 40  19  Bilirubin, Total  Unknown 0.6  WBC Unknown 6.2  RBC Latest Ref Range: 3.87 - 5.11  5.14 (A)  Hemoglobin Latest Ref Range: 13.5 - 17.5  16.1  HCT Latest Ref Range: 41 - 53  48  Platelets Latest Ref Range: 150 - 399  219  NEUT# Unknown 4.34    ASSESSMENT & PLAN:  Assessment/Plan:  A 55 y.o. 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 He will proceed with his 12th and final cycle of adjuvant nivolumab immunotherapy next week, which is being given at 480 mg. Clinically, the patient appears to be doing very well.  I will see him back in 6 months for repeat clinical assessment.  CT scans will be done a day before his next visit to ascertain his new disease baseline after the completion of all of his defintive therapy.  The patient understands all the plans discussed today and is in agreement with them.     I, Rita Ohara, am acting as scribe for [name], MD    I have reviewed this report as typed by the medical scribe, and it is complete and accurate.  Alexsis Kathman Macarthur Critchley, MD

## 2021-09-15 ENCOUNTER — Encounter (HOSPITAL_BASED_OUTPATIENT_CLINIC_OR_DEPARTMENT_OTHER): Payer: 59 | Admitting: Internal Medicine

## 2021-09-15 ENCOUNTER — Other Ambulatory Visit: Payer: Self-pay

## 2021-09-15 DIAGNOSIS — Z8501 Personal history of malignant neoplasm of esophagus: Secondary | ICD-10-CM | POA: Diagnosis not present

## 2021-09-15 DIAGNOSIS — T8131XA Disruption of external operation (surgical) wound, not elsewhere classified, initial encounter: Secondary | ICD-10-CM

## 2021-09-15 DIAGNOSIS — K439 Ventral hernia without obstruction or gangrene: Secondary | ICD-10-CM

## 2021-09-15 DIAGNOSIS — L98492 Non-pressure chronic ulcer of skin of other sites with fat layer exposed: Secondary | ICD-10-CM

## 2021-09-16 NOTE — Progress Notes (Signed)
JAIMERE, FEUTZ ([id number]) Visit Report for 09/15/2021 Chief Complaint Document Details Patient Name: Date of Service: MA [name] 09/15/2021 2:45 PM Medical Record Number: [id number] Patient Account Number: [id number] Date of Birth/Sex: Treating RN: 1965-12-08 (55 y.o. [name], [name] Primary Care Provider: [name] Other Clinician: Referring Provider: Treating Provider/Extender: [name] in Treatment: 12 Information Obtained from: Patient Chief Complaint Surgical Ulcer Electronic Signature(s) Signed: 09/15/2021 5:58:56 PM By: [name] DO Entered By: [name] on 09/15/2021 17:52:52 -------------------------------------------------------------------------------- HPI Details Patient Name: Date of Service: MA [name]. 09/15/2021 2:45 PM Medical Record Number: [id number] Patient Account Number: [id number] Date of Birth/Sex: Treating RN: [birth year] (55 y.o. [name], [name] Primary Care Provider: [name] Other Clinician: Referring Provider: Treating Provider/Extender: [name] in Treatment: 12 History of Present Illness HPI Description: 06/22/2021 patient presents today as a pleasant gentleman who unfortunately has been having quite a bit of an issue with his abdominal area following surgery in 2021 November. 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 Subsequently he also was on immunotherapy following to try to help with any residual carcinoma following the procedure. Nonetheless he had an issue with the incision site in the mid abdomen and unfortunately this ended up dehiscing and has had trouble since getting this healed. He has been seen by Dr. [name] him where ACell was utilized but again there is really no response she referred the patient to [place] for further evaluation and treatment. No chemical cauterization with silver nitrate has  been utilized. He also has not had any he tells me aggressive sharp debridement of the area to clear away some of this hypergranular tissue which he has significant amounts noted. Either way I think that we will get a need to try to see about removing this I do think, to send sample of this to biopsy just to make sure there is no signs of cancer noted although I really think that is probably not can be the case I think we will get a get a report back stating hypergranulation tissue. With that being said the patient does seem to have quite a bit of bleeding and friability even with wiping over the wound area and again I think this is a big part of the issue again he can even grow new skin because anything attaching is attaching to the hypergranulation which is not even get a be anywhere close to sufficient for good epithelial growth. He also has had a CT scan in April which showed there was post esophagectomy with gastric pull-up anatomy but no evidence of local recurrence. There is no evidence of mediastinal nodal metastasis. He did have small bilateral pulmonary nodules which were unchanged recommend routine surveillance. Otherwise the patient has a ventral hernia following the surgery but no evidence of obstruction or gangrene. 06/28/2021 upon evaluation today patient's wound actually appears to be doing quite a bit better and it is hypergranular tissue is significantly improved compared to last time. With that being said they had a hard time getting the [facility] I am not sure exactly what all happened but the good news is that is something that should be on the way and he should have that if not today then tomorrow. Overall I think that with the [facility] this would have looked even better but nonetheless I am still pleased with what I am seeing today. 07/06/2021 upon evaluation today patient's wound is actually showing  signs of improvement and in general I feel like he is making excellent  progress. The Hydrofera Blue dressing to be doing excellent for him it does cause a little bit of bleeding when he removes it during dressing changes but other than that he is doing quite well based on what I am seeing. Fortunately there does not appear to be any signs of active infection at this time. No fevers, chills, nausea, vomiting, or diarrhea. 07/13/2021 upon evaluation today patient appears to be doing well with regard to his abdominal ulcer. Overall I am extremely pleased with where things stand I think he is making good progress. I do think the Hydrofera Blue is doing a great job as far as treating this is concerned. I do believe that as long as he is seeing improvement we should continue as such. We may be able to contemplate some other options such as skin substitutes but right now when see how we do with this. 08/03/2021 upon evaluation today patient appears to be doing well with regard to his abdominal ulcer. Week by week this is showing signs of improvement which is good news. Overall the surface of the wound is also greatly improved. I am extremely pleased with where we stand today. 08/24/2021 upon evaluation today patient's wound on the abdominal area appears to be doing excellent today. Fortunately there is no signs of active infection and very pleased in that regard. There does not appear to be any signs of active infection systemically either which is also good news. No fevers, chills, nausea, vomiting, or diarrhea. Upon inspection patient's wound bed showed signs of good granulation epithelization at this point. Fortunately there does not appear to be any signs of active infection at this time which is great news. 10/27; patient presents for 3-week follow-up. He has been using Hydrofera Blue to the wound bed without issues. He has no complaints today. He denies signs of infection. Electronic Signature(s) Signed: 09/15/2021 5:58:56 PM By: [name] DO Entered By: [name] on 09/15/2021 17:54:11 -------------------------------------------------------------------------------- Physical Exam Details Patient Name: Date of Service: MA [name] 09/15/2021 2:45 PM Medical Record Number: [id number] Patient Account Number: [id number] Date of Birth/Sex: Treating RN: [birth year] (55 y.o. [name], [name] Primary Care Provider: [name] Other Clinician: Referring Provider: Treating Provider/Extender: [name] in Treatment: 12 Constitutional respirations regular, non-labored and within target range for patient.Marland Kitchen Psychiatric pleasant and cooperative. Notes Large open wound to the abdomen with granulation tissue and epithelialization. No signs of infection. Electronic Signature(s) Signed: 09/15/2021 5:58:56 PM By: [name] DO Entered By: [name] on 09/15/2021 17:54:59 -------------------------------------------------------------------------------- Physician Orders Details Patient Name: Date of Service: MA [name]. 09/15/2021 2:45 PM Medical Record Number: [id number] Patient Account Number: [id number] Date of Birth/Sex: Treating RN: 09-May-1966 (55 y.o. [name], [name] Primary Care Provider: [name] Other Clinician: Referring Provider: Treating Provider/Extender: [name] in Treatment: 12 Verbal / Phone Orders: No Diagnosis Coding Follow-up Appointments Return appointment in 3 weeks. - with Margarita Grizzle on Wednesday's Bathing/ Shower/ Hygiene May shower and wash wound with soap and water. Additional Orders / Instructions Follow Nutritious Diet - Increase protein Wound Treatment Wound #1 - Abdomen - midline Cleanser: Soap and Water Every Other Day/Other:90 days Discharge Instructions: May shower and wash wound with dial antibacterial soap and water prior to dressing change. Cleanser: Wound Cleanser Every Other Day/Other:90 days Discharge  Instructions: Cleanse the wound with wound cleanser prior to applying a clean  dressing using gauze sponges, not tissue or cotton balls. Prim Dressing: Hydrofera Blue Ready Foam, 4x5 in Every Other Day/Other:90 days ary Discharge Instructions: Apply to wound bed as instructed Secondary Dressing: ABD Pad, 8x10 Every Other Day/Other:90 days Discharge Instructions: Apply over primary dressing as directed. Secured With: 1M Medipore H Soft Cloth Surgical T 4 x 2 (in/yd) Every Other Day/Other:90 days ape Discharge Instructions: Secure dressing with tape as directed. Electronic Signature(s) Signed: 09/15/2021 5:58:56 PM By: [name] DO Entered By: [name] on 09/15/2021 17:55:11 -------------------------------------------------------------------------------- Problem List Details Patient Name: Date of Service: MA [name]. 09/15/2021 2:45 PM Medical Record Number: [id number] Patient Account Number: [id number] Date of Birth/Sex: Treating RN: [birth year] (55 y.o. [name], [name] Primary Care Provider: [name] Other Clinician: Referring Provider: Treating Provider/Extender: [name] in Treatment: 12 Active Problems ICD-10 Encounter Code Description Active Date MDM Diagnosis T81.31XA Disruption of external operation (surgical) wound, not elsewhere classified, 06/22/2021 No Yes initial encounter L98.492 Non-pressure chronic ulcer of skin of other sites with fat layer exposed 06/22/2021 No Yes Z85.01 Personal history of malignant neoplasm of esophagus 06/22/2021 No Yes K43.9 Ventral hernia without obstruction or gangrene 06/22/2021 No Yes Inactive Problems Resolved Problems Electronic Signature(s) Signed: 09/15/2021 5:58:56 PM By: [name] DO Entered By: [name] on 09/15/2021 17:52:38 -------------------------------------------------------------------------------- Progress Note Details Patient Name: Date of  Service: MA [name]. 09/15/2021 2:45 PM Medical Record Number: [id number] Patient Account Number: [id number] Date of Birth/Sex: Treating RN: 03-02-1966 (55 y.o. [name], [name] Primary Care Provider: [name] Other Clinician: Referring Provider: Treating Provider/Extender: [name] in Treatment: 12 Subjective Chief Complaint Information obtained from Patient Surgical Ulcer History of Present Illness (HPI) 06/22/2021 patient presents today as a pleasant gentleman who unfortunately has been having quite a bit of an issue with his abdominal area following surgery in 2021 November. 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 Subsequently he also was on immunotherapy following to try to help with any residual carcinoma following the procedure. Nonetheless he had an issue with the incision site in the mid abdomen and unfortunately this ended up dehiscing and has had trouble since getting this healed. He has been seen by Dr. [name] him where ACell was utilized but again there is really no response she referred the patient to [place] for further evaluation and treatment. No chemical cauterization with silver nitrate has been utilized. He also has not had any he tells me aggressive sharp debridement of the area to clear away some of this hypergranular tissue which he has significant amounts noted. Either way I think that we will get a need to try to see about removing this I do think, to send sample of this to biopsy just to make sure there is no signs of cancer noted although I really think that is probably not can be the case I think we will get a get a report back stating hypergranulation tissue. With that being said the patient does seem to have quite a bit of bleeding and friability even with wiping over the wound area and again I think this is a big part of the issue again he can even grow new skin because anything  attaching is attaching to the hypergranulation which is not even get a be anywhere close to sufficient for good epithelial growth. He also has had a CT scan in April which showed there was post esophagectomy with gastric pull-up anatomy but  no evidence of local recurrence. There is no evidence of mediastinal nodal metastasis. He did have small bilateral pulmonary nodules which were unchanged recommend routine surveillance. Otherwise the patient has a ventral hernia following the surgery but no evidence of obstruction or gangrene. 06/28/2021 upon evaluation today patient's wound actually appears to be doing quite a bit better and it is hypergranular tissue is significantly improved compared to last time. With that being said they had a hard time getting the [facility] I am not sure exactly what all happened but the good news is that is something that should be on the way and he should have that if not today then tomorrow. Overall I think that with the [facility] this would have looked even better but nonetheless I am still pleased with what I am seeing today. 07/06/2021 upon evaluation today patient's wound is actually showing signs of improvement and in general I feel like he is making excellent progress. The Hydrofera Blue dressing to be doing excellent for him it does cause a little bit of bleeding when he removes it during dressing changes but other than that he is doing quite well based on what I am seeing. Fortunately there does not appear to be any signs of active infection at this time. No fevers, chills, nausea, vomiting, or diarrhea. 07/13/2021 upon evaluation today patient appears to be doing well with regard to his abdominal ulcer. Overall I am extremely pleased with where things stand I think he is making good progress. I do think the Hydrofera Blue is doing a great job as far as treating this is concerned. I do believe that as long as he is seeing improvement we should continue as  such. We may be able to contemplate some other options such as skin substitutes but right now when see how we do with this. 08/03/2021 upon evaluation today patient appears to be doing well with regard to his abdominal ulcer. Week by week this is showing signs of improvement which is good news. Overall the surface of the wound is also greatly improved. I am extremely pleased with where we stand today. 08/24/2021 upon evaluation today patient's wound on the abdominal area appears to be doing excellent today. Fortunately there is no signs of active infection and very pleased in that regard. There does not appear to be any signs of active infection systemically either which is also good news. No fevers, chills, nausea, vomiting, or diarrhea. Upon inspection patient's wound bed showed signs of good granulation epithelization at this point. Fortunately there does not appear to be any signs of active infection at this time which is great news. 10/27; patient presents for 3-week follow-up. He has been using Hydrofera Blue to the wound bed without issues. He has no complaints today. He denies signs of infection. Patient History Information obtained from Patient. Family History Cancer - Siblings, No family history of Diabetes, Heart Disease, Hereditary Spherocytosis, Hypertension, Kidney Disease, Lung Disease, Seizures, Stroke, Thyroid Problems, Tuberculosis. Social History Former smoker, Marital Status - Married, Alcohol Use - Never, Drug Use - No History, Caffeine Use - Daily. Medical History Oncologic Patient has history of Received Chemotherapy - 09/20/20, Received Radiation Medical A Surgical History Notes nd Gastrointestinal esophageal carcinoma Objective Constitutional respirations regular, non-labored and within target range for patient.. Vitals Time Taken: 3:17 PM, Height: 69 in, Weight: 191 lbs, BMI: 28.2, Temperature: 98.9 F, Pulse: 74 bpm, Respiratory Rate: 17 breaths/min, Blood  Pressure: 133/74 mmHg. Psychiatric pleasant and cooperative. General Notes: Large open  wound to the abdomen with granulation tissue and epithelialization. No signs of infection. Integumentary (Hair, Skin) Wound #1 status is Open. Original cause of wound was Surgical Injury. The date acquired was: 09/30/2020. The wound has been in treatment 12 weeks. The wound is located on the Abdomen - midline. The wound measures 7cm length x 5.5cm width x 0.1cm depth; 30.238cm^2 area and 3.024cm^3 volume. There is Fat Layer (Subcutaneous Tissue) exposed. There is no tunneling or undermining noted. There is a medium amount of serosanguineous drainage noted. The wound margin is distinct with the outline attached to the wound base. There is large (67-100%) red, hyper - granulation within the wound bed. There is no necrotic tissue within the wound bed. Assessment Active Problems ICD-10 Disruption of external operation (surgical) wound, not elsewhere classified, initial encounter Non-pressure chronic ulcer of skin of other sites with fat layer exposed Personal history of malignant neoplasm of esophagus Ventral hernia without obstruction or gangrene This is a patient that follows closely with Margarita Grizzle and due to insurance issues has switched over care with me. His current abdominal wound has shown improvement in size and appearance since last clinic visit. He has been using Hydrofera Blue and we will continue with this. There are no signs of infection. He has been following up every 3 weeks. He knows to call with any questions or concerns and can be seen sooner. Plan Follow-up Appointments: Return appointment in 3 weeks. - with Margarita Grizzle on Wednesday's Bathing/ Shower/ Hygiene: May shower and wash wound with soap and water. Additional Orders / Instructions: Follow Nutritious Diet - Increase protein WOUND #1: - Abdomen - midline Wound Laterality: Cleanser: Soap and Water Every Other Day/Other:90 days Discharge  Instructions: May shower and wash wound with dial antibacterial soap and water prior to dressing change. Cleanser: Wound Cleanser Every Other Day/Other:90 days Discharge Instructions: Cleanse the wound with wound cleanser prior to applying a clean dressing using gauze sponges, not tissue or cotton balls. Prim Dressing: Hydrofera Blue Ready Foam, 4x5 in Every Other Day/Other:90 days ary Discharge Instructions: Apply to wound bed as instructed Secondary Dressing: ABD Pad, 8x10 Every Other Day/Other:90 days Discharge Instructions: Apply over primary dressing as directed. Secured With: 79M Medipore H Soft Cloth Surgical T 4 x 2 (in/yd) Every Other Day/Other:90 days ape Discharge Instructions: Secure dressing with tape as directed. 1. Hydrofera Blue 2. Follow-up in 3 weeks Electronic Signature(s) Signed: 09/15/2021 5:58:56 PM By: [name] DO Entered By: [name] on 09/15/2021 17:57:47 -------------------------------------------------------------------------------- HxROS Details Patient Name: Date of Service: MA [name]. 09/15/2021 2:45 PM Medical Record Number: [id number] Patient Account Number: [id number] Date of Birth/Sex: Treating RN: 04/21/1966 (55 y.o. [name], [name] Primary Care Provider: [name] Other Clinician: Referring Provider: Treating Provider/Extender: [name] in Treatment: 12 Information Obtained From Patient Gastrointestinal Medical History: Past Medical History Notes: esophageal carcinoma Oncologic Medical History: Positive for: Received Chemotherapy - 09/20/20; Received Radiation Immunizations Pneumococcal Vaccine: Received Pneumococcal Vaccination: No Implantable Devices Yes Family and Social History Cancer: Yes - Siblings; Diabetes: No; Heart Disease: No; Hereditary Spherocytosis: No; Hypertension: No; Kidney Disease: No; Lung Disease: No; Seizures: No; Stroke: No; Thyroid Problems: No;  Tuberculosis: No; Former smoker; Marital Status - Married; Alcohol Use: Never; Drug Use: No History; Caffeine Use: Daily; Financial Concerns: No; Food, Clothing or Shelter Needs: No; Support System Lacking: No; Transportation Concerns: No Electronic Signature(s) Signed: 09/15/2021 5:58:56 PM By: [name] DO Signed: 09/16/2021 12:25:45 PM By: Rhae Hammock RN Entered By: Heber Clearlake,  Juelz Claar on 09/15/2021 17:54:17 -------------------------------------------------------------------------------- SuperBill Details Patient Name: Date of Service: MA [name] 09/15/2021 Medical Record Number: [id number] Patient Account Number: [id number] Date of Birth/Sex: Treating RN: May 26, 1966 (55 y.o. [name], [name] Primary Care Provider: [name] Other Clinician: Referring Provider: Treating Provider/Extender: [name] in Treatment: 12 Diagnosis Coding ICD-10 Codes Code Description T81.31XA Disruption of external operation (surgical) wound, not elsewhere classified, initial encounter L98.492 Non-pressure chronic ulcer of skin of other sites with fat layer exposed Z85.01 Personal history of malignant neoplasm of esophagus K43.9 Ventral hernia without obstruction or gangrene Facility Procedures CPT4 Code: [id number] Description: 99213 - WOUND CARE VISIT-LEV 3 EST PT Modifier: Quantity: 1 Physician Procedures : CPT4 Code Description Modifier [id number] - WC PHYS LEVEL 3 - EST PT ICD-10 Diagnosis Description T81.31XA Disruption of external operation (surgical) wound, not elsewhere classified, initial encounter L98.492 Non-pressure chronic ulcer of skin of  other sites with fat layer exposed Z85.01 Personal history of malignant neoplasm of esophagus K43.9 Ventral hernia without obstruction or gangrene Quantity: 1 Electronic Signature(s) Signed: 09/16/2021 11:53:18 AM By: [name] DO Signed: 09/16/2021 12:25:45 PM By: Rhae Hammock  RN Previous Signature: 09/15/2021 5:58:56 PM Version By: [name] DO Entered By: Rhae Hammock on 09/16/2021 11:17:02

## 2021-09-16 NOTE — Progress Notes (Signed)
LOVEL, SUAZO ([id number]) Visit Report for 09/15/2021 Arrival Information Details Patient Name: Date of Service: [name] 09/15/2021 2:45 PM Medical Record Number: [id number] Patient Account Number: [id number] Date of Birth/Sex: Treating RN: 05-Nov-1966 (55 y.o. [name], [name] Primary Care Alany Borman: [name] Other Clinician: Referring Nicholous Girgenti: Treating Makaleigh Reinard/Extender: [name] in Treatment: 12 Visit Information History Since Last Visit Added or deleted any medications: No Patient Arrived: Ambulatory Any new allergies or adverse reactions: No Arrival Time: 15:16 Had a fall or experienced change in No Accompanied By: wife activities of daily living that may affect Transfer Assistance: None risk of falls: Patient Identification Verified: Yes Signs or symptoms of abuse/neglect since last visito No Secondary Verification Process Completed: Yes Hospitalized since last visit: No Patient Requires Transmission-Based Precautions: No Implantable device outside of the clinic excluding No Patient Has Alerts: No cellular tissue based products placed in the center since last visit: Has Dressing in Place as Prescribed: Yes Pain Present Now: No Electronic Signature(s) Signed: 09/16/2021 12:25:45 PM By: Rhae Hammock RN Entered By: Rhae Hammock on 09/15/2021 15:16:22 -------------------------------------------------------------------------------- Clinic Level of Care Assessment Details Patient Name: Date of Service: [place] [name] 09/15/2021 2:45 PM Medical Record Number: [id number] Patient Account Number: [id number] Date of Birth/Sex: Treating RN: Nov 10, 1966 (55 y.o. [name], [name] Primary Care Lola Lofaro: [name] Other Clinician: Referring Qusai Kem: Treating Itzy Adler/Extender: [name] in Treatment: 12 Clinic Level of Care Assessment Items TOOL 4 Quantity Score X- 1  0 Use when only an EandM is performed on FOLLOW-UP visit ASSESSMENTS - Nursing Assessment / Reassessment X- 1 10 Reassessment of Co-morbidities (includes updates in patient status) X- 1 5 Reassessment of Adherence to Treatment Plan ASSESSMENTS - Wound and Skin A ssessment / Reassessment X - Simple Wound Assessment / Reassessment - one wound 1 5 []  - 0 Complex Wound Assessment / Reassessment - multiple wounds []  - 0 Dermatologic / Skin Assessment (not related to wound area) ASSESSMENTS - Focused Assessment []  - 0 Circumferential Edema Measurements - multi extremities []  - 0 Nutritional Assessment / Counseling / Intervention []  - 0 Lower Extremity Assessment (monofilament, tuning fork, pulses) []  - 0 Peripheral Arterial Disease Assessment (using hand held doppler) ASSESSMENTS - Ostomy and/or Continence Assessment and Care []  - 0 Incontinence Assessment and Management []  - 0 Ostomy Care Assessment and Management (repouching, etc.) PROCESS - Coordination of Care X - Simple Patient / Family Education for ongoing care 1 15 []  - 0 Complex (extensive) Patient / Family Education for ongoing care X- 1 10 Staff obtains Programmer, systems, Records, T Results / Process Orders est []  - 0 Staff telephones HHA, Nursing Homes / Clarify orders / etc []  - 0 Routine Transfer to another Facility (non-emergent condition) []  - 0 Routine Hospital Admission (non-emergent condition) []  - 0 New Admissions / Biomedical engineer / Ordering NPWT Apligraf, etc. , []  - 0 Emergency Hospital Admission (emergent condition) X- 1 10 Simple Discharge Coordination []  - 0 Complex (extensive) Discharge Coordination PROCESS - Special Needs []  - 0 Pediatric / Minor Patient Management []  - 0 Isolation Patient Management []  - 0 Hearing / Language / Visual special needs []  - 0 Assessment of Community assistance (transportation, D/C planning, etc.) []  - 0 Additional assistance / Altered mentation []  -  0 Support Surface(s) Assessment (bed, cushion, seat, etc.) INTERVENTIONS - Wound Cleansing / Measurement X - Simple Wound Cleansing - one wound 1 5 []  - 0 Complex Wound Cleansing - multiple  wounds X- 1 5 Wound Imaging (photographs - any number of wounds) []  - 0 Wound Tracing (instead of photographs) X- 1 5 Simple Wound Measurement - one wound []  - 0 Complex Wound Measurement - multiple wounds INTERVENTIONS - Wound Dressings X - Small Wound Dressing one or multiple wounds 1 10 []  - 0 Medium Wound Dressing one or multiple wounds []  - 0 Large Wound Dressing one or multiple wounds []  - 0 Application of Medications - topical []  - 0 Application of Medications - injection INTERVENTIONS - Miscellaneous []  - 0 External ear exam []  - 0 Specimen Collection (cultures, biopsies, blood, body fluids, etc.) []  - 0 Specimen(s) / Culture(s) sent or taken to Lab for analysis []  - 0 Patient Transfer (multiple staff / Civil Service fast streamer / Similar devices) []  - 0 Simple Staple / Suture removal (25 or less) []  - 0 Complex Staple / Suture removal (26 or more) []  - 0 Hypo / Hyperglycemic Management (close monitor of Blood Glucose) []  - 0 Ankle / Brachial Index (ABI) - do not check if billed separately X- 1 5 Vital Signs Has the patient been seen at the hospital within the last three years: Yes Total Score: 85 Level Of Care: New/Established - Level 3 Electronic Signature(s) Signed: 09/16/2021 12:25:45 PM By: Rhae Hammock RN Entered By: Rhae Hammock on 09/15/2021 15:36:30 -------------------------------------------------------------------------------- Encounter Discharge Information Details Patient Name: Date of Service: [place] [name]. 09/15/2021 2:45 PM Medical Record Number: [id number] Patient Account Number: [id number] Date of Birth/Sex: Treating RN: March 07, 1966 (55 y.o. [name], [name] Primary Care Krystian Ferrentino: [name] Other Clinician: Referring Kanaan Kagawa: Treating  Shailey Butterbaugh/Extender: [name] in Treatment: 12 Encounter Discharge Information Items Discharge Condition: Stable Ambulatory Status: Ambulatory Discharge Destination: Home Transportation: Private Auto Accompanied By: Kateri Mc Schedule Follow-up Appointment: Yes Clinical Summary of Care: Patient Declined Electronic Signature(s) Signed: 09/16/2021 12:25:45 PM By: Rhae Hammock RN Entered By: Rhae Hammock on 09/15/2021 15:30:11 -------------------------------------------------------------------------------- Lower Extremity Assessment Details Patient Name: Date of Service: [place] [name]. 09/15/2021 2:45 PM Medical Record Number: [id number] Patient Account Number: [id number] Date of Birth/Sex: Treating RN: Dec 12, 1965 (56 y.o. [name], [name] Primary Care Jaislyn Blinn: [name] Other Clinician: Referring Abdulkadir Emmanuel: Treating Seira Cody/Extender: [name] in Treatment: 12 Electronic Signature(s) Signed: 09/16/2021 12:25:45 PM By: Rhae Hammock RN Entered By: Rhae Hammock on 09/15/2021 15:13:10 -------------------------------------------------------------------------------- Multi Wound Chart Details Patient Name: Date of Service: [place] [name]. 09/15/2021 2:45 PM Medical Record Number: [id number] Patient Account Number: [id number] Date of Birth/Sex: Treating RN: 1966-01-06 (55 y.o. [name], [name] Primary Care Aviyon Hocevar: [name] Other Clinician: Referring Jaimey Franchini: Treating Gretel Cantu/Extender: [name] in Treatment: 12 Vital Signs Height(in): 69 Pulse(bpm): 74 Weight(lbs): 191 Blood Pressure(mmHg): 133/74 Body Mass Index(BMI): 28 Temperature(F): 98.9 Respiratory Rate(breaths/min): 17 Photos: [N/A:N/A] Abdomen - midline N/A N/A Wound Location: Surgical Injury N/A N/A Wounding Event: Open Surgical Wound N/A N/A Primary Etiology: Received  Chemotherapy, Received N/A N/A Comorbid History: Radiation 09/30/2020 N/A N/A Date Acquired: 12 N/A N/A Weeks of Treatment: Open N/A N/A Wound Status: 7x5.5x0.1 N/A N/A Measurements L x W x D (cm) 30.238 N/A N/A A (cm) : rea 3.024 N/A N/A Volume (cm) : 41.90% N/A N/A % Reduction in Area: 41.90% N/A N/A % Reduction in Volume: Full Thickness Without Exposed N/A N/A Classification: Support Structures Medium N/A N/A Exudate Amount: Serosanguineous N/A N/A Exudate Type: red, brown N/A N/A Exudate Color: Distinct, outline attached N/A N/A Wound Margin: Large (67-100%)  N/A N/A Granulation Amount: Red, Hyper-granulation N/A N/A Granulation Quality: None Present (0%) N/A N/A Necrotic Amount: Fat Layer (Subcutaneous Tissue): Yes N/A N/A Exposed Structures: Fascia: No Tendon: No Muscle: No Joint: No Bone: No Small (1-33%) N/A N/A Epithelialization: Treatment Notes Wound #1 (Abdomen - midline) Cleanser Soap and Water Discharge Instruction: May shower and wash wound with dial antibacterial soap and water prior to dressing change. Wound Cleanser Discharge Instruction: Cleanse the wound with wound cleanser prior to applying a clean dressing using gauze sponges, not tissue or cotton balls. Peri-Wound Care Topical Primary Dressing Hydrofera Blue Ready Foam, 4x5 in Discharge Instruction: Apply to wound bed as instructed Secondary Dressing ABD Pad, 8x10 Discharge Instruction: Apply over primary dressing as directed. Secured With 56M Medipore H Soft Cloth Surgical T 4 x 2 (in/yd) ape Discharge Instruction: Secure dressing with tape as directed. Compression Wrap Compression Stockings Add-Ons Electronic Signature(s) Signed: 09/15/2021 5:58:56 PM By: [name] DO Signed: 09/16/2021 12:25:45 PM By: Rhae Hammock RN Entered By: [name] on 09/15/2021  17:52:44 -------------------------------------------------------------------------------- Multi-Disciplinary Care Plan Details Patient Name: Date of Service: [place] [name]. 09/15/2021 2:45 PM Medical Record Number: [id number] Patient Account Number: [id number] Date of Birth/Sex: Treating RN: [birth year] (55 y.o. [name], [name] Primary Care Rudie Rikard: [name] Other Clinician: Referring Kairyn Olmeda: Treating Jeymi Hepp/Extender: [name] in Treatment: [place] reviewed with physician Active Inactive Wound/Skin Impairment Nursing Diagnoses: Impaired tissue integrity Knowledge deficit related to ulceration/compromised skin integrity Goals: Patient/caregiver will verbalize understanding of skin care regimen Date Initiated: 06/22/2021 Target Resolution Date: 09/18/2021 Goal Status: Active Ulcer/skin breakdown will have a volume reduction of 30% by week 4 Date Initiated: 06/22/2021 Date Inactivated: 08/03/2021 Target Resolution Date: 07/20/2021 Goal Status: Met Ulcer/skin breakdown will have a volume reduction of 50% by week 8 Date Initiated: 08/03/2021 Date Inactivated: 08/24/2021 Target Resolution Date: 08/17/2021 Unmet Reason: 30% both volume Goal Status: Unmet reduction. wound has made improvement see wound chart. Interventions: Assess patient/caregiver ability to obtain necessary supplies Assess patient/caregiver ability to perform ulcer/skin care regimen upon admission and as needed Assess ulceration(s) every visit Provide education on ulcer and skin care Treatment Activities: Skin care regimen initiated : 06/22/2021 Topical wound management initiated : 06/22/2021 Notes: Electronic Signature(s) Signed: 09/16/2021 12:25:45 PM By: Rhae Hammock RN Entered By: Rhae Hammock on 09/15/2021 15:23:32 -------------------------------------------------------------------------------- Pain Assessment Details Patient  Name: Date of Service: [place] [name]. 09/15/2021 2:45 PM Medical Record Number: [id number] Patient Account Number: [id number] Date of Birth/Sex: Treating RN: April 25, 1966 (55 y.o. [name], [name] Primary Care Blas Riches: [name] Other Clinician: Referring Jaydis Duchene: Treating Leonora Gores/Extender: [name] in Treatment: 12 Active Problems Location of Pain Severity and Description of Pain Patient Has Paino No Site Locations Pain Management and Medication Current Pain Management: Electronic Signature(s) Signed: 09/16/2021 12:25:45 PM By: Rhae Hammock RN Entered By: Rhae Hammock on 09/15/2021 15:13:04 -------------------------------------------------------------------------------- Patient/Caregiver Education Details Patient Name: Date of Service: [place] [name] 10/27/2022andnbsp2:45 PM Medical Record Number: [id number] Patient Account Number: [id number] Date of Birth/Gender: Treating RN: Jul 21, 1966 (55 y.o. Erie Noe Primary Care Physician: [name] Other Clinician: Referring Physician: Treating Physician/Extender: [name] in Treatment: 12 Education Assessment Education Provided To: Patient Education Topics Provided Wound/Skin Impairment: Methods: Explain/Verbal Responses: Reinforcements needed Electronic Signature(s) Signed: 09/16/2021 12:25:45 PM By: Rhae Hammock RN Entered By: Rhae Hammock on 09/15/2021 15:25:24 -------------------------------------------------------------------------------- Wound Assessment Details Patient Name: Date of Service: [name] 09/15/2021  2:45 PM Medical Record Number: [id number] Patient Account Number: [id number] Date of Birth/Sex: Treating RN: 1966-05-18 (55 y.o. [name], [name] Primary Care Olukemi Panchal: [name] Other Clinician: Referring Kobe Ofallon: Treating Garcia Dalzell/Extender: [name] in Treatment: 12 Wound Status Wound Number: 1 Primary Etiology: Open Surgical Wound Wound Location: Abdomen - midline Wound Status: Open Wounding Event: Surgical Injury Comorbid History: Received Chemotherapy, Received Radiation Date Acquired: 09/30/2020 Weeks Of Treatment: 12 Clustered Wound: No Photos Wound Measurements Length: (cm) 7 Width: (cm) 5.5 Depth: (cm) 0.1 Area: (cm) 30.238 Volume: (cm) 3.024 % Reduction in Area: 41.9% % Reduction in Volume: 41.9% Epithelialization: Small (1-33%) Tunneling: No Undermining: No Wound Description Classification: Full Thickness Without Exposed Support Structures Wound Margin: Distinct, outline attached Exudate Amount: Medium Exudate Type: Serosanguineous Exudate Color: red, brown Foul Odor After Cleansing: No Slough/Fibrino No Wound Bed Granulation Amount: Large (67-100%) Exposed Structure Granulation Quality: Red, Hyper-granulation Fascia Exposed: No Necrotic Amount: None Present (0%) Fat Layer (Subcutaneous Tissue) Exposed: Yes Tendon Exposed: No Muscle Exposed: No Joint Exposed: No Bone Exposed: No Treatment Notes Wound #1 (Abdomen - midline) Cleanser Soap and Water Discharge Instruction: May shower and wash wound with dial antibacterial soap and water prior to dressing change. Wound Cleanser Discharge Instruction: Cleanse the wound with wound cleanser prior to applying a clean dressing using gauze sponges, not tissue or cotton balls. Peri-Wound Care Topical Primary Dressing Hydrofera Blue Ready Foam, 4x5 in Discharge Instruction: Apply to wound bed as instructed Secondary Dressing ABD Pad, 8x10 Discharge Instruction: Apply over primary dressing as directed. Secured With 79M Medipore H Soft Cloth Surgical T 4 x 2 (in/yd) ape Discharge Instruction: Secure dressing with tape as directed. Compression Wrap Compression Stockings Add-Ons Electronic Signature(s) Signed: 09/16/2021 12:25:45 PM By:  Rhae Hammock RN Entered By: Rhae Hammock on 09/15/2021 15:18:43 -------------------------------------------------------------------------------- Vitals Details Patient Name: Date of Service: [place] [name]. 09/15/2021 2:45 PM Medical Record Number: [id number] Patient Account Number: [id number] Date of Birth/Sex: Treating RN: August 24, 1966 (55 y.o. [name], [name] Primary Care Riker Collier: [name] Other Clinician: Referring Juanita Streight: Treating Negin Hegg/Extender: [name] in Treatment: 12 Vital Signs Time Taken: 15:17 Temperature (F): 98.9 Height (in): 69 Pulse (bpm): 74 Weight (lbs): 191 Respiratory Rate (breaths/min): 17 Body Mass Index (BMI): 28.2 Blood Pressure (mmHg): 133/74 Reference Range: 80 - 120 mg / dl Electronic Signature(s) Signed: 09/16/2021 12:25:45 PM By: Rhae Hammock RN Entered By: Rhae Hammock on 09/15/2021 15:16:45

## 2021-09-20 ENCOUNTER — Telehealth: Payer: Self-pay | Admitting: Oncology

## 2021-09-20 ENCOUNTER — Inpatient Hospital Stay (INDEPENDENT_AMBULATORY_CARE_PROVIDER_SITE_OTHER): Payer: 59 | Admitting: Oncology

## 2021-09-20 ENCOUNTER — Other Ambulatory Visit: Payer: Self-pay | Admitting: Oncology

## 2021-09-20 ENCOUNTER — Encounter: Payer: Self-pay | Admitting: Oncology

## 2021-09-20 ENCOUNTER — Inpatient Hospital Stay: Payer: 59 | Attending: Oncology

## 2021-09-20 VITALS — BP 130/90 | HR 88 | Temp 98.5°F | Resp 16 | Ht 69.0 in | Wt 190.9 lb

## 2021-09-20 DIAGNOSIS — Z5112 Encounter for antineoplastic immunotherapy: Secondary | ICD-10-CM | POA: Insufficient documentation

## 2021-09-20 DIAGNOSIS — C159 Malignant neoplasm of esophagus, unspecified: Secondary | ICD-10-CM

## 2021-09-20 DIAGNOSIS — C160 Malignant neoplasm of cardia: Secondary | ICD-10-CM | POA: Diagnosis present

## 2021-09-20 LAB — BASIC METABOLIC PANEL
BUN: 20 (ref 4–21)
CO2: 25 — AB (ref 13–22)
Chloride: 105 (ref 99–108)
Creatinine: 0.8 (ref 0.6–1.3)
Glucose: 126
Potassium: 4.3 (ref 3.4–5.3)
Sodium: 138 (ref 137–147)

## 2021-09-20 LAB — COMPREHENSIVE METABOLIC PANEL
Albumin: 4 (ref 3.5–5.0)
Calcium: 8.8 (ref 8.7–10.7)

## 2021-09-20 LAB — HEPATIC FUNCTION PANEL
ALT: 19 (ref 10–40)
AST: 30 (ref 14–40)
Alkaline Phosphatase: 108 (ref 25–125)
Bilirubin, Total: 0.6

## 2021-09-20 LAB — CBC AND DIFFERENTIAL
HCT: 48 (ref 41–53)
Hemoglobin: 16.1 (ref 13.5–17.5)
Neutrophils Absolute: 4.34
Platelets: 219 (ref 150–399)
WBC: 6.2

## 2021-09-20 LAB — TSH: TSH: 0.632 u[IU]/mL (ref 0.350–4.500)

## 2021-09-20 LAB — CBC: RBC: 5.14 — AB (ref 3.87–5.11)

## 2021-09-20 NOTE — Telephone Encounter (Signed)
Per 11/1 LOS, patient scheduled for 12/16 Follow Up - Waiting PreAuth before scheduling 12/15 Labs, CT Scans

## 2021-09-21 ENCOUNTER — Encounter: Payer: Self-pay | Admitting: Oncology

## 2021-09-22 LAB — T4: T4, Total: 7.6 ug/dL (ref 4.5–12.0)

## 2021-09-22 MED FILL — Nivolumab IV Soln 100 MG/10ML: INTRAVENOUS | Qty: 48 | Status: AC

## 2021-09-23 ENCOUNTER — Inpatient Hospital Stay: Payer: 59

## 2021-09-23 ENCOUNTER — Other Ambulatory Visit: Payer: Self-pay

## 2021-09-23 VITALS — BP 126/81 | HR 89 | Temp 97.9°F | Resp 18 | Ht 69.0 in | Wt 191.0 lb

## 2021-09-23 DIAGNOSIS — Z5112 Encounter for antineoplastic immunotherapy: Secondary | ICD-10-CM | POA: Diagnosis not present

## 2021-09-23 DIAGNOSIS — C159 Malignant neoplasm of esophagus, unspecified: Secondary | ICD-10-CM

## 2021-09-23 MED ORDER — HEPARIN SOD (PORK) LOCK FLUSH 100 UNIT/ML IV SOLN
500.0000 [IU] | Freq: Once | INTRAVENOUS | Status: AC | PRN
  Administered 2021-09-23: 500 [IU]

## 2021-09-23 MED ORDER — SODIUM CHLORIDE 0.9% FLUSH
10.0000 mL | INTRAVENOUS | Status: DC | PRN
  Administered 2021-09-23: 10 mL

## 2021-09-23 MED ORDER — SODIUM CHLORIDE 0.9 % IV SOLN: Freq: Once | INTRAVENOUS | Status: AC

## 2021-09-23 MED ORDER — SODIUM CHLORIDE 0.9 % IV SOLN
480.0000 mg | Freq: Once | INTRAVENOUS | Status: AC
  Administered 2021-09-23: 480 mg via INTRAVENOUS
  Filled 2021-09-23: qty 48

## 2021-09-23 NOTE — Patient Instructions (Signed)
Nivolumab injection °What is this medication? °NIVOLUMAB (nye VOL ue mab) is a monoclonal antibody. It treats certain types of cancer. Some of the cancers treated are colon cancer, head and neck cancer, Hodgkin lymphoma, lung cancer, and melanoma. °This medicine may be used for other purposes; ask your health care provider or pharmacist if you have questions. °COMMON BRAND NAME(S): Opdivo °What should I tell my care team before I take this medication? °They need to know if you have any of these conditions: °Autoimmune diseases such as Crohn's disease, ulcerative colitis, or lupus °Have had or planning to have an allogeneic stem cell transplant (uses someone else's stem cells) °History of chest radiation °Organ transplant °Nervous system problems such as myasthenia gravis or Guillain-Barre syndrome °An unusual or allergic reaction to nivolumab, other medicines, foods, dyes, or preservatives °Pregnant or trying to get pregnant °Breast-feeding °How should I use this medication? °This medication is injected into a vein. It is given in a hospital or clinic setting. °A special MedGuide will be given to you before each treatment. Be sure to read this information carefully each time. °Talk to your care team regarding the use of this medication in children. While it may be prescribed for children as young as 12 years for selected conditions, precautions do apply. °Overdosage: If you think you have taken too much of this medicine contact a poison control center or emergency room at once. °NOTE: This medicine is only for you. Do not share this medicine with others. °What if I miss a dose? °Keep appointments for follow-up doses. It is important not to miss your dose. Call your care team if you are unable to keep an appointment. °What may interact with this medication? °Interactions have not been studied. °This list may not describe all possible interactions. Give your health care provider a list of all the medicines, herbs,  non-prescription drugs, or dietary supplements you use. Also tell them if you smoke, drink alcohol, or use illegal drugs. Some items may interact with your medicine. °What should I watch for while using this medication? °Your condition will be monitored carefully while you are receiving this medication. °You may need blood work done while you are taking this medication. °Do not become pregnant while taking this medication or for 5 months after stopping it. Women should inform their care team if they wish to become pregnant or think they might be pregnant. There is a potential for serious harm to an unborn child. Talk to your care team for more information. Do not breast-feed an infant while taking this medication or for 5 months after stopping it. °What side effects may I notice from receiving this medication? °Side effects that you should report to your care team as soon as possible: °Allergic reactions--skin rash, itching, hives, swelling of the face, lips, tongue, or throat °Bloody or black, tar-like stools °Change in vision °Chest pain °Diarrhea °Dry cough, shortness of breath or trouble breathing °Eye pain °Fast or irregular heartbeat °Fever, chills °High blood sugar (hyperglycemia)--increased thirst or amount of urine, unusual weakness or fatigue, blurry vision °High thyroid levels (hyperthyroidism)--fast or irregular heartbeat, weight loss, excessive sweating or sensitivity to heat, tremors or shaking, anxiety, nervousness, irregular menstrual cycle or spotting °Kidney injury--decrease in the amount of urine, swelling of the ankles, hands, or feet °Liver injury--right upper belly pain, loss of appetite, nausea, light-colored stool, dark yellow or brown urine, yellowing skin or eyes, unusual weakness or fatigue °Low red blood cell count--unusual weakness or fatigue, dizziness, headache, trouble breathing °  Low thyroid levels (hypothyroidism)--unusual weakness or fatigue, increased sensitivity to cold,  constipation, hair loss, dry skin, weight gain, feelings of depression °Mood and behavior changes-confusion, change in sex drive or performance, irritability °Muscle pain or cramps °Pain, tingling, or numbness in the hands or feet, muscle weakness, trouble walking, loss of balance or coordination °Red or dark brown urine °Redness, blistering, peeling, or loosening of the skin, including inside the mouth °Stomach pain °Unusual bruising or bleeding °Side effects that usually do not require medical attention (report to your care team if they continue or are bothersome): °Bone pain °Constipation °Loss of appetite °Nausea °Tiredness °Vomiting °This list may not describe all possible side effects. Call your doctor for medical advice about side effects. You may report side effects to FDA at 1-800-FDA-1088. °Where should I keep my medication? °This medication is given in a hospital or clinic and will not be stored at home. °NOTE: This sheet is a summary. It may not cover all possible information. If you have questions about this medicine, talk to your doctor, pharmacist, or health care provider. °© 2022 [employer] (2021-07-26 00:00:00) ° °

## 2021-10-05 ENCOUNTER — Ambulatory Visit: Payer: 59 | Admitting: Oncology

## 2021-10-06 ENCOUNTER — Encounter (HOSPITAL_BASED_OUTPATIENT_CLINIC_OR_DEPARTMENT_OTHER): Payer: 59 | Attending: Internal Medicine | Admitting: Internal Medicine

## 2021-10-06 ENCOUNTER — Other Ambulatory Visit: Payer: Self-pay

## 2021-10-06 DIAGNOSIS — L98492 Non-pressure chronic ulcer of skin of other sites with fat layer exposed: Secondary | ICD-10-CM | POA: Diagnosis present

## 2021-10-06 DIAGNOSIS — Z87891 Personal history of nicotine dependence: Secondary | ICD-10-CM | POA: Insufficient documentation

## 2021-10-06 DIAGNOSIS — Z8501 Personal history of malignant neoplasm of esophagus: Secondary | ICD-10-CM | POA: Diagnosis not present

## 2021-10-06 DIAGNOSIS — T8131XA Disruption of external operation (surgical) wound, not elsewhere classified, initial encounter: Secondary | ICD-10-CM

## 2021-10-06 DIAGNOSIS — K439 Ventral hernia without obstruction or gangrene: Secondary | ICD-10-CM

## 2021-10-06 NOTE — Progress Notes (Signed)
COADY, TRAIN ([id number]) Visit Report for 10/06/2021 Arrival Information Details Patient Name: Date of Service: MA [name] 10/06/2021 2:00 PM Medical Record Number: [id number] Patient Account Number: [id number] Date of Birth/Sex: Treating RN: 1966-02-28 (55 y.o. M) Primary Care Tonya Wantz: [name] Other Clinician: Referring Eavan Gonterman: Treating Nasean Zapf/Extender: [name] in Treatment: 15 Visit Information History Since Last Visit Added or deleted any medications: No Patient Arrived: Ambulatory Any new allergies or adverse reactions: No Arrival Time: 14:03 Had a fall or experienced change in No Accompanied By: self activities of daily living that may affect Transfer Assistance: None risk of falls: Patient Identification Verified: Yes Signs or symptoms of abuse/neglect since last visito No Secondary Verification Process Completed: Yes Hospitalized since last visit: No Patient Requires Transmission-Based Precautions: No Implantable device outside of the clinic excluding No Patient Has Alerts: No cellular tissue based products placed in the center since last visit: Has Dressing in Place as Prescribed: Yes Pain Present Now: No Electronic Signature(s) Signed: 10/06/2021 4:04:47 PM By: Sandre Kitty Entered By: Sandre Kitty on 10/06/2021 14:04:15 -------------------------------------------------------------------------------- Encounter Discharge Information Details Patient Name: Date of Service: [name] 10/06/2021 2:00 PM Medical Record Number: [id number] Patient Account Number: [id number] Date of Birth/Sex: Treating RN: [birth year] (55 y.o. [name] Primary Care Emonie Espericueta: [name] Other Clinician: Referring Sritha Chauncey: Treating Josephine Wooldridge/Extender: [name] in Treatment: 15 Encounter Discharge Information Items Discharge Condition: Stable Ambulatory Status:  Ambulatory Discharge Destination: Home Transportation: Private Auto Accompanied By: alone Schedule Follow-up Appointment: Yes Clinical Summary of Care: Patient Declined Electronic Signature(s) Signed: 10/06/2021 5:23:58 PM By: Levan Hurst RN, BSN Entered By: Levan Hurst on 10/06/2021 17:17:15 -------------------------------------------------------------------------------- Lower Extremity Assessment Details Patient Name: Date of Service: [name] 10/06/2021 2:00 PM Medical Record Number: [id number] Patient Account Number: [id number] Date of Birth/Sex: Treating RN: 07/17/1966 (55 y.o. [name] Primary Care Genisis Sonnier: [name] Other Clinician: Referring Sewell Pitner: Treating Yazid Pop/Extender: [name] in Treatment: 15 Electronic Signature(s) Signed: 10/06/2021 5:03:41 PM By: Lorrin Jackson Entered By: Lorrin Jackson on 10/06/2021 14:12:05 -------------------------------------------------------------------------------- Multi Wound Chart Details Patient Name: Date of Service: [name] 10/06/2021 2:00 PM Medical Record Number: [id number] Patient Account Number: [id number] Date of Birth/Sex: Treating RN: 12/08/1965 (55 y.o. M) Primary Care Alexzandra Bilton: [name] Other Clinician: Referring Darold Miley: Treating Joanmarie Tsang/Extender: [name] in Treatment: 15 Vital Signs Height(in): 69 Pulse(bpm): 62 Weight(lbs): 191 Blood Pressure(mmHg): 124/78 Body Mass Index(BMI): 28 Temperature(F): 98.7 Respiratory Rate(breaths/min): 17 Photos: [N/A:N/A] Abdomen - midline N/A N/A Wound Location: Surgical Injury N/A N/A Wounding Event: Open Surgical Wound N/A N/A Primary Etiology: Received Chemotherapy, Received N/A N/A Comorbid History: Radiation 09/30/2020 N/A N/A Date Acquired: 15 N/A N/A Weeks of Treatment: Open N/A N/A Wound Status: 5.8x4.5x0.1 N/A N/A Measurements L x W  x D (cm) 20.499 N/A N/A A (cm) : rea 2.05 N/A N/A Volume (cm) : 60.60% N/A N/A % Reduction in Area: 60.60% N/A N/A % Reduction in Volume: Full Thickness Without Exposed N/A N/A Classification: Support Structures Medium N/A N/A Exudate Amount: Serosanguineous N/A N/A Exudate Type: red, brown N/A N/A Exudate Color: Distinct, outline attached N/A N/A Wound Margin: Large (67-100%) N/A N/A Granulation Amount: Red N/A N/A Granulation Quality: None Present (0%) N/A N/A Necrotic Amount: Fat Layer (Subcutaneous Tissue): Yes N/A N/A Exposed Structures: Fascia: No Tendon: No Muscle: No Joint: No Bone: No Small (1-33%) N/A N/A Epithelialization: Chemical Cauterization N/A N/A Procedures  Performed: Treatment Notes Electronic Signature(s) Signed: 10/06/2021 2:55:44 PM By: [name] DO Entered By: [name] on 10/06/2021 14:52:14 -------------------------------------------------------------------------------- [place] Details Patient Name: Date of Service: [name] 10/06/2021 2:00 PM Medical Record Number: [id number] Patient Account Number: [id number] Date of Birth/Sex: Treating RN: 1966/02/25 (55 y.o. [name] Primary Care Travonta Gill: [name] Other Clinician: Referring Manuela Halbur: Treating Ramsey Midgett/Extender: [name] in Treatment: [place] reviewed with physician Active Inactive Wound/Skin Impairment Nursing Diagnoses: Impaired tissue integrity Knowledge deficit related to ulceration/compromised skin integrity Goals: Patient/caregiver will verbalize understanding of skin care regimen Date Initiated: 06/22/2021 Target Resolution Date: 11/10/2021 Goal Status: Active Ulcer/skin breakdown will have a volume reduction of 30% by week 4 Date Initiated: 06/22/2021 Date Inactivated: 08/03/2021 Target Resolution Date: 07/20/2021 Goal Status: Met Ulcer/skin  breakdown will have a volume reduction of 50% by week 8 Date Initiated: 08/03/2021 Date Inactivated: 08/24/2021 Target Resolution Date: 08/17/2021 Unmet Reason: 30% both volume Goal Status: Unmet reduction. wound has made improvement see wound chart. Interventions: Assess patient/caregiver ability to obtain necessary supplies Assess patient/caregiver ability to perform ulcer/skin care regimen upon admission and as needed Assess ulceration(s) every visit Provide education on ulcer and skin care Treatment Activities: Skin care regimen initiated : 06/22/2021 Topical wound management initiated : 06/22/2021 Notes: Electronic Signature(s) Signed: 10/06/2021 5:03:41 PM By: Lorrin Jackson Entered By: Lorrin Jackson on 10/06/2021 14:14:08 -------------------------------------------------------------------------------- Pain Assessment Details Patient Name: Date of Service: Salli Real K. 10/06/2021 2:00 PM Medical Record Number: [id number] Patient Account Number: [id number] Date of Birth/Sex: Treating RN: [birth year] (55 y.o. M) Primary Care Daiveon Markman: [name] Other Clinician: Referring Ravis Herne: Treating Yael Coppess/Extender: [name] in Treatment: 15 Active Problems Location of Pain Severity and Description of Pain Patient Has Paino No Site Locations Pain Management and Medication Current Pain Management: Electronic Signature(s) Signed: 10/06/2021 4:04:47 PM By: Sandre Kitty Entered By: Sandre Kitty on 10/06/2021 14:04:41 -------------------------------------------------------------------------------- Patient/Caregiver Education Details Patient Name: Date of Service: MA [name] 11/17/2022andnbsp2:00 PM Medical Record Number: [id number] Patient Account Number: [id number] Date of Birth/Gender: Treating RN: [birth year] (55 y.o. [name] Primary Care Physician: [name] Other Clinician: Referring  Physician: Treating Physician/Extender: [name] in Treatment: 15 Education Assessment Education Provided To: Patient Education Topics Provided Wound/Skin Impairment: Methods: Explain/Verbal, Printed Responses: State content correctly [employer]) Signed: 10/06/2021 5:03:41 PM By: Lorrin Jackson Entered By: Lorrin Jackson on 10/06/2021 14:14:23 -------------------------------------------------------------------------------- Wound Assessment Details Patient Name: Date of Service: [name] 10/06/2021 2:00 PM Medical Record Number: [id number] Patient Account Number: [id number] Date of Birth/Sex: Treating RN: [birth year] (55 y.o. M) Primary Care Patty Leitzke: [name] Other Clinician: Referring Amberly Livas: Treating Melven Stockard/Extender: [name] in Treatment: 15 Wound Status Wound Number: 1 Primary Etiology: Open Surgical Wound Wound Location: Abdomen - midline Wound Status: Open Wounding Event: Surgical Injury Comorbid History: Received Chemotherapy, Received Radiation Date Acquired: 09/30/2020 Weeks Of Treatment: 15 Clustered Wound: No Photos Wound Measurements Length: (cm) 5.8 Width: (cm) 4.5 Depth: (cm) 0.1 Area: (cm) 20.499 Volume: (cm) 2.05 % Reduction in Area: 60.6% % Reduction in Volume: 60.6% Epithelialization: Small (1-33%) Tunneling: No Undermining: No Wound Description Classification: Full Thickness Without Exposed Support Structures Wound Margin: Distinct, outline attached Exudate Amount: Medium Exudate Type: Serosanguineous Exudate Color: red, brown Foul Odor After Cleansing: No Slough/Fibrino No Wound Bed Granulation Amount: Large (67-100%) Exposed Structure Granulation Quality: Red Fascia Exposed: No Necrotic Amount: None Present (  0%) Fat Layer (Subcutaneous Tissue) Exposed: Yes Tendon Exposed: No Muscle Exposed: No Joint Exposed: No Bone Exposed:  No Treatment Notes Wound #1 (Abdomen - midline) Cleanser Soap and Water Discharge Instruction: May shower and wash wound with dial antibacterial soap and water prior to dressing change. Wound Cleanser Discharge Instruction: Cleanse the wound with wound cleanser prior to applying a clean dressing using gauze sponges, not tissue or cotton balls. Peri-Wound Care Topical Primary Dressing Hydrofera Blue Ready Foam, 4x5 in Discharge Instruction: Apply to wound bed as instructed Secondary Dressing ABD Pad, 8x10 Discharge Instruction: Apply over primary dressing as directed. Secured With 22M Medipore H Soft Cloth Surgical T 4 x 2 (in/yd) ape Discharge Instruction: Secure dressing with tape as directed. Compression Wrap Compression Stockings Add-Ons Electronic Signature(s) Signed: 10/06/2021 5:03:41 PM By: Lorrin Jackson Entered By: Lorrin Jackson on 10/06/2021 14:13:08 -------------------------------------------------------------------------------- Vitals Details Patient Name: Date of Service: [name] 10/06/2021 2:00 PM Medical Record Number: [id number] Patient Account Number: [id number] Date of Birth/Sex: Treating RN: 24-Sep-1966 (55 y.o. M) Primary Care Robley Matassa: [name] Other Clinician: Referring Teralyn Mullins: Treating Arta Stump/Extender: [name] in Treatment: 15 Vital Signs Time Taken: 14:04 Temperature (F): 98.7 Height (in): 69 Pulse (bpm): 82 Weight (lbs): 191 Respiratory Rate (breaths/min): 17 Body Mass Index (BMI): 28.2 Blood Pressure (mmHg): 124/78 Reference Range: 80 - 120 mg / dl Electronic Signature(s) Signed: 10/06/2021 4:04:47 PM By: Sandre Kitty Entered By: Sandre Kitty on 10/06/2021 14:04:33

## 2021-10-07 NOTE — Progress Notes (Signed)
Thomas Gonzalez, Thomas Gonzalez ([id number]) Visit Report for 10/06/2021 Chief Complaint Document Details Patient Name: Date of Service: Thomas [name] 10/06/2021 2:00 PM Medical Record Number: [id number] Patient Account Number: [id number] Date of Birth/Sex: Treating RN: 1966/05/29 (55 y.o. M) Primary Care Provider: [name] Other Clinician: Referring Provider: Treating Provider/Extender: [name] in Treatment: 15 Information Obtained from: Patient Chief Complaint Surgical Ulcer Electronic Signature(s) Signed: 10/06/2021 2:55:44 PM By: [name] DO Entered By: [name] on 10/06/2021 14:52:24 -------------------------------------------------------------------------------- HPI Details Patient Name: Date of Service: Thomas Laurena Bering K. 10/06/2021 2:00 PM Medical Record Number: [id number] Patient Account Number: [id number] Date of Birth/Sex: Treating RN: Apr 15, 1966 (55 y.o. M) Primary Care Provider: [name] Other Clinician: Referring Provider: Treating Provider/Extender: [name] in Treatment: 15 History of Present Illness HPI Description: 06/22/2021 patient presents today as a pleasant gentleman who unfortunately has been having quite a bit of an issue with his abdominal area following surgery in 2021 November. 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 Subsequently he also was on immunotherapy following to try to help with any residual carcinoma following the procedure. Nonetheless he had an issue with the incision site in the mid abdomen and unfortunately this ended up dehiscing and has had trouble since getting this healed. He has been seen by Dr. [name] him where ACell was utilized but again there is really no response she referred the patient to [place] for further evaluation and treatment. No chemical cauterization with silver nitrate has been utilized. He also has not had  any he tells me aggressive sharp debridement of the area to clear away some of this hypergranular tissue which he has significant amounts noted. Either way I think that we will get a need to try to see about removing this I do think, to send sample of this to biopsy just to make sure there is no signs of cancer noted although I really think that is probably not can be the case I think we will get a get a report back stating hypergranulation tissue. With that being said the patient does seem to have quite a bit of bleeding and friability even with wiping over the wound area and again I think this is a big part of the issue again he can even grow new skin because anything attaching is attaching to the hypergranulation which is not even get a be anywhere close to sufficient for good epithelial growth. He also has had a CT scan in April which showed there was post esophagectomy with gastric pull-up anatomy but no evidence of local recurrence. There is no evidence of mediastinal nodal metastasis. He did have small bilateral pulmonary nodules which were unchanged recommend routine surveillance. Otherwise the patient has a ventral hernia following the surgery but no evidence of obstruction or gangrene. 06/28/2021 upon evaluation today patient's wound actually appears to be doing quite a bit better and it is hypergranular tissue is significantly improved compared to last time. With that being said they had a hard time getting the [facility] I am not sure exactly what all happened but the good news is that is something that should be on the way and he should have that if not today then tomorrow. Overall I think that with the [facility] this would have looked even better but nonetheless I am still pleased with what I am seeing today. 07/06/2021 upon evaluation today patient's wound is actually showing signs of improvement and  in general I feel like he is making excellent progress. The Hydrofera Blue dressing  to be doing excellent for him it does cause a little bit of bleeding when he removes it during dressing changes but other than that he is doing quite well based on what I am seeing. Fortunately there does not appear to be any signs of active infection at this time. No fevers, chills, nausea, vomiting, or diarrhea. 07/13/2021 upon evaluation today patient appears to be doing well with regard to his abdominal ulcer. Overall I am extremely pleased with where things stand I think he is making good progress. I do think the Hydrofera Blue is doing a great job as far as treating this is concerned. I do believe that as long as he is seeing improvement we should continue as such. We may be able to contemplate some other options such as skin substitutes but right now when see how we do with this. 08/03/2021 upon evaluation today patient appears to be doing well with regard to his abdominal ulcer. Week by week this is showing signs of improvement which is good news. Overall the surface of the wound is also greatly improved. I am extremely pleased with where we stand today. 08/24/2021 upon evaluation today patient's wound on the abdominal area appears to be doing excellent today. Fortunately there is no signs of active infection and very pleased in that regard. There does not appear to be any signs of active infection systemically either which is also good news. No fevers, chills, nausea, vomiting, or diarrhea. Upon inspection patient's wound bed showed signs of good granulation epithelization at this point. Fortunately there does not appear to be any signs of active infection at this time which is great news. 10/27; patient presents for 3-week follow-up. He has been using Hydrofera Blue to the wound bed without issues. He has no complaints today. He denies signs of infection. 11/17; patient presents for 3-week follow-up. He has been doing well with Hydrofera Blue dressing changes. He has no issues or complaints  today. He denies signs of infection. Electronic Signature(s) Signed: 10/06/2021 2:55:44 PM By: [name] DO Entered By: [name] on 10/06/2021 14:52:56 -------------------------------------------------------------------------------- Chemical Cauterization Details Patient Name: Date of Service: Thomas Laurena Bering K. 10/06/2021 2:00 PM Medical Record Number: [id number] Patient Account Number: [id number] Date of Birth/Sex: Treating RN: 14-Mar-1966 (55 y.o. [name] Primary Care Provider: [name] Other Clinician: Referring Provider: Treating Provider/Extender: [name] in Treatment: 15 Procedure Performed for: Wound #1 Abdomen - midline Performed By: Physician [name], DO Post Procedure Diagnosis Same as Pre-procedure Notes silver nitrate Electronic Signature(s) Signed: 10/06/2021 2:55:44 PM By: [name] DO Signed: 10/06/2021 5:23:58 PM By: Levan Hurst RN, BSN Entered By: Levan Hurst on 10/06/2021 14:50:29 -------------------------------------------------------------------------------- Physical Exam Details Patient Name: Date of Service: Thomas [name]. 10/06/2021 2:00 PM Medical Record Number: [id number] Patient Account Number: [id number] Date of Birth/Sex: Treating RN: 17-Feb-1966 (55 y.o. M) Primary Care Provider: [name] Other Clinician: Referring Provider: Treating Provider/Extender: [name] in Treatment: 15 Constitutional respirations regular, non-labored and within target range for patient.Marland Kitchen Psychiatric pleasant and cooperative. Notes Large open wound to the abdomen with hyper granulated tissue and epithelialization circumferentially to the edges. No signs of infection. Appears well-healing. Electronic Signature(s) Signed: 10/06/2021 2:55:44 PM By: [name] DO Entered By: [name] on 10/06/2021  14:54:59 -------------------------------------------------------------------------------- Physician Orders Details Patient Name: Date of Service: Thomas Laurena Bering K. 10/06/2021 2:00  PM Medical Record Number: [id number] Patient Account Number: [id number] Date of Birth/Sex: Treating RN: 07/03/1966 (55 y.o. [name] Primary Care Provider: [name] Other Clinician: Referring Provider: Treating Provider/Extender: [name] in Treatment: 15 Verbal / Phone Orders: No Diagnosis Coding ICD-10 Coding Code Description T81.31XA Disruption of external operation (surgical) wound, not elsewhere classified, initial encounter L98.492 Non-pressure chronic ulcer of skin of other sites with fat layer exposed Z85.01 Personal history of malignant neoplasm of esophagus K43.9 Ventral hernia without obstruction or gangrene Follow-up Appointments Return appointment in 3 weeks. - Dr. Heber [place] Bathing/ Shower/ Hygiene May shower and wash wound with soap and water. - prior to dressing change Additional Orders / Instructions Follow Nutritious Diet - Increase protein Wound Treatment Wound #1 - Abdomen - midline Cleanser: Soap and Water Every Other Day/Other:90 days Discharge Instructions: May shower and wash wound with dial antibacterial soap and water prior to dressing change. Cleanser: Wound Cleanser Every Other Day/Other:90 days Discharge Instructions: Cleanse the wound with wound cleanser prior to applying a clean dressing using gauze sponges, not tissue or cotton balls. Prim Dressing: Hydrofera Blue Ready Foam, 4x5 in Every Other Day/Other:90 days ary Discharge Instructions: Apply to wound bed as instructed Secondary Dressing: ABD Pad, 8x10 Every Other Day/Other:90 days Discharge Instructions: Apply over primary dressing as directed. Secured With: 4M Medipore H Soft Cloth Surgical T 4 x 2 (in/yd) Every Other Day/Other:90 days ape Discharge Instructions:  Secure dressing with tape as directed. Electronic Signature(s) Signed: 10/06/2021 2:55:44 PM By: [name] DO Entered By: [name] on 10/06/2021 14:54:02 -------------------------------------------------------------------------------- Problem List Details Patient Name: Date of Service: Thomas Laurena Bering K. 10/06/2021 2:00 PM Medical Record Number: [id number] Patient Account Number: [id number] Date of Birth/Sex: Treating RN: 27-Dec-1965 (55 y.o. [name] Primary Care Provider: [name] Other Clinician: Referring Provider: Treating Provider/Extender: [name] in Treatment: 15 Active Problems ICD-10 Encounter Code Description Active Date MDM Diagnosis T81.31XA Disruption of external operation (surgical) wound, not elsewhere classified, 06/22/2021 No Yes initial encounter L98.492 Non-pressure chronic ulcer of skin of other sites with fat layer exposed 06/22/2021 No Yes Z85.01 Personal history of malignant neoplasm of esophagus 06/22/2021 No Yes K43.9 Ventral hernia without obstruction or gangrene 06/22/2021 No Yes Inactive Problems Resolved Problems Electronic Signature(s) Signed: 10/06/2021 2:55:44 PM By: [name] DO Entered By: [name] on 10/06/2021 14:52:08 -------------------------------------------------------------------------------- Progress Note Details Patient Name: Date of Service: Thomas Laurena Bering K. 10/06/2021 2:00 PM Medical Record Number: [id number] Patient Account Number: [id number] Date of Birth/Sex: Treating RN: [birth year] (55 y.o. M) Primary Care Provider: [name] Other Clinician: Referring Provider: Treating Provider/Extender: [name] in Treatment: 15 Subjective Chief Complaint Information obtained from Patient Surgical Ulcer History of Present Illness (HPI) 06/22/2021 patient presents today as a pleasant gentleman who unfortunately has been having  quite a bit of an issue with his abdominal area following surgery in 2021 November. 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 Subsequently he also was on immunotherapy following to try to help with any residual carcinoma following the procedure. Nonetheless he had an issue with the incision site in the mid abdomen and unfortunately this ended up dehiscing and has had trouble since getting this healed. He has been seen by Dr. [name] him where ACell was utilized but again there is really no response she referred the patient to [place] for further evaluation and treatment. No chemical cauterization with silver nitrate has been utilized.  He also has not had any he tells me aggressive sharp debridement of the area to clear away some of this hypergranular tissue which he has significant amounts noted. Either way I think that we will get a need to try to see about removing this I do think, to send sample of this to biopsy just to make sure there is no signs of cancer noted although I really think that is probably not can be the case I think we will get a get a report back stating hypergranulation tissue. With that being said the patient does seem to have quite a bit of bleeding and friability even with wiping over the wound area and again I think this is a big part of the issue again he can even grow new skin because anything attaching is attaching to the hypergranulation which is not even get a be anywhere close to sufficient for good epithelial growth. He also has had a CT scan in April which showed there was post esophagectomy with gastric pull-up anatomy but no evidence of local recurrence. There is no evidence of mediastinal nodal metastasis. He did have small bilateral pulmonary nodules which were unchanged recommend routine surveillance. Otherwise the patient has a ventral hernia following the surgery but no evidence of obstruction or gangrene. 06/28/2021 upon evaluation today  patient's wound actually appears to be doing quite a bit better and it is hypergranular tissue is significantly improved compared to last time. With that being said they had a hard time getting the [facility] I am not sure exactly what all happened but the good news is that is something that should be on the way and he should have that if not today then tomorrow. Overall I think that with the [facility] this would have looked even better but nonetheless I am still pleased with what I am seeing today. 07/06/2021 upon evaluation today patient's wound is actually showing signs of improvement and in general I feel like he is making excellent progress. The Hydrofera Blue dressing to be doing excellent for him it does cause a little bit of bleeding when he removes it during dressing changes but other than that he is doing quite well based on what I am seeing. Fortunately there does not appear to be any signs of active infection at this time. No fevers, chills, nausea, vomiting, or diarrhea. 07/13/2021 upon evaluation today patient appears to be doing well with regard to his abdominal ulcer. Overall I am extremely pleased with where things stand I think he is making good progress. I do think the Hydrofera Blue is doing a great job as far as treating this is concerned. I do believe that as long as he is seeing improvement we should continue as such. We may be able to contemplate some other options such as skin substitutes but right now when see how we do with this. 08/03/2021 upon evaluation today patient appears to be doing well with regard to his abdominal ulcer. Week by week this is showing signs of improvement which is good news. Overall the surface of the wound is also greatly improved. I am extremely pleased with where we stand today. 08/24/2021 upon evaluation today patient's wound on the abdominal area appears to be doing excellent today. Fortunately there is no signs of active infection and very  pleased in that regard. There does not appear to be any signs of active infection systemically either which is also good news. No fevers, chills, nausea, vomiting, or diarrhea. Upon inspection  patient's wound bed showed signs of good granulation epithelization at this point. Fortunately there does not appear to be any signs of active infection at this time which is great news. 10/27; patient presents for 3-week follow-up. He has been using Hydrofera Blue to the wound bed without issues. He has no complaints today. He denies signs of infection. 11/17; patient presents for 3-week follow-up. He has been doing well with Hydrofera Blue dressing changes. He has no issues or complaints today. He denies signs of infection. Patient History Information obtained from Patient. Family History Cancer - Siblings, No family history of Diabetes, Heart Disease, Hereditary Spherocytosis, Hypertension, Kidney Disease, Lung Disease, Seizures, Stroke, Thyroid Problems, Tuberculosis. Social History Former smoker, Marital Status - Married, Alcohol Use - Never, Drug Use - No History, Caffeine Use - Daily. Medical History Oncologic Patient has history of Received Chemotherapy - 09/20/20, Received Radiation Medical A Surgical History Notes nd Gastrointestinal esophageal carcinoma Objective Constitutional respirations regular, non-labored and within target range for patient.. Vitals Time Taken: 2:04 PM, Height: 69 in, Weight: 191 lbs, BMI: 28.2, Temperature: 98.7 F, Pulse: 82 bpm, Respiratory Rate: 17 breaths/min, Blood Pressure: 124/78 mmHg. Psychiatric pleasant and cooperative. General Notes: Large open wound to the abdomen with hyper granulated tissue and epithelialization circumferentially to the edges. No signs of infection. Appears well-healing. Integumentary (Hair, Skin) Wound #1 status is Open. Original cause of wound was Surgical Injury. The date acquired was: 09/30/2020. The wound has been in  treatment 15 weeks. The wound is located on the Abdomen - midline. The wound measures 5.8cm length x 4.5cm width x 0.1cm depth; 20.499cm^2 area and 2.05cm^3 volume. There is Fat Layer (Subcutaneous Tissue) exposed. There is no tunneling or undermining noted. There is a medium amount of serosanguineous drainage noted. The wound margin is distinct with the outline attached to the wound base. There is large (67-100%) red granulation within the wound bed. There is no necrotic tissue within the wound bed. Assessment Active Problems ICD-10 Disruption of external operation (surgical) wound, not elsewhere classified, initial encounter Non-pressure chronic ulcer of skin of other sites with fat layer exposed Personal history of malignant neoplasm of esophagus Ventral hernia without obstruction or gangrene Patient's wound has shown improvement in size and appearance since last clinic visit. He is doing well with Hydrofera Blue and I recommended continuing this. I used silver nitrate to the hyper granulated areas. No signs of infection. Patient would like to follow-up every 3 weeks. He knows to call with any questions or concerns and can be seen sooner Procedures Wound #1 Pre-procedure diagnosis of Wound #1 is an Open Surgical Wound located on the Abdomen - midline . An Chemical Cauterization procedure was performed by [name], DO. Post procedure Diagnosis Wound #1: Same as Pre-Procedure Notes: silver nitrate Plan Follow-up Appointments: Return appointment in 3 weeks. - Dr. Heber [place] Bathing/ Shower/ Hygiene: May shower and wash wound with soap and water. - prior to dressing change Additional Orders / Instructions: Follow Nutritious Diet - Increase protein WOUND #1: - Abdomen - midline Wound Laterality: Cleanser: Soap and Water Every Other Day/Other:90 days Discharge Instructions: May shower and wash wound with dial antibacterial soap and water prior to dressing change. Cleanser: Wound  Cleanser Every Other Day/Other:90 days Discharge Instructions: Cleanse the wound with wound cleanser prior to applying a clean dressing using gauze sponges, not tissue or cotton balls. Prim Dressing: Hydrofera Blue Ready Foam, 4x5 in Every Other Day/Other:90 days ary Discharge Instructions: Apply to wound bed as instructed Secondary Dressing:  ABD Pad, 8x10 Every Other Day/Other:90 days Discharge Instructions: Apply over primary dressing as directed. Secured With: 43M Medipore H Soft Cloth Surgical T 4 x 2 (in/yd) Every Other Day/Other:90 days ape Discharge Instructions: Secure dressing with tape as directed. 1. Hydrofera Blue 2. Follow-up in 3 weeks Electronic Signature(s) Signed: 10/06/2021 2:55:44 PM By: [name] DO Entered By: [name] on 10/06/2021 14:55:15 -------------------------------------------------------------------------------- HxROS Details Patient Name: Date of Service: Thomas Laurena Bering K. 10/06/2021 2:00 PM Medical Record Number: [id number] Patient Account Number: [id number] Date of Birth/Sex: Treating RN: 04-Aug-1966 (55 y.o. M) Primary Care Provider: [name] Other Clinician: Referring Provider: Treating Provider/Extender: [name] in Treatment: 15 Information Obtained From Patient Gastrointestinal Medical History: Past Medical History Notes: esophageal carcinoma Oncologic Medical History: Positive for: Received Chemotherapy - 09/20/20; Received Radiation Immunizations Pneumococcal Vaccine: Received Pneumococcal Vaccination: No Implantable Devices Yes Family and Social History Cancer: Yes - Siblings; Diabetes: No; Heart Disease: No; Hereditary Spherocytosis: No; Hypertension: No; Kidney Disease: No; Lung Disease: No; Seizures: No; Stroke: No; Thyroid Problems: No; Tuberculosis: No; Former smoker; Marital Status - Married; Alcohol Use: Never; Drug Use: No History; Caffeine Use: Daily; Financial Concerns: No;  Food, Clothing or Shelter Needs: No; Support System Lacking: No; Transportation Concerns: No Electronic Signature(s) Signed: 10/06/2021 2:55:44 PM By: [name] DO Entered By: [name] on 10/06/2021 14:53:11 -------------------------------------------------------------------------------- SuperBill Details Patient Name: Date of Service: Thomas [name]. 10/06/2021 Medical Record Number: [id number] Patient Account Number: [id number] Date of Birth/Sex: Treating RN: [birth year] (55 y.o. M) Primary Care Provider: [name] Other Clinician: Referring Provider: Treating Provider/Extender: [name] in Treatment: 15 Diagnosis Coding ICD-10 Codes Code Description T81.31XA Disruption of external operation (surgical) wound, not elsewhere classified, initial encounter L98.492 Non-pressure chronic ulcer of skin of other sites with fat layer exposed Z85.01 Personal history of malignant neoplasm of esophagus K43.9 Ventral hernia without obstruction or gangrene Facility Procedures CPT4 Code: [id number] Description: 19622 - CHEM CAUT GRANULATION TISS ICD-10 Diagnosis Description L98.492 Non-pressure chronic ulcer of skin of other sites with fat layer exposed Modifier: Quantity: 1 Physician Procedures : CPT4 Code Description Modifier [id number] - WC PHYS LEVEL 3 - EST PT ICD-10 Diagnosis Description T81.31XA Disruption of external operation (surgical) wound, not elsewhere classified, initial encounter L98.492 Non-pressure chronic ulcer of skin of  other sites with fat layer exposed Z85.01 Personal history of malignant neoplasm of esophagus K43.9 Ventral hernia without obstruction or gangrene Quantity: 1 : [id number] - WC PHYS CHEM CAUT GRAN TISSUE ICD-10 Diagnosis Description L98.492 Non-pressure chronic ulcer of skin of other sites with fat layer exposed Quantity: 1 Electronic Signature(s) Signed: 10/06/2021 5:23:58 PM By: Levan Hurst RN,  BSN Signed: 10/07/2021 9:16:29 AM By: [name] DO Previous Signature: 10/06/2021 2:55:44 PM Version By: [name] DO Entered By: Levan Hurst on 10/06/2021 17:16:40

## 2021-10-10 ENCOUNTER — Other Ambulatory Visit: Payer: Self-pay | Admitting: Hematology and Oncology

## 2021-10-27 ENCOUNTER — Encounter (HOSPITAL_BASED_OUTPATIENT_CLINIC_OR_DEPARTMENT_OTHER): Payer: 59 | Attending: Internal Medicine | Admitting: Internal Medicine

## 2021-10-27 ENCOUNTER — Other Ambulatory Visit: Payer: Self-pay

## 2021-10-27 DIAGNOSIS — K439 Ventral hernia without obstruction or gangrene: Secondary | ICD-10-CM | POA: Insufficient documentation

## 2021-10-27 DIAGNOSIS — X58XXXA Exposure to other specified factors, initial encounter: Secondary | ICD-10-CM | POA: Diagnosis not present

## 2021-10-27 DIAGNOSIS — T8131XA Disruption of external operation (surgical) wound, not elsewhere classified, initial encounter: Secondary | ICD-10-CM | POA: Insufficient documentation

## 2021-10-27 DIAGNOSIS — L98492 Non-pressure chronic ulcer of skin of other sites with fat layer exposed: Secondary | ICD-10-CM | POA: Insufficient documentation

## 2021-10-27 DIAGNOSIS — Z8501 Personal history of malignant neoplasm of esophagus: Secondary | ICD-10-CM | POA: Insufficient documentation

## 2021-10-27 DIAGNOSIS — Z87891 Personal history of nicotine dependence: Secondary | ICD-10-CM | POA: Insufficient documentation

## 2021-10-27 NOTE — Progress Notes (Signed)
AYDN, FERRARA ([id number]) Visit Report for 10/27/2021 Arrival Information Details Patient Name: Date of Service: [name] 10/27/2021 2:30 PM Medical Record Number: [id number] Patient Account Number: [id number] Date of Birth/Sex: Treating RN: 1966-11-19 (55 y.o. Thomas Gonzalez Primary Care Thomas Gonzalez: Thomas Gonzalez Other Clinician: Referring Thomas Gonzalez: Treating Thomas Gonzalez/Extender: Thomas Gonzalez in Treatment: 18 Visit Information History Since Last Visit Added or deleted any medications: No Patient Arrived: Ambulatory Any new allergies or adverse reactions: No Arrival Time: 14:20 Had a fall or experienced change in No Transfer Assistance: None activities of daily living that may affect Patient Identification Verified: Yes risk of falls: Secondary Verification Process Completed: Yes Signs or symptoms of abuse/neglect since last visito No Patient Requires Transmission-Based Precautions: No Hospitalized since last visit: No Patient Has Alerts: No Implantable device outside of the clinic excluding No cellular tissue based products placed in the center since last visit: Has Dressing in Place as Prescribed: Yes Pain Present Now: No Electronic Signature(s) Signed: 10/27/2021 5:07:57 PM By: Thomas Gonzalez Entered By: Thomas Gonzalez on 10/27/2021 14:22:52 -------------------------------------------------------------------------------- Clinic Level of Care Assessment Details Patient Name: Date of Service: [name] 10/27/2021 2:30 PM Medical Record Number: [id number] Patient Account Number: [id number] Date of Birth/Sex: Treating RN: 1965/12/23 (55 y.o. Thomas Gonzalez Primary Care Thomas Gonzalez: Thomas Gonzalez Other Clinician: Referring Thomas Gonzalez: Treating Thomas Gonzalez/Extender: Thomas Gonzalez in Treatment: 18 Clinic Level of Care Assessment Items TOOL 4 Quantity Score X- 1 0 Use when only an EandM is performed on  FOLLOW-UP visit ASSESSMENTS - Nursing Assessment / Reassessment X- 1 10 Reassessment of Co-morbidities (includes updates in patient status) X- 1 5 Reassessment of Adherence to Treatment Plan ASSESSMENTS - Wound and Skin A ssessment / Reassessment X - Simple Wound Assessment / Reassessment - one wound 1 5 _0  - 0 Complex Wound Assessment / Reassessment - multiple wounds _1  - 0 Dermatologic / Skin Assessment (not related to wound area) ASSESSMENTS - Focused Assessment _2  - 0 Circumferential Edema Measurements - multi extremities _3  - 0 Nutritional Assessment / Counseling / Intervention _4  - 0 Lower Extremity Assessment (monofilament, tuning fork, pulses) _5  - 0 Peripheral Arterial Disease Assessment (using hand held doppler) ASSESSMENTS - Ostomy and/or Continence Assessment and Care _6  - 0 Incontinence Assessment and Management _7  - 0 Ostomy Care Assessment and Management (repouching, etc.) PROCESS - Coordination of Care _8  - 0 Simple Patient / Family Education for ongoing care X- 1 20 Complex (extensive) Patient / Family Education for ongoing care _9  - 0 Staff obtains Programmer, systems, Records, T Results / Process Orders est _10  - 0 Staff telephones HHA, Nursing Homes / Clarify orders / etc _11  - 0 Routine Transfer to another Facility (non-emergent condition) _12  - 0 Routine Hospital Admission (non-emergent condition) _13  - 0 New Admissions / Biomedical engineer / Ordering NPWT Apligraf, etc. , _14  - 0 Emergency Hospital Admission (emergent condition) _15  - 0 Simple Discharge Coordination _16  - 0 Complex (extensive) Discharge Coordination PROCESS - Special Needs _17  - 0 Pediatric / Minor Patient Management _18  - 0 Isolation Patient Management _19  - 0 Hearing / Language / Visual special needs _20  - 0 Assessment of Community assistance (transportation, D/C planning, etc.) _21  - 0 Additional assistance / Altered mentation _22  - 0 Support Surface(s) Assessment (bed, cushion,  seat, etc.) INTERVENTIONS - Wound Cleansing / Measurement X - Simple Wound Cleansing - one wound 1 5 _23  - 0 Complex Wound Cleansing - multiple wounds X- 1 5 Wound  Imaging (photographs - any number of wounds) _0  - 0 Wound Tracing (instead of photographs) X- 1 5 Simple Wound Measurement - one wound _1  - 0 Complex Wound Measurement - multiple wounds INTERVENTIONS - Wound Dressings _2  - 0 Small Wound Dressing one or multiple wounds _3  - 0 Medium Wound Dressing one or multiple wounds _4  - 0 Large Wound Dressing one or multiple wounds <ERXVQMGQQPYPPJKD>_3<\/OIZTIWPYKDXIPJAS>_5  - 0 Application of Medications - topical <KNLZJQBHALPFXTKW>_4<\/OXBDZHGDJMEQASTM>_1  - 0 Application of Medications - injection INTERVENTIONS - Miscellaneous _7  - 0 External ear exam _8  - 0 Specimen Collection (cultures, biopsies, blood, body fluids, etc.) _9  - 0 Specimen(s) / Culture(s) sent or taken to Lab for analysis _10  - 0 Patient Transfer (multiple staff / Civil Service fast streamer / Similar devices) _11  - 0 Simple Staple / Suture removal (25 or less) _12  - 0 Complex Staple / Suture removal (26 or more) _13  - 0 Hypo / Hyperglycemic Management (close monitor of Blood Glucose) _14  - 0 Ankle / Brachial Index (ABI) - do not check if billed separately X- 1 5 Vital Signs Has the patient been seen at the hospital within the last three years: Yes Total Score: 60 Level Of Care: New/Established - Level 2 Electronic Signature(s) Signed: 10/27/2021 5:07:57 PM By: Thomas Gonzalez Entered By: Thomas Gonzalez on 10/27/2021 14:31:14 -------------------------------------------------------------------------------- Encounter Discharge Information Details Patient Name: Date of Service: [name] 10/27/2021 2:30 PM Medical Record Number: [id number] Patient Account Number: [id number] Date of Birth/Sex: Treating RN: 05-Jul-1966 (55 y.o. Thomas Gonzalez Primary Care Dalonte Hardage: Thomas Gonzalez Other Clinician: Referring Armando Lauman: Treating Katalia Choma/Extender: Thomas Gonzalez in  Treatment: 18 Encounter Discharge Information Items Discharge Condition: Stable Ambulatory Status: Ambulatory Discharge Destination: Home Transportation: Private Auto Schedule Follow-up Appointment: Yes Clinical Summary of Care: Provided on 10/27/2021 Form Type Recipient Paper Patient Patient Electronic Signature(s) Signed: 10/27/2021 5:07:57 PM By: Thomas Gonzalez Entered By: Thomas Gonzalez on 10/27/2021 14:38:40 -------------------------------------------------------------------------------- Lower Extremity Assessment Details Patient Name: Date of Service: [name] 10/27/2021 2:30 PM Medical Record Number: [id number] Patient Account Number: [id number] Date of Birth/Sex: Treating RN: [birth year] (55 y.o. Thomas Gonzalez Primary Care Abbegail Matuska: Thomas Gonzalez Other Clinician: Referring Heavenlee Maiorana: Treating Clevland Cork/Extender: Thomas Gonzalez in Treatment: 18 Electronic Signature(s) Signed: 10/27/2021 5:07:57 PM By: Thomas Gonzalez Entered By: Thomas Gonzalez on 10/27/2021 14:25:05 -------------------------------------------------------------------------------- Multi Wound Chart Details Patient Name: Date of Service: [name] 10/27/2021 2:30 PM Medical Record Number: [id number] Patient Account Number: [id number] Date of Birth/Sex: Treating RN: September 29, 1966 (55 y.o. Thomas Gonzalez, Thomas Gonzalez Primary Care Juanell Saffo: Thomas Gonzalez Other Clinician: Referring [place] Kuang: Treating Tiaunna Buford/Extender: Thomas Gonzalez in Treatment: 18 Vital Signs Height(in): 69 Pulse(bpm): 93 Weight(lbs): 191 Blood Pressure(mmHg): 135/87 Body Mass Index(BMI): 28 Temperature(F): 98 Respiratory Rate(breaths/min): 16 Photos: [1:No Photos Abdomen - midline] [N/A:N/A N/A] Wound Location: [1:Surgical Injury] [N/A:N/A] Wounding Event: [1:Open Surgical Wound] [N/A:N/A] Primary Etiology: [1:Received Chemotherapy, Received N/A] Comorbid History:  [1:Radiation 09/30/2020] [N/A:N/A] Date Acquired: [1:18] [N/A:N/A] Weeks of Treatment: [1:Open] [N/A:N/A] Wound Status: [1:3x3x0.1] [N/A:N/A] Measurements L x W x D (cm) [1:7.069] [N/A:N/A] A (cm) : rea [1:0.707] [N/A:N/A] Volume (cm) : [1:86.40%] [N/A:N/A] % Reduction in Area: [1:86.40%] [N/A:N/A] % Reduction in Volume: [1:Full Thickness Without Exposed] [N/A:N/A] Classification: [1:Support Structures Medium] [N/A:N/A] Exudate Amount: [1:Serosanguineous] [N/A:N/A] Exudate Type: [1:red, brown] [N/A:N/A] Exudate Color: [1:Distinct, outline attached] [N/A:N/A] Wound Margin: [1:Large (67-100%)] [N/A:N/A] Granulation Amount: [1:Red] [N/A:N/A] Granulation Quality: [1:None Present (0%)] [N/A:N/A] Necrotic Amount: [1:Fat Layer (Subcutaneous Tissue): Yes N/A] Exposed  Structures: [1:Fascia: No Tendon: No Muscle: No Joint: No Bone: No Small (1-33%)] [N/A:N/A] Treatment Notes Electronic Signature(s) Signed: 10/27/2021 2:34:29 PM By: [name] DO Signed: 10/27/2021 4:21:00 PM By: Rhae Hammock RN Entered By: [name] on 10/27/2021 14:31:26 -------------------------------------------------------------------------------- Multi-Disciplinary Care Plan Details Patient Name: Date of Service: [name] 10/27/2021 2:30 PM Medical Record Number: [id number] Patient Account Number: [id number] Date of Birth/Sex: Treating RN: Aug 11, 1966 (55 y.o. Thomas Gonzalez Primary Care Garnett Rekowski: Thomas Gonzalez Other Clinician: Referring Hydia Copelin: Treating Jahziah Simonin/Extender: Thomas Gonzalez in Treatment: 18 Multidisciplinary Care Plan reviewed with physician Active Inactive Wound/Skin Impairment Nursing Diagnoses: Impaired tissue integrity Knowledge deficit related to ulceration/compromised skin integrity Goals: Patient/caregiver will verbalize understanding of skin care regimen Date Initiated: 06/22/2021 Target Resolution Date: 11/10/2021 Goal Status:  Active Ulcer/skin breakdown will have a volume reduction of 30% by week 4 Date Initiated: 06/22/2021 Date Inactivated: 08/03/2021 Target Resolution Date: 07/20/2021 Goal Status: Met Ulcer/skin breakdown will have a volume reduction of 50% by week 8 Date Initiated: 08/03/2021 Date Inactivated: 08/24/2021 Target Resolution Date: 08/17/2021 Unmet Reason: 30% both volume Goal Status: Unmet reduction. wound has made improvement see wound chart. Interventions: Assess patient/caregiver ability to obtain necessary supplies Assess patient/caregiver ability to perform ulcer/skin care regimen upon admission and as needed Assess ulceration(s) every visit Provide education on ulcer and skin care Treatment Activities: Skin care regimen initiated : 06/22/2021 Topical wound management initiated : 06/22/2021 Notes: Electronic Signature(s) Signed: 10/27/2021 5:07:57 PM By: Thomas Gonzalez Entered By: Thomas Gonzalez on 10/27/2021 14:26:23 -------------------------------------------------------------------------------- Pain Assessment Details Patient Name: Date of Service: Thomas Real K. 10/27/2021 2:30 PM Medical Record Number: [id number] Patient Account Number: [id number] Date of Birth/Sex: Treating RN: 1966/09/25 (55 y.o. Thomas Gonzalez Primary Care Deondre Marinaro: Thomas Gonzalez Other Clinician: Referring Elianys Conry: Treating Alfretta Pinch/Extender: Thomas Gonzalez in Treatment: 18 Active Problems Location of Pain Severity and Description of Pain Patient Has Paino No Site Locations Pain Management and Medication Current Pain Management: Electronic Signature(s) Signed: 10/27/2021 5:07:57 PM By: Thomas Gonzalez Entered By: Thomas Gonzalez on 10/27/2021 14:24:59 -------------------------------------------------------------------------------- Patient/Caregiver Education Details Patient Name: Date of Service: [name] 12/8/2022andnbsp2:30 PM Medical Record Number:  [id number] Patient Account Number: [id number] Date of Birth/Gender: Treating RN: December 18, 1965 (55 y.o. Thomas Gonzalez Primary Care Physician: Thomas Gonzalez Other Clinician: Referring Physician: Treating Physician/Extender: Thomas Gonzalez in Treatment: 18 Education Assessment Education Provided To: Patient Education Topics Provided Wound/Skin Impairment: Methods: Demonstration, Explain/Verbal, Printed Responses: State content correctly Electronic Signature(s) Signed: 10/27/2021 5:07:57 PM By: Thomas Gonzalez Entered By: Thomas Gonzalez on 10/27/2021 14:26:46 -------------------------------------------------------------------------------- Wound Assessment Details Patient Name: Date of Service: [name]. 10/27/2021 2:30 PM Medical Record Number: [id number] Patient Account Number: [id number] Date of Birth/Sex: Treating RN: November 10, 1966 (55 y.o. Thomas Gonzalez Primary Care Meriem Lemieux: Thomas Gonzalez Other Clinician: Referring Mena Simonis: Treating Krystale Rinkenberger/Extender: Thomas Gonzalez in Treatment: 18 Wound Status Wound Number: 1 Primary Etiology: Open Surgical Wound Wound Location: Abdomen - midline Wound Status: Open Wounding Event: Surgical Injury Comorbid History: Received Chemotherapy, Received Radiation Date Acquired: 09/30/2020 Weeks Of Treatment: 18 Clustered Wound: No Photos Photo Uploaded By: Levan Hurst on 10/27/2021 16:56:52 Wound Measurements Length: (cm) 3 Width: (cm) 3 Depth: (cm) 0.1 Area: (cm) 7.069 Volume: (cm) 0.707 % Reduction in Area: 86.4% % Reduction in Volume: 86.4% Epithelialization: Small (1-33%) Tunneling: No Undermining: No Wound Description Classification: Full Thickness Without Exposed Support Structures Wound Margin: Distinct, outline attached Exudate  Amount: Medium Exudate Type: Serosanguineous Exudate Color: red, brown Foul Odor After Cleansing: No Slough/Fibrino No Wound  Bed Granulation Amount: Large (67-100%) Exposed Structure Granulation Quality: Red Fascia Exposed: No Necrotic Amount: None Present (0%) Fat Layer (Subcutaneous Tissue) Exposed: Yes Tendon Exposed: No Muscle Exposed: No Joint Exposed: No Bone Exposed: No Treatment Notes Wound #1 (Abdomen - midline) Cleanser Soap and Water Discharge Instruction: May shower and wash wound with dial antibacterial soap and water prior to dressing change. Wound Cleanser Discharge Instruction: Cleanse the wound with wound cleanser prior to applying a clean dressing using gauze sponges, not tissue or cotton balls. Peri-Wound Care Topical Primary Dressing Hydrofera Blue Ready Foam, 4x5 in Discharge Instruction: Apply to wound bed as instructed Secondary Dressing ABD Pad, 8x10 Discharge Instruction: Apply over primary dressing as directed. Secured With 60M Medipore H Soft Cloth Surgical T 4 x 2 (in/yd) ape Discharge Instruction: Secure dressing with tape as directed. Compression Wrap Compression Stockings Add-Ons Electronic Signature(s) Signed: 10/27/2021 5:07:57 PM By: Thomas Gonzalez Entered By: Thomas Gonzalez on 10/27/2021 14:25:35 -------------------------------------------------------------------------------- Vitals Details Patient Name: Date of Service: [name] 10/27/2021 2:30 PM Medical Record Number: [id number] Patient Account Number: [id number] Date of Birth/Sex: Treating RN: [birth year] (55 y.o. Thomas Gonzalez Primary Care Jarmon Javid: Thomas Gonzalez Other Clinician: Referring Erman Thum: Treating Sayvion Vigen/Extender: Thomas Gonzalez in Treatment: 18 Vital Signs Time Taken: 14:23 Temperature (F): 98 Height (in): 69 Pulse (bpm): 93 Weight (lbs): 191 Respiratory Rate (breaths/min): 16 Body Mass Index (BMI): 28.2 Blood Pressure (mmHg): 135/87 Reference Range: 80 - 120 mg / dl Electronic Signature(s) Signed: 10/27/2021 5:07:57 PM By: Thomas Gonzalez Entered By: Thomas Gonzalez on 10/27/2021 14:23:11

## 2021-10-27 NOTE — Progress Notes (Signed)
Thomas, Gonzalez ([id number]) Visit Report for 10/27/2021 Chief Complaint Document Details Patient Name: Date of Service: [name] 10/27/2021 2:30 PM Medical Record Number: [id number] Patient Account Number: [id number] Date of Birth/Sex: Treating RN: 02/23/1966 (55 y.o. Burnadette Gonzalez, Thomas Primary Care Provider: Reinaldo Gonzalez Other Clinician: Referring Provider: Treating Provider/Extender: Thomas Gonzalez in Treatment: 18 Information Obtained from: Patient Chief Complaint Surgical Ulcer Electronic Signature(s) Signed: 10/27/2021 2:34:29 PM By: Thomas Shan DO Entered By: Thomas Gonzalez on 10/27/2021 14:31:31 -------------------------------------------------------------------------------- HPI Details Patient Name: Date of Service: [name]. 10/27/2021 2:30 PM Medical Record Number: [id number] Patient Account Number: [id number] Date of Birth/Sex: Treating RN: [birth year] (55 y.o. Burnadette Gonzalez, Thomas Primary Care Provider: Reinaldo Gonzalez Other Clinician: Referring Provider: Treating Provider/Extender: Thomas Gonzalez in Treatment: 18 History of Present Illness HPI Description: 06/22/2021 patient presents today as a pleasant gentleman who unfortunately has been having quite a bit of an issue with his abdominal area following surgery in 2021 November. 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 Subsequently he also was on immunotherapy following to try to help with any residual carcinoma following the procedure. Nonetheless he had an issue with the incision site in the mid abdomen and unfortunately this ended up dehiscing and has had trouble since getting this healed. He has been seen by Dr. Tedra Gonzalez him where ACell was utilized but again there is really no response she referred the patient to [place] for further evaluation and treatment. No chemical cauterization with silver nitrate has  been utilized. He also has not had any he tells me aggressive sharp debridement of the area to clear away some of this hypergranular tissue which he has significant amounts noted. Either way I think that we will get a need to try to see about removing this I do think, to send sample of this to biopsy just to make sure there is no signs of cancer noted although I really think that is probably not can be the case I think we will get a get a report back stating hypergranulation tissue. With that being said the patient does seem to have quite a bit of bleeding and friability even with wiping over the wound area and again I think this is a big part of the issue again he can even grow new skin because anything attaching is attaching to the hypergranulation which is not even get a be anywhere close to sufficient for good epithelial growth. He also has had a CT scan in April which showed there was post esophagectomy with gastric pull-up anatomy but no evidence of local recurrence. There is no evidence of mediastinal nodal metastasis. He did have small bilateral pulmonary nodules which were unchanged recommend routine surveillance. Otherwise the patient has a ventral hernia following the surgery but no evidence of obstruction or gangrene. 06/28/2021 upon evaluation today patient's wound actually appears to be doing quite a bit better and it is hypergranular tissue is significantly improved compared to last time. With that being said they had a hard time getting the [facility] I am not sure exactly what all happened but the good news is that is something that should be on the way and he should have that if not today then tomorrow. Overall I think that with the [facility] this would have looked even better but nonetheless I am still pleased with what I am seeing today. 07/06/2021 upon evaluation today patient's wound is actually showing  signs of improvement and in general I feel like he is making excellent  progress. The Hydrofera Blue dressing to be doing excellent for him it does cause a little bit of bleeding when he removes it during dressing changes but other than that he is doing quite well based on what I am seeing. Fortunately there does not appear to be any signs of active infection at this time. No fevers, chills, nausea, vomiting, or diarrhea. 07/13/2021 upon evaluation today patient appears to be doing well with regard to his abdominal ulcer. Overall I am extremely pleased with where things stand I think he is making good progress. I do think the Hydrofera Blue is doing a great job as far as treating this is concerned. I do believe that as long as he is seeing improvement we should continue as such. We may be able to contemplate some other options such as skin substitutes but right now when see how we do with this. 08/03/2021 upon evaluation today patient appears to be doing well with regard to his abdominal ulcer. Week by week this is showing signs of improvement which is good news. Overall the surface of the wound is also greatly improved. I am extremely pleased with where we stand today. 08/24/2021 upon evaluation today patient's wound on the abdominal area appears to be doing excellent today. Fortunately there is no signs of active infection and very pleased in that regard. There does not appear to be any signs of active infection systemically either which is also good news. No fevers, chills, nausea, vomiting, or diarrhea. Upon inspection patient's wound bed showed signs of good granulation epithelization at this point. Fortunately there does not appear to be any signs of active infection at this time which is great news. 10/27; patient presents for 3-week follow-up. He has been using Hydrofera Blue to the wound bed without issues. He has no complaints today. He denies signs of infection. 11/17; patient presents for 3-week follow-up. He has been doing well with Hydrofera Blue dressing  changes. He has no issues or complaints today. He denies signs of infection. 12/8; patient presents for 3-week follow-up. He has been doing well with [facility] and has no issues or complaints today. Electronic Signature(s) Signed: 10/27/2021 2:34:29 PM By: Thomas Shan DO Entered By: Thomas Gonzalez on 10/27/2021 14:31:49 -------------------------------------------------------------------------------- Physical Exam Details Patient Name: Date of Service: Gentry Roch 10/27/2021 2:30 PM Medical Record Number: [id number] Patient Account Number: [id number] Date of Birth/Sex: Treating RN: 1965-12-07 (55 y.o. Burnadette Gonzalez, Thomas Primary Care Provider: Reinaldo Gonzalez Other Clinician: Referring Provider: Treating Provider/Extender: Thomas Gonzalez in Treatment: 18 Constitutional respirations regular, non-labored and within target range for patient.Marland Kitchen Psychiatric pleasant and cooperative. Notes Large open wound with granulation tissue throughout and epithelialization circumferentially to the edges. No signs of infection. Appears well-healing. Electronic Signature(s) Signed: 10/27/2021 2:34:29 PM By: Thomas Shan DO Entered By: Thomas Gonzalez on 10/27/2021 14:32:39 -------------------------------------------------------------------------------- Physician Orders Details Patient Name: Date of Service: [name]. 10/27/2021 2:30 PM Medical Record Number: [id number] Patient Account Number: [id number] Date of Birth/Sex: Treating RN: [birth year] (55 y.o. [name] Primary Care Provider: Reinaldo Gonzalez Other Clinician: Referring Provider: Treating Provider/Extender: Thomas Gonzalez in Treatment: [phone] Verbal / Phone Orders: No Diagnosis Coding ICD-10 Coding Code Description T81.31XA Disruption of external operation (surgical) wound, not elsewhere classified, initial encounter L98.492 Non-pressure chronic ulcer of  skin of other sites with fat layer exposed Z85.01 Personal history of malignant  neoplasm of esophagus K43.9 Ventral hernia without obstruction or gangrene Follow-up Appointments Return appointment in 3 weeks. - Dr. Heber Laurie Bathing/ Shower/ Hygiene May shower and wash wound with soap and water. - prior to dressing change Additional Orders / Instructions Follow Nutritious Diet - Increase protein Wound Treatment Wound #1 - Abdomen - midline Cleanser: Soap and Water Every Other Day/Other:90 days Discharge Instructions: May shower and wash wound with dial antibacterial soap and water prior to dressing change. Cleanser: Wound Cleanser Every Other Day/Other:90 days Discharge Instructions: Cleanse the wound with wound cleanser prior to applying a clean dressing using gauze sponges, not tissue or cotton balls. Prim Dressing: Hydrofera Blue Ready Foam, 4x5 in Every Other Day/Other:90 days ary Discharge Instructions: Apply to wound bed as instructed Secondary Dressing: ABD Pad, 8x10 Every Other Day/Other:90 days Discharge Instructions: Apply over primary dressing as directed. Secured With: 54M Medipore H Soft Cloth Surgical T 4 x 2 (in/yd) Every Other Day/Other:90 days ape Discharge Instructions: Secure dressing with tape as directed. Electronic Signature(s) Signed: 10/27/2021 2:34:29 PM By: Thomas Shan DO Entered By: Thomas Gonzalez on 10/27/2021 14:32:54 -------------------------------------------------------------------------------- Problem List Details Patient Name: Date of Service: [name]. 10/27/2021 2:30 PM Medical Record Number: [id number] Patient Account Number: [id number] Date of Birth/Sex: Treating RN: 20-Jan-1966 (55 y.o. [name] Primary Care Provider: Reinaldo Gonzalez Other Clinician: Referring Provider: Treating Provider/Extender: Thomas Gonzalez in Treatment: 18 Active Problems ICD-10 Encounter Code Description Active Date  MDM Diagnosis T81.31XA Disruption of external operation (surgical) wound, not elsewhere classified, 06/22/2021 No Yes initial encounter L98.492 Non-pressure chronic ulcer of skin of other sites with fat layer exposed 06/22/2021 No Yes Z85.01 Personal history of malignant neoplasm of esophagus 06/22/2021 No Yes K43.9 Ventral hernia without obstruction or gangrene 06/22/2021 No Yes Inactive Problems Resolved Problems Electronic Signature(s) Signed: 10/27/2021 2:34:29 PM By: Thomas Shan DO Entered By: Thomas Gonzalez on 10/27/2021 14:31:21 -------------------------------------------------------------------------------- Progress Note Details Patient Name: Date of Service: [name]. 10/27/2021 2:30 PM Medical Record Number: [id number] Patient Account Number: [id number] Date of Birth/Sex: Treating RN: 1966-03-11 (55 y.o. Burnadette Gonzalez, Thomas Primary Care Provider: Reinaldo Gonzalez Other Clinician: Referring Provider: Treating Provider/Extender: Thomas Gonzalez in Treatment: 18 Subjective Chief Complaint Information obtained from Patient Surgical Ulcer History of Present Illness (HPI) 06/22/2021 patient presents today as a pleasant gentleman who unfortunately has been having quite a bit of an issue with his abdominal area following surgery in 2021 November. 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 Subsequently he also was on immunotherapy following to try to help with any residual carcinoma following the procedure. Nonetheless he had an issue with the incision site in the mid abdomen and unfortunately this ended up dehiscing and has had trouble since getting this healed. He has been seen by Dr. Tedra Gonzalez him where ACell was utilized but again there is really no response she referred the patient to [place] for further evaluation and treatment. No chemical cauterization with silver nitrate has been utilized. He also has not had any he tells  me aggressive sharp debridement of the area to clear away some of this hypergranular tissue which he has significant amounts noted. Either way I think that we will get a need to try to see about removing this I do think, to send sample of this to biopsy just to make sure there is no signs of cancer noted although I really think that is probably not can be the  case I think we will get a get a report back stating hypergranulation tissue. With that being said the patient does seem to have quite a bit of bleeding and friability even with wiping over the wound area and again I think this is a big part of the issue again he can even grow new skin because anything attaching is attaching to the hypergranulation which is not even get a be anywhere close to sufficient for good epithelial growth. He also has had a CT scan in April which showed there was post esophagectomy with gastric pull-up anatomy but no evidence of local recurrence. There is no evidence of mediastinal nodal metastasis. He did have small bilateral pulmonary nodules which were unchanged recommend routine surveillance. Otherwise the patient has a ventral hernia following the surgery but no evidence of obstruction or gangrene. 06/28/2021 upon evaluation today patient's wound actually appears to be doing quite a bit better and it is hypergranular tissue is significantly improved compared to last time. With that being said they had a hard time getting the [facility] I am not sure exactly what all happened but the good news is that is something that should be on the way and he should have that if not today then tomorrow. Overall I think that with the [facility] this would have looked even better but nonetheless I am still pleased with what I am seeing today. 07/06/2021 upon evaluation today patient's wound is actually showing signs of improvement and in general I feel like he is making excellent progress. The Hydrofera Blue dressing to be doing  excellent for him it does cause a little bit of bleeding when he removes it during dressing changes but other than that he is doing quite well based on what I am seeing. Fortunately there does not appear to be any signs of active infection at this time. No fevers, chills, nausea, vomiting, or diarrhea. 07/13/2021 upon evaluation today patient appears to be doing well with regard to his abdominal ulcer. Overall I am extremely pleased with where things stand I think he is making good progress. I do think the Hydrofera Blue is doing a great job as far as treating this is concerned. I do believe that as long as he is seeing improvement we should continue as such. We may be able to contemplate some other options such as skin substitutes but right now when see how we do with this. 08/03/2021 upon evaluation today patient appears to be doing well with regard to his abdominal ulcer. Week by week this is showing signs of improvement which is good news. Overall the surface of the wound is also greatly improved. I am extremely pleased with where we stand today. 08/24/2021 upon evaluation today patient's wound on the abdominal area appears to be doing excellent today. Fortunately there is no signs of active infection and very pleased in that regard. There does not appear to be any signs of active infection systemically either which is also good news. No fevers, chills, nausea, vomiting, or diarrhea. Upon inspection patient's wound bed showed signs of good granulation epithelization at this point. Fortunately there does not appear to be any signs of active infection at this time which is great news. 10/27; patient presents for 3-week follow-up. He has been using Hydrofera Blue to the wound bed without issues. He has no complaints today. He denies signs of infection. 11/17; patient presents for 3-week follow-up. He has been doing well with Hydrofera Blue dressing changes. He has no issues  or complaints today. He  denies signs of infection. 12/8; patient presents for 3-week follow-up. He has been doing well with [facility] and has no issues or complaints today. Patient History Information obtained from Patient. Family History Cancer - Siblings, No family history of Diabetes, Heart Disease, Hereditary Spherocytosis, Hypertension, Kidney Disease, Lung Disease, Seizures, Stroke, Thyroid Problems, Tuberculosis. Social History Former smoker, Marital Status - Married, Alcohol Use - Never, Drug Use - No History, Caffeine Use - Daily. Medical History Oncologic Patient has history of Received Chemotherapy - 09/20/20, Received Radiation Medical A Surgical History Notes nd Gastrointestinal esophageal carcinoma Objective Constitutional respirations regular, non-labored and within target range for patient.. Vitals Time Taken: 2:23 PM, Height: 69 in, Weight: 191 lbs, BMI: 28.2, Temperature: 98 F, Pulse: 93 bpm, Respiratory Rate: 16 breaths/min, Blood Pressure: 135/87 mmHg. Psychiatric pleasant and cooperative. General Notes: Large open wound with granulation tissue throughout and epithelialization circumferentially to the edges. No signs of infection. Appears well- healing. Integumentary (Hair, Skin) Wound #1 status is Open. Original cause of wound was Surgical Injury. The date acquired was: 09/30/2020. The wound has been in treatment 18 weeks. The wound is located on the Abdomen - midline. The wound measures 3cm length x 3cm width x 0.1cm depth; 7.069cm^2 area and 0.707cm^3 volume. There is Fat Layer (Subcutaneous Tissue) exposed. There is no tunneling or undermining noted. There is a medium amount of serosanguineous drainage noted. The wound margin is distinct with the outline attached to the wound base. There is large (67-100%) red granulation within the wound bed. There is no necrotic tissue within the wound bed. Assessment Active Problems ICD-10 Disruption of external operation (surgical)  wound, not elsewhere classified, initial encounter Non-pressure chronic ulcer of skin of other sites with fat layer exposed Personal history of malignant neoplasm of esophagus Ventral hernia without obstruction or gangrene Patient's wound has shown improvement in size and appearance since last clinic visit. I recommended continuing Hydrofera Blue with dressing changes every other day. No signs of infection on exam. Follow-up in 3 weeks. Plan Follow-up Appointments: Return appointment in 3 weeks. - Dr. Heber Bangor Bathing/ Shower/ Hygiene: May shower and wash wound with soap and water. - prior to dressing change Additional Orders / Instructions: Follow Nutritious Diet - Increase protein WOUND #1: - Abdomen - midline Wound Laterality: Cleanser: Soap and Water Every Other Day/Other:90 days Discharge Instructions: May shower and wash wound with dial antibacterial soap and water prior to dressing change. Cleanser: Wound Cleanser Every Other Day/Other:90 days Discharge Instructions: Cleanse the wound with wound cleanser prior to applying a clean dressing using gauze sponges, not tissue or cotton balls. Prim Dressing: Hydrofera Blue Ready Foam, 4x5 in Every Other Day/Other:90 days ary Discharge Instructions: Apply to wound bed as instructed Secondary Dressing: ABD Pad, 8x10 Every Other Day/Other:90 days Discharge Instructions: Apply over primary dressing as directed. Secured With: 29M Medipore H Soft Cloth Surgical T 4 x 2 (in/yd) Every Other Day/Other:90 days ape Discharge Instructions: Secure dressing with tape as directed. 1. Hydrofera Blue 2. Follow-up in 3 weeks Electronic Signature(s) Signed: 10/27/2021 2:34:29 PM By: Thomas Shan DO Entered By: Thomas Gonzalez on 10/27/2021 14:33:55 -------------------------------------------------------------------------------- HxROS Details Patient Name: Date of Service: [name] 10/27/2021 2:30 PM Medical Record Number:  [id number] Patient Account Number: [id number] Date of Birth/Sex: Treating RN: December 03, 1965 (55 y.o. Burnadette Gonzalez, Thomas Primary Care Provider: Reinaldo Gonzalez Other Clinician: Referring Provider: Treating Provider/Extender: Thomas Gonzalez in Treatment: 18 Information Obtained From Patient Gastrointestinal Medical  History: Past Medical History Notes: esophageal carcinoma Oncologic Medical History: Positive for: Received Chemotherapy - 09/20/20; Received Radiation Immunizations Pneumococcal Vaccine: Received Pneumococcal Vaccination: No Implantable Devices Yes Family and Social History Cancer: Yes - Siblings; Diabetes: No; Heart Disease: No; Hereditary Spherocytosis: No; Hypertension: No; Kidney Disease: No; Lung Disease: No; Seizures: No; Stroke: No; Thyroid Problems: No; Tuberculosis: No; Former smoker; Marital Status - Married; Alcohol Use: Never; Drug Use: No History; Caffeine Use: Daily; Financial Concerns: No; Food, Clothing or Shelter Needs: No; Support System Lacking: No; Transportation Concerns: No Electronic Signature(s) Signed: 10/27/2021 2:34:29 PM By: Thomas Shan DO Signed: 10/27/2021 4:21:00 PM By: Rhae Hammock RN Entered By: Thomas Gonzalez on 10/27/2021 14:32:05 -------------------------------------------------------------------------------- SuperBill Details Patient Name: Date of Service: [name]. 10/27/2021 Medical Record Number: [id number] Patient Account Number: [id number] Date of Birth/Sex: Treating RN: [birth year] (55 y.o. [name] Primary Care Provider: Reinaldo Gonzalez Other Clinician: Referring Provider: Treating Provider/Extender: Thomas Gonzalez in Treatment: 18 Diagnosis Coding ICD-10 Codes Code Description T81.31XA Disruption of external operation (surgical) wound, not elsewhere classified, initial encounter L98.492 Non-pressure chronic ulcer of skin of other sites with  fat layer exposed Z85.01 Personal history of malignant neoplasm of esophagus K43.9 Ventral hernia without obstruction or gangrene Facility Procedures CPT4 Code: [id number] Description: [phone] - WOUND CARE VISIT-LEV 2 EST PT Modifier: Quantity: 1 Physician Procedures : CPT4 Code Description Modifier [id number] - WC PHYS LEVEL 3 - EST PT ICD-10 Diagnosis Description T81.31XA Disruption of external operation (surgical) wound, not elsewhere classified, initial encounter L98.492 Non-pressure chronic ulcer of skin of  other sites with fat layer exposed Z85.01 Personal history of malignant neoplasm of esophagus K43.9 Ventral hernia without obstruction or gangrene Quantity: 1 Electronic Signature(s) Signed: 10/27/2021 2:34:29 PM By: Thomas Shan DO Entered By: Thomas Gonzalez on 10/27/2021 14:34:07

## 2021-10-28 ENCOUNTER — Telehealth: Payer: Self-pay | Admitting: Oncology

## 2021-10-28 NOTE — Progress Notes (Signed)
[facility]  [address] [place],  [place]  [address] [phone]  Clinic Day:  11/04/2021  Referring physician: Lillard Anes,*  This document serves as a record of services personally performed by [name], MD. It was created on their behalf by [facility], a trained medical scribe. The creation of this record is based on the scribe's personal observations and the provider's statements to them.  HISTORY OF PRESENT ILLNESS:  The patient is a 55 y.o. 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  He completed 12 cycles of maintenance nivolumab in November 2022.  He comes in today to go over his CT scan to assess his new disease baseline after the completion of all of his definitive therapy.  Since his last visit, the patient has been doing well.  He denies having any dysphagia, abdominal discomfort or other GI symptoms which concern him for disease recurrence.     With respect to his GE junction cancer treatment history, he completed neoadjuvant chemoradiation, which consisted of 3 cycles of FOLFOX chemotherapy.  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  Pathology from this surgery showed persistent adenocarcinoma which extended into the muscularis propria level of his stomach.  Based upon this, he underwent adjuvant nivolumab immunotherapy for 1 year.   PHYSICAL EXAM:  Blood pressure 137/87, pulse 97, temperature 98.1 F (36.7 C), resp. rate 16, height 5\' 9"  (1.753 m), weight 192 lb 1.6 oz (87.1 kg), SpO2 96 %. Wt Readings from Last 3 Encounters:  11/04/21 192 lb 1.6 oz (87.1 kg)  09/23/21 191 lb (86.6 kg)  09/20/21 190 lb 14.4 oz (86.6 kg)   Body mass index is 28.37 kg/m. Performance status (ECOG): 1 - Symptomatic but completely ambulatory Physical Exam Constitutional:      General: He is not in acute distress.    Appearance: Normal appearance. He is normal  weight.  HENT:     Head: Normocephalic and atraumatic.  Eyes:     General: No scleral icterus.    Extraocular Movements: Extraocular movements intact.     Conjunctiva/sclera: Conjunctivae normal.     Pupils: Pupils are equal, round, and reactive to light.  Cardiovascular:     Rate and Rhythm: Normal rate and regular rhythm.     Pulses: Normal pulses.     Heart sounds: Normal heart sounds. No murmur heard.   No friction rub. No gallop.  Pulmonary:     Effort: Pulmonary effort is normal. No respiratory distress.     Breath sounds: Normal breath sounds.  Abdominal:     General: Bowel sounds are normal. There is no distension.     Palpations: Abdomen is soft. There is no hepatomegaly, splenomegaly or mass.     Tenderness: There is no abdominal tenderness.  Musculoskeletal:        General: Normal range of motion.     Cervical back: Normal range of motion and neck supple.     Right lower leg: No edema.     Left lower leg: No edema.  Lymphadenopathy:     Cervical: No cervical adenopathy.  Skin:    General: Skin is warm and dry.  Neurological:     General: No focal deficit present.     Mental Status: He is alert and oriented to person, place, and time. Mental status is at baseline.  Psychiatric:        Mood and Affect: Mood normal.  Behavior: Behavior normal.        Thought Content: Thought content normal.        Judgment: Judgment normal.   SCANS:  CT scans of his abdomen/pelvis revealed the following:   FINDINGS:  CT CHEST FINDINGS  Cardiovascular: Accessed right chest wall Port-A-Cath with tip at  the superior cavoatrial junction. No thoracic aortic aneurysm or  dissection. No central pulmonary embolus on this nondedicated study.  Three-vessel coronary artery calcifications. Normal size heart. No  significant pericardial effusion/thickening.  Mediastinum/Nodes: No supraclavicular adenopathy. No discrete  thyroid nodularity. No pathologically enlarged mediastinal,  hilar or  axillary lymph nodes. Stable gastric pull-through anatomy with  anastomosis at the thoracic inlet.  Lungs/Pleura: Scattered small pulmonary nodules are unchanged for  instance in the right upper lobe on image 21/4, in the right lower  lobe on image 48/4, and in the left upper lobe on image 42/4 stable  over multiple priors consistent with benign etiology. No new  suspicious pulmonary nodules or masses. Left lower lobe calcified  granuloma. No pleural effusion. No pneumothorax.  Musculoskeletal: Thoracic spondylosis. Degenerative changes  bilateral shoulders. No aggressive lytic or blastic lesion of bone.  CT ABDOMEN FINDINGS Hepatobiliary: No suspicious hepatic lesion. Gallbladder is  unremarkable. No biliary ductal dilation.  Pancreas: No pancreatic ductal dilation or evidence of acute  inflammation.  Spleen: Within normal limits.  Adrenals/Urinary Tract: Bilateral adrenal glands are unremarkable.  No hydronephrosis. No solid enhancing renal mass.  Stomach/Bowel: Radiopaque enteric contrast material traverses the  hepatic flexure. Gastric pull-through anatomy appears stable from  prior. No pathologic dilation or evidence of acute inflammation  involving loops of large or small bowel in the abdomen. Moderate  colonic stool burden likely reflecting constipation.  Vascular/Lymphatic: No abdominal aortic aneurysm. No pathologically  enlarged abdominal lymph nodes.  Other: No significant abdominal free fluid. No discrete peritoneal  or omental nodularity. Laxity of the ventral abdominal wall with  prior ventral hernia repair.  Musculoskeletal: Mild spondylosis. No aggressive lytic or blastic  lesion of bone.  IMPRESSION:  1. Status post esophagectomy with gastric pull-through anatomy. No  evidence of recurrent or metastatic disease in the chest or abdomen.   ASSESSMENT & PLAN:  Assessment/Plan:  A 55 y.o. male with stage IVA (T3 N2 M0) adenocarcinoma of his gastroesophageal  junction. In clinic today, I went over his CT scans with him, for which he could see he is disease free.  Clinically, he is doing very well.  Moving forward, disease surveillance will take place.  I will see him back in 6 months for repeat clinical assessment.  CT scans will be done a day before his next visit to ensure he remains disease free.  The patient understands all the plans discussed today and is in agreement with them.     I, Rita Ohara, am acting as scribe for [name], MD    I have reviewed this report as typed by the medical scribe, and it is complete and accurate.  Ariela Mochizuki Macarthur Critchley, MD

## 2021-10-28 NOTE — Telephone Encounter (Signed)
Patient notified of scheduled 12/14 Labs, CT Scans - Pt will pick up Contrast/Instructions at [facility]

## 2021-11-04 ENCOUNTER — Inpatient Hospital Stay: Payer: 59 | Attending: Oncology | Admitting: Oncology

## 2021-11-04 ENCOUNTER — Other Ambulatory Visit: Payer: Self-pay | Admitting: Oncology

## 2021-11-04 VITALS — BP 137/87 | HR 97 | Temp 98.1°F | Resp 16 | Ht 69.0 in | Wt 192.1 lb

## 2021-11-04 DIAGNOSIS — C159 Malignant neoplasm of esophagus, unspecified: Secondary | ICD-10-CM

## 2021-11-06 ENCOUNTER — Encounter: Payer: Self-pay | Admitting: Oncology

## 2021-11-08 ENCOUNTER — Encounter: Payer: Self-pay | Admitting: Oncology

## 2021-11-17 ENCOUNTER — Encounter (HOSPITAL_BASED_OUTPATIENT_CLINIC_OR_DEPARTMENT_OTHER): Payer: 59 | Admitting: Internal Medicine

## 2021-11-17 ENCOUNTER — Other Ambulatory Visit: Payer: Self-pay

## 2021-11-17 ENCOUNTER — Encounter: Payer: Self-pay | Admitting: Oncology

## 2021-11-17 DIAGNOSIS — L98492 Non-pressure chronic ulcer of skin of other sites with fat layer exposed: Secondary | ICD-10-CM

## 2021-11-17 DIAGNOSIS — Z8501 Personal history of malignant neoplasm of esophagus: Secondary | ICD-10-CM | POA: Diagnosis not present

## 2021-11-17 DIAGNOSIS — T8131XA Disruption of external operation (surgical) wound, not elsewhere classified, initial encounter: Secondary | ICD-10-CM | POA: Diagnosis not present

## 2021-11-17 DIAGNOSIS — K439 Ventral hernia without obstruction or gangrene: Secondary | ICD-10-CM

## 2021-11-18 NOTE — Progress Notes (Signed)
VERBON, GIANGREGORIO ([id number]) Visit Report for 11/17/2021 Arrival Information Details Patient Name: Date of Service: [name] 11/17/2021 2:30 PM Medical Record Number: [id number] Patient Account Number: [id number] Date of Birth/Sex: Treating RN: [birth year] (55 y.o. Thomas Gonzalez Primary Care Thomas Gonzalez: Thomas Gonzalez Other Clinician: Referring Thomas Gonzalez: Treating Mir Fullilove/Extender: Thomas Gonzalez in Treatment: 21 Visit Information History Since Last Visit Added or deleted any medications: No Patient Arrived: Ambulatory Any new allergies or adverse reactions: No Arrival Time: 15:21 Had a fall or experienced change in No Transfer Assistance: None activities of daily living that may affect Patient Identification Verified: Yes risk of falls: Secondary Verification Process Completed: Yes Signs or symptoms of abuse/neglect since last visito No Patient Requires Transmission-Based Precautions: No Hospitalized since last visit: No Patient Has Alerts: No Implantable device outside of the clinic excluding No cellular tissue based products placed in the center since last visit: Has Dressing in Place as Prescribed: Yes Pain Present Now: No Electronic Signature(s) Signed: 11/17/2021 4:49:14 PM By: Lorrin Jackson Entered By: Lorrin Jackson on 11/17/2021 15:24:12 -------------------------------------------------------------------------------- Clinic Level of Care Assessment Details Patient Name: Date of Service: [name] 11/17/2021 2:30 PM Medical Record Number: [id number] Patient Account Number: [id number] Date of Birth/Sex: Treating RN: [birth year] (55 y.o. Thomas Gonzalez Primary Care Emalea Mix: Thomas Gonzalez Other Clinician: Referring Auguste Gonzalez: Treating Ramesha Poster/Extender: Thomas Gonzalez in Treatment: 21 Clinic Level of Care Assessment Items TOOL 4 Quantity Score X- 1 0 Use when only an EandM is performed  on FOLLOW-UP visit ASSESSMENTS - Nursing Assessment / Reassessment X- 1 10 Reassessment of Co-morbidities (includes updates in patient status) X- 1 5 Reassessment of Adherence to Treatment Plan ASSESSMENTS - Wound and Skin A ssessment / Reassessment X - Simple Wound Assessment / Reassessment - one wound 1 5 []  - 0 Complex Wound Assessment / Reassessment - multiple wounds []  - 0 Dermatologic / Skin Assessment (not related to wound area) ASSESSMENTS - Focused Assessment []  - 0 Circumferential Edema Measurements - multi extremities []  - 0 Nutritional Assessment / Counseling / Intervention []  - 0 Lower Extremity Assessment (monofilament, tuning fork, pulses) []  - 0 Peripheral Arterial Disease Assessment (using hand held doppler) ASSESSMENTS - Ostomy and/or Continence Assessment and Care []  - 0 Incontinence Assessment and Management []  - 0 Ostomy Care Assessment and Management (repouching, etc.) PROCESS - Coordination of Care []  - 0 Simple Patient / Family Education for ongoing care X- 1 20 Complex (extensive) Patient / Family Education for ongoing care []  - 0 Staff obtains Programmer, systems, Records, T Results / Process Orders est []  - 0 Staff telephones HHA, Nursing Homes / Clarify orders / etc []  - 0 Routine Transfer to another Facility (non-emergent condition) []  - 0 Routine Hospital Admission (non-emergent condition) []  - 0 New Admissions / Biomedical engineer / Ordering NPWT Apligraf, etc. , []  - 0 Emergency Hospital Admission (emergent condition) []  - 0 Simple Discharge Coordination []  - 0 Complex (extensive) Discharge Coordination PROCESS - Special Needs []  - 0 Pediatric / Minor Patient Management []  - 0 Isolation Patient Management []  - 0 Hearing / Language / Visual special needs []  - 0 Assessment of Community assistance (transportation, D/C planning, etc.) []  - 0 Additional assistance / Altered mentation []  - 0 Support Surface(s) Assessment (bed, cushion,  seat, etc.) INTERVENTIONS - Wound Cleansing / Measurement X - Simple Wound Cleansing - one wound 1 5 []  - 0 Complex Wound Cleansing - multiple wounds X- 1 5 Wound  Imaging (photographs - any number of wounds) []  - 0 Wound Tracing (instead of photographs) X- 1 5 Simple Wound Measurement - one wound []  - 0 Complex Wound Measurement - multiple wounds INTERVENTIONS - Wound Dressings []  - 0 Small Wound Dressing one or multiple wounds X- 1 15 Medium Wound Dressing one or multiple wounds []  - 0 Large Wound Dressing one or multiple wounds []  - 0 Application of Medications - topical []  - 0 Application of Medications - injection INTERVENTIONS - Miscellaneous []  - 0 External ear exam []  - 0 Specimen Collection (cultures, biopsies, blood, body fluids, etc.) []  - 0 Specimen(s) / Culture(s) sent or taken to Lab for analysis []  - 0 Patient Transfer (multiple staff / Civil Service fast streamer / Similar devices) []  - 0 Simple Staple / Suture removal (25 or less) []  - 0 Complex Staple / Suture removal (26 or more) []  - 0 Hypo / Hyperglycemic Management (close monitor of Blood Glucose) []  - 0 Ankle / Brachial Index (ABI) - do not check if billed separately X- 1 5 Vital Signs Has the patient been seen at the hospital within the last three years: Yes Total Score: 75 Level Of Care: New/Established - Level 2 Electronic Signature(s) Signed: 11/17/2021 4:49:14 PM By: Lorrin Jackson Entered By: Lorrin Jackson on 11/17/2021 15:54:20 -------------------------------------------------------------------------------- Encounter Discharge Information Details Patient Name: Date of Service: [place] Thomas Thomas K. 11/17/2021 2:30 PM Medical Record Number: [id number] Patient Account Number: [id number] Date of Birth/Sex: Treating RN: Jan 14, 1966 (55 y.o. Thomas Gonzalez Primary Care Thomas Gonzalez: Thomas Gonzalez Other Clinician: Referring Biana Haggar: Treating Thomas Gonzalez: Thomas Gonzalez in  Treatment: 21 Encounter Discharge Information Items Discharge Condition: Stable Ambulatory Status: Ambulatory Discharge Destination: Home Transportation: Private Auto Schedule Follow-up Appointment: Yes Clinical Summary of Care: Provided on 11/17/2021 Form Type Recipient Paper Patient Patient Electronic Signature(s) Signed: 11/17/2021 4:49:14 PM By: Lorrin Jackson Entered By: Lorrin Jackson on 11/17/2021 15:55:22 -------------------------------------------------------------------------------- Lower Extremity Assessment Details Patient Name: Date of Service: [name] 11/17/2021 2:30 PM Medical Record Number: [id number] Patient Account Number: [id number] Date of Birth/Sex: Treating RN: 1966/04/27 (55 y.o. Thomas Gonzalez Primary Care Ludie Hudon: Thomas Gonzalez Other Clinician: Referring Jammy Plotkin: Treating Hanna Aultman/Extender: Thomas Gonzalez in Treatment: 21 Electronic Signature(s) Signed: 11/17/2021 4:49:14 PM By: Lorrin Jackson Entered By: Lorrin Jackson on 11/17/2021 15:24:43 -------------------------------------------------------------------------------- Multi Wound Chart Details Patient Name: Date of Service: MA Thomas Thomas K. 11/17/2021 2:30 PM Medical Record Number: [id number] Patient Account Number: [id number] Date of Birth/Sex: Treating RN: 1966/06/17 (55 y.o. [name] Primary Care Khoi Hamberger: Thomas Gonzalez Other Clinician: Referring Alfonzo Arca: Treating Hernandez Losasso/Extender: Thomas Gonzalez in Treatment: 21 Vital Signs Height(in): 69 Pulse(bpm): 90 Weight(lbs): 191 Blood Pressure(mmHg): 120/77 Body Mass Index(BMI): 28 Temperature(F): 98.4 Respiratory Rate(breaths/min): 16 Photos: [N/A:N/A] Abdomen - midline N/A N/A Wound Location: Surgical Injury N/A N/A Wounding Event: Open Surgical Wound N/A N/A Primary Etiology: Received Chemotherapy, Received N/A N/A Comorbid  History: Radiation 09/30/2020 N/A N/A Date Acquired: 21 N/A N/A Weeks of Treatment: Open N/A N/A Wound Status: 3x2.5x0.1 N/A N/A Measurements L x W x D (cm) 5.89 N/A N/A A (cm) : rea 0.589 N/A N/A Volume (cm) : 88.70% N/A N/A % Reduction in Area: 88.70% N/A N/A % Reduction in Volume: Full Thickness Without Exposed N/A N/A Classification: Support Structures Medium N/A N/A Exudate Amount: Serosanguineous N/A N/A Exudate Type: red, brown N/A N/A Exudate Color: Distinct, outline attached N/A N/A Wound Margin: Large (67-100%) N/A N/A Granulation Amount: Red  N/A N/A Granulation Quality: None Present (0%) N/A N/A Necrotic Amount: Fat Layer (Subcutaneous Tissue): Yes N/A N/A Exposed Structures: Fascia: No Tendon: No Muscle: No Joint: No Bone: No Medium (34-66%) N/A N/A Epithelialization: Treatment Notes Electronic Signature(s) Signed: 11/17/2021 3:54:15 PM By: [name] DO Signed: 11/18/2021 2:54:09 PM By: Levan Hurst RN, BSN Entered By: [name] on 11/17/2021 15:50:40 -------------------------------------------------------------------------------- Multi-Disciplinary Care Plan Details Patient Name: Date of Service: MA Thomas Thomas K. 11/17/2021 2:30 PM Medical Record Number: [id number] Patient Account Number: [id number] Date of Birth/Sex: Treating RN: May 23, 1966 (55 y.o. Thomas Gonzalez Primary Care Kynedi Profitt: Thomas Gonzalez Other Clinician: Referring Idus Rathke: Treating Raizel Wesolowski/Extender: Thomas Gonzalez in Treatment: 21 Multidisciplinary Care Plan reviewed with physician Active Inactive Wound/Skin Impairment Nursing Diagnoses: Impaired tissue integrity Knowledge deficit related to ulceration/compromised skin integrity Goals: Patient/caregiver will verbalize understanding of skin care regimen Date Initiated: 06/22/2021 Target Resolution Date: 12/15/2021 Goal Status: Active Ulcer/skin breakdown will have a  volume reduction of 30% by week 4 Date Initiated: 06/22/2021 Date Inactivated: 08/03/2021 Target Resolution Date: 07/20/2021 Goal Status: Met Ulcer/skin breakdown will have a volume reduction of 50% by week 8 Date Initiated: 08/03/2021 Date Inactivated: 08/24/2021 Target Resolution Date: 08/17/2021 Unmet Reason: 30% both volume Goal Status: Unmet reduction. wound has made improvement see wound chart. Interventions: Assess patient/caregiver ability to obtain necessary supplies Assess patient/caregiver ability to perform ulcer/skin care regimen upon admission and as needed Assess ulceration(s) every visit Provide education on ulcer and skin care Treatment Activities: Skin care regimen initiated : 06/22/2021 Topical wound management initiated : 06/22/2021 Notes: 11/17/21: Wound care regimen continues, patient compliant with dressings. Electronic Signature(s) Signed: 11/17/2021 3:31:23 PM By: Lorrin Jackson Entered By: Lorrin Jackson on 11/17/2021 15:31:22 -------------------------------------------------------------------------------- Pain Assessment Details Patient Name: Date of Service: [name] 11/17/2021 2:30 PM Medical Record Number: [id number] Patient Account Number: [id number] Date of Birth/Sex: Treating RN: 1965/11/22 (55 y.o. Thomas Gonzalez Primary Care Chastelyn Athens: Thomas Gonzalez Other Clinician: Referring Haylei Cobin: Treating Naysa Puskas/Extender: Thomas Gonzalez in Treatment: 21 Active Problems Location of Pain Severity and Description of Pain Patient Has Paino No Site Locations Pain Management and Medication Current Pain Management: Electronic Signature(s) Signed: 11/17/2021 4:49:14 PM By: Lorrin Jackson Entered By: Lorrin Jackson on 11/17/2021 15:24:37 -------------------------------------------------------------------------------- Patient/Caregiver Education Details Patient Name: Date of Service: [name]  12/29/2022andnbsp2:30 PM Medical Record Number: [id number] Patient Account Number: [id number] Date of Birth/Gender: Treating RN: [birth year] (55 y.o. Thomas Gonzalez Primary Care Physician: Thomas Gonzalez Other Clinician: Referring Physician: Treating Physician/Extender: Thomas Gonzalez in Treatment: 21 Education Assessment Education Provided To: Patient Education Topics Provided Wound/Skin Impairment: Methods: Explain/Verbal, Printed Responses: State content correctly [employer]) Signed: 11/17/2021 4:49:14 PM By: Lorrin Jackson Entered By: Lorrin Jackson on 11/17/2021 15:31:41 -------------------------------------------------------------------------------- Wound Assessment Details Patient Name: Date of Service: [name] 11/17/2021 2:30 PM Medical Record Number: [id number] Patient Account Number: [id number] Date of Birth/Sex: Treating RN: [birth year] (55 y.o. Thomas Gonzalez Primary Care Daishon Chui: Thomas Gonzalez Other Clinician: Referring Abram Sax: Treating Ulysse Siemen/Extender: Thomas Gonzalez in Treatment: 21 Wound Status Wound Number: 1 Primary Etiology: Open Surgical Wound Wound Location: Abdomen - midline Wound Status: Open Wounding Event: Surgical Injury Comorbid History: Received Chemotherapy, Received Radiation Date Acquired: 09/30/2020 Weeks Of Treatment: 21 Clustered Wound: No Photos Wound Measurements Length: (cm) 3 Width: (cm) 2.5 Depth: (cm) 0.1 Area: (cm) 5.89 Volume: (cm) 0.589 % Reduction in Area: 88.7% % Reduction in Volume: 88.7%  Epithelialization: Medium (34-66%) Tunneling: No Undermining: No Wound Description Classification: Full Thickness Without Exposed Support Structures Wound Margin: Distinct, outline attached Exudate Amount: Medium Exudate Type: Serosanguineous Exudate Color: red, brown Foul Odor After Cleansing: No Slough/Fibrino No Wound Bed Granulation  Amount: Large (67-100%) Exposed Structure Granulation Quality: Red Fascia Exposed: No Necrotic Amount: None Present (0%) Fat Layer (Subcutaneous Tissue) Exposed: Yes Tendon Exposed: No Muscle Exposed: No Joint Exposed: No Bone Exposed: No Treatment Notes Wound #1 (Abdomen - midline) Cleanser Soap and Water Discharge Instruction: May shower and wash wound with dial antibacterial soap and water prior to dressing change. Wound Cleanser Discharge Instruction: Cleanse the wound with wound cleanser prior to applying a clean dressing using gauze sponges, not tissue or cotton balls. Peri-Wound Care Topical Primary Dressing Hydrofera Blue Ready Foam, 4x5 in Discharge Instruction: Apply to wound bed as instructed Secondary Dressing ABD Pad, 8x10 Discharge Instruction: Apply over primary dressing as directed. Secured With 32M Medipore H Soft Cloth Surgical T 4 x 2 (in/yd) ape Discharge Instruction: Secure dressing with tape as directed. Compression Wrap Compression Stockings Add-Ons Electronic Signature(s) Signed: 11/17/2021 4:49:14 PM By: Lorrin Jackson Entered By: Lorrin Jackson on 11/17/2021 15:28:10 -------------------------------------------------------------------------------- Vitals Details Patient Name: Date of Service: MA Thomas Thomas K. 11/17/2021 2:30 PM Medical Record Number: [id number] Patient Account Number: [id number] Date of Birth/Sex: Treating RN: November 27, 1965 (55 y.o. Thomas Gonzalez Primary Care Sunny Aguon: Thomas Gonzalez Other Clinician: Referring Everson Mott: Treating Quina Wilbourne/Extender: Thomas Gonzalez in Treatment: 21 Vital Signs Time Taken: 15:24 Temperature (F): 98.4 Height (in): 69 Pulse (bpm): 90 Weight (lbs): 191 Respiratory Rate (breaths/min): 16 Body Mass Index (BMI): 28.2 Blood Pressure (mmHg): 120/77 Reference Range: 80 - 120 mg / dl Electronic Signature(s) Signed: 11/17/2021 4:49:14 PM By: Lorrin Jackson Entered By:  Lorrin Jackson on 11/17/2021 15:24:31

## 2021-11-18 NOTE — Progress Notes (Signed)
GABREAL, WORTON ([id number]) Visit Report for 11/17/2021 Chief Complaint Document Details Patient Name: Date of Service: MA [name] 11/17/2021 2:30 PM Medical Record Number: [id number] Patient Account Number: [id number] Date of Birth/Sex: Treating RN: [birth year] (55 y.o. [name] Primary Care Provider: [name] Other Clinician: Referring Provider: Treating Provider/Extender: [name] in Treatment: 21 Information Obtained from: Patient Chief Complaint Surgical Ulcer Electronic Signature(s) Signed: 11/17/2021 3:54:15 PM By: [name] DO Entered By: [name] on 11/17/2021 15:50:53 -------------------------------------------------------------------------------- HPI Details Patient Name: Date of Service: MA [name]. 11/17/2021 2:30 PM Medical Record Number: [id number] Patient Account Number: [id number] Date of Birth/Sex: Treating RN: 09-18-1966 (55 y.o. [name] Primary Care Provider: [name] Other Clinician: Referring Provider: Treating Provider/Extender: [name] in Treatment: 21 History of Present Illness HPI Description: 06/22/2021 patient presents today as a pleasant gentleman who unfortunately has been having quite a bit of an issue with his abdominal area following surgery in 2021 November. 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 Subsequently he also was on immunotherapy following to try to help with any residual carcinoma following the procedure. Nonetheless he had an issue with the incision site in the mid abdomen and unfortunately this ended up dehiscing and has had trouble since getting this healed. He has been seen by Dr. [name] him where ACell was utilized but again there is really no response she referred the patient to [place] for further evaluation and treatment. No chemical cauterization with silver nitrate has  been utilized. He also has not had any he tells me aggressive sharp debridement of the area to clear away some of this hypergranular tissue which he has significant amounts noted. Either way I think that we will get a need to try to see about removing this I do think, to send sample of this to biopsy just to make sure there is no signs of cancer noted although I really think that is probably not can be the case I think we will get a get a report back stating hypergranulation tissue. With that being said the patient does seem to have quite a bit of bleeding and friability even with wiping over the wound area and again I think this is a big part of the issue again he can even grow new skin because anything attaching is attaching to the hypergranulation which is not even get a be anywhere close to sufficient for good epithelial growth. He also has had a CT scan in April which showed there was post esophagectomy with gastric pull-up anatomy but no evidence of local recurrence. There is no evidence of mediastinal nodal metastasis. He did have small bilateral pulmonary nodules which were unchanged recommend routine surveillance. Otherwise the patient has a ventral hernia following the surgery but no evidence of obstruction or gangrene. 06/28/2021 upon evaluation today patient's wound actually appears to be doing quite a bit better and it is hypergranular tissue is significantly improved compared to last time. With that being said they had a hard time getting the [facility] I am not sure exactly what all happened but the good news is that is something that should be on the way and he should have that if not today then tomorrow. Overall I think that with the [facility] this would have looked even better but nonetheless I am still pleased with what I am seeing today. 07/06/2021 upon evaluation today patient's wound is actually showing  signs of improvement and in general I feel like he is making excellent  progress. The Hydrofera Blue dressing to be doing excellent for him it does cause a little bit of bleeding when he removes it during dressing changes but other than that he is doing quite well based on what I am seeing. Fortunately there does not appear to be any signs of active infection at this time. No fevers, chills, nausea, vomiting, or diarrhea. 07/13/2021 upon evaluation today patient appears to be doing well with regard to his abdominal ulcer. Overall I am extremely pleased with where things stand I think he is making good progress. I do think the Hydrofera Blue is doing a great job as far as treating this is concerned. I do believe that as long as he is seeing improvement we should continue as such. We may be able to contemplate some other options such as skin substitutes but right now when see how we do with this. 08/03/2021 upon evaluation today patient appears to be doing well with regard to his abdominal ulcer. Week by week this is showing signs of improvement which is good news. Overall the surface of the wound is also greatly improved. I am extremely pleased with where we stand today. 08/24/2021 upon evaluation today patient's wound on the abdominal area appears to be doing excellent today. Fortunately there is no signs of active infection and very pleased in that regard. There does not appear to be any signs of active infection systemically either which is also good news. No fevers, chills, nausea, vomiting, or diarrhea. Upon inspection patient's wound bed showed signs of good granulation epithelization at this point. Fortunately there does not appear to be any signs of active infection at this time which is great news. 10/27; patient presents for 3-week follow-up. He has been using Hydrofera Blue to the wound bed without issues. He has no complaints today. He denies signs of infection. 11/17; patient presents for 3-week follow-up. He has been doing well with Hydrofera Blue dressing  changes. He has no issues or complaints today. He denies signs of infection. 12/8; patient presents for 3-week follow-up. He has been doing well with [facility] and has no issues or complaints today. 12/29; patient presents for 3-week follow-up. He continues to use [facility] without any issues. He has no concerns today. Electronic Signature(s) Signed: 11/17/2021 3:54:15 PM By: [name] DO Entered By: [name] on 11/17/2021 15:51:19 -------------------------------------------------------------------------------- Physical Exam Details Patient Name: Date of Service: MA [name] 11/17/2021 2:30 PM Medical Record Number: [id number] Patient Account Number: [id number] Date of Birth/Sex: Treating RN: [birth year] (55 y.o. [name] Primary Care Provider: [name] Other Clinician: Referring Provider: Treating Provider/Extender: [name] in Treatment: 21 Constitutional respirations regular, non-labored and within target range for patient.Marland Kitchen Psychiatric pleasant and cooperative. Notes Abdomen: Large open wound with granulation tissue throughout and epithelialization circumferentially to the edges. No signs of infection. Appears well-healing. Electronic Signature(s) Signed: 11/17/2021 3:54:15 PM By: [name] DO Entered By: [name] on 11/17/2021 15:52:15 -------------------------------------------------------------------------------- Physician Orders Details Patient Name: Date of Service: MA [name]. 11/17/2021 2:30 PM Medical Record Number: [id number] Patient Account Number: [id number] Date of Birth/Sex: Treating RN: [birth year] (54 y.o. [name] Primary Care Provider: [name] Other Clinician: Referring Provider: Treating Provider/Extender: [name] in Treatment: 21 Verbal / Phone Orders: No Diagnosis Coding ICD-10 Coding Code  Description T81.31XA Disruption of external operation (surgical) wound, not elsewhere  classified, initial encounter L98.492 Non-pressure chronic ulcer of skin of other sites with fat layer exposed Z85.01 Personal history of malignant neoplasm of esophagus K43.9 Ventral hernia without obstruction or gangrene Follow-up Appointments Return appointment in 3 weeks. - Dr. Heber [place] Bathing/ Shower/ Hygiene May shower and wash wound with soap and water. - prior to dressing change Additional Orders / Instructions Follow Nutritious Diet - Increase protein Wound Treatment Wound #1 - Abdomen - midline Cleanser: Soap and Water Every Other Day/Other:90 days Discharge Instructions: May shower and wash wound with dial antibacterial soap and water prior to dressing change. Cleanser: Wound Cleanser Every Other Day/Other:90 days Discharge Instructions: Cleanse the wound with wound cleanser prior to applying a clean dressing using gauze sponges, not tissue or cotton balls. Prim Dressing: Hydrofera Blue Ready Foam, 4x5 in Every Other Day/Other:90 days ary Discharge Instructions: Apply to wound bed as instructed Secondary Dressing: ABD Pad, 8x10 Every Other Day/Other:90 days Discharge Instructions: Apply over primary dressing as directed. Secured With: 43M Medipore H Soft Cloth Surgical T 4 x 2 (in/yd) Every Other Day/Other:90 days ape Discharge Instructions: Secure dressing with tape as directed. Electronic Signature(s) Signed: 11/17/2021 3:54:15 PM By: [name] DO Entered By: [name] on 11/17/2021 15:52:38 -------------------------------------------------------------------------------- Problem List Details Patient Name: Date of Service: [name] 11/17/2021 2:30 PM Medical Record Number: [id number] Patient Account Number: [id number] Date of Birth/Sex: Treating RN: Dec 20, 1965 (55 y.o. [name] Primary Care Provider: [name] Other Clinician: Referring  Provider: Treating Provider/Extender: [name] in Treatment: 21 Active Problems ICD-10 Encounter Code Description Active Date MDM Diagnosis T81.31XA Disruption of external operation (surgical) wound, not elsewhere classified, 06/22/2021 No Yes initial encounter L98.492 Non-pressure chronic ulcer of skin of other sites with fat layer exposed 06/22/2021 No Yes Z85.01 Personal history of malignant neoplasm of esophagus 06/22/2021 No Yes K43.9 Ventral hernia without obstruction or gangrene 06/22/2021 No Yes Inactive Problems Resolved Problems Electronic Signature(s) Signed: 11/17/2021 3:54:15 PM By: [name] DO Previous Signature: 11/17/2021 3:30:36 PM Version By: Lorrin Jackson Entered By: [name] on 11/17/2021 15:50:31 -------------------------------------------------------------------------------- Progress Note Details Patient Name: Date of Service: [name] 11/17/2021 2:30 PM Medical Record Number: [id number] Patient Account Number: [id number] Date of Birth/Sex: Treating RN: 02-Jun-1966 (55 y.o. [name] Primary Care Provider: [name] Other Clinician: Referring Provider: Treating Provider/Extender: [name] in Treatment: 21 Subjective Chief Complaint Information obtained from Patient Surgical Ulcer History of Present Illness (HPI) 06/22/2021 patient presents today as a pleasant gentleman who unfortunately has been having quite a bit of an issue with his abdominal area following surgery in 2021 November. 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 Subsequently he also was on immunotherapy following to try to help with any residual carcinoma following the procedure. Nonetheless he had an issue with the incision site in the mid abdomen and unfortunately this ended up dehiscing and has had trouble since getting this healed. He has been seen by Dr. [name] him  where ACell was utilized but again there is really no response she referred the patient to [place] for further evaluation and treatment. No chemical cauterization with silver nitrate has been utilized. He also has not had any he tells me aggressive sharp debridement of the area to clear away some of this hypergranular tissue which he has significant amounts noted. Either way I think that we will get a need to try to see about removing this I do  think, to send sample of this to biopsy just to make sure there is no signs of cancer noted although I really think that is probably not can be the case I think we will get a get a report back stating hypergranulation tissue. With that being said the patient does seem to have quite a bit of bleeding and friability even with wiping over the wound area and again I think this is a big part of the issue again he can even grow new skin because anything attaching is attaching to the hypergranulation which is not even get a be anywhere close to sufficient for good epithelial growth. He also has had a CT scan in April which showed there was post esophagectomy with gastric pull-up anatomy but no evidence of local recurrence. There is no evidence of mediastinal nodal metastasis. He did have small bilateral pulmonary nodules which were unchanged recommend routine surveillance. Otherwise the patient has a ventral hernia following the surgery but no evidence of obstruction or gangrene. 06/28/2021 upon evaluation today patient's wound actually appears to be doing quite a bit better and it is hypergranular tissue is significantly improved compared to last time. With that being said they had a hard time getting the [facility] I am not sure exactly what all happened but the good news is that is something that should be on the way and he should have that if not today then tomorrow. Overall I think that with the [facility] this would have looked even better but nonetheless I am  still pleased with what I am seeing today. 07/06/2021 upon evaluation today patient's wound is actually showing signs of improvement and in general I feel like he is making excellent progress. The Hydrofera Blue dressing to be doing excellent for him it does cause a little bit of bleeding when he removes it during dressing changes but other than that he is doing quite well based on what I am seeing. Fortunately there does not appear to be any signs of active infection at this time. No fevers, chills, nausea, vomiting, or diarrhea. 07/13/2021 upon evaluation today patient appears to be doing well with regard to his abdominal ulcer. Overall I am extremely pleased with where things stand I think he is making good progress. I do think the Hydrofera Blue is doing a great job as far as treating this is concerned. I do believe that as long as he is seeing improvement we should continue as such. We may be able to contemplate some other options such as skin substitutes but right now when see how we do with this. 08/03/2021 upon evaluation today patient appears to be doing well with regard to his abdominal ulcer. Week by week this is showing signs of improvement which is good news. Overall the surface of the wound is also greatly improved. I am extremely pleased with where we stand today. 08/24/2021 upon evaluation today patient's wound on the abdominal area appears to be doing excellent today. Fortunately there is no signs of active infection and very pleased in that regard. There does not appear to be any signs of active infection systemically either which is also good news. No fevers, chills, nausea, vomiting, or diarrhea. Upon inspection patient's wound bed showed signs of good granulation epithelization at this point. Fortunately there does not appear to be any signs of active infection at this time which is great news. 10/27; patient presents for 3-week follow-up. He has been using Hydrofera Blue to the wound  bed without  issues. He has no complaints today. He denies signs of infection. 11/17; patient presents for 3-week follow-up. He has been doing well with Hydrofera Blue dressing changes. He has no issues or complaints today. He denies signs of infection. 12/8; patient presents for 3-week follow-up. He has been doing well with [facility] and has no issues or complaints today. 12/29; patient presents for 3-week follow-up. He continues to use [facility] without any issues. He has no concerns today. Patient History Information obtained from Patient. Family History Cancer - Siblings, No family history of Diabetes, Heart Disease, Hereditary Spherocytosis, Hypertension, Kidney Disease, Lung Disease, Seizures, Stroke, Thyroid Problems, Tuberculosis. Social History Former smoker, Marital Status - Married, Alcohol Use - Never, Drug Use - No History, Caffeine Use - Daily. Medical History Oncologic Patient has history of Received Chemotherapy - 09/20/20, Received Radiation Medical A Surgical History Notes nd Gastrointestinal esophageal carcinoma Objective Constitutional respirations regular, non-labored and within target range for patient.. Vitals Time Taken: 3:24 PM, Height: 69 in, Weight: 191 lbs, BMI: 28.2, Temperature: 98.4 F, Pulse: 90 bpm, Respiratory Rate: 16 breaths/min, Blood Pressure: 120/77 mmHg. Psychiatric pleasant and cooperative. General Notes: Abdomen: Large open wound with granulation tissue throughout and epithelialization circumferentially to the edges. No signs of infection. Appears well-healing. Integumentary (Hair, Skin) Wound #1 status is Open. Original cause of wound was Surgical Injury. The date acquired was: 09/30/2020. The wound has been in treatment 21 weeks. The wound is located on the Abdomen - midline. The wound measures 3cm length x 2.5cm width x 0.1cm depth; 5.89cm^2 area and 0.589cm^3 volume. There is Fat Layer (Subcutaneous Tissue) exposed. There is no  tunneling or undermining noted. There is a medium amount of serosanguineous drainage noted. The wound margin is distinct with the outline attached to the wound base. There is large (67-100%) red granulation within the wound bed. There is no necrotic tissue within the wound bed. Assessment Active Problems ICD-10 Disruption of external operation (surgical) wound, not elsewhere classified, initial encounter Non-pressure chronic ulcer of skin of other sites with fat layer exposed Personal history of malignant neoplasm of esophagus Ventral hernia without obstruction or gangrene Patient's wound has shown improvement in size and appearance since last clinic visit. No signs of infection on exam. I recommended continuing Hydrofera Blue with dressing changes. Follow-up in 3 weeks. Plan Follow-up Appointments: Return appointment in 3 weeks. - Dr. Heber [place] Bathing/ Shower/ Hygiene: May shower and wash wound with soap and water. - prior to dressing change Additional Orders / Instructions: Follow Nutritious Diet - Increase protein WOUND #1: - Abdomen - midline Wound Laterality: Cleanser: Soap and Water Every Other Day/Other:90 days Discharge Instructions: May shower and wash wound with dial antibacterial soap and water prior to dressing change. Cleanser: Wound Cleanser Every Other Day/Other:90 days Discharge Instructions: Cleanse the wound with wound cleanser prior to applying a clean dressing using gauze sponges, not tissue or cotton balls. Prim Dressing: Hydrofera Blue Ready Foam, 4x5 in Every Other Day/Other:90 days ary Discharge Instructions: Apply to wound bed as instructed Secondary Dressing: ABD Pad, 8x10 Every Other Day/Other:90 days Discharge Instructions: Apply over primary dressing as directed. Secured With: 76M Medipore H Soft Cloth Surgical T 4 x 2 (in/yd) Every Other Day/Other:90 days ape Discharge Instructions: Secure dressing with tape as directed. 1. Hydrofera Blue 2. Follow-up in 3  weeks Electronic Signature(s) Signed: 11/17/2021 3:54:15 PM By: [name] DO Entered By: [name] on 11/17/2021 15:53:33 -------------------------------------------------------------------------------- HxROS Details Patient Name: Date of Service: MA SSIE, GREGO RY K.  11/17/2021 2:30 PM Medical Record Number: [id number] Patient Account Number: [id number] Date of Birth/Sex: Treating RN: 1966/06/21 (55 y.o. [name] Primary Care Provider: [name] Other Clinician: Referring Provider: Treating Provider/Extender: [name] in Treatment: 21 Information Obtained From Patient Gastrointestinal Medical History: Past Medical History Notes: esophageal carcinoma Oncologic Medical History: Positive for: Received Chemotherapy - 09/20/20; Received Radiation Immunizations Pneumococcal Vaccine: Received Pneumococcal Vaccination: No Implantable Devices Yes Family and Social History Cancer: Yes - Siblings; Diabetes: No; Heart Disease: No; Hereditary Spherocytosis: No; Hypertension: No; Kidney Disease: No; Lung Disease: No; Seizures: No; Stroke: No; Thyroid Problems: No; Tuberculosis: No; Former smoker; Marital Status - Married; Alcohol Use: Never; Drug Use: No History; Caffeine Use: Daily; Financial Concerns: No; Food, Clothing or Shelter Needs: No; Support System Lacking: No; Transportation Concerns: No Electronic Signature(s) Signed: 11/17/2021 3:54:15 PM By: [name] DO Signed: 11/18/2021 2:54:09 PM By: Levan Hurst RN, BSN Entered By: [name] on 11/17/2021 15:51:27 -------------------------------------------------------------------------------- Graham Details Patient Name: Date of Service: MA [name]. 11/17/2021 Medical Record Number: [id number] Patient Account Number: [id number] Date of Birth/Sex: Treating RN: 1966-05-21 (55 y.o. [name] Primary Care Provider: [name] Other  Clinician: Referring Provider: Treating Provider/Extender: [name] in Treatment: 21 Diagnosis Coding ICD-10 Codes Code Description T81.31XA Disruption of external operation (surgical) wound, not elsewhere classified, initial encounter L98.492 Non-pressure chronic ulcer of skin of other sites with fat layer exposed Z85.01 Personal history of malignant neoplasm of esophagus K43.9 Ventral hernia without obstruction or gangrene Facility Procedures CPT4 Code: [id number] Description: [id number] - WOUND CARE VISIT-LEV 2 EST PT Modifier: Quantity: 1 Physician Procedures : CPT4 Code Description Modifier [id number] - WC PHYS LEVEL 3 - EST PT ICD-10 Diagnosis Description T81.31XA Disruption of external operation (surgical) wound, not elsewhere classified, initial encounter L98.492 Non-pressure chronic ulcer of skin of  other sites with fat layer exposed Z85.01 Personal history of malignant neoplasm of esophagus K43.9 Ventral hernia without obstruction or gangrene Quantity: 1 Electronic Signature(s) Signed: 11/17/2021 4:15:41 PM By: [name] DO Signed: 11/17/2021 4:49:14 PM By: Lorrin Jackson Previous Signature: 11/17/2021 3:54:15 PM Version By: [name] DO Entered By: Lorrin Jackson on 11/17/2021 15:54:31

## 2021-12-07 ENCOUNTER — Encounter: Payer: Self-pay | Admitting: Oncology

## 2021-12-08 ENCOUNTER — Other Ambulatory Visit: Payer: Self-pay

## 2021-12-08 ENCOUNTER — Encounter (HOSPITAL_BASED_OUTPATIENT_CLINIC_OR_DEPARTMENT_OTHER): Payer: Commercial Managed Care - HMO | Attending: Internal Medicine | Admitting: Internal Medicine

## 2021-12-08 DIAGNOSIS — K439 Ventral hernia without obstruction or gangrene: Secondary | ICD-10-CM | POA: Diagnosis not present

## 2021-12-08 DIAGNOSIS — T8131XA Disruption of external operation (surgical) wound, not elsewhere classified, initial encounter: Secondary | ICD-10-CM | POA: Diagnosis present

## 2021-12-08 DIAGNOSIS — C159 Malignant neoplasm of esophagus, unspecified: Secondary | ICD-10-CM | POA: Diagnosis not present

## 2021-12-08 DIAGNOSIS — Z8501 Personal history of malignant neoplasm of esophagus: Secondary | ICD-10-CM | POA: Insufficient documentation

## 2021-12-08 DIAGNOSIS — L98492 Non-pressure chronic ulcer of skin of other sites with fat layer exposed: Secondary | ICD-10-CM | POA: Insufficient documentation

## 2021-12-09 ENCOUNTER — Ambulatory Visit (INDEPENDENT_AMBULATORY_CARE_PROVIDER_SITE_OTHER): Payer: Self-pay | Admitting: Thoracic Surgery (Cardiothoracic Vascular Surgery)

## 2021-12-09 DIAGNOSIS — Z9049 Acquired absence of other specified parts of digestive tract: Secondary | ICD-10-CM

## 2021-12-09 DIAGNOSIS — Z9889 Other specified postprocedural states: Secondary | ICD-10-CM

## 2021-12-09 NOTE — Progress Notes (Signed)
°   °  [address]       [place] [address]             [phone]       Patient: Home Provider: Office Consent for Telemedicine visit obtained.  Todays visit was completed via a real-time telehealth (see specific modality noted below). The patient/authorized person provided oral consent at the time of the visit to engage in a telemedicine encounter with the present provider at [facility]. The patient/authorized person was informed of the potential benefits, limitations, and risks of telemedicine. The patient/authorized person expressed understanding that the laws that protect confidentiality also apply to telemedicine. The patient/authorized person acknowledged understanding that telemedicine does not provide emergency services and that he or she would need to call 911 or proceed to the nearest hospital for help if such a need arose.   Total time spent in the clinical discussion 10 minutes.  Telehealth Modality: Phone visit (audio only)  I had a telephone visit with Thomas Gonzalez.  He is status post transhiatal esophagectomy by Dr. Servando Snare in 2021.  Today we discussed the results of the cross-sectional imaging from December 2022.  There does not appear to be any recurrence.  He also follows with Dr. Bobby Rumpf out in [place] who has been ordering his scans.  The other area of concern was a ventral hernia for which she is meeting with a general surgeon in the next week.  From a thoracic surgical standpoint he appears to have good results from his esophagectomy.  He has no swallowing issues.  He will continue to follow-up with his oncologist in regards to his surveillance.

## 2021-12-12 ENCOUNTER — Encounter: Payer: Self-pay | Admitting: Oncology

## 2021-12-12 NOTE — Progress Notes (Signed)
JOWEL, WALTNER ([id number]) Visit Report for 12/08/2021 Chief Complaint Document Details Patient Name: Date of Service: [name] 12/08/2021 2:30 PM Medical Record Number: [id number] Patient Account Number: [id number] Date of Birth/Sex: Treating RN: 19-Apr-1966 (56 y.o. Thomas Gonzalez Primary Care Provider: Reinaldo Gonzalez Other Clinician: Referring Provider: Treating Provider/Extender: Thomas Gonzalez in Treatment: 24 Information Obtained from: Patient Chief Complaint Surgical Ulcer Electronic Signature(s) Signed: 12/08/2021 4:35:59 PM By: Thomas Shan DO Entered By: Thomas Gonzalez on 12/08/2021 16:31:12 -------------------------------------------------------------------------------- HPI Details Patient Name: Date of Service: [name]. 12/08/2021 2:30 PM Medical Record Number: [id number] Patient Account Number: [id number] Date of Birth/Sex: Treating RN: 1966-04-11 (56 y.o. Thomas Gonzalez Primary Care Provider: Reinaldo Gonzalez Other Clinician: Referring Provider: Treating Provider/Extender: Thomas Gonzalez in Treatment: 24 History of Present Illness HPI Description: 06/22/2021 patient presents today as a pleasant gentleman who unfortunately has been having quite a bit of an issue with his abdominal area following surgery in 2021 November. 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 Subsequently he also was on immunotherapy following to try to help with any residual carcinoma following the procedure. Nonetheless he had an issue with the incision site in the mid abdomen and unfortunately this ended up dehiscing and has had trouble since getting this healed. He has been seen by Dr. [name] him where ACell was utilized but again there is really no response she referred the patient to [place] for further evaluation and treatment. No chemical cauterization with silver nitrate has  been utilized. He also has not had any he tells me aggressive sharp debridement of the area to clear away some of this hypergranular tissue which he has significant amounts noted. Either way I think that we will get a need to try to see about removing this I do think, to send sample of this to biopsy just to make sure there is no signs of cancer noted although I really think that is probably not can be the case I think we will get a get a report back stating hypergranulation tissue. With that being said the patient does seem to have quite a bit of bleeding and friability even with wiping over the wound area and again I think this is a big part of the issue again he can even grow new skin because anything attaching is attaching to the hypergranulation which is not even get a be anywhere close to sufficient for good epithelial growth. He also has had a CT scan in April which showed there was post esophagectomy with gastric pull-up anatomy but no evidence of local recurrence. There is no evidence of mediastinal nodal metastasis. He did have small bilateral pulmonary nodules which were unchanged recommend routine surveillance. Otherwise the patient has a ventral hernia following the surgery but no evidence of obstruction or gangrene. 06/28/2021 upon evaluation today patient's wound actually appears to be doing quite a bit better and it is hypergranular tissue is significantly improved compared to last time. With that being said they had a hard time getting the [facility] I am not sure exactly what all happened but the good news is that is something that should be on the way and he should have that if not today then tomorrow. Overall I think that with the [facility] this would have looked even better but nonetheless I am still pleased with what I am seeing today. 07/06/2021 upon evaluation today patient's wound is actually showing  signs of improvement and in general I feel like he is making excellent  progress. The Hydrofera Blue dressing to be doing excellent for him it does cause a little bit of bleeding when he removes it during dressing changes but other than that he is doing quite well based on what I am seeing. Fortunately there does not appear to be any signs of active infection at this time. No fevers, chills, nausea, vomiting, or diarrhea. 07/13/2021 upon evaluation today patient appears to be doing well with regard to his abdominal ulcer. Overall I am extremely pleased with where things stand I think he is making good progress. I do think the Hydrofera Blue is doing a great job as far as treating this is concerned. I do believe that as long as he is seeing improvement we should continue as such. We may be able to contemplate some other options such as skin substitutes but right now when see how we do with this. 08/03/2021 upon evaluation today patient appears to be doing well with regard to his abdominal ulcer. Week by week this is showing signs of improvement which is good news. Overall the surface of the wound is also greatly improved. I am extremely pleased with where we stand today. 08/24/2021 upon evaluation today patient's wound on the abdominal area appears to be doing excellent today. Fortunately there is no signs of active infection and very pleased in that regard. There does not appear to be any signs of active infection systemically either which is also good news. No fevers, chills, nausea, vomiting, or diarrhea. Upon inspection patient's wound bed showed signs of good granulation epithelization at this point. Fortunately there does not appear to be any signs of active infection at this time which is great news. 10/27; patient presents for 3-week follow-up. He has been using Hydrofera Blue to the wound bed without issues. He has no complaints today. He denies signs of infection. 11/17; patient presents for 3-week follow-up. He has been doing well with Hydrofera Blue dressing  changes. He has no issues or complaints today. He denies signs of infection. 12/8; patient presents for 3-week follow-up. He has been doing well with [facility] and has no issues or complaints today. 12/29; patient presents for 3-week follow-up. He continues to use [facility] without any issues. He has no concerns today. 1/19; patient presents for follow-up. He continues to use [facility] without issues. Electronic Signature(s) Signed: 12/08/2021 4:35:59 PM By: Thomas Shan DO Entered By: Thomas Gonzalez on 12/08/2021 16:31:33 -------------------------------------------------------------------------------- Physical Exam Details Patient Name: Date of Service: [name] 12/08/2021 2:30 PM Medical Record Number: [id number] Patient Account Number: [id number] Date of Birth/Sex: Treating RN: [birth year] (56 y.o. Thomas Gonzalez Primary Care Provider: Reinaldo Gonzalez Other Clinician: Referring Provider: Treating Provider/Extender: Thomas Gonzalez in Treatment: 24 Constitutional respirations regular, non-labored and within target range for patient.Marland Kitchen Psychiatric pleasant and cooperative. Notes Abdomen: Large open wound with granulation tissue throughout and epithelialization circumferentially to the edges. No signs of infection. Appears well-healing. Electronic Signature(s) Signed: 12/08/2021 4:35:59 PM By: Thomas Shan DO Entered By: Thomas Gonzalez on 12/08/2021 16:33:07 -------------------------------------------------------------------------------- Physician Orders Details Patient Name: Date of Service: [name]. 12/08/2021 2:30 PM Medical Record Number: [id number] Patient Account Number: [id number] Date of Birth/Sex: Treating RN: Mar 29, 1966 (56 y.o. Thomas Gonzalez Primary Care Provider: Reinaldo Gonzalez Other Clinician: Referring Provider: Treating Provider/Extender: Thomas Gonzalez in Treatment:  24 Verbal / Phone Orders: No Diagnosis Coding  ICD-10 Coding Code Description T81.31XA Disruption of external operation (surgical) wound, not elsewhere classified, initial encounter L98.492 Non-pressure chronic ulcer of skin of other sites with fat layer exposed Z85.01 Personal history of malignant neoplasm of esophagus K43.9 Ventral hernia without obstruction or gangrene Follow-up Appointments Return appointment in 1 month. - Dr. Heber [place] Bathing/ Shower/ Hygiene May shower and wash wound with soap and water. - prior to dressing change Additional Orders / Instructions Follow Nutritious Diet - Increase protein Wound Treatment Wound #1 - Abdomen - midline Cleanser: Soap and Water Every Other Day/Other:90 days Discharge Instructions: May shower and wash wound with dial antibacterial soap and water prior to dressing change. Cleanser: Wound Cleanser Every Other Day/Other:90 days Discharge Instructions: Cleanse the wound with wound cleanser prior to applying a clean dressing using gauze sponges, not tissue or cotton balls. Prim Dressing: Hydrofera Blue Ready Foam, 4x5 in Every Other Day/Other:90 days ary Discharge Instructions: Apply to wound bed as instructed Secondary Dressing: ABD Pad, 5x9 Every Other Day/Other:90 days Discharge Instructions: Apply over primary dressing as directed. Secured With: 15M Medipore H Soft Cloth Surgical T 4 x 2 (in/yd) Every Other Day/Other:90 days ape Discharge Instructions: Secure dressing with tape as directed. Electronic Signature(s) Signed: 12/08/2021 4:35:59 PM By: Thomas Shan DO Entered By: Thomas Gonzalez on 12/08/2021 16:33:24 -------------------------------------------------------------------------------- Problem List Details Patient Name: Date of Service: [name] 12/08/2021 2:30 PM Medical Record Number: [id number] Patient Account Number: [id number] Date of Birth/Sex: Treating RN: 1965/12/29 (56 y.o. Thomas Gonzalez Primary  Care Provider: Reinaldo Gonzalez Other Clinician: Referring Provider: Treating Provider/Extender: Thomas Gonzalez in Treatment: 24 Active Problems ICD-10 Encounter Code Description Active Date MDM Diagnosis T81.31XA Disruption of external operation (surgical) wound, not elsewhere classified, 06/22/2021 No Yes initial encounter L98.492 Non-pressure chronic ulcer of skin of other sites with fat layer exposed 06/22/2021 No Yes Z85.01 Personal history of malignant neoplasm of esophagus 06/22/2021 No Yes K43.9 Ventral hernia without obstruction or gangrene 06/22/2021 No Yes Inactive Problems Resolved Problems Electronic Signature(s) Signed: 12/08/2021 4:35:59 PM By: Thomas Shan DO Entered By: Thomas Gonzalez on 12/08/2021 16:30:53 -------------------------------------------------------------------------------- Progress Note Details Patient Name: Date of Service: [name] 12/08/2021 2:30 PM Medical Record Number: [id number] Patient Account Number: [id number] Date of Birth/Sex: Treating RN: 12-15-1965 (56 y.o. Thomas Gonzalez Primary Care Provider: Reinaldo Gonzalez Other Clinician: Referring Provider: Treating Provider/Extender: Thomas Gonzalez in Treatment: 24 Subjective Chief Complaint Information obtained from Patient Surgical Ulcer History of Present Illness (HPI) 06/22/2021 patient presents today as a pleasant gentleman who unfortunately has been having quite a bit of an issue with his abdominal area following surgery in 2021 November. 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 Subsequently he also was on immunotherapy following to try to help with any residual carcinoma following the procedure. Nonetheless he had an issue with the incision site in the mid abdomen and unfortunately this ended up dehiscing and has had trouble since getting this healed. He has been seen by Dr. [name] him where ACell  was utilized but again there is really no response she referred the patient to [place] for further evaluation and treatment. No chemical cauterization with silver nitrate has been utilized. He also has not had any he tells me aggressive sharp debridement of the area to clear away some of this hypergranular tissue which he has significant amounts noted. Either way I think that we will get a need to try to see about  removing this I do think, to send sample of this to biopsy just to make sure there is no signs of cancer noted although I really think that is probably not can be the case I think we will get a get a report back stating hypergranulation tissue. With that being said the patient does seem to have quite a bit of bleeding and friability even with wiping over the wound area and again I think this is a big part of the issue again he can even grow new skin because anything attaching is attaching to the hypergranulation which is not even get a be anywhere close to sufficient for good epithelial growth. He also has had a CT scan in April which showed there was post esophagectomy with gastric pull-up anatomy but no evidence of local recurrence. There is no evidence of mediastinal nodal metastasis. He did have small bilateral pulmonary nodules which were unchanged recommend routine surveillance. Otherwise the patient has a ventral hernia following the surgery but no evidence of obstruction or gangrene. 06/28/2021 upon evaluation today patient's wound actually appears to be doing quite a bit better and it is hypergranular tissue is significantly improved compared to last time. With that being said they had a hard time getting the [facility] I am not sure exactly what all happened but the good news is that is something that should be on the way and he should have that if not today then tomorrow. Overall I think that with the [facility] this would have looked even better but nonetheless I am still pleased  with what I am seeing today. 07/06/2021 upon evaluation today patient's wound is actually showing signs of improvement and in general I feel like he is making excellent progress. The Hydrofera Blue dressing to be doing excellent for him it does cause a little bit of bleeding when he removes it during dressing changes but other than that he is doing quite well based on what I am seeing. Fortunately there does not appear to be any signs of active infection at this time. No fevers, chills, nausea, vomiting, or diarrhea. 07/13/2021 upon evaluation today patient appears to be doing well with regard to his abdominal ulcer. Overall I am extremely pleased with where things stand I think he is making good progress. I do think the Hydrofera Blue is doing a great job as far as treating this is concerned. I do believe that as long as he is seeing improvement we should continue as such. We may be able to contemplate some other options such as skin substitutes but right now when see how we do with this. 08/03/2021 upon evaluation today patient appears to be doing well with regard to his abdominal ulcer. Week by week this is showing signs of improvement which is good news. Overall the surface of the wound is also greatly improved. I am extremely pleased with where we stand today. 08/24/2021 upon evaluation today patient's wound on the abdominal area appears to be doing excellent today. Fortunately there is no signs of active infection and very pleased in that regard. There does not appear to be any signs of active infection systemically either which is also good news. No fevers, chills, nausea, vomiting, or diarrhea. Upon inspection patient's wound bed showed signs of good granulation epithelization at this point. Fortunately there does not appear to be any signs of active infection at this time which is great news. 10/27; patient presents for 3-week follow-up. He has been using Hydrofera Blue to the  wound bed without  issues. He has no complaints today. He denies signs of infection. 11/17; patient presents for 3-week follow-up. He has been doing well with Hydrofera Blue dressing changes. He has no issues or complaints today. He denies signs of infection. 12/8; patient presents for 3-week follow-up. He has been doing well with [facility] and has no issues or complaints today. 12/29; patient presents for 3-week follow-up. He continues to use [facility] without any issues. He has no concerns today. 1/19; patient presents for follow-up. He continues to use [facility] without issues. Patient History Information obtained from Patient. Family History Cancer - Siblings, No family history of Diabetes, Heart Disease, Hereditary Spherocytosis, Hypertension, Kidney Disease, Lung Disease, Seizures, Stroke, Thyroid Problems, Tuberculosis. Social History Former smoker, Marital Status - Married, Alcohol Use - Never, Drug Use - No History, Caffeine Use - Daily. Medical History Oncologic Patient has history of Received Chemotherapy - 09/20/20, Received Radiation Medical A Surgical History Notes nd Gastrointestinal esophageal carcinoma Objective Constitutional respirations regular, non-labored and within target range for patient.. Vitals Time Taken: 2:37 PM, Height: 69 in, Weight: 191 lbs, BMI: 28.2, Temperature: 98.6 F, Pulse: 79 bpm, Respiratory Rate: 18 breaths/min, Blood Pressure: 127/86 mmHg. Psychiatric pleasant and cooperative. General Notes: Abdomen: Large open wound with granulation tissue throughout and epithelialization circumferentially to the edges. No signs of infection. Appears well-healing. Integumentary (Hair, Skin) Wound #1 status is Open. Original cause of wound was Surgical Injury. The date acquired was: 09/30/2020. The wound has been in treatment 24 weeks. The wound is located on the Abdomen - midline. The wound measures 5.2cm length x 1.5cm width x 0.1cm depth; 6.126cm^2 area and  0.613cm^3 volume. There is Fat Layer (Subcutaneous Tissue) exposed. There is no tunneling or undermining noted. There is a medium amount of serosanguineous drainage noted. The wound margin is distinct with the outline attached to the wound base. There is large (67-100%) red, friable granulation within the wound bed. There is no necrotic tissue within the wound bed. Assessment Active Problems ICD-10 Disruption of external operation (surgical) wound, not elsewhere classified, initial encounter Non-pressure chronic ulcer of skin of other sites with fat layer exposed Personal history of malignant neoplasm of esophagus Ventral hernia without obstruction or gangrene Patient's wound continues to show improvement in size and appearance. I recommended continuing Hydrofera Blue. I recommended follow-up in 4 weeks and I am hopeful that will be healed by then. Plan Follow-up Appointments: Return appointment in 1 month. - Dr. Heber [place] Bathing/ Shower/ Hygiene: May shower and wash wound with soap and water. - prior to dressing change Additional Orders / Instructions: Follow Nutritious Diet - Increase protein WOUND #1: - Abdomen - midline Wound Laterality: Cleanser: Soap and Water Every Other Day/Other:90 days Discharge Instructions: May shower and wash wound with dial antibacterial soap and water prior to dressing change. Cleanser: Wound Cleanser Every Other Day/Other:90 days Discharge Instructions: Cleanse the wound with wound cleanser prior to applying a clean dressing using gauze sponges, not tissue or cotton balls. Prim Dressing: Hydrofera Blue Ready Foam, 4x5 in Every Other Day/Other:90 days ary Discharge Instructions: Apply to wound bed as instructed Secondary Dressing: ABD Pad, 5x9 Every Other Day/Other:90 days Discharge Instructions: Apply over primary dressing as directed. Secured With: 32M Medipore H Soft Cloth Surgical T 4 x 2 (in/yd) Every Other Day/Other:90 days ape Discharge  Instructions: Secure dressing with tape as directed. 1. Hydrofera Blue 2. Follow-up in 4 weeks Electronic Signature(s) Signed: 12/08/2021 4:35:59 PM By: Thomas Shan DO Entered By:  Thomas Gonzalez on 12/08/2021 16:34:02 -------------------------------------------------------------------------------- HxROS Details Patient Name: Date of Service: [name] 12/08/2021 2:30 PM Medical Record Number: [id number] Patient Account Number: [id number] Date of Birth/Sex: Treating RN: June 18, 1966 (56 y.o. Thomas Gonzalez Primary Care Provider: Reinaldo Gonzalez Other Clinician: Referring Provider: Treating Provider/Extender: Thomas Gonzalez in Treatment: 24 Information Obtained From Patient Gastrointestinal Medical History: Past Medical History Notes: esophageal carcinoma Oncologic Medical History: Positive for: Received Chemotherapy - 09/20/20; Received Radiation Immunizations Pneumococcal Vaccine: Received Pneumococcal Vaccination: No Implantable Devices Yes Family and Social History Cancer: Yes - Siblings; Diabetes: No; Heart Disease: No; Hereditary Spherocytosis: No; Hypertension: No; Kidney Disease: No; Lung Disease: No; Seizures: No; Stroke: No; Thyroid Problems: No; Tuberculosis: No; Former smoker; Marital Status - Married; Alcohol Use: Never; Drug Use: No History; Caffeine Use: Daily; Financial Concerns: No; Food, Clothing or Shelter Needs: No; Support System Lacking: No; Transportation Concerns: No Electronic Signature(s) Signed: 12/08/2021 4:35:59 PM By: Thomas Shan DO Signed: 12/12/2021 5:11:23 PM By: Levan Hurst RN, BSN Entered By: Thomas Gonzalez on 12/08/2021 16:31:41 -------------------------------------------------------------------------------- [place] Details Patient Name: Date of Service: [name]. 12/08/2021 Medical Record Number: [id number] Patient Account Number: [id number] Date of Birth/Sex: Treating  RN: 1966/07/07 (56 y.o. Thomas Gonzalez Primary Care Provider: Reinaldo Gonzalez Other Clinician: Referring Provider: Treating Provider/Extender: Thomas Gonzalez in Treatment: 24 Diagnosis Coding ICD-10 Codes Code Description T81.31XA Disruption of external operation (surgical) wound, not elsewhere classified, initial encounter L98.492 Non-pressure chronic ulcer of skin of other sites with fat layer exposed Z85.01 Personal history of malignant neoplasm of esophagus K43.9 Ventral hernia without obstruction or gangrene Facility Procedures CPT4 Code: [id number] Description: 99213 - WOUND CARE VISIT-LEV 3 EST PT Modifier: Quantity: 1 Physician Procedures : CPT4 Code Description Modifier [id number] - WC PHYS LEVEL 3 - EST PT ICD-10 Diagnosis Description T81.31XA Disruption of external operation (surgical) wound, not elsewhere classified, initial encounter L98.492 Non-pressure chronic ulcer of skin of  other sites with fat layer exposed Z85.01 Personal history of malignant neoplasm of esophagus K43.9 Ventral hernia without obstruction or gangrene Quantity: 1 Electronic Signature(s) Signed: 12/09/2021 9:10:14 AM By: Thomas Shan DO Signed: 12/12/2021 5:11:23 PM By: Levan Hurst RN, BSN Previous Signature: 12/08/2021 4:35:59 PM Version By: Thomas Shan DO Entered By: Levan Hurst on 12/08/2021 17:21:28

## 2021-12-29 ENCOUNTER — Encounter: Payer: Self-pay | Admitting: Oncology

## 2022-01-05 ENCOUNTER — Encounter (HOSPITAL_BASED_OUTPATIENT_CLINIC_OR_DEPARTMENT_OTHER): Payer: Managed Care, Other (non HMO) | Attending: Internal Medicine | Admitting: Internal Medicine

## 2022-01-05 ENCOUNTER — Other Ambulatory Visit: Payer: Self-pay

## 2022-01-05 DIAGNOSIS — X58XXXA Exposure to other specified factors, initial encounter: Secondary | ICD-10-CM | POA: Insufficient documentation

## 2022-01-05 DIAGNOSIS — Z8501 Personal history of malignant neoplasm of esophagus: Secondary | ICD-10-CM

## 2022-01-05 DIAGNOSIS — K439 Ventral hernia without obstruction or gangrene: Secondary | ICD-10-CM

## 2022-01-05 DIAGNOSIS — T8131XA Disruption of external operation (surgical) wound, not elsewhere classified, initial encounter: Secondary | ICD-10-CM | POA: Diagnosis present

## 2022-01-05 DIAGNOSIS — L98492 Non-pressure chronic ulcer of skin of other sites with fat layer exposed: Secondary | ICD-10-CM | POA: Diagnosis not present

## 2022-01-05 NOTE — Progress Notes (Signed)
Thomas Gonzalez, Thomas Gonzalez ([id number]) Visit Report for 01/05/2022 Chief Complaint Document Details Patient Name: Date of Service: [name] 01/05/2022 2:30 PM Medical Record Number: [id number] Patient Account Number: [id number] Date of Birth/Sex: Treating RN: 1966-07-07 (56 y.o. Thomas Gonzalez Primary Care Provider: [name] Other Clinician: Referring Provider: Treating Provider/Extender: [name] in Treatment: 28 Information Obtained from: Patient Chief Complaint Surgical Ulcer Electronic Signature(s) Signed: 01/05/2022 4:05:44 PM By: [name] DO Entered By: [name] on 01/05/2022 16:01:29 -------------------------------------------------------------------------------- HPI Details Patient Name: Date of Service: [name]. 01/05/2022 2:30 PM Medical Record Number: [id number] Patient Account Number: [id number] Date of Birth/Sex: Treating RN: September 30, 1966 (56 y.o. Thomas Gonzalez Primary Care Provider: [name] Other Clinician: Referring Provider: Treating Provider/Extender: [name] in Treatment: 28 History of Present Illness HPI Description: 06/22/2021 patient presents today as a pleasant gentleman who unfortunately has been having quite a bit of an issue with his abdominal area following surgery in 2021 November. 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 Subsequently he also was on immunotherapy following to try to help with any residual carcinoma following the procedure. Nonetheless he had an issue with the incision site in the mid abdomen and unfortunately this ended up dehiscing and has had trouble since getting this healed. He has been seen by Dr. [name] him where ACell was utilized but again there is really no response she referred the patient to [place] for further evaluation and treatment. No chemical cauterization with silver nitrate has  been utilized. He also has not had any he tells me aggressive sharp debridement of the area to clear away some of this hypergranular tissue which he has significant amounts noted. Either way I think that we will get a need to try to see about removing this I do think, to send sample of this to biopsy just to make sure there is no signs of cancer noted although I really think that is probably not can be the case I think we will get a get a report back stating hypergranulation tissue. With that being said the patient does seem to have quite a bit of bleeding and friability even with wiping over the wound area and again I think this is a big part of the issue again he can even grow new skin because anything attaching is attaching to the hypergranulation which is not even get a be anywhere close to sufficient for good epithelial growth. He also has had a CT scan in April which showed there was post esophagectomy with gastric pull-up anatomy but no evidence of local recurrence. There is no evidence of mediastinal nodal metastasis. He did have small bilateral pulmonary nodules which were unchanged recommend routine surveillance. Otherwise the patient has a ventral hernia following the surgery but no evidence of obstruction or gangrene. 06/28/2021 upon evaluation today patient's wound actually appears to be doing quite a bit better and it is hypergranular tissue is significantly improved compared to last time. With that being said they had a hard time getting the [facility] I am not sure exactly what all happened but the good news is that is something that should be on the way and he should have that if not today then tomorrow. Overall I think that with the [facility] this would have looked even better but nonetheless I am still pleased with what I am seeing today. 07/06/2021 upon evaluation today patient's wound is actually showing  signs of improvement and in general I feel like he is making excellent  progress. The Hydrofera Blue dressing to be doing excellent for him it does cause a little bit of bleeding when he removes it during dressing changes but other than that he is doing quite well based on what I am seeing. Fortunately there does not appear to be any signs of active infection at this time. No fevers, chills, nausea, vomiting, or diarrhea. 07/13/2021 upon evaluation today patient appears to be doing well with regard to his abdominal ulcer. Overall I am extremely pleased with where things stand I think he is making good progress. I do think the Hydrofera Blue is doing a great job as far as treating this is concerned. I do believe that as long as he is seeing improvement we should continue as such. We may be able to contemplate some other options such as skin substitutes but right now when see how we do with this. 08/03/2021 upon evaluation today patient appears to be doing well with regard to his abdominal ulcer. Week by week this is showing signs of improvement which is good news. Overall the surface of the wound is also greatly improved. I am extremely pleased with where we stand today. 08/24/2021 upon evaluation today patient's wound on the abdominal area appears to be doing excellent today. Fortunately there is no signs of active infection and very pleased in that regard. There does not appear to be any signs of active infection systemically either which is also good news. No fevers, chills, nausea, vomiting, or diarrhea. Upon inspection patient's wound bed showed signs of good granulation epithelization at this point. Fortunately there does not appear to be any signs of active infection at this time which is great news. 10/27; patient presents for 3-week follow-up. He has been using Hydrofera Blue to the wound bed without issues. He has no complaints today. He denies signs of infection. 11/17; patient presents for 3-week follow-up. He has been doing well with Hydrofera Blue dressing  changes. He has no issues or complaints today. He denies signs of infection. 12/8; patient presents for 3-week follow-up. He has been doing well with [facility] and has no issues or complaints today. 12/29; patient presents for 3-week follow-up. He continues to use [facility] without any issues. He has no concerns today. 1/19; patient presents for follow-up. He continues to use [facility] without issues. 2/16; patient presents for follow-up. He continues to use [facility] without issues. He states he is scheduled to have hernia repair surgery on 3/27. He has no issues or complaints today. Electronic Signature(s) Signed: 01/05/2022 4:05:44 PM By: [name] DO Entered By: [name] on 01/05/2022 16:02:36 -------------------------------------------------------------------------------- Physical Exam Details Patient Name: Date of Service: [name] 01/05/2022 2:30 PM Medical Record Number: [id number] Patient Account Number: [id number] Date of Birth/Sex: Treating RN: 06/29/1966 (56 y.o. Thomas Gonzalez Primary Care Provider: [name] Other Clinician: Referring Provider: Treating Provider/Extender: [name] in Treatment: 28 Constitutional respirations regular, non-labored and within target range for patient.Marland Kitchen Psychiatric pleasant and cooperative. Notes Abdomen: Open wound with granulation tissue throughout. Appears well-healing. No signs of surrounding infection. Electronic Signature(s) Signed: 01/05/2022 4:05:44 PM By: [name] DO Entered By: [name] on 01/05/2022 16:03:06 -------------------------------------------------------------------------------- Physician Orders Details Patient Name: Date of Service: [name]. 01/05/2022 2:30 PM Medical Record Number: [id number] Patient Account Number: [id number] Date of Birth/Sex: Treating RN: August 27, 1966 (56 y.o. [name], [name] Primary  Care  Provider: [name] Other Clinician: Referring Provider: Treating Provider/Extender: [name] in Treatment: 28 Verbal / Phone Orders: No Diagnosis Coding ICD-10 Coding Code Description T81.31XA Disruption of external operation (surgical) wound, not elsewhere classified, initial encounter L98.492 Non-pressure chronic ulcer of skin of other sites with fat layer exposed Z85.01 Personal history of malignant neoplasm of esophagus K43.9 Ventral hernia without obstruction or gangrene Follow-up Appointments Return appointment in 1 month. - Dr. Heber [place] Room 8 [name], RN) Bathing/ Shower/ Hygiene May shower and wash wound with soap and water. - prior to dressing change Additional Orders / Instructions Follow Nutritious Diet - Increase protein Wound Treatment Wound #1 - Abdomen - midline Cleanser: Soap and Water Every Other Day/Other:90 days Discharge Instructions: May shower and wash wound with dial antibacterial soap and water prior to dressing change. Cleanser: Wound Cleanser Every Other Day/Other:90 days Discharge Instructions: Cleanse the wound with wound cleanser prior to applying a clean dressing using gauze sponges, not tissue or cotton balls. Prim Dressing: Hydrofera Blue Ready Foam, 4x5 in Every Other Day/Other:90 days ary Discharge Instructions: Apply to wound bed as instructed Secondary Dressing: ABD Pad, 5x9 Every Other Day/Other:90 days Discharge Instructions: Apply over primary dressing as directed. Secured With: 17M Medipore H Soft Cloth Surgical T 4 x 2 (in/yd) Every Other Day/Other:90 days ape Discharge Instructions: Secure dressing with tape as directed. Electronic Signature(s) Signed: 01/05/2022 4:05:44 PM By: [name] DO Previous Signature: 01/05/2022 4:02:17 PM Version By: Deon Pilling RN, BSN Entered By: [name] on 01/05/2022  16:03:21 -------------------------------------------------------------------------------- Problem List Details Patient Name: Date of Service: [name]. 01/05/2022 2:30 PM Medical Record Number: [id number] Patient Account Number: [id number] Date of Birth/Sex: Treating RN: 10/02/1966 (56 y.o. [name], [name] Primary Care Provider: [name] Other Clinician: Referring Provider: Treating Provider/Extender: [name] in Treatment: 28 Active Problems ICD-10 Encounter Code Description Active Date MDM Diagnosis T81.31XA Disruption of external operation (surgical) wound, not elsewhere classified, 06/22/2021 No Yes initial encounter L98.492 Non-pressure chronic ulcer of skin of other sites with fat layer exposed 06/22/2021 No Yes Z85.01 Personal history of malignant neoplasm of esophagus 06/22/2021 No Yes K43.9 Ventral hernia without obstruction or gangrene 06/22/2021 No Yes Inactive Problems Resolved Problems Electronic Signature(s) Signed: 01/05/2022 4:05:44 PM By: [name] DO Entered By: [name] on 01/05/2022 16:01:16 -------------------------------------------------------------------------------- Progress Note Details Patient Name: Date of Service: [name]. 01/05/2022 2:30 PM Medical Record Number: [id number] Patient Account Number: [id number] Date of Birth/Sex: Treating RN: [birth year] (56 y.o. Thomas Gonzalez Primary Care Provider: [name] Other Clinician: Referring Provider: Treating Provider/Extender: [name] in Treatment: 28 Subjective Chief Complaint Information obtained from Patient Surgical Ulcer History of Present Illness (HPI) 06/22/2021 patient presents today as a pleasant gentleman who unfortunately has been having quite a bit of an issue with his abdominal area following surgery in 2021 November. He had a transhiatal esophagectomy due to adenocarcinoma of the  esophagus. Subsequently he also was on immunotherapy following to try to help with any residual carcinoma following the procedure. Nonetheless he had an issue with the incision site in the mid abdomen and unfortunately this ended up dehiscing and has had trouble since getting this healed. He has been seen by Dr. [name] him where ACell was utilized but again there is really no response she referred the patient to [place] for further evaluation and treatment. No chemical cauterization with silver nitrate has been utilized. He also has  not had any he tells me aggressive sharp debridement of the area to clear away some of this hypergranular tissue which he has significant amounts noted. Either way I think that we will get a need to try to see about removing this I do think, to send sample of this to biopsy just to make sure there is no signs of cancer noted although I really think that is probably not can be the case I think we will get a get a report back stating hypergranulation tissue. With that being said the patient does seem to have quite a bit of bleeding and friability even with wiping over the wound area and again I think this is a big part of the issue again he can even grow new skin because anything attaching is attaching to the hypergranulation which is not even get a be anywhere close to sufficient for good epithelial growth. He also has had a CT scan in April which showed there was post esophagectomy with gastric pull-up anatomy but no evidence of local recurrence. There is no evidence of mediastinal nodal metastasis. He did have small bilateral pulmonary nodules which were unchanged recommend routine surveillance. Otherwise the patient has a ventral hernia following the surgery but no evidence of obstruction or gangrene. 06/28/2021 upon evaluation today patient's wound actually appears to be doing quite a bit better and it is hypergranular tissue is significantly improved compared to last time.  With that being said they had a hard time getting the [facility] I am not sure exactly what all happened but the good news is that is something that should be on the way and he should have that if not today then tomorrow. Overall I think that with the [facility] this would have looked even better but nonetheless I am still pleased with what I am seeing today. 07/06/2021 upon evaluation today patient's wound is actually showing signs of improvement and in general I feel like he is making excellent progress. The Hydrofera Blue dressing to be doing excellent for him it does cause a little bit of bleeding when he removes it during dressing changes but other than that he is doing quite well based on what I am seeing. Fortunately there does not appear to be any signs of active infection at this time. No fevers, chills, nausea, vomiting, or diarrhea. 07/13/2021 upon evaluation today patient appears to be doing well with regard to his abdominal ulcer. Overall I am extremely pleased with where things stand I think he is making good progress. I do think the Hydrofera Blue is doing a great job as far as treating this is concerned. I do believe that as long as he is seeing improvement we should continue as such. We may be able to contemplate some other options such as skin substitutes but right now when see how we do with this. 08/03/2021 upon evaluation today patient appears to be doing well with regard to his abdominal ulcer. Week by week this is showing signs of improvement which is good news. Overall the surface of the wound is also greatly improved. I am extremely pleased with where we stand today. 08/24/2021 upon evaluation today patient's wound on the abdominal area appears to be doing excellent today. Fortunately there is no signs of active infection and very pleased in that regard. There does not appear to be any signs of active infection systemically either which is also good news. No fevers,  chills, nausea, vomiting, or diarrhea. Upon inspection patient's wound bed  showed signs of good granulation epithelization at this point. Fortunately there does not appear to be any signs of active infection at this time which is great news. 10/27; patient presents for 3-week follow-up. He has been using Hydrofera Blue to the wound bed without issues. He has no complaints today. He denies signs of infection. 11/17; patient presents for 3-week follow-up. He has been doing well with Hydrofera Blue dressing changes. He has no issues or complaints today. He denies signs of infection. 12/8; patient presents for 3-week follow-up. He has been doing well with [facility] and has no issues or complaints today. 12/29; patient presents for 3-week follow-up. He continues to use [facility] without any issues. He has no concerns today. 1/19; patient presents for follow-up. He continues to use [facility] without issues. 2/16; patient presents for follow-up. He continues to use [facility] without issues. He states he is scheduled to have hernia repair surgery on 3/27. He has no issues or complaints today. Patient History Information obtained from Patient. Family History Cancer - Siblings, No family history of Diabetes, Heart Disease, Hereditary Spherocytosis, Hypertension, Kidney Disease, Lung Disease, Seizures, Stroke, Thyroid Problems, Tuberculosis. Social History Former smoker, Marital Status - Married, Alcohol Use - Never, Drug Use - No History, Caffeine Use - Daily. Medical History Oncologic Patient has history of Received Chemotherapy - 09/20/20, Received Radiation Medical A Surgical History Notes nd Gastrointestinal esophageal carcinoma Objective Constitutional respirations regular, non-labored and within target range for patient.. Vitals Time Taken: 2:38 PM, Height: 69 in, Weight: 191 lbs, BMI: 28.2, Temperature: 97.6 F, Pulse: 73 bpm, Respiratory Rate: 20 breaths/min, Blood  Pressure: 114/77 mmHg. Psychiatric pleasant and cooperative. General Notes: Abdomen: Open wound with granulation tissue throughout. Appears well-healing. No signs of surrounding infection. Integumentary (Hair, Skin) Wound #1 status is Open. Original cause of wound was Surgical Injury. The date acquired was: 09/30/2020. The wound has been in treatment 28 weeks. The wound is located on the Abdomen - midline. The wound measures 3.8cm length x 2.7cm width x 0.1cm depth; 8.058cm^2 area and 0.806cm^3 volume. There is Fat Layer (Subcutaneous Tissue) exposed. There is no tunneling or undermining noted. There is a medium amount of serosanguineous drainage noted. The wound margin is distinct with the outline attached to the wound base. There is large (67-100%) red, friable granulation within the wound bed. There is no necrotic tissue within the wound bed. Assessment Active Problems ICD-10 Disruption of external operation (surgical) wound, not elsewhere classified, initial encounter Non-pressure chronic ulcer of skin of other sites with fat layer exposed Personal history of malignant neoplasm of esophagus Ventral hernia without obstruction or gangrene Patient continues to do very well with Hydrofera Blue. Size continues to decrease. I recommended continuing Hydrofera Blue. He is scheduled for hernia repair surgery in a little over a month. I will see him 1 time before the surgery. Follow-up in 4 weeks. Plan Follow-up Appointments: Return appointment in 1 month. - Dr. Heber [place] Room 8 [name], RN) Bathing/ Shower/ Hygiene: May shower and wash wound with soap and water. - prior to dressing change Additional Orders / Instructions: Follow Nutritious Diet - Increase protein WOUND #1: - Abdomen - midline Wound Laterality: Cleanser: Soap and Water Every Other Day/Other:90 days Discharge Instructions: May shower and wash wound with dial antibacterial soap and water prior to dressing change. Cleanser: Wound  Cleanser Every Other Day/Other:90 days Discharge Instructions: Cleanse the wound with wound cleanser prior to applying a clean dressing using gauze sponges, not tissue or cotton balls.  Prim Dressing: Hydrofera Blue Ready Foam, 4x5 in Every Other Day/Other:90 days ary Discharge Instructions: Apply to wound bed as instructed Secondary Dressing: ABD Pad, 5x9 Every Other Day/Other:90 days Discharge Instructions: Apply over primary dressing as directed. Secured With: 56M Medipore H Soft Cloth Surgical T 4 x 2 (in/yd) Every Other Day/Other:90 days ape Discharge Instructions: Secure dressing with tape as directed. 1. Hydrofera Blue 2. Follow-up in 1 month 3. Patient knows to call with any questions or concerns and can be seen sooner Electronic Signature(s) Signed: 01/05/2022 4:05:44 PM By: [name] DO Entered By: [name] on 01/05/2022 16:04:34 -------------------------------------------------------------------------------- HxROS Details Patient Name: Date of Service: [name] 01/05/2022 2:30 PM Medical Record Number: [id number] Patient Account Number: [id number] Date of Birth/Sex: Treating RN: 12/04/1965 (56 y.o. Thomas Gonzalez Primary Care Provider: [name] Other Clinician: Referring Provider: Treating Provider/Extender: [name] in Treatment: 28 Information Obtained From Patient Gastrointestinal Medical History: Past Medical History Notes: esophageal carcinoma Oncologic Medical History: Positive for: Received Chemotherapy - 09/20/20; Received Radiation Immunizations Pneumococcal Vaccine: Received Pneumococcal Vaccination: No Implantable Devices Yes Family and Social History Cancer: Yes - Siblings; Diabetes: No; Heart Disease: No; Hereditary Spherocytosis: No; Hypertension: No; Kidney Disease: No; Lung Disease: No; Seizures: No; Stroke: No; Thyroid Problems: No; Tuberculosis: No; Former smoker; Marital Status -  Married; Alcohol Use: Never; Drug Use: No History; Caffeine Use: Daily; Financial Concerns: No; Food, Clothing or Shelter Needs: No; Support System Lacking: No; Transportation Concerns: No Electronic Signature(s) Signed: 01/05/2022 4:05:44 PM By: [name] DO Signed: 01/05/2022 6:28:13 PM By: Levan Hurst RN, BSN Entered By: [name] on 01/05/2022 16:02:42 -------------------------------------------------------------------------------- SuperBill Details Patient Name: Date of Service: [name]. 01/05/2022 Medical Record Number: [id number] Patient Account Number: [id number] Date of Birth/Sex: Treating RN: [birth year] (56 y.o. [name] Primary Care Provider: [name] Other Clinician: Referring Provider: Treating Provider/Extender: [name] in Treatment: 28 Diagnosis Coding ICD-10 Codes Code Description T81.31XA Disruption of external operation (surgical) wound, not elsewhere classified, initial encounter L98.492 Non-pressure chronic ulcer of skin of other sites with fat layer exposed Z85.01 Personal history of malignant neoplasm of esophagus K43.9 Ventral hernia without obstruction or gangrene Facility Procedures CPT4 Code: [id number] Description: 9213 - WOUND CARE VISIT-LEV 3 EST PT Modifier: Quantity: 1 Physician Procedures : CPT4 Code Description Modifier [id number] - WC PHYS LEVEL 3 - EST PT ICD-10 Diagnosis Description T81.31XA Disruption of external operation (surgical) wound, not elsewhere classified, initial encounter L98.492 Non-pressure chronic ulcer of skin of  other sites with fat layer exposed Z85.01 Personal history of malignant neoplasm of esophagus K43.9 Ventral hernia without obstruction or gangrene Quantity: 1 Electronic Signature(s) Signed: 01/05/2022 4:05:44 PM By: [name] DO Previous Signature: 01/05/2022 4:02:17 PM Version By: Deon Pilling RN, BSN Entered By: [name] on  01/05/2022 16:04:51

## 2022-01-05 NOTE — Progress Notes (Signed)
TONG, PIECZYNSKI ([id number]) Visit Report for 01/05/2022 Arrival Information Details Patient Name: Date of Service: [name] 01/05/2022 2:30 PM Medical Record Number: [id number] Patient Account Number: [id number] Date of Birth/Sex: Treating RN: 1966/06/05 (56 y.o. Thomas Gonzalez, Thomas Gonzalez Primary Care Thomas Gonzalez: Thomas Gonzalez Other Clinician: Referring Thomas Gonzalez: Treating Thomas Gonzalez/Extender: Thomas Gonzalez in Treatment: 28 Visit Information History Since Last Visit Added or deleted any medications: No Patient Arrived: Ambulatory Any new allergies or adverse reactions: No Arrival Time: 14:37 Had a fall or experienced change in No Accompanied By: wife activities of daily living that may affect Transfer Assistance: None risk of falls: Patient Identification Verified: Yes Signs or symptoms of abuse/neglect since last visito No Secondary Verification Process Completed: Yes Hospitalized since last visit: No Patient Requires Transmission-Based Precautions: No Implantable device outside of the clinic excluding No Patient Has Alerts: No cellular tissue based products placed in the center since last visit: Has Dressing in Place as Prescribed: Yes Pain Present Now: No Notes February 13, 2022 hernia surgery scheduled. Electronic Signature(s) Signed: 01/05/2022 4:02:17 PM By: Thomas Pilling RN, BSN Entered By: Thomas Gonzalez on 01/05/2022 14:38:09 -------------------------------------------------------------------------------- Clinic Level of Care Assessment Details Patient Name: Date of Service: [name] 01/05/2022 2:30 PM Medical Record Number: [id number] Patient Account Number: [id number] Date of Birth/Sex: Treating RN: 01-09-1966 (56 y.o. Thomas Gonzalez, Thomas Gonzalez Primary Care Thomas Gonzalez: Thomas Gonzalez Other Clinician: Referring Thomas Gonzalez: Treating Thomas Gonzalez/Extender: Thomas Gonzalez in Treatment: 28 Clinic Level of Care Assessment  Items TOOL 4 Quantity Score X- 1 0 Use when only an EandM is performed on FOLLOW-UP visit ASSESSMENTS - Nursing Assessment / Reassessment X- 1 10 Reassessment of Co-morbidities (includes updates in patient status) X- 1 5 Reassessment of Adherence to Treatment Plan ASSESSMENTS - Wound and Skin A ssessment / Reassessment X - Simple Wound Assessment / Reassessment - one wound 1 5 []  - 0 Complex Wound Assessment / Reassessment - multiple wounds X- 1 10 Dermatologic / Skin Assessment (not related to wound area) ASSESSMENTS - Focused Assessment []  - 0 Circumferential Edema Measurements - multi extremities X- 1 10 Nutritional Assessment / Counseling / Intervention []  - 0 Lower Extremity Assessment (monofilament, tuning fork, pulses) []  - 0 Peripheral Arterial Disease Assessment (using hand held doppler) ASSESSMENTS - Ostomy and/or Continence Assessment and Care []  - 0 Incontinence Assessment and Management []  - 0 Ostomy Care Assessment and Management (repouching, etc.) PROCESS - Coordination of Care X - Simple Patient / Family Education for ongoing care 1 15 []  - 0 Complex (extensive) Patient / Family Education for ongoing care X- 1 10 Staff obtains Programmer, systems, Records, T Results / Process Orders est []  - 0 Staff telephones HHA, Nursing Homes / Clarify orders / etc []  - 0 Routine Transfer to another Facility (non-emergent condition) []  - 0 Routine Hospital Admission (non-emergent condition) []  - 0 New Admissions / Biomedical engineer / Ordering NPWT Apligraf, etc. , []  - 0 Emergency Hospital Admission (emergent condition) X- 1 10 Simple Discharge Coordination []  - 0 Complex (extensive) Discharge Coordination PROCESS - Special Needs []  - 0 Pediatric / Minor Patient Management []  - 0 Isolation Patient Management []  - 0 Hearing / Language / Visual special needs []  - 0 Assessment of Community assistance (transportation, D/C planning, etc.) []  - 0 Additional  assistance / Altered mentation []  - 0 Support Surface(s) Assessment (bed, cushion, seat, etc.) INTERVENTIONS - Wound Cleansing / Measurement X - Simple Wound Cleansing - one wound 1 5 []  -  0 Complex Wound Cleansing - multiple wounds X- 1 5 Wound Imaging (photographs - any number of wounds) []  - 0 Wound Tracing (instead of photographs) X- 1 5 Simple Wound Measurement - one wound []  - 0 Complex Wound Measurement - multiple wounds INTERVENTIONS - Wound Dressings X - Small Wound Dressing one or multiple wounds 1 10 []  - 0 Medium Wound Dressing one or multiple wounds []  - 0 Large Wound Dressing one or multiple wounds []  - 0 Application of Medications - topical []  - 0 Application of Medications - injection INTERVENTIONS - Miscellaneous []  - 0 External ear exam []  - 0 Specimen Collection (cultures, biopsies, blood, body fluids, etc.) []  - 0 Specimen(s) / Culture(s) sent or taken to Lab for analysis []  - 0 Patient Transfer (multiple staff / Civil Service fast streamer / Similar devices) []  - 0 Simple Staple / Suture removal (25 or less) []  - 0 Complex Staple / Suture removal (26 or more) []  - 0 Hypo / Hyperglycemic Management (close monitor of Blood Glucose) []  - 0 Ankle / Brachial Index (ABI) - do not check if billed separately X- 1 5 Vital Signs Has the patient been seen at the hospital within the last three years: Yes Total Score: 105 Level Of Care: New/Established - Level 3 Electronic Signature(s) Signed: 01/05/2022 4:02:17 PM By: Thomas Pilling RN, BSN Entered By: Thomas Gonzalez on 01/05/2022 15:09:35 -------------------------------------------------------------------------------- Encounter Discharge Information Details Patient Name: Date of Service: [name] K. 01/05/2022 2:30 PM Medical Record Number: [id number] Patient Account Number: [id number] Date of Birth/Sex: Treating RN: 10-18-1966 (56 y.o. Thomas Gonzalez Primary Care Nalu Gonzalez: Thomas Gonzalez Other  Clinician: Referring Thomas Gonzalez: Treating Thomas Gonzalez/Extender: Thomas Gonzalez in Treatment: 28 Encounter Discharge Information Items Discharge Condition: Stable Ambulatory Status: Ambulatory Discharge Destination: Home Transportation: Private Auto Accompanied By: wife Schedule Follow-up Appointment: Yes Clinical Summary of Care: Electronic Signature(s) Signed: 01/05/2022 4:02:17 PM By: Thomas Pilling RN, BSN Entered By: Thomas Gonzalez on 01/05/2022 15:10:22 -------------------------------------------------------------------------------- Lower Extremity Assessment Details Patient Name: Date of Service: [name] K. 01/05/2022 2:30 PM Medical Record Number: [id number] Patient Account Number: [id number] Date of Birth/Sex: Treating RN: 05-11-1966 (56 y.o. Thomas Gonzalez Primary Care Elenore Wanninger: Thomas Gonzalez Other Clinician: Referring Seena Ritacco: Treating Emil Klassen/Extender: Thomas Gonzalez in Treatment: 28 Electronic Signature(s) Signed: 01/05/2022 4:02:17 PM By: Thomas Pilling RN, BSN Entered By: Thomas Gonzalez on 01/05/2022 14:40:31 -------------------------------------------------------------------------------- Multi Wound Chart Details Patient Name: Date of Service: [name] K. 01/05/2022 2:30 PM Medical Record Number: [id number] Patient Account Number: [id number] Date of Birth/Sex: Treating RN: 05-11-1966 (56 y.o. [name] Primary Care Imelda Dandridge: Thomas Gonzalez Other Clinician: Referring Mckenize Mezera: Treating Langston [place]/Extender: Thomas Gonzalez in Treatment: 28 Vital Signs Height(in): 69 Pulse(bpm): 76 Weight(lbs): 191 Blood Pressure(mmHg): 114/77 Body Mass Index(BMI): 28.2 Temperature(F): 97.6 Respiratory Rate(breaths/min): 20 Photos: [N/A:N/A] Abdomen - midline N/A N/A Wound Location: Surgical Injury N/A N/A Wounding Event: Open Surgical Wound N/A N/A Primary  Etiology: Received Chemotherapy, Received N/A N/A Comorbid History: Radiation 09/30/2020 N/A N/A Date Acquired: 27 N/A N/A Weeks of Treatment: Open N/A N/A Wound Status: No N/A N/A Wound Recurrence: 3.8x2.7x0.1 N/A N/A Measurements L x W x D (cm) 8.058 N/A N/A A (cm) : rea 0.806 N/A N/A Volume (cm) : 84.50% N/A N/A % Reduction in Area: 84.50% N/A N/A % Reduction in Volume: Full Thickness Without Exposed N/A N/A Classification: Support Structures Medium N/A N/A Exudate Amount: Serosanguineous N/A N/A Exudate Type: red, brown N/A  N/A Exudate Color: Distinct, outline attached N/A N/A Wound Margin: Large (67-100%) N/A N/A Granulation Amount: Red, Friable N/A N/A Granulation Quality: None Present (0%) N/A N/A Necrotic Amount: Fat Layer (Subcutaneous Tissue): Yes N/A N/A Exposed Structures: Fascia: No Tendon: No Muscle: No Joint: No Bone: No Large (67-100%) N/A N/A Epithelialization: Treatment Notes Wound #1 (Abdomen - midline) Cleanser Soap and Water Discharge Instruction: May shower and wash wound with dial antibacterial soap and water prior to dressing change. Wound Cleanser Discharge Instruction: Cleanse the wound with wound cleanser prior to applying a clean dressing using gauze sponges, not tissue or cotton balls. Peri-Wound Care Topical Primary Dressing Hydrofera Blue Ready Foam, 4x5 in Discharge Instruction: Apply to wound bed as instructed Secondary Dressing ABD Pad, 5x9 Discharge Instruction: Apply over primary dressing as directed. Secured With 14M Medipore H Soft Cloth Surgical T 4 x 2 (in/yd) ape Discharge Instruction: Secure dressing with tape as directed. Compression Wrap Compression Stockings Add-Ons Electronic Signature(s) Signed: 01/05/2022 4:05:44 PM By: [name] DO Signed: 01/05/2022 6:28:13 PM By: Levan Hurst RN, BSN Entered By: [name] on 01/05/2022  16:01:22 -------------------------------------------------------------------------------- Multi-Disciplinary Care Plan Details Patient Name: Date of Service: [name] K. 01/05/2022 2:30 PM Medical Record Number: [id number] Patient Account Number: [id number] Date of Birth/Sex: Treating RN: 09/23/1966 (56 y.o. Thomas Gonzalez Primary Care Orlander Norwood: Thomas Gonzalez Other Clinician: Referring Makennah Omura: Treating Moria Brophy/Extender: Thomas Gonzalez in Treatment: [place] reviewed with physician Active Inactive Wound/Skin Impairment Nursing Diagnoses: Impaired tissue integrity Knowledge deficit related to ulceration/compromised skin integrity Goals: Patient/caregiver will verbalize understanding of skin care regimen Date Initiated: 06/22/2021 Target Resolution Date: 02/16/2022 Goal Status: Active Ulcer/skin breakdown will have a volume reduction of 30% by week 4 Date Initiated: 06/22/2021 Date Inactivated: 08/03/2021 Target Resolution Date: 07/20/2021 Goal Status: Met Ulcer/skin breakdown will have a volume reduction of 50% by week 8 Date Initiated: 08/03/2021 Date Inactivated: 08/24/2021 Target Resolution Date: 08/17/2021 Unmet Reason: 30% both volume Goal Status: Unmet reduction. wound has made improvement see wound chart. Interventions: Assess patient/caregiver ability to obtain necessary supplies Assess patient/caregiver ability to perform ulcer/skin care regimen upon admission and as needed Assess ulceration(s) every visit Provide education on ulcer and skin care Treatment Activities: Skin care regimen initiated : 06/22/2021 Topical wound management initiated : 06/22/2021 Notes: 11/17/21: Wound care regimen continues, patient compliant with dressings. Electronic Signature(s) Signed: 01/05/2022 4:02:17 PM By: Thomas Pilling RN, BSN Entered By: Thomas Gonzalez on 01/05/2022  14:51:29 -------------------------------------------------------------------------------- Pain Assessment Details Patient Name: Date of Service: [name]. 01/05/2022 2:30 PM Medical Record Number: [id number] Patient Account Number: [id number] Date of Birth/Sex: Treating RN: [birth year] (56 y.o. Thomas Gonzalez Primary Care Avenir Lozinski: Thomas Gonzalez Other Clinician: Referring Keiyon Plack: Treating Arihana Ambrocio/Extender: Thomas Gonzalez in Treatment: 28 Active Problems Location of Pain Severity and Description of Pain Patient Has Paino No Site Locations Rate the pain. Current Pain Level: 0 Pain Management and Medication Current Pain Management: Medication: No Cold Application: No Rest: No Massage: No Activity: No T.E.N.S.: No Heat Application: No Leg drop or elevation: No Is the Current Pain Management Adequate: Adequate How does your wound impact your activities of daily livingo Sleep: No Bathing: No Appetite: No Relationship With Others: No Bladder Continence: No Emotions: No Bowel Continence: No Work: No Toileting: No Drive: No Dressing: No Hobbies: No Engineer, maintenance) Signed: 01/05/2022 4:02:17 PM By: Thomas Pilling RN, BSN Entered By: Thomas Gonzalez on 01/05/2022 14:40:25 -------------------------------------------------------------------------------- Patient/Caregiver Education Details Patient  Name: Date of Service: [name] 2/16/2023andnbsp2:30 PM Medical Record Number: [id number] Patient Account Number: [id number] Date of Birth/Gender: Treating RN: September 24, 1966 (56 y.o. Thomas Gonzalez Primary Care Physician: Thomas Gonzalez Other Clinician: Referring Physician: Treating Physician/Extender: Thomas Gonzalez in Treatment: 28 Education Assessment Education Provided To: Patient Education Topics Provided Wound/Skin Impairment: Handouts: Skin Care Do's and Dont's Methods:  Explain/Verbal Responses: Reinforcements needed Electronic Signature(s) Signed: 01/05/2022 4:02:17 PM By: Thomas Pilling RN, BSN Entered By: Thomas Gonzalez on 01/05/2022 14:51:43 -------------------------------------------------------------------------------- Wound Assessment Details Patient Name: Date of Service: [name]. 01/05/2022 2:30 PM Medical Record Number: [id number] Patient Account Number: [id number] Date of Birth/Sex: Treating RN: 05/31/1966 (56 y.o. Thomas Gonzalez, Thomas Gonzalez Primary Care Eschol Auxier: Thomas Gonzalez Other Clinician: Referring Celia Friedland: Treating Elajah Kunsman/Extender: Thomas Gonzalez in Treatment: 28 Wound Status Wound Number: 1 Primary Etiology: Open Surgical Wound Wound Location: Abdomen - midline Wound Status: Open Wounding Event: Surgical Injury Comorbid History: Received Chemotherapy, Received Radiation Date Acquired: 09/30/2020 Weeks Of Treatment: 28 Clustered Wound: No Photos Wound Measurements Length: (cm) 3.8 Width: (cm) 2.7 Depth: (cm) 0.1 Area: (cm) 8.058 Volume: (cm) 0.806 % Reduction in Area: 84.5% % Reduction in Volume: 84.5% Epithelialization: Large (67-100%) Tunneling: No Undermining: No Wound Description Classification: Full Thickness Without Exposed Support Structu Wound Margin: Distinct, outline attached Exudate Amount: Medium Exudate Type: Serosanguineous Exudate Color: red, brown Wound Bed Granulation Amount: Large (67-100%) Granulation Quality: Red, Friable Necrotic Amount: None Present (0%) res Foul Odor After Cleansing: No Slough/Fibrino No Exposed Structure Fascia Exposed: No Fat Layer (Subcutaneous Tissue) Exposed: Yes Tendon Exposed: No Muscle Exposed: No Joint Exposed: No Bone Exposed: No Treatment Notes Wound #1 (Abdomen - midline) Cleanser Soap and Water Discharge Instruction: May shower and wash wound with dial antibacterial soap and water prior to dressing change. Wound  Cleanser Discharge Instruction: Cleanse the wound with wound cleanser prior to applying a clean dressing using gauze sponges, not tissue or cotton balls. Peri-Wound Care Topical Primary Dressing Hydrofera Blue Ready Foam, 4x5 in Discharge Instruction: Apply to wound bed as instructed Secondary Dressing ABD Pad, 5x9 Discharge Instruction: Apply over primary dressing as directed. Secured With 6M Medipore H Soft Cloth Surgical T 4 x 2 (in/yd) ape Discharge Instruction: Secure dressing with tape as directed. Compression Wrap Compression Stockings Add-Ons Electronic Signature(s) Signed: 01/05/2022 4:02:17 PM By: Thomas Pilling RN, BSN Entered By: Thomas Gonzalez on 01/05/2022 14:45:41 -------------------------------------------------------------------------------- Vitals Details Patient Name: Date of Service: [name]. 01/05/2022 2:30 PM Medical Record Number: [id number] Patient Account Number: [id number] Date of Birth/Sex: Treating RN: 08-30-1966 (56 y.o. Thomas Gonzalez, Thomas Gonzalez Primary Care Jillann Charette: Thomas Gonzalez Other Clinician: Referring Heru Montz: Treating Hennessey Cantrell/Extender: Thomas Gonzalez in Treatment: 28 Vital Signs Time Taken: 14:38 Temperature (F): 97.6 Height (in): 69 Pulse (bpm): 73 Weight (lbs): 191 Respiratory Rate (breaths/min): 20 Body Mass Index (BMI): 28.2 Blood Pressure (mmHg): 114/77 Reference Range: 80 - 120 mg / dl Electronic Signature(s) Signed: 01/05/2022 4:02:17 PM By: Thomas Pilling RN, BSN Entered By: Thomas Gonzalez on 01/05/2022 14:40:13

## 2022-01-16 ENCOUNTER — Ambulatory Visit: Payer: Self-pay | Admitting: Surgery

## 2022-01-18 ENCOUNTER — Encounter: Payer: Self-pay | Admitting: Oncology

## 2022-01-30 NOTE — Patient Instructions (Signed)
DUE TO COVID-19 ONLY ONE VISITOR  (aged 58 and older)  IS ALLOWED TO COME WITH YOU AND STAY IN THE WAITING ROOM ONLY DURING PRE OP AND PROCEDURE.   **NO VISITORS ARE ALLOWED IN THE SHORT STAY AREA OR RECOVERY ROOM!!**  IF YOU WILL BE ADMITTED INTO THE HOSPITAL YOU ARE ALLOWED ONLY TWO SUPPORT PEOPLE DURING VISITATION HOURS ONLY (7 AM -8PM)   The support person(s) must pass our screening, gel in and out, and wear a mask at all times, including in the patients room. Patients must also wear a mask when staff or their support person are in the room. Visitors GUEST BADGE MUST BE WORN VISIBLY  One adult visitor may remain with you overnight and MUST be in the room by 8 P.M.     COVID SWAB TESTING MUST BE COMPLETED ON:  02-09-22    (*ARRIVE AT YOUR APPOINTMENT TIME STAFF IS NOT HERE BEFORE 8AM!!!*)    Site: [facility] [address] [place] Enter: Main Entrance have a seat in the waiting area to the right of main entrance (DO NOT [place]!!!!!) Dial: [phone] to alert staff you have arrived  You are not required to quarantine, however you are required to wear a well-fitted mask when you are out and around people not in your household.  Hand Hygiene often Do NOT share personal items Notify your provider if you are in close contact with someone who has COVID or you develop fever 100.4 or greater, new onset of sneezing, cough, sore throat, shortness of breath or body aches.   Your procedure is scheduled on: 02-13-22   Report to [facility] Main Entrance    Report to admitting at       [place] AM   Call this number if you have problems the morning of surgery [phone]   Do not eat food :After Midnight.   After Midnight you may have the following liquids until _0830 _____ AM DAY OF SURGERY  Then nothing by mouth  Water Black Coffee (sugar ok, NO MILK/CREAM OR CREAMERS)  Tea (sugar ok, NO MILK/CREAM OR CREAMERS) regular and decaf                              Plain Jell-O (NO RED)                                           Fruit ices (not with fruit pulp, NO RED)                                     Popsicles (NO RED)                                                                  Juice: apple, WHITE grape, WHITE cranberry Sports drinks like Gatorade (NO RED) Clear broth(vegetable,chicken,beef)                 FOLLOW BOWEL PREP AND ANY ADDITIONAL PRE OP INSTRUCTIONS YOU RECEIVED FROM YOUR  SURGEON'S OFFICE!!!     Oral Hygiene is also important to reduce your risk of infection.                                     Remember - BRUSH YOUR TEETH THE MORNING OF SURGERY WITH YOUR REGULAR TOOTHPASTE   Do NOT smoke after Midnight   Take these medicines the morning of surgery with A SIP OF WATER: omeprazole, tylenol if needed and zofran if needed                                  You may not have any metal on your body including hair pins, jewelry, and body piercing             Do not wear make-up, lotions, powders, perfumes/cologne, or deodorant Do not shave  48 hours prior to surgery.               Men may shave face and neck.   Do not bring valuables to the hospital. [place].   Contacts, dentures or bridgework may not be worn into surgery.   Bring small overnight bag day of surgery.    Patients discharged on the day of surgery will not be allowed to drive home.  Someone NEEDS to stay with you for the first 24 hours after anesthesia.   Special Instructions: Bring a copy of your healthcare power of attorney and living will documents         the day of surgery if you haven't scanned them before.              Please read over the following fact sheets you were given: IF YOU HAVE QUESTIONS ABOUT YOUR PRE-OP INSTRUCTIONS PLEASE CALL [phone]     [facility] - Preparing for Surgery Before surgery, you can play an important role.  Because skin is not sterile, your skin needs to  be as free of germs as possible.  You can reduce the number of germs on your skin by washing with CHG (chlorahexidine gluconate) soap before surgery.  CHG is an antiseptic cleaner which kills germs and bonds with the skin to continue killing germs even after washing. Please DO NOT use if you have an allergy to CHG or antibacterial soaps.  If your skin becomes reddened/irritated stop using the CHG and inform your nurse when you arrive at Short Stay. Do not shave (including legs and underarms) for at least 48 hours prior to the first CHG shower.  You may shave your face/neck. Please follow these instructions carefully:  1.  Shower with CHG Soap the night before surgery and the  morning of Surgery.  2.  If you choose to wash your hair, wash your hair first as usual with your  normal  shampoo.  3.  After you shampoo, rinse your hair and body thoroughly to remove the  shampoo.                           4.  Use CHG as you would any other liquid soap.  You can apply chg directly  to the skin and wash  Gently with a scrungie or clean washcloth.  5.  Apply the CHG Soap to your body ONLY FROM THE NECK DOWN.   Do not use on face/ open                           Wound or open sores. Avoid contact with eyes, ears mouth and genitals (private parts).                       Wash face,  Genitals (private parts) with your normal soap.             6.  Wash thoroughly, paying special attention to the area where your surgery  will be performed.  7.  Thoroughly rinse your body with warm water from the neck down.  8.  DO NOT shower/wash with your normal soap after using and rinsing off  the CHG Soap.                9.  Pat yourself dry with a clean towel.            10.  Wear clean pajamas.            11.  Place clean sheets on your bed the night of your first shower and do not  sleep with pets. Day of Surgery : Do not apply any lotions/deodorants the morning of surgery.  Please wear clean clothes to  the hospital/surgery center.  FAILURE TO FOLLOW THESE INSTRUCTIONS MAY RESULT IN THE CANCELLATION OF YOUR SURGERY PATIENT SIGNATURE_________________________________  NURSE SIGNATURE__________________________________  ________________________________________________________________________

## 2022-01-30 NOTE — Progress Notes (Signed)
PCP -  Cardiologist -   PPM/ICD -  Device Orders -  Rep Notified -   Chest x-ray -  EKG -  Stress Test -  ECHO -  Cardiac Cath -   Sleep Study -  CPAP -   Fasting Blood Sugar -  Checks Blood Sugar _____ times a day  Blood Thinner Instructions: Aspirin Instructions:  ERAS Protcol - PRE-SURGERY   COVID TEST- 02-09-22 COVID vaccine -  Activity-- Anesthesia review:   Patient denies shortness of breath, fever, cough and chest pain at PAT appointment   All instructions explained to the patient, with a verbal understanding of the material. Patient agrees to go over the instructions while at home for a better understanding. Patient also instructed to self quarantine after being tested for COVID-19. The opportunity to ask questions was provided.

## 2022-02-01 ENCOUNTER — Other Ambulatory Visit (HOSPITAL_COMMUNITY): Payer: Self-pay

## 2022-02-01 ENCOUNTER — Encounter: Payer: Self-pay | Admitting: Oncology

## 2022-02-02 ENCOUNTER — Other Ambulatory Visit: Payer: Self-pay

## 2022-02-02 ENCOUNTER — Encounter (HOSPITAL_BASED_OUTPATIENT_CLINIC_OR_DEPARTMENT_OTHER): Payer: Managed Care, Other (non HMO) | Attending: Internal Medicine | Admitting: Internal Medicine

## 2022-02-02 ENCOUNTER — Encounter (HOSPITAL_COMMUNITY)
Admission: RE | Admit: 2022-02-02 | Discharge: 2022-02-02 | Disposition: A | Discharge status: Home / Self Care | Payer: Managed Care, Other (non HMO) | Source: Ambulatory Visit | Attending: Surgery | Admitting: Surgery

## 2022-02-02 ENCOUNTER — Encounter (HOSPITAL_COMMUNITY): Payer: Self-pay

## 2022-02-02 DIAGNOSIS — Z01812 Encounter for preprocedural laboratory examination: Secondary | ICD-10-CM | POA: Diagnosis not present

## 2022-02-02 DIAGNOSIS — L98492 Non-pressure chronic ulcer of skin of other sites with fat layer exposed: Secondary | ICD-10-CM | POA: Diagnosis not present

## 2022-02-02 DIAGNOSIS — Z8501 Personal history of malignant neoplasm of esophagus: Secondary | ICD-10-CM | POA: Insufficient documentation

## 2022-02-02 DIAGNOSIS — K439 Ventral hernia without obstruction or gangrene: Secondary | ICD-10-CM | POA: Diagnosis not present

## 2022-02-02 DIAGNOSIS — T8131XA Disruption of external operation (surgical) wound, not elsewhere classified, initial encounter: Secondary | ICD-10-CM | POA: Diagnosis present

## 2022-02-02 DIAGNOSIS — Z01818 Encounter for other preprocedural examination: Secondary | ICD-10-CM

## 2022-02-02 DIAGNOSIS — Y838 Other surgical procedures as the cause of abnormal reaction of the patient, or of later complication, without mention of misadventure at the time of the procedure: Secondary | ICD-10-CM | POA: Insufficient documentation

## 2022-02-02 DIAGNOSIS — Z09 Encounter for follow-up examination after completed treatment for conditions other than malignant neoplasm: Secondary | ICD-10-CM | POA: Diagnosis not present

## 2022-02-02 LAB — CBC
HCT: 48.2 % (ref 39.0–52.0)
Hemoglobin: 16.3 g/dL (ref 13.0–17.0)
MCH: 32.1 pg (ref 26.0–34.0)
MCHC: 33.8 g/dL (ref 30.0–36.0)
MCV: 95.1 fL (ref 80.0–100.0)
Platelets: 214 K/uL (ref 150–400)
RBC: 5.07 MIL/uL (ref 4.22–5.81)
RDW: 13 % (ref 11.5–15.5)
WBC: 6.8 K/uL (ref 4.0–10.5)
nRBC: 0 % (ref 0.0–0.2)

## 2022-02-02 NOTE — Progress Notes (Addendum)
Thomas Gonzalez ([id number]) ?Visit Report for 02/02/2022 ?Chief Complaint Document Details ?Patient Name: Date of Service: ?Thomas Gonzalez 02/02/2022 2:15 PM ?Medical Record Number: [id number] ?Patient Account Number: [id number] ?Date of Birth/Sex: Treating RN: ?[birth year] (56 y.o. M) ?Primary Care Provider: [name] Other Clinician: ?Referring Provider: ?Treating Provider/Extender: [name] ?[name] ?Weeks in Treatment: 32 ?Information Obtained from: Patient ?Chief Complaint ?Surgical Ulcer ?Electronic Signature(s) ?Signed: 02/02/2022 5:03:17 PM By: [name] DO ?Entered By: [name] on 02/02/2022 16:58:35 ?-------------------------------------------------------------------------------- ?HPI Details ?Patient Name: Date of Service: ?Thomas Gonzalez 02/02/2022 2:15 PM ?Medical Record Number: [id number] ?Patient Account Number: [id number] ?Date of Birth/Sex: Treating RN: ?August 19, 1966 (56 y.o. M) ?Primary Care Provider: [name] Other Clinician: ?Referring Provider: ?Treating Provider/Extender: [name] ?[name] ?Weeks in Treatment: 32 ?History of Present Illness ?HPI Description: 06/22/2021 patient presents today as a pleasant gentleman who unfortunately has been having quite a bit of an issue with his abdominal area ?following surgery in 2021 November. 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 Subsequently he also was on ?immunotherapy following to try to help with any residual carcinoma following the procedure. Nonetheless he had an issue with the incision site in the mid ?abdomen and unfortunately this ended up dehiscing and has had trouble since getting this healed. He has been seen by Dr. [name] him where [facility]Cell was utilized ?but again there is really no response she referred the patient to [place] for further evaluation and treatment. No chemical cauterization with silver nitrate has been ?utilized. He also has not had any  he tells me aggressive sharp debridement of the area to clear away some of this hypergranular tissue which he has ?significant amounts noted. Either way I think that we will get a need to try to see about removing this I do think, to send sample of this to biopsy just to make ?sure there is no signs of cancer noted although I really think that is probably not can be the case I think we will get a get a report back stating hypergranulation ?tissue. With that being said the patient does seem to have quite a bit of bleeding and friability even with wiping over the wound area and again I think this is a ?big part of the issue again he can even grow new skin because anything attaching is attaching to the hypergranulation which is not even get a be anywhere ?close to sufficient for good epithelial growth. He also has had a CT scan in April which showed there was post esophagectomy with gastric pull-up anatomy but ?no evidence of local recurrence. There is no evidence of mediastinal nodal metastasis. He did have small bilateral pulmonary nodules which were unchanged ?recommend routine surveillance. Otherwise the patient has a ventral hernia following the surgery but no evidence of obstruction or gangrene. ?06/28/2021 upon evaluation today patient's wound actually appears to be doing quite a bit better and it is hypergranular tissue is significantly improved compared ?to last time. With that being said they had a hard time getting the [facility] I am not sure exactly what all happened but the good news is that is ?something that should be on the way and he should have that if not today then tomorrow. Overall I think that with the [facility] this would have looked ?even better but nonetheless I am still pleased with what I am seeing today. ?07/06/2021 upon evaluation today patient's wound is actually showing signs of improvement and  in general I feel like he is making excellent progress. The ?Hydrofera Blue dressing to  be doing excellent for him it does cause a little bit of bleeding when he removes it during dressing changes but other than that he is ?doing quite well based on what I am seeing. Fortunately there does not appear to be any signs of active infection at this time. No fevers, chills, nausea, ?vomiting, or diarrhea. ?07/13/2021 upon evaluation today patient appears to be doing well with regard to his abdominal ulcer. Overall I am extremely pleased with where things stand I ?think he is making good progress. I do think the Hydrofera Blue is doing a great job as far as treating this is concerned. I do believe that as long as he is ?seeing improvement we should continue as such. We may be able to contemplate some other options such as skin substitutes but right now when see how we ?do with this. ?08/03/2021 upon evaluation today patient appears to be doing well with regard to his abdominal ulcer. Week by week this is showing signs of improvement which ?is good news. Overall the surface of the wound is also greatly improved. I am extremely pleased with where we stand today. ?08/24/2021 upon evaluation today patient's wound on the abdominal area appears to be doing excellent today. Fortunately there is no signs of active infection ?and very pleased in that regard. There does not appear to be any signs of active infection systemically either which is also good news. No fevers, chills, ?nausea, vomiting, or diarrhea. Upon inspection patient's wound bed showed signs of good granulation epithelization at this point. Fortunately there does not ?appear to be any signs of active infection at this time which is great news. ?10/27; patient presents for 3-week follow-up. He has been using Hydrofera Blue to the wound bed without issues. He has no complaints today. He denies signs ?of infection. ?11/17; patient presents for 3-week follow-up. He has been doing well with Hydrofera Blue dressing changes. He has no issues or complaints today.  He denies ?signs of infection. ?12/8; patient presents for 3-week follow-up. He has been doing well with [facility] and has no issues or complaints today. ?12/29; patient presents for 3-week follow-up. He continues to use [facility] without any issues. He has no concerns today. ?1/19; patient presents for follow-up. He continues to use [facility] without issues. ?2/16; patient presents for follow-up. He continues to use [facility] without issues. He states he is scheduled to have hernia repair surgery on 3/27. He ?has no issues or complaints today. ?3/16; patient presents for follow-up. He has been using Hydrofera Blue without issues. He is scheduled for his open incisional hernia repair on 3/27. He has no ?issues or complaints today. ?Electronic Signature(s) ?Signed: 02/02/2022 5:03:17 PM By: [name] DO ?Entered By: [name] on 02/02/2022 16:59:14 ?-------------------------------------------------------------------------------- ?Physical Exam Details ?Patient Name: Date of Service: ?Thomas Gonzalez 02/02/2022 2:15 PM ?Medical Record Number: [id number] ?Patient Account Number: [id number] ?Date of Birth/Sex: Treating RN: ?01/31/1966 (56 y.o. M) ?Primary Care Provider: [name] Other Clinician: ?Referring Provider: ?Treating Provider/Extender: [name] ?[name] ?Weeks in Treatment: 32 ?Constitutional ?respirations regular, non-labored and within target range for patient.Marland Kitchen ?Psychiatric ?pleasant and cooperative. ?Notes ?Abdomen: Epithelization to previous wound site. ?Electronic Signature(s) ?Signed: 02/02/2022 5:03:17 PM By: [name] DO ?Entered By: [name] on 02/02/2022 16:59:44 ?-------------------------------------------------------------------------------- ?Physician Orders Details ?Patient Name: Date of Service: ?Thomas Gonzalez. 02/02/2022 2:15 PM ?Medical Record Number:  [id number] ?Patient Account Number: [id number] ?Date of Birth/Sex:  Treating RN: ?[birth year] (56 y.o. M) Rolin Barry, Bobbi ?Primary Care Provider: [name] Other Clinician: ?Referring Provider: ?Treating Provider/Extender: [name] ?[name] ?Weeks in T

## 2022-02-02 NOTE — Progress Notes (Signed)
GORDEN, STTHOMAS ([id number]) ?Visit Report for 02/02/2022 ?Arrival Information Details ?Patient Name: Date of Service: ?Thomas Gonzalez 02/02/2022 2:15 PM ?Medical Record Number: [id number] ?Patient Account Number: [id number] ?Date of Birth/Sex: Treating RN: ?October 14, 1966 (56 y.o. Mare Ferrari ?Primary Care Anum Palecek: [name] Other Clinician: ?Referring Adair Lemar: ?Treating Aaran Enberg/Extender: [name] ?[name] ?Weeks in Treatment: 32 ?Visit Information History Since Last Visit ?Added or deleted any medications: No ?Patient Arrived: Ambulatory ?Any new allergies or adverse reactions: No ?Arrival Time: 14:42 ?Had a fall or experienced change in No ?Accompanied By: wife ?activities of daily living that may affect ?Transfer Assistance: None ?risk of falls: ?Patient Identification Verified: Yes ?Signs or symptoms of abuse/neglect since last visito No ?Secondary Verification Process Completed: Yes ?Hospitalized since last visit: No ?Patient Requires Transmission-Based Precautions: No ?Implantable device outside of the clinic excluding No ?Patient Has Alerts: No ?cellular tissue based products placed in the center ?since last visit: ?Has Dressing in Place as Prescribed: Yes ?Pain Present Now: No ?Electronic Signature(s) ?Signed: 02/02/2022 5:06:01 PM By: Sharyn Creamer RN, BSN ?Entered By: Sharyn Creamer on 02/02/2022 14:46:00 ?-------------------------------------------------------------------------------- ?Clinic Level of Care Assessment Details ?Patient Name: Date of Service: ?Thomas Gonzalez 02/02/2022 2:15 PM ?Medical Record Number: [id number] ?Patient Account Number: [id number] ?Date of Birth/Sex: Treating RN: ?06-Nov-1966 (56 y.o. M) Rolin Barry, Bobbi ?Primary Care Vaunda Gutterman: [name] Other Clinician: ?Referring Cylinda Santoli: ?Treating Nabeel Gladson/Extender: [name] ?[name] ?Weeks in Treatment: 32 ?Clinic Level of Care Assessment Items ?TOOL 4 Quantity Score ?X- 1 0 ?Use when  only an EandM is performed on FOLLOW-UP visit ?ASSESSMENTS - Nursing Assessment / Reassessment ?X- 1 10 ?Reassessment of Co-morbidities (includes updates in patient status) ?X- 1 5 ?Reassessment of Adherence to Treatment Plan ?ASSESSMENTS - Wound and Skin A ssessment / Reassessment ?X - Simple Wound Assessment / Reassessment - one wound 1 5 ?'[]'$  - 0 ?Complex Wound Assessment / Reassessment - multiple wounds ?X- 1 10 ?Dermatologic / Skin Assessment (not related to wound area) ?ASSESSMENTS - Focused Assessment ?'[]'$  - 0 ?Circumferential Edema Measurements - multi extremities ?'[]'$  - 0 ?Nutritional Assessment / Counseling / Intervention ?'[]'$  - 0 ?Lower Extremity Assessment (monofilament, tuning fork, pulses) ?'[]'$  - 0 ?Peripheral Arterial Disease Assessment (using hand held doppler) ?ASSESSMENTS - Ostomy and/or Continence Assessment and Care ?'[]'$  - 0 ?Incontinence Assessment and Management ?'[]'$  - 0 ?Ostomy Care Assessment and Management (repouching, etc.) ?PROCESS - Coordination of Care ?X - Simple Patient / Family Education for ongoing care 1 15 ?'[]'$  - 0 ?Complex (extensive) Patient / Family Education for ongoing care ?X- 1 10 ?Staff obtains Consents, Records, T Results / Process Orders ?est ?'[]'$  - 0 ?Staff telephones HHA, Nursing Homes / Clarify orders / etc ?'[]'$  - 0 ?Routine Transfer to another Facility (non-emergent condition) ?'[]'$  - 0 ?Routine Hospital Admission (non-emergent condition) ?'[]'$  - 0 ?New Admissions / Biomedical engineer / Ordering NPWT Apligraf, etc. ?, ?'[]'$  - 0 ?Emergency Hospital Admission (emergent condition) ?X- 1 10 ?Simple Discharge Coordination ?'[]'$  - 0 ?Complex (extensive) Discharge Coordination ?PROCESS - Special Needs ?'[]'$  - 0 ?Pediatric / Minor Patient Management ?'[]'$  - 0 ?Isolation Patient Management ?'[]'$  - 0 ?Hearing / Language / Visual special needs ?'[]'$  - 0 ?Assessment of Community assistance (transportation, D/C planning, etc.) ?'[]'$  - 0 ?Additional assistance / Altered mentation ?'[]'$  - 0 ?Support  Surface(s) Assessment (bed, cushion, seat, etc.) ?INTERVENTIONS - Wound Cleansing / Measurement ?X - Simple Wound Cleansing - one wound 1 5 ?'[]'$  - 0 ?Complex Wound Cleansing -  multiple wounds ?X- 1 5 ?Wound Imaging (photographs - any number of wounds) ?'[]'$  - 0 ?Wound Tracing (instead of photographs) ?X- 1 5 ?Simple Wound Measurement - one wound ?'[]'$  - 0 ?Complex Wound Measurement - multiple wounds ?INTERVENTIONS - Wound Dressings ?X - Small Wound Dressing one or multiple wounds 1 10 ?'[]'$  - 0 ?Medium Wound Dressing one or multiple wounds ?'[]'$  - 0 ?Large Wound Dressing one or multiple wounds ?'[]'$  - 0 ?Application of Medications - topical ?'[]'$  - 0 ?Application of Medications - injection ?INTERVENTIONS - Miscellaneous ?'[]'$  - 0 ?External ear exam ?'[]'$  - 0 ?Specimen Collection (cultures, biopsies, blood, body fluids, etc.) ?'[]'$  - 0 ?Specimen(s) / Culture(s) sent or taken to Lab for analysis ?'[]'$  - 0 ?Patient Transfer (multiple staff / Civil Service fast streamer / Similar devices) ?'[]'$  - 0 ?Simple Staple / Suture removal (25 or less) ?'[]'$  - 0 ?Complex Staple / Suture removal (26 or more) ?'[]'$  - 0 ?Hypo / Hyperglycemic Management (close monitor of Blood Glucose) ?'[]'$  - 0 ?Ankle / Brachial Index (ABI) - do not check if billed separately ?X- 1 5 ?Vital Signs ?Has the patient been seen at the hospital within the last three years: Yes ?Total Score: 95 ?Level Of Care: New/Established - Level 3 ?Electronic Signature(s) ?Signed: 02/02/2022 6:07:05 PM By: Deon Pilling RN, BSN ?Entered By: Deon Pilling on 02/02/2022 17:10:41 ?-------------------------------------------------------------------------------- ?Encounter Discharge Information Details ?Patient Name: Date of Service: ?Thomas Gonzalez 02/02/2022 2:15 PM ?Medical Record Number: [id number] ?Patient Account Number: [id number] ?Date of Birth/Sex: Treating RN: ?10/05/1966 (56 y.o. M) Rolin Barry, Bobbi ?Primary Care Sharran Caratachea: [name] Other Clinician: ?Referring Aleksandar Duve: ?Treating Ceola Para/Extender:  [name] ?[name] ?Weeks in Treatment: 32 ?Encounter Discharge Information Items ?Discharge Condition: Stable ?Ambulatory Status: Ambulatory ?Discharge Destination: Home ?Transportation: Private Auto ?Accompanied By: wife ?Schedule Follow-up Appointment: No ?Clinical Summary of Care: ?Electronic Signature(s) ?Signed: 02/02/2022 6:07:05 PM By: Deon Pilling RN, BSN ?Entered By: Deon Pilling on 02/02/2022 17:11:09 ?-------------------------------------------------------------------------------- ?Lower Extremity Assessment Details ?Patient Name: Date of Service: ?Thomas Gonzalez 02/02/2022 2:15 PM ?Medical Record Number: [id number] ?Patient Account Number: [id number] ?Date of Birth/Sex: Treating RN: ?[birth year] (56 y.o. Mare Ferrari ?Primary Care Konor Noren: [name] Other Clinician: ?Referring Kristie Bracewell: ?Treating Icyss Skog/Extender: [name] ?[name] ?Weeks in Treatment: 32 ?Electronic Signature(s) ?Signed: 02/02/2022 5:06:01 PM By: Sharyn Creamer RN, BSN ?Entered By: Sharyn Creamer on 02/02/2022 14:46:47 ?-------------------------------------------------------------------------------- ?Multi Wound Chart Details ?Patient Name: Date of Service: ?Thomas Gonzalez 02/02/2022 2:15 PM ?Medical Record Number: [id number] ?Patient Account Number: [id number] ?Date of Birth/Sex: ?Treating RN: ?04-30-1966 (56 y.o. M) ?Primary Care Lander Eslick: [name] ?Other Clinician: ?Referring Keimya Briddell: ?Treating Ileen Kahre/Extender: [name] ?[name] ?Weeks in Treatment: 32 ?Vital Signs ?Height(in): 69 ?Pulse(bpm): 76 ?Weight(lbs): 191 ?Blood Pressure(mmHg): 132/84 ?Body Mass Index(BMI): 28.2 ?Temperature(??F): 97.9 ?Respiratory Rate(breaths/min): 18 ?Photos: [N/A:N/A] ?Abdomen - midline N/A N/A ?Wound Location: ?Surgical Injury N/A N/A ?Wounding Event: ?Open Surgical Wound N/A N/A ?Primary Etiology: ?Received Chemotherapy, Received N/A N/A ?Comorbid History: ?Radiation ?09/30/2020  N/A N/A ?Date Acquired: ?21 N/A N/A ?Weeks of Treatment: ?Healed - Epithelialized N/A N/A ?Wound Status: ?No N/A N/A ?Wound Recurrence: ?0x0x0 N/A N/A ?Measurements L x W x D (cm) ?0 N/A N/A ?A (cm?) : ?rea ?0 N/

## 2022-02-13 ENCOUNTER — Other Ambulatory Visit: Payer: Self-pay

## 2022-02-13 ENCOUNTER — Inpatient Hospital Stay (HOSPITAL_COMMUNITY)
Admission: RE | Admit: 2022-02-13 | Discharge: 2022-02-14 | DRG: 355 | Disposition: A | Discharge status: Home / Self Care | Payer: Commercial Managed Care - HMO | Attending: Surgery | Admitting: Surgery

## 2022-02-13 ENCOUNTER — Encounter (HOSPITAL_COMMUNITY): Payer: Self-pay | Admitting: Surgery

## 2022-02-13 ENCOUNTER — Inpatient Hospital Stay (HOSPITAL_COMMUNITY): Payer: Commercial Managed Care - HMO | Admitting: Physician Assistant

## 2022-02-13 ENCOUNTER — Encounter (HOSPITAL_COMMUNITY)
Admission: RE | Disposition: A | Discharge status: Home / Self Care | Payer: Self-pay | Source: Home / Self Care | Attending: Surgery

## 2022-02-13 ENCOUNTER — Inpatient Hospital Stay (HOSPITAL_COMMUNITY): Payer: Commercial Managed Care - HMO | Admitting: Anesthesiology

## 2022-02-13 DIAGNOSIS — K439 Ventral hernia without obstruction or gangrene: Principal | ICD-10-CM | POA: Diagnosis present

## 2022-02-13 DIAGNOSIS — Z923 Personal history of irradiation: Secondary | ICD-10-CM | POA: Diagnosis not present

## 2022-02-13 DIAGNOSIS — Z880 Allergy status to penicillin: Secondary | ICD-10-CM

## 2022-02-13 DIAGNOSIS — Z9049 Acquired absence of other specified parts of digestive tract: Secondary | ICD-10-CM

## 2022-02-13 DIAGNOSIS — K432 Incisional hernia without obstruction or gangrene: Secondary | ICD-10-CM | POA: Diagnosis present

## 2022-02-13 DIAGNOSIS — Z9221 Personal history of antineoplastic chemotherapy: Secondary | ICD-10-CM | POA: Diagnosis not present

## 2022-02-13 DIAGNOSIS — Z888 Allergy status to other drugs, medicaments and biological substances status: Secondary | ICD-10-CM

## 2022-02-13 DIAGNOSIS — Z87891 Personal history of nicotine dependence: Secondary | ICD-10-CM | POA: Diagnosis not present

## 2022-02-13 DIAGNOSIS — Z8501 Personal history of malignant neoplasm of esophagus: Secondary | ICD-10-CM

## 2022-02-13 HISTORY — PX: INCISIONAL HERNIA REPAIR: SHX193

## 2022-02-13 LAB — CBC
HCT: 45.8 % (ref 39.0–52.0)
Hemoglobin: 15.8 g/dL (ref 13.0–17.0)
MCH: 32.6 pg (ref 26.0–34.0)
MCHC: 34.5 g/dL (ref 30.0–36.0)
MCV: 94.6 fL (ref 80.0–100.0)
Platelets: 228 10*3/uL (ref 150–400)
RBC: 4.84 MIL/uL (ref 4.22–5.81)
RDW: 12.9 % (ref 11.5–15.5)
WBC: 15.2 10*3/uL — ABNORMAL HIGH (ref 4.0–10.5)
nRBC: 0 % (ref 0.0–0.2)

## 2022-02-13 LAB — CREATININE, SERUM
Creatinine, Ser: 0.91 mg/dL (ref 0.61–1.24)
GFR, Estimated: >60 mL/min (ref >60)

## 2022-02-13 SURGERY — REPAIR, HERNIA, INCISIONAL
Anesthesia: General | Site: Abdomen

## 2022-02-13 MED ORDER — MIDAZOLAM HCL 5 MG/5ML IJ SOLN
INTRAMUSCULAR | Status: DC | PRN
  Administered 2022-02-13: 2 mg via INTRAVENOUS

## 2022-02-13 MED ORDER — LIDOCAINE HCL (CARDIAC) PF 100 MG/5ML IV SOSY
PREFILLED_SYRINGE | INTRAVENOUS | Status: DC | PRN
  Administered 2022-02-13: 100 mg via INTRAVENOUS

## 2022-02-13 MED ORDER — DEXAMETHASONE SODIUM PHOSPHATE 10 MG/ML IJ SOLN
INTRAMUSCULAR | Status: DC | PRN
  Administered 2022-02-13: 5 mg via INTRAVENOUS

## 2022-02-13 MED ORDER — DEXAMETHASONE SODIUM PHOSPHATE 10 MG/ML IJ SOLN
INTRAMUSCULAR | Status: AC
  Filled 2022-02-13: qty 1

## 2022-02-13 MED ORDER — ONDANSETRON HCL 4 MG/2ML IJ SOLN
INTRAMUSCULAR | Status: AC
  Filled 2022-02-13: qty 2

## 2022-02-13 MED ORDER — ONDANSETRON HCL 4 MG/2ML IJ SOLN
4.0000 mg | Freq: Four times a day (QID) | INTRAMUSCULAR | Status: DC | PRN
Start: 2022-02-13 — End: 2022-02-14
  Administered 2022-02-13: 4 mg via INTRAVENOUS
  Filled 2022-02-13: qty 2

## 2022-02-13 MED ORDER — BUPIVACAINE-EPINEPHRINE 0.25% -1:200000 IJ SOLN
INTRAMUSCULAR | Status: DC | PRN
  Administered 2022-02-13: 30 mL

## 2022-02-13 MED ORDER — ACETAMINOPHEN 500 MG PO TABS
1000.0000 mg | ORAL_TABLET | ORAL | Status: AC
  Administered 2022-02-13: 1000 mg via ORAL
  Filled 2022-02-13: qty 2

## 2022-02-13 MED ORDER — LIDOCAINE HCL (PF) 2 % IJ SOLN
INTRAMUSCULAR | Status: AC
  Filled 2022-02-13: qty 5

## 2022-02-13 MED ORDER — KETOROLAC TROMETHAMINE 15 MG/ML IJ SOLN
15.0000 mg | Freq: Three times a day (TID) | INTRAMUSCULAR | Status: DC
  Administered 2022-02-13 – 2022-02-14 (×2): 15 mg via INTRAVENOUS
  Filled 2022-02-13 (×2): qty 1

## 2022-02-13 MED ORDER — HYDROMORPHONE HCL 1 MG/ML IJ SOLN: 1.0000 mg | INTRAMUSCULAR | Status: DC | PRN

## 2022-02-13 MED ORDER — BUPIVACAINE LIPOSOME 1.3 % IJ SUSP
INTRAMUSCULAR | Status: AC
  Filled 2022-02-13: qty 20

## 2022-02-13 MED ORDER — SIMETHICONE 80 MG PO CHEW
80.0000 mg | CHEWABLE_TABLET | Freq: Four times a day (QID) | ORAL | Status: DC | PRN
Start: 2022-02-13 — End: 2022-02-14

## 2022-02-13 MED ORDER — OXYCODONE HCL 5 MG PO TABS: 5.0000 mg | ORAL_TABLET | Freq: Once | ORAL | Status: DC | PRN

## 2022-02-13 MED ORDER — METHOCARBAMOL 500 MG IVPB - SIMPLE MED
500.0000 mg | Freq: Four times a day (QID) | INTRAVENOUS | Status: DC | PRN
Start: 2022-02-13 — End: 2022-02-14
  Administered 2022-02-13: 500 mg via INTRAVENOUS
  Filled 2022-02-13: qty 50
  Filled 2022-02-13: qty 500

## 2022-02-13 MED ORDER — LACTATED RINGERS IV SOLN: INTRAVENOUS | Status: DC

## 2022-02-13 MED ORDER — SUGAMMADEX SODIUM 200 MG/2ML IV SOLN
INTRAVENOUS | Status: DC | PRN
  Administered 2022-02-13: 200 mg via INTRAVENOUS

## 2022-02-13 MED ORDER — PHENOL 1.4 % MT LIQD: 1.0000 | OROMUCOSAL | Status: DC | PRN

## 2022-02-13 MED ORDER — FENTANYL CITRATE (PF) 100 MCG/2ML IJ SOLN
INTRAMUSCULAR | Status: AC
Start: 2022-02-13 — End: ?
  Filled 2022-02-13: qty 2

## 2022-02-13 MED ORDER — CHLORHEXIDINE GLUCONATE CLOTH 2 % EX PADS: 6.0000 | MEDICATED_PAD | Freq: Once | CUTANEOUS | Status: DC

## 2022-02-13 MED ORDER — GABAPENTIN 600 MG PO TABS
300.0000 mg | ORAL_TABLET | Freq: Three times a day (TID) | ORAL | Status: DC
  Filled 2022-02-13 (×2): qty 0.5

## 2022-02-13 MED ORDER — ENOXAPARIN SODIUM 40 MG/0.4ML IJ SOSY
40.0000 mg | PREFILLED_SYRINGE | Freq: Once | INTRAMUSCULAR | Status: AC
  Administered 2022-02-13: 40 mg via SUBCUTANEOUS
  Filled 2022-02-13: qty 0.4

## 2022-02-13 MED ORDER — ONDANSETRON HCL 4 MG/2ML IJ SOLN
INTRAMUSCULAR | Status: DC | PRN
Start: 2022-02-13 — End: 2022-02-13
  Administered 2022-02-13: 4 mg via INTRAVENOUS

## 2022-02-13 MED ORDER — OXYCODONE HCL 5 MG PO TABS: 10.0000 mg | ORAL_TABLET | ORAL | Status: DC | PRN

## 2022-02-13 MED ORDER — ROCURONIUM BROMIDE 10 MG/ML (PF) SYRINGE
PREFILLED_SYRINGE | INTRAVENOUS | Status: AC
  Filled 2022-02-13: qty 10

## 2022-02-13 MED ORDER — ROCURONIUM BROMIDE 10 MG/ML (PF) SYRINGE
PREFILLED_SYRINGE | INTRAVENOUS | Status: DC | PRN
  Administered 2022-02-13: 80 mg via INTRAVENOUS
  Administered 2022-02-13 (×2): 20 mg via INTRAVENOUS
  Administered 2022-02-13: 10 mg via INTRAVENOUS
  Administered 2022-02-13: 20 mg via INTRAVENOUS
  Administered 2022-02-13: 10 mg via INTRAVENOUS

## 2022-02-13 MED ORDER — HYDROMORPHONE HCL 1 MG/ML IJ SOLN
0.2500 mg | INTRAMUSCULAR | Status: DC | PRN
  Administered 2022-02-13 (×2): 0.5 mg via INTRAVENOUS

## 2022-02-13 MED ORDER — DOCUSATE SODIUM 100 MG PO CAPS
100.0000 mg | ORAL_CAPSULE | Freq: Two times a day (BID) | ORAL | Status: DC
  Administered 2022-02-13 – 2022-02-14 (×2): 100 mg via ORAL
  Filled 2022-02-13 (×2): qty 1

## 2022-02-13 MED ORDER — ACETAMINOPHEN 325 MG PO TABS
650.0000 mg | ORAL_TABLET | Freq: Four times a day (QID) | ORAL | Status: DC
  Administered 2022-02-13 – 2022-02-14 (×3): 650 mg via ORAL
  Filled 2022-02-13 (×3): qty 2

## 2022-02-13 MED ORDER — BUPIVACAINE LIPOSOME 1.3 % IJ SUSP: 20.0000 mL | Freq: Once | INTRAMUSCULAR | Status: DC

## 2022-02-13 MED ORDER — ONDANSETRON HCL 4 MG/2ML IJ SOLN: INTRAMUSCULAR | Status: DC | PRN

## 2022-02-13 MED ORDER — ONDANSETRON HCL 4 MG/2ML IJ SOLN: 4.0000 mg | Freq: Once | INTRAMUSCULAR | Status: DC | PRN

## 2022-02-13 MED ORDER — PROPOFOL 10 MG/ML IV BOLUS
INTRAVENOUS | Status: DC | PRN
  Administered 2022-02-13: 200 mg via INTRAVENOUS

## 2022-02-13 MED ORDER — DROPERIDOL 2.5 MG/ML IJ SOLN: 0.6250 mg | Freq: Once | INTRAMUSCULAR | Status: DC | PRN

## 2022-02-13 MED ORDER — CHLORHEXIDINE GLUCONATE 0.12 % MT SOLN
15.0000 mL | Freq: Once | OROMUCOSAL | Status: AC
  Administered 2022-02-13: 15 mL via OROMUCOSAL

## 2022-02-13 MED ORDER — ENOXAPARIN SODIUM 40 MG/0.4ML IJ SOSY
40.0000 mg | PREFILLED_SYRINGE | INTRAMUSCULAR | Status: DC
  Administered 2022-02-14: 40 mg via SUBCUTANEOUS

## 2022-02-13 MED ORDER — CLINDAMYCIN PHOSPHATE 900 MG/50ML IV SOLN
900.0000 mg | INTRAVENOUS | Status: AC
  Administered 2022-02-13: 900 mg via INTRAVENOUS
  Filled 2022-02-13: qty 50

## 2022-02-13 MED ORDER — 0.9 % SODIUM CHLORIDE (POUR BTL) OPTIME
TOPICAL | Status: DC | PRN
  Administered 2022-02-13: 2000 mL

## 2022-02-13 MED ORDER — ORAL CARE MOUTH RINSE: 15.0000 mL | Freq: Once | OROMUCOSAL | Status: AC

## 2022-02-13 MED ORDER — BUPIVACAINE LIPOSOME 1.3 % IJ SUSP
INTRAMUSCULAR | Status: DC | PRN
  Administered 2022-02-13: 20 mL

## 2022-02-13 MED ORDER — FENTANYL CITRATE (PF) 100 MCG/2ML IJ SOLN
INTRAMUSCULAR | Status: AC
  Filled 2022-02-13: qty 2

## 2022-02-13 MED ORDER — GABAPENTIN 300 MG PO CAPS
300.0000 mg | ORAL_CAPSULE | Freq: Three times a day (TID) | ORAL | Status: DC
  Administered 2022-02-13 – 2022-02-14 (×2): 300 mg via ORAL
  Filled 2022-02-13 (×2): qty 1

## 2022-02-13 MED ORDER — OXYCODONE HCL 5 MG PO TABS
5.0000 mg | ORAL_TABLET | ORAL | Status: DC | PRN
  Administered 2022-02-13: 5 mg via ORAL
  Filled 2022-02-13: qty 1

## 2022-02-13 MED ORDER — OXYCODONE-ACETAMINOPHEN 5-325 MG PO TABS: 1.0000 | ORAL_TABLET | ORAL | 0 refills | Status: AC | PRN

## 2022-02-13 MED ORDER — HYDROMORPHONE HCL 1 MG/ML IJ SOLN
INTRAMUSCULAR | Status: AC
  Filled 2022-02-13: qty 2

## 2022-02-13 MED ORDER — PROPOFOL 10 MG/ML IV BOLUS
INTRAVENOUS | Status: AC
Start: 2022-02-13 — End: ?
  Filled 2022-02-13: qty 20

## 2022-02-13 MED ORDER — FENTANYL CITRATE (PF) 100 MCG/2ML IJ SOLN
INTRAMUSCULAR | Status: DC | PRN
  Administered 2022-02-13 (×6): 50 ug via INTRAVENOUS

## 2022-02-13 MED ORDER — MIDAZOLAM HCL 2 MG/2ML IJ SOLN
INTRAMUSCULAR | Status: AC
  Filled 2022-02-13: qty 2

## 2022-02-13 MED ORDER — PHENYLEPHRINE 40 MCG/ML (10ML) SYRINGE FOR IV PUSH (FOR BLOOD PRESSURE SUPPORT)
PREFILLED_SYRINGE | INTRAVENOUS | Status: AC
  Filled 2022-02-13: qty 10

## 2022-02-13 MED ORDER — BUPIVACAINE-EPINEPHRINE (PF) 0.25% -1:200000 IJ SOLN
INTRAMUSCULAR | Status: AC
  Filled 2022-02-13: qty 30

## 2022-02-13 MED ORDER — PHENYLEPHRINE 40 MCG/ML (10ML) SYRINGE FOR IV PUSH (FOR BLOOD PRESSURE SUPPORT)
PREFILLED_SYRINGE | INTRAVENOUS | Status: DC | PRN
  Administered 2022-02-13: 80 ug via INTRAVENOUS

## 2022-02-13 MED ORDER — OXYCODONE HCL 5 MG/5ML PO SOLN: 5.0000 mg | Freq: Once | ORAL | Status: DC | PRN

## 2022-02-13 MED ORDER — CELECOXIB 200 MG PO CAPS
400.0000 mg | ORAL_CAPSULE | ORAL | Status: AC
  Administered 2022-02-13: 400 mg via ORAL
  Filled 2022-02-13: qty 2

## 2022-02-13 MED ORDER — METOCLOPRAMIDE HCL 5 MG/ML IJ SOLN
10.0000 mg | Freq: Four times a day (QID) | INTRAMUSCULAR | Status: DC
  Administered 2022-02-13 – 2022-02-14 (×4): 10 mg via INTRAVENOUS
  Filled 2022-02-13 (×4): qty 2

## 2022-02-13 MED ORDER — PROCHLORPERAZINE EDISYLATE 10 MG/2ML IJ SOLN
10.0000 mg | INTRAMUSCULAR | Status: DC | PRN
Start: 2022-02-13 — End: 2022-02-14

## 2022-02-13 SURGICAL SUPPLY — 45 items
BAG COUNTER SPONGE SURGICOUNT (BAG) ×2 IMPLANT
BINDER ABDOMINAL 12 ML 46-62 (SOFTGOODS) ×1 IMPLANT
CHLORAPREP W/TINT 26 (MISCELLANEOUS) ×2 IMPLANT
CLEANER CAUTERY TIP 5X5 PAD (MISCELLANEOUS) IMPLANT
COVER SURGICAL LIGHT HANDLE (MISCELLANEOUS) ×2 IMPLANT
DERMABOND ADVANCED (GAUZE/BANDAGES/DRESSINGS) ×2
DERMABOND ADVANCED .7 DNX12 (GAUZE/BANDAGES/DRESSINGS) ×1 IMPLANT
DRAIN CHANNEL 19F RND (DRAIN) ×1 IMPLANT
DRAPE LAPAROSCOPIC ABDOMINAL (DRAPES) ×2 IMPLANT
DRSG OPSITE POSTOP 4X10 (GAUZE/BANDAGES/DRESSINGS) ×1 IMPLANT
ELECT BLADE TIP CTD 4 INCH (ELECTRODE) ×2 IMPLANT
ELECT PENCIL ROCKER SW 15FT (MISCELLANEOUS) ×1 IMPLANT
ELECT REM PT RETURN 15FT ADLT (MISCELLANEOUS) ×2 IMPLANT
EVACUATOR SILICONE 100CC (DRAIN) ×1 IMPLANT
GAUZE 4X4 16PLY ~~LOC~~+RFID DBL (SPONGE) ×1 IMPLANT
GAUZE SPONGE 4X4 12PLY STRL (GAUZE/BANDAGES/DRESSINGS) ×1 IMPLANT
GLOVE SRG 8 PF TXTR STRL LF DI (GLOVE) ×1 IMPLANT
GLOVE SURG ENC MOIS LTX SZ7.5 (GLOVE) ×2 IMPLANT
GLOVE SURG UNDER POLY LF SZ8 (GLOVE) ×1
GOWN STRL REUS W/ TWL XL LVL3 (GOWN DISPOSABLE) ×3 IMPLANT
GOWN STRL REUS W/TWL XL LVL3 (GOWN DISPOSABLE) ×3
KIT BASIN OR (CUSTOM PROCEDURE TRAY) ×2 IMPLANT
KIT TURNOVER KIT A (KITS) IMPLANT
MARKER SKIN DUAL TIP RULER LAB (MISCELLANEOUS) ×2 IMPLANT
MESH SOFT 12X12IN BARD (Mesh General) ×1 IMPLANT
NS IRRIG 1000ML POUR BTL (IV SOLUTION) ×2 IMPLANT
PACK GENERAL/GYN (CUSTOM PROCEDURE TRAY) ×2 IMPLANT
PAD CLEANER CAUTERY TIP 5X5 (MISCELLANEOUS) ×1
PENCIL SMOKE EVACUATOR (MISCELLANEOUS) ×1 IMPLANT
SPONGE DRAIN TRACH 4X4 STRL 2S (GAUZE/BANDAGES/DRESSINGS) ×1 IMPLANT
SPONGE T-LAP 18X18 ~~LOC~~+RFID (SPONGE) ×4 IMPLANT
SUT ETHILON 2 0 PS N (SUTURE) ×2 IMPLANT
SUT MNCRL AB 4-0 PS2 18 (SUTURE) ×3 IMPLANT
SUT PDS AB 2-0 CT2 27 (SUTURE) ×8 IMPLANT
SUT STRAFIX SYMMETRIC 1-0 24 (SUTURE) ×4
SUT VIC AB 2-0 SH 18 (SUTURE) ×3 IMPLANT
SUT VIC AB 2-0 SH 27 (SUTURE) ×4
SUT VIC AB 2-0 SH 27X BRD (SUTURE) IMPLANT
SUT VIC AB 3-0 SH 8-18 (SUTURE) ×2 IMPLANT
SUTURE STRAFIX SYMMETRC 1-0 24 (SUTURE) IMPLANT
TAPE CLOTH SURG 4X10 WHT LF (GAUZE/BANDAGES/DRESSINGS) ×1 IMPLANT
TOWEL OR 17X26 10 PK STRL BLUE (TOWEL DISPOSABLE) ×2 IMPLANT
TOWEL OR NON WOVEN STRL DISP B (DISPOSABLE) ×2 IMPLANT
TOWEL ~~LOC~~+RFID 17X26 BLUE (SPONGE) ×1 IMPLANT
TRAY FOLEY MTR SLVR 16FR STAT (SET/KITS/TRAYS/PACK) ×1 IMPLANT

## 2022-02-13 NOTE — Anesthesia Procedure Notes (Signed)
Procedure Name: Intubation ?Date/Time: 02/13/2022 11:29 AM ?Performed by: Lind Covert, CRNA ?Pre-anesthesia Checklist: Patient identified, Emergency Drugs available, Suction available, Patient being monitored and Timeout performed ?Patient Re-evaluated:Patient Re-evaluated prior to induction ?Oxygen Delivery Method: Circle system utilized ?Preoxygenation: Pre-oxygenation with 100% oxygen ?Induction Type: IV induction ?Ventilation: Mask ventilation without difficulty ?Laryngoscope Size: Mac and 3 ?Grade View: Grade I ?Tube type: Oral ?Tube size: 7.5 mm ?Number of attempts: 1 ?Airway Equipment and Method: Stylet ?Placement Confirmation: ETT inserted through vocal cords under direct vision, positive ETCO2 and breath sounds checked- equal and bilateral ?Secured at: 22 cm ?Tube secured with: Tape ?Dental Injury: Teeth and Oropharynx as per pre-operative assessment  ? ? ? ? ?

## 2022-02-13 NOTE — Anesthesia Postprocedure Evaluation (Signed)
Anesthesia Post Note ? ?Patient: [facility] ? ?Procedure(s) Performed: OPEN INCISONAL HERNIA REPAIR WITH MESH (Abdomen) ? ?  ? ?Patient location during evaluation: PACU ?Anesthesia Type: General ?Level of consciousness: oriented, patient cooperative and sedated ?Pain management: pain level controlled ?Vital Signs Assessment: post-procedure vital signs reviewed and stable ?Respiratory status: spontaneous breathing, nonlabored ventilation and respiratory function stable ?Cardiovascular status: blood pressure returned to baseline and stable ?Postop Assessment: no apparent nausea or vomiting ?Anesthetic complications: no ? ? ?No notable events documented. ? ?Last Vitals:  ?Vitals:  ? 02/13/22 1524 02/13/22 1530  ?BP: (!) 130/97 130/70  ?Pulse: 100 (!) 105  ?Resp: 18 15  ?Temp: 36.9 ?C   ?SpO2: 100% 100%  ?  ?Last Pain:  ?Vitals:  ? 02/13/22 1530  ?TempSrc:   ?PainSc: 0-No pain  ? ? ?  ?  ?  ?  ?  ?  ? ?Loretta Doutt,E. Baylin Gamblin ? ? ? ? ?

## 2022-02-13 NOTE — Op Note (Signed)
? ?Patient: Thomas Gonzalez (1966/06/11, [id number]) ? ?Date of Surgery: 02/13/2022  ? ?Preoperative Diagnosis: INCISIONAL HERNIA  ? ?Postoperative Diagnosis: INCISIONAL HERNIA  ? ?Surgical Procedure: OPEN INCISONAL HERNIA REPAIR WITH MESH:   ? ?Operative Team Members:  ?Surgeon(s) and Role: ?   * Zamiya Dillard, Nickola Major, MD - Primary  ?Vernell Leep, MD - [facility] Resident ? ?Anesthesiologist: Josephine Igo, MD; Annye Asa, MD ?CRNA: Lind Covert, CRNA; Lollie Sails, CRNA  ? ?Anesthesia: General  ? ?Fluids:  ?Total I/O ?In: 2500 [I.V.:2500] ?Out: 700 [Urine:400; Blood:300] ? ?Complications: None ? ?Drains:  19 Fr. Blake drain in the retromuscular position ? ?Specimen: None ? ?Disposition:  PACU - hemodynamically stable. ? ?Plan of Care: Admit for overnight observation ? ?Indications for Procedure: ?Mr. Burnett Harry is a 56 year old male who underwent transhiatal total esophagectomy with cervical esophagogastrostomy and placement of a feeding tube with Dr. Servando Snare on 09/22/2020. He is finally recovered from this ordeal and completed chemotherapy, radiation and immunotherapy. Most recent scans demonstrate he is cancer free. He had some wound healing issues after surgery. His upper midline laparotomy incision did not heal well. On 01/27/2021, he underwent excision of 5 cm x 12 cm area of the upper midline laparotomy incision and placement of Myriad 10 cm x 10 cm with Dr. Marla Roe. Over the course the last year this wound is slowly healed to the point where he only now has a small area of tissue that is not epithelialized. Beneath the wound however he has a large incisional hernia. He has significant mount of bulging and discomfort from this hernia. He has never had obstructive problems or symptoms related to this hernia. He would like to get this hernia repaired.  ?  ? ?Incisional hernia, without obstruction or gangrene ?I recommended open incisional hernia repair with mesh with bilateral transversus abdominis  release. I described this procedure would include release of the lateral abdominal wall musculature and placement of a retromuscular mesh. The procedure itself as well as its risk, benefits, and alternatives were discussed with the patient in full. We discussed the option to observe the hernia, and continue to work with wound care to manage this wound and avoid another surgery. He says the hernia gets the way of him living his life and he would like it repaired. The risk discussed included were not limited to the risk of infection, bleeding, damage nearby structures, mesh complication, recurrent hernia, and need for additional surgeries or procedures postoperatively. After full discussion all questions answered the patient agreed and consent to proceed. We will proceed today as scheduled. ?  ?  ? ? ?Findings:  ?Hernia Location: Ventral hernia location: Subxiphoid (M1), Epigastric (M2), and Umbilical (M3) ?Hernia Size:  12 cm wide x 15  cm tall ?Mesh Size &Type:  30.5 cm x 30.5 cm Bard Soft Mesh ?Mesh Position: Sublay - Retromuscular ?Myofascial Releases: ?Bilateral Myofascial Release: Posterior rectus myofascial release ?Bilateral Myofascial Release: Transversus abdominis release ? ? ?Description of Procedure: ? ?The patient was positioned supine on the operating room table, adequately padded and secured.  A timeout procedure was performed. ? ?A midline incision was made and dissection was carried down through subcutaneous tissue. The prior scar was excised, completely. The abdomen was entered safely.  There were minimal adhesions.  A meticulous and tedious sharp lysis of adhesions was carried out.  This was completed with no injury to the viscera. ? ?After the anterior abdominal wall was cleared off of all adhesions, a large safety towel was  placed over the viscera to protect them.  The hernia defect was located in the Subxiphoid (M1), Epigastric (M2), and Umbilical (M3) region. The total hernia defect area was  measured and the size was recorded above under findings. ? ?A rectus myofascial release was performed on the LEFT side. The posterior rectus sheath was incised just lateral to the hernia edge.  This incision in the posterior rectus sheath was extended both cephalad and caudal to the hernia defect.  Dissection was carried out laterally in the retromuscular plane to the edge of the rectus sheath progressively disconnecting the rectus muscle from the underlying posterior rectus sheath. The segmental innervation comprised of intercostal nerve branches 8, 9, 10, 11 and 12 were individually identified and preserved.  The arterial blood supply to the rectus muscle comprised of the superior and inferior epigastric arteries along with the small terminal branches from the posterior intercostal arteries 10, 11 and 12, the subcostal artery, the posterior lumbar arteries, and the deep circumflex artery were all identified and individually preserved.  The rectus myofascial release accomplished medialization of the posterior rectus sheath towards the midline and disinsertion of the rectus muscle from its surrounding fascia, and thus its encasement in the rectus sheath, allowing for widening of the rectus muscle and transfer of the rectus flap towards the midline.  This will allow for future inset of the medial aspect of the flap for abdominal wall reconstruction. ? ?A transversus abdominis release (TAR) was performed on the LEFT side.  The transversus abdominis muscle was identified deep to the posterior rectus sheath and incised vertically along its entire length, entering the pre-peritoneal or pre-transversalis fascia plane.  This disinserted the transversus abdominis muscle from the linea semilunaris.  Since the intercostal nerves, arteries and veins had been preserved during the rectus myofascial release portion of the procedure, they remained intact during the TAR.  The peritoneum was subsequently peeled away from the  underside of the divided transversus abdominis muscle.  This dissection was carried out laterally towards the retroperitoneum.  The TAR accomplished additional medialization of the posterior rectus sheath with its attached peritoneum towards the midline to allow for visceral sac closure.  The TAR also provided further offset of tension of the rectus muscle flap with additional transfer of the rectus muscle towards the midline, as it remained attached to the external and internal abdominal oblique muscles.  This will allow for future inset of the medial aspect of the flap for abdominal wall reconstruction. ? ?A rectus myofascial release was performed on the RIGHT side. The posterior rectus sheath was incised just lateral to the hernia edge.  This incision in the posterior rectus sheath was extended both cephalad and caudal to the hernia defect.  Dissection was carried out laterally in the retromuscular plane to the edge of the rectus sheath progressively disconnecting the rectus muscle from the underlying posterior rectus sheath. The segmental innervation comprised of intercostal nerve branches 8, 9, 10, 11 and 12 were individually identified and preserved.  The arterial blood supply to the rectus muscle comprised of the superior and inferior epigastric arteries along with the small terminal branches from the posterior intercostal arteries 10, 11 and 12, the subcostal artery, the posterior lumbar arteries, and the deep circumflex artery were all identified and individually preserved.  The rectus myofascial release accomplished medialization of the posterior rectus sheath towards the midline and disinsertion of the rectus muscle from its surrounding fascia, and thus its encasement in the rectus sheath, allowing for widening of  the rectus muscle and transfer of the rectus flap towards the midline.  This will allow for future inset of the medial aspect of the flap for abdominal wall reconstruction. ? ?A transversus  abdominis release (TAR) was performed on the RIGHT side.   The transversus abdominis muscle was identified deep to the posterior rectus sheath and incised vertically along its entire length, entering the pre-periton

## 2022-02-13 NOTE — Anesthesia Preprocedure Evaluation (Addendum)
Anesthesia Evaluation  ?Patient identified by MRN, date of birth, ID band ?Patient awake ? ? ? ?Reviewed: ?Allergy & Precautions, NPO status , Patient's Chart, lab work & pertinent test results, reviewed documented beta blocker date and time  ? ?Airway ?Mallampati: II ? ?TM Distance: >3 FB ?Neck ROM: Full ? ? ? Dental ?no notable dental hx. ?(+) Teeth Intact, Dental Advisory Given ?  ?Pulmonary ?former smoker,  ?  ?Pulmonary exam normal ?breath sounds clear to auscultation ? ? ? ? ? ? Cardiovascular ?negative cardio ROS ?Normal cardiovascular exam ?Rhythm:Regular Rate:Normal ? ? ?  ?Neuro/Psych ?Hx/o sciatica ? Neuromuscular disease negative psych ROS  ? GI/Hepatic ?Neg liver ROS, GERD  Medicated,Esophageal Ca S/P esophagectomy with immunotherapy ?Incisional hernia ?  ?Endo/Other  ?negative endocrine ROS ? Renal/GU ?Hx/o renal calculi  ?negative genitourinary ?  ?Musculoskeletal ?negative musculoskeletal ROS ?(+)  ? Abdominal ?  ?Peds ? Hematology ?negative hematology ROS ?(+)   ?Anesthesia Other Findings ? ? Reproductive/Obstetrics ? ?  ? ? ? ? ? ? ? ? ? ? ? ? ? ?  ?  ? ? ? ? ? ? ? ?Anesthesia Physical ?Anesthesia Plan ? ?ASA: 2 ? ?Anesthesia Plan: General  ? ?Post-op Pain Management:   ? ?Induction: Intravenous and Cricoid pressure planned ? ?PONV Risk Score and Plan: 4 or greater and Treatment may vary due to age or medical condition, Midazolam, Dexamethasone and Ondansetron ? ?Airway Management Planned: Oral ETT ? ?Additional Equipment: None ? ?Intra-op Plan:  ? ?Post-operative Plan: Extubation in OR ? ?Informed Consent: I have reviewed the patients History and Physical, chart, labs and discussed the procedure including the risks, benefits and alternatives for the proposed anesthesia with the patient or authorized representative who has indicated his/her understanding and acceptance.  ? ? ? ?Dental advisory given ? ?Plan Discussed with: Anesthesiologist and CRNA ? ?Anesthesia  Plan Comments:   ? ? ? ? ? ?Anesthesia Quick Evaluation ? ?

## 2022-02-13 NOTE — H&P (Signed)
? ?Admitting Physician: Nickola Major Akeia Perot ? ?Service: General surgery ? ?CC: incisional hernia ? ?Subjective  ? ?HPI: ?Thomas Gonzalez is an 56 y.o. male who is here for incisional hernia ? ?Past Medical History:  ?Diagnosis Date  ? Bowel obstruction ([place])   ? 06/21/2017 reported- "years ago"  ? Cancer [facility])   ? basal cell face  ? GERD (gastroesophageal reflux disease)   ? History of kidney stones   ? x 2  ? ? ?Past Surgical History:  ?Procedure Laterality Date  ? APPENDECTOMY  2019  ? APPLICATION OF A-CELL OF EXTREMITY N/A 01/27/2021  ? Procedure: APPLICATION OF MYRIAD OR MatriDerm;  Surgeon: Wallace Going, DO;  Location: [place];  Service: Plastics;  Laterality: N/A;  ? COMPLETE ESOPHAGECTOMY N/A 09/20/2020  ? Procedure: Transhiatal total ESOPHAGECTOMY with Pyloroplasty;  Surgeon: Grace Isaac, MD;  Location: [facility] OR;  Service: Thoracic;  Laterality: N/A;  ? HERNIA REPAIR Bilateral 2007,2010,2020  ? Inguinial   ? INCISION AND DRAINAGE OF WOUND N/A 01/27/2021  ? Procedure: Debridement of abdominal wound;  Surgeon: Wallace Going, DO;  Location: [place];  Service: Plastics;  Laterality: N/A;  45 min  ? JEJUNOSTOMY N/A 09/20/2020  ? Procedure: JEJUNOSTOMY;  Surgeon: Grace Isaac, MD;  Location: [facility] OR;  Service: Thoracic;  Laterality: N/A;  ? KNEE ARTHROSCOPY Left 06/27/2017  ? Procedure: ARTHROSCOPY KNEE;  Surgeon: Dorna Leitz, MD;  Location: [place];  Service: Orthopedics;  Laterality: Left;  ? REMOVAL OF GASTROSTOMY TUBE  09/20/2020  ? Procedure: REMOVAL OF GASTROSTOMY TUBE;  Surgeon: Grace Isaac, MD;  Location: [place];  Service: Thoracic;;  ? VIDEO BRONCHOSCOPY N/A 09/20/2020  ? Procedure: VIDEO BRONCHOSCOPY;  Surgeon: Grace Isaac, MD;  Location: [place];  Service: Thoracic;  Laterality: N/A;  ? ? ?Family History  ?Family history unknown: Yes  ? ? ?Social:  reports that he has quit smoking. His smoking use included cigarettes. He has quit using  smokeless tobacco. He reports that he does not drink alcohol and does not use drugs. ? ?Allergies:  ?Allergies  ?Allergen Reactions  ? Penicillins Anaphylaxis  ? Prednisone Anxiety  ?  Mood swings  ? ? ?Medications: ?Current Outpatient Medications  ?Medication Instructions  ? acetaminophen (TYLENOL) 650 mg, Oral, Every 6 hours PRN  ? ascorbic acid (VITAMIN C) 500 mg, Oral, Daily  ? cholecalciferol (VITAMIN D3) 1,000 Units, Oral, Daily  ? zinc gluconate 50 mg, Oral, Daily  ? ? ?ROS - all of the below systems have been reviewed with the patient and positives are indicated with bold text ?General: chills, fever or night sweats ?Eyes: blurry vision or double vision ?ENT: epistaxis or sore throat ?Allergy/Immunology: itchy/watery eyes or nasal congestion ?Hematologic/Lymphatic: bleeding problems, blood clots or swollen lymph nodes ?Endocrine: temperature intolerance or unexpected weight changes ?Breast: new or changing breast lumps or nipple discharge ?Resp: cough, shortness of breath, or wheezing ?CV: chest pain or dyspnea on exertion ?GI: as per HPI ?GU: dysuria, trouble voiding, or hematuria ?MSK: joint pain or joint stiffness ?Neuro: TIA or stroke symptoms ?Derm: pruritus and skin lesion changes ?Psych: anxiety and depression ? ?Objective  ? ?PE ?Blood pressure 137/81, pulse 81, temperature 97.9 ?F (36.6 ?C), temperature source Oral, resp. rate 18, height 5' 9"  (1.753 m), weight 86.2 kg, SpO2 100 %. ?Constitutional: NAD; conversant; no deformities ?Eyes: Moist conjunctiva; no lid lag; anicteric; PERRL ?Neck: Trachea midline; no thyromegaly ?Lungs: Normal respiratory effort; no tactile fremitus ?  CV: RRR; no palpable thrills; no pitting edema ?GI: Abd Epigastric incisional hernia; no palpable hepatosplenomegaly ?MSK: Normal range of motion of extremities; no clubbing/cyanosis ?Psychiatric: Appropriate affect; alert and oriented x3 ?Lymphatic: No palpable cervical or axillary lymphadenopathy ? ?No results found for this  or any previous visit (from the past 24 hour(s)). ? ?Imaging Orders  ?No imaging studies ordered today  ?CT Abd/Pel 11/02/21 reviewed ? ? ?Assessment and Plan  ? ?Mr. Thomas Gonzalez is a 56 year old male who underwent transhiatal total esophagectomy with cervical esophagogastrostomy and placement of a feeding tube with Dr. Servando Snare on 09/22/2020. He is finally recovered from this ordeal and completed chemotherapy, radiation and immunotherapy. Most recent scans demonstrate he is cancer free. He had some wound healing issues after surgery. His upper midline laparotomy incision did not heal well. On 01/27/2021, he underwent excision of 5 cm x 12 cm area of the upper midline laparotomy incision and placement of Myriad 10 cm x 10 cm with Dr. Marla Roe. Over the course the last year this wound is slowly healed to the point where he only now has a small area of tissue that is not epithelialized. Beneath the wound however he has a large incisional hernia. He has significant mount of bulging and discomfort from this hernia. He has never had obstructive problems or symptoms related to this hernia. He would like to get this hernia repaired.  ? ? ?Incisional hernia, without obstruction or gangrene ? ?I recommended open incisional hernia repair with mesh with bilateral transversus abdominis release. I described this procedure would include release of the lateral abdominal wall musculature and placement of a retromuscular mesh. The procedure itself as well as its risk, benefits, and alternatives were discussed with the patient in full. We discussed the option to observe the hernia, and continue to work with wound care to manage this wound and avoid another surgery. He says the hernia gets the way of him living his life and he would like it repaired. The risk discussed included were not limited to the risk of infection, bleeding, damage nearby structures, mesh complication, recurrent hernia, and need for additional surgeries or procedures  postoperatively. After full discussion all questions answered the patient agreed and consent to proceed. We will proceed today as scheduled. ?  ? ? ?Felicie Morn, MD ? ?[facility] ?Use [email or web address] to contact on call provider ? ? ? ?

## 2022-02-13 NOTE — Transfer of Care (Signed)
Immediate Anesthesia Transfer of Care Note ? ?Patient: Thomas Gonzalez [facility] ? ?Procedure(s) Performed: OPEN INCISONAL HERNIA REPAIR WITH MESH (Abdomen) ? ?Patient Location: PACU ? ?Anesthesia Type:General ? ?Level of Consciousness: sedated ? ?Airway & Oxygen Therapy: Patient Spontanous Breathing and Patient connected to face mask oxygen ? ?Post-op Assessment: Report given to RN and Post -op Vital signs reviewed and stable ? ?Post vital signs: Reviewed and stable ? ?Last Vitals:  ?Vitals Value Taken Time  ?BP 130/97 02/13/22 1524  ?Temp    ?Pulse 95 02/13/22 1525  ?Resp 17 02/13/22 1525  ?SpO2 100 % 02/13/22 1525  ?Vitals shown include unvalidated device data. ? ?Last Pain:  ?Vitals:  ? 02/13/22 0948  ?TempSrc:   ?PainSc: 0-No pain  ?   ? ?  ? ?Complications: No notable events documented. ?

## 2022-02-14 ENCOUNTER — Encounter (HOSPITAL_COMMUNITY): Payer: Self-pay | Admitting: Surgery

## 2022-02-14 LAB — CBC
HCT: 40.7 % (ref 39.0–52.0)
Hemoglobin: 14 g/dL (ref 13.0–17.0)
MCH: 32.6 pg (ref 26.0–34.0)
MCHC: 34.4 g/dL (ref 30.0–36.0)
MCV: 94.7 fL (ref 80.0–100.0)
Platelets: 173 10*3/uL (ref 150–400)
RBC: 4.3 MIL/uL (ref 4.22–5.81)
RDW: 13 % (ref 11.5–15.5)
WBC: 14.4 10*3/uL — ABNORMAL HIGH (ref 4.0–10.5)
nRBC: 0 % (ref 0.0–0.2)

## 2022-02-14 LAB — BASIC METABOLIC PANEL
Anion gap: 4 — ABNORMAL LOW (ref 5–15)
BUN: 20 mg/dL (ref 6–20)
CO2: 28 mmol/L (ref 22–32)
Calcium: 8.2 mg/dL — ABNORMAL LOW (ref 8.9–10.3)
Chloride: 104 mmol/L (ref 98–111)
Creatinine, Ser: 0.91 mg/dL (ref 0.61–1.24)
GFR, Estimated: >60 mL/min (ref >60)
Glucose, Bld: 134 mg/dL — ABNORMAL HIGH (ref 70–99)
Potassium: 4.8 mmol/L (ref 3.5–5.1)
Sodium: 136 mmol/L (ref 135–145)

## 2022-02-14 NOTE — Progress Notes (Addendum)
PT Cancellation Note ? ?Patient Details ?Name: Thomas Gonzalez ?MRN: [id number] ?DOB: 19-Aug-1966 ? ? ?Cancelled Treatment:    Reason Eval/Treat Not Completed: PT screened, no needs identified, will sign off . Per OT, PT eval not needed and pt can be screen. Will sign off. Appears  pt has been discharged per chart review.  ? ? ? ?Doreatha Massed, PT ?Acute Rehabilitation  ?Office: [phone] ?Pager: [phone] ? ?  ?

## 2022-02-14 NOTE — Discharge Summary (Signed)
?  Patient ID: ?Thomas Gonzalez ?[id number] ?56 y.o. ?1965/12/16 ? ?02/13/2022 ? ?Discharge date and time: 02/14/2022 ? ?Admitting Physician: Thomas Gonzalez ? ?Discharge Physician: Thomas Gonzalez ? ?Admission Diagnoses: Ventral hernia [K43.9] ?Patient Active Problem List  ? Diagnosis Date Noted  ? Ventral hernia 02/13/2022  ? Abscess of right axilla 02/03/2021  ? Open abdominal wall wound 01/07/2021  ? H/O esophagectomy 09/20/2020  ? Sciatica 07/14/2020  ? Esophageal carcinoma (Thomas Gonzalez) 07/06/2020  ? Melena 02/06/2020  ? ? ? ?Discharge Diagnoses: Incisional hernia ?Patient Active Problem List  ? Diagnosis Date Noted  ? Ventral hernia 02/13/2022  ? Abscess of right axilla 02/03/2021  ? Open abdominal wall wound 01/07/2021  ? H/O esophagectomy 09/20/2020  ? Sciatica 07/14/2020  ? Esophageal carcinoma (Thomas Gonzalez) 07/06/2020  ? Melena 02/06/2020  ? ? ?Operations: Procedure(s): ?OPEN INCISONAL HERNIA REPAIR WITH MESH ? ?Admission Condition: good ? ?Discharged Condition: good ? ?Indication for Admission: Ventral hernia ? ?Hospital Course: Mr. Thomas Gonzalez presented with an incisional hernia.  He underwent open repair and was discharged the following day. ? ?Consults: None ? ?Significant Diagnostic Studies: None ? ?Treatments: surgery: as above ? ?Disposition: Home ? ?Patient Instructions:  ?Allergies as of 02/14/2022   ? ?   Reactions  ? Penicillins Anaphylaxis  ? Prednisone Anxiety  ? Mood swings  ? ?  ? ?  ?Medication List  ?  ? ?TAKE these medications   ? ?acetaminophen 325 MG tablet ?Commonly known as: Tylenol ?Take 2 tablets (650 mg total) by mouth every 6 (six) hours as needed. ?What changed: reasons to take this ?  ?ascorbic acid 500 MG tablet ?Commonly known as: VITAMIN C ?Take 500 mg by mouth daily. ?  ?cholecalciferol 25 MCG (1000 UNIT) tablet ?Commonly known as: VITAMIN D3 ?Take 1,000 Units by mouth daily. ?  ?oxyCODONE-acetaminophen 5-325 MG tablet ?Commonly known as: Percocet ?Take 1 tablet by mouth every 4 (four)  hours as needed for severe pain. ?  ?zinc gluconate 50 MG tablet ?Take 50 mg by mouth daily. ?  ? ?  ? ? ?Activity: no heavy lifting for 4-6 weeks ?Diet: regular diet ?Wound Care: keep wound clean and dry ? ?Follow-up:  With Dr. Thermon Gonzalez in 4 weeks. ? ?Signed: ?Thomas Gonzalez ?General, Bariatric, & Minimally Invasive Surgery ?[facility], [place] ? ? ?02/14/2022, 11:13 AM ? ?

## 2022-02-14 NOTE — Discharge Instructions (Signed)
 VENTRAL HERNIA REPAIR POST OPERATIVE INSTRUCTIONS  Thinking Clearly  The anesthesia may cause you to feel different for 1 or 2 days. Do not drive, drink alcohol, or make any big decisions for at least 2 days.  Nutrition When you wake up, you will be able to drink small amounts of liquid. If you do not feel sick, you can slowly advance your diet to regular foods. Continue to drink lots of fluids, usually about 8 to 10 glasses per day. Eat a high-fiber diet so you don't strain during bowel movements. High-Fiber Foods Foods high in fiber include beans, bran cereals and whole-grain breads, peas, dried fruit (figs, apricots, and dates), raspberries, blackberries, strawberries, sweet corn, broccoli, baked potatoes with skin, plums, pears, apples, greens, and nuts. Activity Slowly increase your activity. Be sure to get up and walk every hour or so to prevent blood clots. No heavy lifting or strenuous activity for 4 weeks following surgery to prevent hernias at your incision sites or recurrence of your hernia. It is normal to feel tired. You may need more sleep than usual.  Get your rest but make sure to get up and move around frequently to prevent blood clots and pneumonia.  Work and Return to School You can go back to work when you feel well enough. Discuss the timing with your surgeon. You can usually go back to school or work 1 week or less after an laparoscopic or an open repair. If your work requires heavy lifting or strenuous activity you need to be placed on light duty for 4 weeks following surgery. You can return to gym class, sports or other physical activities 4 weeks after surgery.  Wound Care You may experience significant bruising throughout the abdominal wall that may track down into the groin including into the scrotum in males.  Rest, elevating the groin and scrotum above the level of the heart, ice and compression with tight fitting underwear or an abdominal binder can help.   Always wash your hands before and after touching near your incision site. Do not soak in a bathtub until cleared at your follow up appointment. You may take a shower 24 hours after surgery. A small amount of drainage from the incision is normal. If the drainage is thick and yellow or the site is red, you may have an infection, so call your surgeon. If you have a drain in one of your incisions, it will be taken out in office when the drainage stops. Steri-Strips will fall off in 7 to 10 days or they will be removed during your first office visit. If you have dermabond glue covering over the incision, allow the glue to flake off on its own. Protect the new skin, especially from the sun. The sun can burn and cause darker scarring. Your scar will heal in about 4 to 6 weeks and will become softer and continue to fade over the next year.  The cosmetic appearance of the incisions will improve over the course of the first year after surgery. Sensation around your incision will return in a few weeks or months.  Bowel Movements After intestinal surgery, you may have loose watery stools for several days. If watery diarrhea lasts longer than 3 days, contact your surgeon. Pain medication (narcotics) can cause constipation. Increase the fiber in your diet with high-fiber foods if you are constipated. You can take an over the counter stool softener like Colace to avoid constipation.  Additional over the counter medications can also be used   if Colace isn't sufficient (for example, Milk of Magnesia or Miralax).  Pain The amount of pain is different for each person. Some people need only 1 to 3 doses of pain control medication, while others need more. Take alternating doses of tylenol and ibuprofen around the clock for the first five days following surgery.  This will provide a baseline of pain control and help with inflammation.  Take the narcotic pain medication in addition if needed for severe pain.  Contact  Your Surgeon at [phone], if you have: Pain that will not go away Pain that gets worse A fever of more than 101F (38.3C) Repeated vomiting Swelling, redness, bleeding, or bad-smelling drainage from your wound site Strong abdominal pain No bowel movement or unable to pass gas for 3 days Watery diarrhea lasting longer than 3 days  Pain Control The goal of pain control is to minimize pain, keep you moving and help you heal. Your surgical team will work with you on your pain plan. Most often a combination of therapies and medications are used to control your pain. You may also be given medication (local anesthetic) at the surgical site. This may help control your pain for several days. Extreme pain puts extra stress on your body at a time when your body needs to focus on healing. Do not wait until your pain has reached a level "10" or is unbearable before telling your doctor or nurse. It is much easier to control pain before it becomes severe. Following a laparoscopic procedure, pain is sometimes felt in the shoulder. This is due to the gas inserted into your abdomen during the procedure. Moving and walking helps to decrease the gas and the right shoulder pain.  Use the guide below for ways to manage your post-operative pain. Learn more by going to facs.org/safepaincontrol.  How Intense Is My Pain Common Therapies to Feel Better       I hardly notice my pain, and it does not interfere with my activities.  I notice my pain and it distracts me, but I can still do activities (sitting up, walking, standing).  Non-Medication Therapies  Ice (in a bag, applied over clothing at the surgical site), elevation, rest, meditation, massage, distraction (music, TV, play) walking and mild exercise Splinting the abdomen with pillows +  Non-Opioid Medications Acetaminophen (Tylenol) Non-steroidal anti-inflammatory drugs (NSAIDS) Aspirin, Ibuprofen (Motrin, Advil) Naproxen (Aleve) Take these as  needed, when you feel pain. Both acetaminophen and NSAIDs help to decrease pain and swelling (inflammation).      My pain is hard to ignore and is more noticeable even when I rest.  My pain interferes with my usual activities.  Non-Medication Therapies  +  Non-Opioid medications  Take on a regular schedule (around-the-clock) instead of as needed. (For example, Tylenol every 6 hours at 9:00 am, 3:00 pm, 9:00 pm, 3:00 am and Motrin every 6 hours at 12:00 am, 6:00 am, 12:00 pm, 6:00 pm)         I am focused on my pain, and I am not doing my daily activities.  I am groaning in pain, and I cannot sleep. I am unable to do anything.  My pain is as bad as it could be, and nothing else matters.  Non-Medication Therapies  +  Around-the-Clock Non-Opioid Medications  +  Short-acting opioids  Opioids should be used with other medications to manage severe pain. Opioids block pain and give a feeling of euphoria (feel high). Addiction, a serious side effect of opioids, is   rare with short-term (a few days) use.  Examples of short-acting opioids include: Tramadol (Ultram), Hydrocodone (Norco, Vicodin), Hydromorphone (Dilaudid), Oxycodone (Oxycontin)     The above directions have been adapted from the American College [employer] Surgical Patient Education Program.  Please refer to the ACS website if needed: [email or web address]://[email or web address]/-/media/[email or web address]   Clerence Gubser, MD [facility] [address], [address], [place], [place]  [address] ?  [address] [address], [place], [place]   [address] [phone] ? [phone] ? FAX [phone] Web site: [email or web address]  

## 2022-02-14 NOTE — Evaluation (Signed)
Occupational Therapy Evaluation ?Patient Details ?Name: Thomas Gonzalez [facility] ?MRN: [id number] ?DOB: 09/06/1966 ?Today's Date: 02/14/2022 ? ? ?History of Present Illness Patient is a 56 year old male s/p hernia repair with mesh. PMH: appendectomy, basal cell CA  ? ?Clinical Impression ?  ?Patient lives at home with spouse and is independent at baseline. Patient performs self care tasks listed below without any physical assistance, no loss of balance noted. Patient reports has already walked in hallways without any difficulty earlier this AM. No acute OT needs identified, will sign off.   ?   ? ?Recommendations for follow up therapy are one component of a multi-disciplinary discharge planning process, led by the attending physician.  Recommendations may be updated based on patient status, additional functional criteria and insurance authorization.  ? ?Follow Up Recommendations ? No OT follow up  ?  ?Assistance Recommended at Discharge None  ?   ?Functional Status Assessment ? Patient has not had a recent decline in their functional status  ?Equipment Recommendations ? None recommended by OT  ?  ?   ?Precautions / Restrictions Precautions ?Precautions: None ?Restrictions ?Weight Bearing Restrictions: No  ? ?  ? ?Mobility Bed Mobility ?  ?  ?  ?  ?  ?  ?  ?General bed mobility comments: in recliner ?  ? ?Transfers ?Overall transfer level: Independent ?  ?  ?  ?  ?  ?  ?  ?  ?  ?  ? ?  ?Balance Overall balance assessment: Independent ?  ?  ?  ?  ?  ?  ?  ?  ?  ?  ?  ?  ?  ?  ?  ?  ?  ?  ?   ? ?ADL either performed or assessed with clinical judgement  ? ?ADL Overall ADL's : Independent ?  ?  ?  ?  ?  ?  ?  ?  ?  ?  ?  ?  ?  ?  ?  ?  ?  ?  ?  ?General ADL Comments: Patient able to transfer, ambulate and perform oral care standing at sink without any assistance needed.  ? ? ? ? ?Pertinent Vitals/Pain Pain Assessment ?Pain Assessment: No/denies pain  ? ? ? ?Hand Dominance Left ?  ?Extremity/Trunk Assessment Upper Extremity  Assessment ?Upper Extremity Assessment: Overall WFL for tasks assessed ?  ?Lower Extremity Assessment ?Lower Extremity Assessment: Overall WFL for tasks assessed ?  ?Cervical / Trunk Assessment ?Cervical / Trunk Assessment: Normal ?  ?Communication Communication ?Communication: No difficulties ?  ?Cognition Arousal/Alertness: Awake/alert ?Behavior During Therapy: [facility] for tasks assessed/performed ?Overall Cognitive Status: Within Functional Limits for tasks assessed ?  ?  ?  ?  ?  ?  ?  ?  ?  ?  ?  ?  ?  ?  ?  ?  ?  ?  ?  ?   ?   ?   ? ? ?Home Living Family/patient expects to be discharged to:: Private residence ?Living Arrangements: Spouse/significant other ?Available Help at Discharge: Family ?Type of Home: House ?Home Access: Stairs to enter ?Entrance Stairs-Number of Steps: back deck 4, front 4 ?  ?Home Layout: One level ?  ?  ?Bathroom Shower/Tub: Tub/shower unit ?  ?Bathroom Toilet: Standard ?  ?  ?  ?  ?  ?  ? ?  ?Prior Functioning/Environment Prior Level of Function : Independent/Modified Independent ?  ?  ?  ?  ?  ?  ?  ?  ?  ? ?  ?  ?  OT Problem List: Decreased activity tolerance ?  ?   ?   ?OT Goals(Current goals can be found in the care plan section) Acute Rehab OT Goals ?Patient Stated Goal: "get my life back" ?OT Goal Formulation: All assessment and education complete, DC therapy  ? ?AM-PAC OT "6 Clicks" Daily Activity     ?Outcome Measure Help from another person eating meals?: None ?Help from another person taking care of personal grooming?: None ?Help from another person toileting, which includes using toliet, bedpan, or urinal?: None ?Help from another person bathing (including washing, rinsing, drying)?: None ?Help from another person to put on and taking off regular upper body clothing?: None ?Help from another person to put on and taking off regular lower body clothing?: None ?6 Click Score: 24 ?  ?End of Session Equipment Utilized During Treatment:  (none) ?Nurse Communication: Mobility  status ? ?Activity Tolerance: Patient tolerated treatment well ?Patient left: in chair;with call bell/phone within reach ? ?OT Visit Diagnosis: Other abnormalities of gait and mobility (R26.89)  ?              ?Time: 0802-0811 ?OT Time Calculation (min): 9 min ?Charges:  OT General Charges ?$OT Visit: 1 Visit ?OT Evaluation ?$OT Eval Low Complexity: 1 Low ? ?Thomas Gonzalez OT ?OT pager: [phone] ? ?Thomas Gonzalez ?02/14/2022, 12:19 PM ?

## 2022-02-27 ENCOUNTER — Telehealth: Payer: Self-pay | Admitting: Plastic Surgery

## 2022-03-08 NOTE — Telephone Encounter (Signed)
Patient called to discuss medical concerns. ?

## 2022-03-28 ENCOUNTER — Telehealth: Payer: Self-pay

## 2022-03-28 NOTE — Telephone Encounter (Signed)
Opened in error

## 2022-05-04 NOTE — Progress Notes (Signed)
[place]  [address] [place],  [place]  [address] [phone]  Clinic Day:  11/04/2021  Referring physician: Lillard Anes,*  This document serves as a record of services personally performed by [name], MD. It was created on their behalf by [facility], a trained medical scribe. The creation of this record is based on the scribe's personal observations and the provider's statements to them.  HISTORY OF PRESENT ILLNESS:  The patient is a 56 y.o. 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  He completed 12 cycles of maintenance nivolumab in November 2022.  He comes in today to go over his CT scan to assess his new disease baseline after the completion of all of his definitive therapy.  Since his last visit, the patient has been doing well.  He denies having any dysphagia, abdominal discomfort or other GI symptoms which concern him for disease recurrence.     With respect to his GE junction cancer treatment history, he completed neoadjuvant chemoradiation, which consisted of 3 cycles of FOLFOX chemotherapy.  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  Pathology from this surgery showed persistent adenocarcinoma which extended into the muscularis propria level of his stomach.  Based upon this, he underwent adjuvant nivolumab immunotherapy for 1 year.   PHYSICAL EXAM:  There were no vitals taken for this visit. Wt Readings from Last 3 Encounters:  02/13/22 190 lb (86.2 kg)  11/04/21 192 lb 1.6 oz (87.1 kg)  09/23/21 191 lb (86.6 kg)   There is no height or weight on file to calculate BMI. Performance status (ECOG): 1 - Symptomatic but completely ambulatory Physical Exam Constitutional:      General: He is not in acute distress.    Appearance: Normal appearance. He is normal weight.  HENT:     Head: Normocephalic and atraumatic.  Eyes:     General: No scleral  icterus.    Extraocular Movements: Extraocular movements intact.     Conjunctiva/sclera: Conjunctivae normal.     Pupils: Pupils are equal, round, and reactive to light.  Cardiovascular:     Rate and Rhythm: Normal rate and regular rhythm.     Pulses: Normal pulses.     Heart sounds: Normal heart sounds. No murmur heard.    No friction rub. No gallop.  Pulmonary:     Effort: Pulmonary effort is normal. No respiratory distress.     Breath sounds: Normal breath sounds.  Abdominal:     General: Bowel sounds are normal. There is no distension.     Palpations: Abdomen is soft. There is no hepatomegaly, splenomegaly or mass.     Tenderness: There is no abdominal tenderness.  Musculoskeletal:        General: Normal range of motion.     Cervical back: Normal range of motion and neck supple.     Right lower leg: No edema.     Left lower leg: No edema.  Lymphadenopathy:     Cervical: No cervical adenopathy.  Skin:    General: Skin is warm and dry.  Neurological:     General: No focal deficit present.     Mental Status: He is alert and oriented to person, place, and time. Mental status is at baseline.  Psychiatric:        Mood and Affect: Mood normal.        Behavior: Behavior normal.  Thought Content: Thought content normal.        Judgment: Judgment normal.   SCANS:  CT scans of his abdomen/pelvis revealed the following:   FINDINGS:  CT CHEST FINDINGS  Cardiovascular: Accessed right chest wall Port-A-Cath with tip at  the superior cavoatrial junction. No thoracic aortic aneurysm or  dissection. No central pulmonary embolus on this nondedicated study.  Three-vessel coronary artery calcifications. Normal size heart. No  significant pericardial effusion/thickening.  Mediastinum/Nodes: No supraclavicular adenopathy. No discrete  thyroid nodularity. No pathologically enlarged mediastinal, hilar or  axillary lymph nodes. Stable gastric pull-through anatomy with  anastomosis at  the thoracic inlet.  Lungs/Pleura: Scattered small pulmonary nodules are unchanged for  instance in the right upper lobe on image 21/4, in the right lower  lobe on image 48/4, and in the left upper lobe on image 42/4 stable  over multiple priors consistent with benign etiology. No new  suspicious pulmonary nodules or masses. Left lower lobe calcified  granuloma. No pleural effusion. No pneumothorax.  Musculoskeletal: Thoracic spondylosis. Degenerative changes  bilateral shoulders. No aggressive lytic or blastic lesion of bone.  CT ABDOMEN FINDINGS Hepatobiliary: No suspicious hepatic lesion. Gallbladder is  unremarkable. No biliary ductal dilation.  Pancreas: No pancreatic ductal dilation or evidence of acute  inflammation.  Spleen: Within normal limits.  Adrenals/Urinary Tract: Bilateral adrenal glands are unremarkable.  No hydronephrosis. No solid enhancing renal mass.  Stomach/Bowel: Radiopaque enteric contrast material traverses the  hepatic flexure. Gastric pull-through anatomy appears stable from  prior. No pathologic dilation or evidence of acute inflammation  involving loops of large or small bowel in the abdomen. Moderate  colonic stool burden likely reflecting constipation.  Vascular/Lymphatic: No abdominal aortic aneurysm. No pathologically  enlarged abdominal lymph nodes.  Other: No significant abdominal free fluid. No discrete peritoneal  or omental nodularity. Laxity of the ventral abdominal wall with  prior ventral hernia repair.  Musculoskeletal: Mild spondylosis. No aggressive lytic or blastic  lesion of bone.  IMPRESSION:  1. Status post esophagectomy with gastric pull-through anatomy. No  evidence of recurrent or metastatic disease in the chest or abdomen.   ASSESSMENT & PLAN:  Assessment/Plan:  A 56 y.o. 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 In clinic today, I went over his CT scans with him, for which he could see he is  disease free.  Clinically, he is doing very well.  Moving forward, disease surveillance will take place.  I will see him back in 6 months for repeat clinical assessment.  CT scans will be done a day before his next visit to ensure he remains disease free.  The patient understands all the plans discussed today and is in agreement with them.     I, Rita Ohara, am acting as scribe for [name], MD    I have reviewed this report as typed by the medical scribe, and it is complete and accurate.  Klani Caridi Macarthur Critchley, MD

## 2022-05-05 ENCOUNTER — Other Ambulatory Visit: Payer: Self-pay | Admitting: Oncology

## 2022-05-05 ENCOUNTER — Inpatient Hospital Stay: Payer: Commercial Managed Care - HMO | Attending: Oncology | Admitting: Oncology

## 2022-05-05 VITALS — BP 117/68 | HR 83 | Temp 99.2°F | Resp 16 | Ht 69.0 in | Wt 189.5 lb

## 2022-05-05 DIAGNOSIS — C159 Malignant neoplasm of esophagus, unspecified: Secondary | ICD-10-CM

## 2022-05-05 DIAGNOSIS — S31109D Unspecified open wound of abdominal wall, unspecified quadrant without penetration into peritoneal cavity, subsequent encounter: Secondary | ICD-10-CM

## 2022-05-05 NOTE — Progress Notes (Incomplete)
[facility]  [address] [place],  [place]  [address] [phone]  Clinic Day:  11/04/2021  Referring physician: No ref. provider found  This document serves as a record of services personally performed by [name], MD. It was created on their behalf by Curry,[name] E, a trained medical scribe. The creation of this record is based on the scribe's personal observations and the provider's statements to them.  HISTORY OF PRESENT ILLNESS:  The patient is a 56 y.o. 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  He completed 12 cycles of maintenance nivolumab in November 2022.  He comes in today to go over his CT scan to assess his new disease baseline after the completion of all of his definitive therapy.  Since his last visit, the patient has been doing well.  He denies having any dysphagia, abdominal discomfort or other GI symptoms which concern him for disease recurrence.     With respect to his GE junction cancer treatment history, he completed neoadjuvant chemoradiation, which consisted of 3 cycles of FOLFOX chemotherapy.  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  Pathology from this surgery showed persistent adenocarcinoma which extended into the muscularis propria level of his stomach.  Based upon this, he underwent adjuvant nivolumab immunotherapy for 1 year.   PHYSICAL EXAM:  There were no vitals taken for this visit. Wt Readings from Last 3 Encounters:  02/13/22 190 lb (86.2 kg)  11/04/21 192 lb 1.6 oz (87.1 kg)  09/23/21 191 lb (86.6 kg)   There is no height or weight on file to calculate BMI. Performance status (ECOG): 1 - Symptomatic but completely ambulatory Physical Exam Constitutional:      General: He is not in acute distress.    Appearance: Normal appearance. He is normal weight.  HENT:     Head: Normocephalic and atraumatic.  Eyes:     General: No scleral  icterus.    Extraocular Movements: Extraocular movements intact.     Conjunctiva/sclera: Conjunctivae normal.     Pupils: Pupils are equal, round, and reactive to light.  Cardiovascular:     Rate and Rhythm: Normal rate and regular rhythm.     Pulses: Normal pulses.     Heart sounds: Normal heart sounds. No murmur heard.    No friction rub. No gallop.  Pulmonary:     Effort: Pulmonary effort is normal. No respiratory distress.     Breath sounds: Normal breath sounds.  Abdominal:     General: Bowel sounds are normal. There is no distension.     Palpations: Abdomen is soft. There is no hepatomegaly, splenomegaly or mass.     Tenderness: There is no abdominal tenderness.  Musculoskeletal:        General: Normal range of motion.     Cervical back: Normal range of motion and neck supple.     Right lower leg: No edema.     Left lower leg: No edema.  Lymphadenopathy:     Cervical: No cervical adenopathy.  Skin:    General: Skin is warm and dry.  Neurological:     General: No focal deficit present.     Mental Status: He is alert and oriented to person, place, and time. Mental status is at baseline.  Psychiatric:        Mood and Affect: Mood normal.        Behavior: Behavior normal.  Thought Content: Thought content normal.        Judgment: Judgment normal.   SCANS:  CT scans of his abdomen/pelvis revealed the following:   FINDINGS:  CT CHEST FINDINGS  Cardiovascular: Accessed right chest wall Port-A-Cath with tip at  the superior cavoatrial junction. No thoracic aortic aneurysm or  dissection. No central pulmonary embolus on this nondedicated study.  Three-vessel coronary artery calcifications. Normal size heart. No  significant pericardial effusion/thickening.  Mediastinum/Nodes: No supraclavicular adenopathy. No discrete  thyroid nodularity. No pathologically enlarged mediastinal, hilar or  axillary lymph nodes. Stable gastric pull-through anatomy with  anastomosis at  the thoracic inlet.  Lungs/Pleura: Scattered small pulmonary nodules are unchanged for  instance in the right upper lobe on image 21/4, in the right lower  lobe on image 48/4, and in the left upper lobe on image 42/4 stable  over multiple priors consistent with benign etiology. No new  suspicious pulmonary nodules or masses. Left lower lobe calcified  granuloma. No pleural effusion. No pneumothorax.  Musculoskeletal: Thoracic spondylosis. Degenerative changes  bilateral shoulders. No aggressive lytic or blastic lesion of bone.  CT ABDOMEN FINDINGS Hepatobiliary: No suspicious hepatic lesion. Gallbladder is  unremarkable. No biliary ductal dilation.  Pancreas: No pancreatic ductal dilation or evidence of acute  inflammation.  Spleen: Within normal limits.  Adrenals/Urinary Tract: Bilateral adrenal glands are unremarkable.  No hydronephrosis. No solid enhancing renal mass.  Stomach/Bowel: Radiopaque enteric contrast material traverses the  hepatic flexure. Gastric pull-through anatomy appears stable from  prior. No pathologic dilation or evidence of acute inflammation  involving loops of large or small bowel in the abdomen. Moderate  colonic stool burden likely reflecting constipation.  Vascular/Lymphatic: No abdominal aortic aneurysm. No pathologically  enlarged abdominal lymph nodes.  Other: No significant abdominal free fluid. No discrete peritoneal  or omental nodularity. Laxity of the ventral abdominal wall with  prior ventral hernia repair.  Musculoskeletal: Mild spondylosis. No aggressive lytic or blastic  lesion of bone.  IMPRESSION:  1. Status post esophagectomy with gastric pull-through anatomy. No  evidence of recurrent or metastatic disease in the chest or abdomen.   ASSESSMENT & PLAN:  Assessment/Plan:  A 56 y.o. 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 In clinic today, I went over his CT scans with him, for which he could see he is  disease free.  Clinically, he is doing very well.  Moving forward, disease surveillance will take place.  I will see him back in 6 months for repeat clinical assessment.  CT scans will be done a day before his next visit to ensure he remains disease free.  The patient understands all the plans discussed today and is in agreement with them.     I, Rita Ohara, am acting as scribe for [name], MD    I have reviewed this report as typed by the medical scribe, and it is complete and accurate.  Mia Milan Macarthur Critchley, MD

## 2022-05-12 ENCOUNTER — Encounter: Payer: Self-pay | Admitting: Oncology

## 2022-11-02 NOTE — Progress Notes (Signed)
[facility]  [address] [place],  [place]  [address] [phone]  Clinic Day:  11/03/2022  Referring physician: Lillard Anes,*  HISTORY OF PRESENT ILLNESS:  The patient is a 56 y.o. 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  He completed neoadjuvant chemoradiation, which consisted of 3 cycles of FOLFOX chemotherapy.  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  Pathology from this surgery showed persistent adenocarcinoma which extended into the muscularis propria level of his stomach.  Based upon this, he underwent adjuvant nivolumab immunotherapy for 1 year, which was completed in November 2022.  Scans since then have shown no evidence of disease recurrence.  He comes in today for routine follow-up.  Since his last visit, the patient has been doing well.   He denies having any dysphagia, abdominal discomfort or other GI symptoms which concern him for disease recurrence.     PHYSICAL EXAM:  Blood pressure (!) 144/74, pulse 84, temperature 98.5 F (36.9 C), resp. rate 16, height '5\' 9"'$  (1.753 m), weight 191 lb 14.4 oz (87 kg), SpO2 97 %. Wt Readings from Last 3 Encounters:  11/03/22 191 lb 14.4 oz (87 kg)  05/05/22 189 lb 8 oz (86 kg)  02/13/22 190 lb (86.2 kg)   Body mass index is 28.34 kg/m. Performance status (ECOG): 1 - Symptomatic but completely ambulatory Physical Exam Constitutional:      General: He is not in acute distress.    Appearance: Normal appearance. He is normal weight.  HENT:     Head: Normocephalic and atraumatic.  Eyes:     General: No scleral icterus.    Extraocular Movements: Extraocular movements intact.     Conjunctiva/sclera: Conjunctivae normal.     Pupils: Pupils are equal, round, and reactive to light.  Cardiovascular:     Rate and Rhythm: Normal rate and regular rhythm.     Pulses: Normal pulses.     Heart sounds: Normal heart sounds. No  murmur heard.    No friction rub. No gallop.  Pulmonary:     Effort: Pulmonary effort is normal. No respiratory distress.     Breath sounds: Normal breath sounds.  Abdominal:     General: Bowel sounds are normal. There is no distension.     Palpations: Abdomen is soft. There is no hepatomegaly, splenomegaly or mass.     Tenderness: There is no abdominal tenderness.  Musculoskeletal:        General: Normal range of motion.     Cervical back: Normal range of motion and neck supple.     Right lower leg: No edema.     Left lower leg: No edema.  Lymphadenopathy:     Cervical: No cervical adenopathy.  Skin:    General: Skin is warm and dry.  Neurological:     General: No focal deficit present.     Mental Status: He is alert and oriented to person, place, and time. Mental status is at baseline.  Psychiatric:        Mood and Affect: Mood normal.        Behavior: Behavior normal.        Thought Content: Thought content normal.        Judgment: Judgment normal.   LABS:   Latest Reference Range & Units 11/03/22 14:34  Sodium 135 - 145 mmol/L 137  Potassium 3.5 - 5.1 mmol/L 4.2  Chloride 98 - 111 mmol/L 103  CO2  22 - 32 mmol/L 25  Glucose 70 - 99 mg/dL 93  BUN 6 - 20 mg/dL 24 (H)  Creatinine 0.61 - 1.24 mg/dL 0.74  Calcium 8.9 - 10.3 mg/dL 9.0  Anion gap 5 - 15  9  Alkaline Phosphatase 38 - 126 U/L 54  Albumin 3.5 - 5.0 g/dL 3.9  AST 15 - 41 U/L 17  ALT 0 - 44 U/L 15  Total Protein 6.5 - 8.1 g/dL 6.7  Total Bilirubin 0.3 - 1.2 mg/dL 0.5  GFR, Est Non African American >60 mL/min >60  (H): Data is abnormally high  ASSESSMENT & PLAN:  Assessment/Plan:  A 56 y.o. 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 Based upon his labs and physical exam today, the patient remains disease-free.  Clinically, he is doing very well.  I will see him back in 6 months for repeat clinical assessment.  CT scans will be done a day before his next visit for his  continued radiographic G-E junction cancer surveillance.  The patient understands all the plans discussed today and is in agreement with them.    Jozlyn Schatz Macarthur Critchley, MD

## 2022-11-03 ENCOUNTER — Telehealth: Payer: Self-pay | Admitting: Oncology

## 2022-11-03 ENCOUNTER — Other Ambulatory Visit: Payer: Self-pay | Admitting: Oncology

## 2022-11-03 ENCOUNTER — Inpatient Hospital Stay: Payer: Commercial Managed Care - HMO | Attending: Oncology | Admitting: Oncology

## 2022-11-03 ENCOUNTER — Other Ambulatory Visit: Payer: Commercial Managed Care - HMO

## 2022-11-03 ENCOUNTER — Ambulatory Visit: Payer: Commercial Managed Care - HMO | Admitting: Oncology

## 2022-11-03 ENCOUNTER — Inpatient Hospital Stay: Payer: Commercial Managed Care - HMO

## 2022-11-03 VITALS — BP 144/74 | HR 84 | Temp 98.5°F | Resp 16 | Ht 69.0 in | Wt 191.9 lb

## 2022-11-03 DIAGNOSIS — S31109D Unspecified open wound of abdominal wall, unspecified quadrant without penetration into peritoneal cavity, subsequent encounter: Secondary | ICD-10-CM

## 2022-11-03 DIAGNOSIS — C159 Malignant neoplasm of esophagus, unspecified: Secondary | ICD-10-CM

## 2022-11-03 DIAGNOSIS — Z8501 Personal history of malignant neoplasm of esophagus: Secondary | ICD-10-CM | POA: Diagnosis not present

## 2022-11-03 LAB — CMP (CANCER CENTER ONLY)
ALT: 15 U/L (ref 0–44)
AST: 17 U/L (ref 15–41)
Albumin: 3.9 g/dL (ref 3.5–5.0)
Alkaline Phosphatase: 54 U/L (ref 38–126)
Anion gap: 9 (ref 5–15)
BUN: 24 mg/dL — ABNORMAL HIGH (ref 6–20)
CO2: 25 mmol/L (ref 22–32)
Calcium: 9 mg/dL (ref 8.9–10.3)
Chloride: 103 mmol/L (ref 98–111)
Creatinine: 0.74 mg/dL (ref 0.61–1.24)
GFR, Estimated: >60 mL/min (ref >60)
Glucose, Bld: 93 mg/dL (ref 70–99)
Potassium: 4.2 mmol/L (ref 3.5–5.1)
Sodium: 137 mmol/L (ref 135–145)
Total Bilirubin: 0.5 mg/dL (ref 0.3–1.2)
Total Protein: 6.7 g/dL (ref 6.5–8.1)

## 2022-11-03 LAB — CBC: RBC: 4.93 (ref 3.87–5.11)

## 2022-11-03 LAB — CBC AND DIFFERENTIAL
HCT: 45 (ref 41–53)
Hemoglobin: 15.6 (ref 13.5–17.5)
Neutrophils Absolute: 4.51
Platelets: 205 10*3/uL (ref 150–400)
WBC: 7.4

## 2022-11-03 NOTE — Telephone Encounter (Signed)
Patient has been scheduled for follow-up visit per 11/03/22 los. Pt given an appt calendar with date and time.

## 2023-01-08 ENCOUNTER — Telehealth: Payer: Self-pay

## 2023-01-08 NOTE — Telephone Encounter (Signed)
I called the patient today to see about getting an appointment scheduled for a cpe. Looks like this patient is currently overdue for an appointment. The patient stated that is not necessary and that he is doing well.

## 2023-04-13 ENCOUNTER — Telehealth: Payer: Self-pay | Admitting: Oncology

## 2023-04-13 NOTE — Telephone Encounter (Signed)
CT C/A/P has been scheduled for 05/07/23 @ 3pm; Checking @ 2 pm   LVM notifying pt of date,time and instructions.

## 2023-05-07 DIAGNOSIS — K5730 Diverticulosis of large intestine without perforation or abscess without bleeding: Secondary | ICD-10-CM | POA: Diagnosis not present

## 2023-05-07 DIAGNOSIS — R918 Other nonspecific abnormal finding of lung field: Secondary | ICD-10-CM | POA: Diagnosis not present

## 2023-05-07 DIAGNOSIS — C159 Malignant neoplasm of esophagus, unspecified: Secondary | ICD-10-CM | POA: Diagnosis not present

## 2023-05-07 LAB — CBC AND DIFFERENTIAL
HCT: 48 (ref 41–53)
Hemoglobin: 16.8 (ref 13.5–17.5)
Neutrophils Absolute: 4.41
Platelets: 211 10*3/uL (ref 150–400)
WBC: 7

## 2023-05-07 LAB — BASIC METABOLIC PANEL
BUN: 27 — AB (ref 4–21)
CO2: 27 — AB (ref 13–22)
Chloride: 104 (ref 99–108)
Creatinine: 0.8 (ref 0.6–1.3)
EGFR (Non-African Amer.): >60
Glucose: 101
Potassium: 4.8 mEq/L (ref 3.5–5.1)
Sodium: 138 (ref 137–147)

## 2023-05-07 LAB — COMPREHENSIVE METABOLIC PANEL
Albumin: 4.2 (ref 3.5–5.0)
Calcium: 9 (ref 8.7–10.7)

## 2023-05-07 LAB — HEPATIC FUNCTION PANEL
ALT: 25 U/L (ref 10–40)
AST: 29 (ref 14–40)
Alkaline Phosphatase: 72 (ref 25–125)
Bilirubin, Total: 0.7

## 2023-05-07 LAB — CBC: RBC: 5.06 (ref 3.87–5.11)

## 2023-05-07 NOTE — Progress Notes (Signed)
[facility]  [address] [place],  [place]  [address] [phone]  Clinic Day:  05/08/2023  Referring physician: Abigail Gonzalez,*  HISTORY OF PRESENT ILLNESS:  The patient is a 57 y.o. 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  He completed neoadjuvant chemoradiation, which consisted of 3 cycles of FOLFOX chemotherapy.  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  Pathology from this surgery showed persistent adenocarcinoma which extended into the muscularis propria level of his stomach.  Based upon this, he underwent adjuvant nivolumab immunotherapy for 1 year, which was completed in November 2022.  Scans since then have shown no evidence of disease recurrence.  He comes in today for routine follow-up, as well as to review him most recent CT scans.  Since his last visit, the patient has been doing well.   He denies having any dysphagia, abdominal discomfort or other GI symptoms which concern him for disease recurrence.     PHYSICAL EXAM:  Blood pressure (!) 144/93, pulse 76, temperature 98.8 F (37.1 C), resp. rate 16, height 5\' 9"  (1.753 m), weight 199 lb 3.2 oz (90.4 kg), SpO2 96 %. Wt Readings from Last 3 Encounters:  05/08/23 199 lb 3.2 oz (90.4 kg)  11/03/22 191 lb 14.4 oz (87 kg)  05/05/22 189 lb 8 oz (86 kg)   Body mass index is 29.42 kg/m. Performance status (ECOG): 1 - Symptomatic but completely ambulatory Physical Exam Constitutional:      General: He is not in acute distress.    Appearance: Normal appearance. He is normal weight.  HENT:     Head: Normocephalic and atraumatic.  Eyes:     General: No scleral icterus.    Extraocular Movements: Extraocular movements intact.     Conjunctiva/sclera: Conjunctivae normal.     Pupils: Pupils are equal, round, and reactive to light.  Cardiovascular:     Rate and Rhythm: Normal rate and regular rhythm.     Pulses:  Normal pulses.     Heart sounds: Normal heart sounds. No murmur heard.    No friction rub. No gallop.  Pulmonary:     Effort: Pulmonary effort is normal. No respiratory distress.     Breath sounds: Normal breath sounds.  Abdominal:     General: Bowel sounds are normal. There is no distension.     Palpations: Abdomen is soft. There is no hepatomegaly, splenomegaly or mass.     Tenderness: There is no abdominal tenderness.  Musculoskeletal:        General: Normal range of motion.     Cervical back: Normal range of motion and neck supple.     Right lower leg: No edema.     Left lower leg: No edema.  Lymphadenopathy:     Cervical: No cervical adenopathy.  Skin:    General: Skin is warm and dry.  Neurological:     General: No focal deficit present.     Mental Status: He is alert and oriented to person, place, and time. Mental status is at baseline.  Psychiatric:        Mood and Affect: Mood normal.        Behavior: Behavior normal.        Thought Content: Thought content normal.        Judgment: Judgment normal.   LABS:    ASSESSMENT & PLAN:  Assessment/Plan:  A 57 y.o. male with stage IVA (T3 N2 M0) adenocarcinoma  of his gastroesophageal junction. In clinic today, I went over his CT scan images with him, for which he could see that he radiographically remains disease free.  Clinically, he is doing very well.  As that is the case, I will see him back in 6 months for repeat clinical assessment.  The patient understands all the plans discussed today and is in agreement with them.    Thomas Gonzalez Kirby Funk, MD

## 2023-05-08 ENCOUNTER — Other Ambulatory Visit: Payer: Self-pay | Admitting: Oncology

## 2023-05-08 ENCOUNTER — Inpatient Hospital Stay: Payer: Medicare HMO | Attending: Oncology | Admitting: Oncology

## 2023-05-08 VITALS — BP 144/93 | HR 76 | Temp 98.8°F | Resp 16 | Ht 69.0 in | Wt 199.2 lb

## 2023-05-08 DIAGNOSIS — C159 Malignant neoplasm of esophagus, unspecified: Secondary | ICD-10-CM

## 2023-05-09 ENCOUNTER — Telehealth: Payer: Self-pay | Admitting: Oncology

## 2023-05-09 NOTE — Telephone Encounter (Signed)
Patient has been scheduled. Aware of appt date and time   Scheduling Message Entered by Rennis Harding A on 05/08/2023 at  3:24 PM Priority: Routine <No visit type provided>  Department: [facility]  Provider:  Scheduling Notes:  Labs/appt on 11-07-23

## 2023-05-15 ENCOUNTER — Encounter: Payer: Self-pay | Admitting: Oncology

## 2023-06-07 ENCOUNTER — Telehealth: Payer: Self-pay

## 2023-06-07 NOTE — Telephone Encounter (Signed)
for 2 months now the patient has had body shakes. symptoms are stable. symptoms occur once a week. I spoke with Leotis Shames, MA and notified her of what the patient stated and told her we have no openings between today or tomorrow. She stated that it is ok for the patient to wait until next week. The call was dropped with the patient, I tired to call the patient back but was unable to speak with him. I left a message stating that we can get him an appointment for Monday and that we do not have any openings between today or tomorrow. I asked that the patient call the office back to see about getting an appointment scheduled.

## 2023-06-11 ENCOUNTER — Ambulatory Visit: Payer: Medicare HMO | Admitting: Physician Assistant

## 2023-06-11 ENCOUNTER — Encounter: Payer: Self-pay | Admitting: Physician Assistant

## 2023-06-11 VITALS — BP 110/72 | HR 85 | Temp 97.9°F | Ht 69.0 in | Wt 202.4 lb

## 2023-06-11 DIAGNOSIS — E162 Hypoglycemia, unspecified: Secondary | ICD-10-CM | POA: Diagnosis not present

## 2023-06-11 DIAGNOSIS — N486 Induration penis plastica: Secondary | ICD-10-CM | POA: Diagnosis not present

## 2023-06-11 NOTE — Assessment & Plan Note (Signed)
Referral sent to Urology

## 2023-06-11 NOTE — Assessment & Plan Note (Signed)
Future dated labs Will send for U/S pancrease if blood work is negative

## 2023-06-11 NOTE — Progress Notes (Signed)
Acute Office Visit  Subjective:    Patient ID: Thomas Gonzalez, male    DOB: [birth year], 57 y.o.   MRN: [id number]  Chief Complaint  Patient presents with   Shaking    HPI: Patient is in today for concern of his blood sugar may be dropping. Patient stated a week ago he was out in the pool swimming and he got shaky, check his blood sugar with his wife glucose monitor and it was 74, stated after drinking orange juice his sugar went up and he felt better. States that he has also been adding a small glass of orange juice to his breakfast in the morning and states that has helped him feel a lot better also. Denies any major family history of diabetes, or any history of pancreatic cancer. Had a PET scan last month and it was normal.   Last winter patient noticed that if he got an erection his penis would be curved to one side. States it is a little painful when erect, but denies any issues when he is not erect. States he has never had this issue before. Does not cause any issues with urination. Denies history of STD/STI   Past Medical History:  Diagnosis Date   Bowel obstruction (HCC)    06/21/2017 reported- "years ago"   Cancer (HCC)    basal cell face   GERD (gastroesophageal reflux disease)    History of kidney stones    x 2    Past Surgical History:  Procedure Laterality Date   APPENDECTOMY  2019   APPLICATION OF A-CELL OF EXTREMITY N/A 01/27/2021   Procedure: APPLICATION OF MYRIAD OR MatriDerm;  Surgeon: Peggye Form, DO;  Location: [facility];  Service: Plastics;  Laterality: N/A;   COMPLETE ESOPHAGECTOMY N/A 09/20/2020   Procedure: Transhiatal total ESOPHAGECTOMY with Pyloroplasty;  Surgeon: Delight Ovens, MD;  Location: [facility] OR;  Service: Thoracic;  Laterality: N/A;   HERNIA REPAIR Bilateral 2007,2010,2020   Inguinial    INCISION AND DRAINAGE OF WOUND N/A 01/27/2021   Procedure: Debridement of abdominal wound;  Surgeon: Peggye Form, DO;   Location: [facility];  Service: Plastics;  Laterality: N/A;  45 min   INCISIONAL HERNIA REPAIR N/A 02/13/2022   Procedure: OPEN INCISONAL HERNIA REPAIR WITH MESH;  Surgeon: Dossie Der, Hyman Hopes, MD;  Location: [facility];  Service: General;  Laterality: N/A;   JEJUNOSTOMY N/A 09/20/2020   Procedure: Rance Muir;  Surgeon: Delight Ovens, MD;  Location: [facility] OR;  Service: Thoracic;  Laterality: N/A;   KNEE ARTHROSCOPY Left 06/27/2017   Procedure: ARTHROSCOPY KNEE;  Surgeon: Jodi Geralds, MD;  Location: [facility] OR;  Service: Orthopedics;  Laterality: Left;   REMOVAL OF GASTROSTOMY TUBE  09/20/2020   Procedure: REMOVAL OF GASTROSTOMY TUBE;  Surgeon: Delight Ovens, MD;  Location: [facility] OR;  Service: Thoracic;;   VIDEO BRONCHOSCOPY N/A 09/20/2020   Procedure: VIDEO BRONCHOSCOPY;  Surgeon: Delight Ovens, MD;  Location: [facility] OR;  Service: Thoracic;  Laterality: N/A;    Family History  Family history unknown: Yes    Social History   Socioeconomic History   Marital status: Married    Spouse name: Not on file   Number of children: 2   Years of education: Not on file   Highest education level: Not on file  Occupational History    Employer: [employer]  Tobacco Use   Smoking status: Former    Types: Cigarettes   Smokeless tobacco: Former  Tobacco comments:    quit in 1985  Vaping Use   Vaping status: Never Used  Substance and Sexual Activity   Alcohol use: No    Comment: in high school- not since   Drug use: Never   Sexual activity: Yes  Other Topics Concern   Not on file  Social History Narrative   Not on file   Social Determinants of Health   Financial Resource Strain: Low Risk  (06/11/2023)   Overall Financial Resource Strain (CARDIA)    Difficulty of Paying Living Expenses: Not hard at all  Food Insecurity: No Food Insecurity (06/11/2023)   Hunger Vital Sign    Worried About Running Out of Food in the Last Year: Never true    Ran Out of Food in the Last  Year: Never true  Transportation Needs: No Transportation Needs (06/11/2023)   PRAPARE - Administrator, Civil Service (Medical): No    Lack of Transportation (Non-Medical): No  Physical Activity: Sufficiently Active (06/11/2023)   Exercise Vital Sign    Days of Exercise per Week: 5 days    Minutes of Exercise per Session: 30 min  Stress: No Stress Concern Present (06/11/2023)   [employer] of Occupational Health - Occupational Stress Questionnaire    Feeling of Stress : Not at all  Social Connections: Moderately Isolated (06/11/2023)   Social Connection and Isolation Panel [NHANES]    Frequency of Communication with Friends and Family: More than three times a week    Frequency of Social Gatherings with Friends and Family: More than three times a week    Attends Religious Services: Never    Database administrator or Organizations: No    Attends Banker Meetings: Never    Marital Status: Married  Catering manager Violence: Not At Risk (06/11/2023)   Humiliation, Afraid, Rape, and Kick questionnaire    Fear of Current or Ex-Partner: No    Emotionally Abused: No    Physically Abused: No    Sexually Abused: No    Outpatient Medications Prior to Visit  Medication Sig Dispense Refill   acetaminophen (TYLENOL) 325 MG tablet Take 2 tablets (650 mg total) by mouth every 6 (six) hours as needed. (Patient taking differently: Take 650 mg by mouth every 6 (six) hours as needed for mild pain, moderate pain or headache.)     ascorbic acid (VITAMIN C) 500 MG tablet Take 500 mg by mouth daily.     cholecalciferol (VITAMIN D3) 25 MCG (1000 UNIT) tablet Take 1,000 Units by mouth daily.     zinc gluconate 50 MG tablet Take 50 mg by mouth daily.     No facility-administered medications prior to visit.    Allergies  Allergen Reactions   Penicillins Anaphylaxis   Prednisone Anxiety    Mood swings    Review of Systems  Constitutional:  Negative for fatigue.  HENT:   Negative for congestion, ear pain and sore throat.   Respiratory:  Negative for cough and shortness of breath.   Cardiovascular:  Negative for chest pain.  Gastrointestinal:  Negative for abdominal pain, constipation, diarrhea, nausea and vomiting.  Genitourinary:  Negative for dysuria, frequency and urgency.  Musculoskeletal:  Negative for arthralgias, back pain and myalgias.  Neurological:  Negative for dizziness and headaches.  Psychiatric/Behavioral:  Negative for agitation and sleep disturbance. The patient is not nervous/anxious.        Objective:        06/11/2023    1:51 PM  05/08/2023    3:16 PM 11/03/2022    3:02 PM  Vitals with BMI  Height 5\' 9"  5\' 9"  5\' 9"   Weight 202 lbs 6 oz 199 lbs 3 oz 191 lbs 14 oz  BMI 29.88 29.4 28.33  Systolic 110 144 401  Diastolic 72 93 74  Pulse 85 76 84    Orthostatic VS for the past 72 hrs (Last 3 readings):  Patient Position BP Location Cuff Size  06/11/23 1351 Sitting Left Arm Large     Physical Exam Vitals reviewed.  Constitutional:      Appearance: Normal appearance.  Cardiovascular:     Rate and Rhythm: Normal rate and regular rhythm.     Heart sounds: Normal heart sounds.  Pulmonary:     Effort: Pulmonary effort is normal.     Breath sounds: Normal breath sounds.  Abdominal:     General: Bowel sounds are normal.     Palpations: Abdomen is soft.     Tenderness: There is no abdominal tenderness.  Neurological:     Mental Status: He is alert and oriented to person, place, and time.  Psychiatric:        Mood and Affect: Mood normal.        Behavior: Behavior normal.     Health Maintenance Due  Topic Date Due   Medicare Annual Wellness (AWV)  Never done   COVID-19 Vaccine (1) Never done   HIV Screening  Never done   Hepatitis C Screening  Never done   DTaP/Tdap/Td (1 - Tdap) Never done   Zoster Vaccines- Shingrix (1 of 2) Never done   Colonoscopy  Never done    There are no preventive care reminders to display  for this patient.   Lab Results  Component Value Date   TSH 0.632 09/20/2021   Lab Results  Component Value Date   WBC 7.0 05/07/2023   HGB 16.8 05/07/2023   HCT 48 05/07/2023   MCV 94.7 02/14/2022   PLT 211 05/07/2023   Lab Results  Component Value Date   NA 138 05/07/2023   K 4.8 05/07/2023   CO2 27 (A) 05/07/2023   GLUCOSE 93 11/03/2022   BUN 27 (A) 05/07/2023   CREATININE 0.8 05/07/2023   BILITOT 0.5 11/03/2022   ALKPHOS 72 05/07/2023   AST 29 05/07/2023   ALT 25 05/07/2023   PROT 6.7 11/03/2022   ALBUMIN 4.2 05/07/2023   CALCIUM 9.0 05/07/2023   ANIONGAP 9 11/03/2022   No results found for: "CHOL" No results found for: "HDL" No results found for: "LDLCALC" No results found for: "TRIG" No results found for: "CHOLHDL" No results found for: "HGBA1C"     Assessment & Plan:  Hypoglycemia Assessment & Plan: Future dated labs Will send for U/S pancrease if blood work is negative  Orders: -     CBC with Differential/Platelet; Future -     Comprehensive metabolic panel; Future -     Hemoglobin A1c; Future -     Lipid panel; Future -     T4, free; Future -     TSH; Future -     Microalbumin / creatinine urine ratio; Future  Peyronie's syndrome Assessment & Plan: Referral sent to Urology.   Orders: -     Ambulatory referral to Urology     No orders of the defined types were placed in this encounter.   Orders Placed This Encounter  Procedures   CBC with Differential/Platelet   Comprehensive metabolic panel  Hemoglobin A1c   Lipid panel   T4, free   TSH   Microalbumin / creatinine urine ratio   Ambulatory referral to Urology     Follow-up: Return if symptoms worsen or fail to improve, for nurse visit.  An After Visit Summary was printed and given to the patient.  Langley Gauss, [place] [facility] [phone]

## 2023-06-12 ENCOUNTER — Other Ambulatory Visit: Payer: Medicare HMO

## 2023-06-12 DIAGNOSIS — E162 Hypoglycemia, unspecified: Secondary | ICD-10-CM

## 2023-06-21 ENCOUNTER — Ambulatory Visit: Payer: Medicare HMO | Admitting: Urology

## 2023-06-21 ENCOUNTER — Encounter: Payer: Self-pay | Admitting: Urology

## 2023-06-21 VITALS — BP 124/83 | HR 89 | Ht 69.0 in | Wt 200.0 lb

## 2023-06-21 DIAGNOSIS — N486 Induration penis plastica: Secondary | ICD-10-CM

## 2023-06-21 NOTE — Progress Notes (Signed)
Assessment: 1. Peyronie's disease     Plan: I personally reviewed the patient's chart including provider notes. Diagnosis and management of Peyronie's disease discussed with the patient and his wife today.  I discussed the natural history of Peyronie's.  I also discussed treatment options including Xiaflex and surgical management.  I advised him that treatment is recommended for patients with penile curvature making sexual intercourse difficult or affecting erections. I provided information to the patient regarding Peyronie's disease. At the present time, he would like to monitor his symptoms. Return to office prn  Chief Complaint:  Chief Complaint  Patient presents with   Abnormal Penile Curvature    History of Present Illness:  Thomas Gonzalez is a 57 y.o. male who is seen in consultation from [place], [place] for evaluation of penile curvature.  He noted onset of a penile curvature approximately 6 months ago.  No known history of penile trauma.  He reports a curvature to the right.  He reports this is around 30 degrees.  No pain with curvature.  He does not have any problems with erections.  He is able to achieve an adequate erection for intercourse.  No problems with intercourse secondary to the curvature.  No lower urinary tract symptoms.    Past Medical History:  Past Medical History:  Diagnosis Date   Bowel obstruction (HCC)    06/21/2017 reported- "years ago"   Cancer (HCC)    basal cell face   GERD (gastroesophageal reflux disease)    History of kidney stones    x 2    Past Surgical History:  Past Surgical History:  Procedure Laterality Date   APPENDECTOMY  2019   APPLICATION OF A-CELL OF EXTREMITY N/A 01/27/2021   Procedure: APPLICATION OF MYRIAD OR MatriDerm;  Surgeon: Peggye Form, DO;  Location: [facility];  Service: Plastics;  Laterality: N/A;   COMPLETE ESOPHAGECTOMY N/A 09/20/2020   Procedure: Transhiatal total ESOPHAGECTOMY with  Pyloroplasty;  Surgeon: Delight Ovens, MD;  Location: [facility] OR;  Service: Thoracic;  Laterality: N/A;   HERNIA REPAIR Bilateral 2007,2010,2020   Inguinial    INCISION AND DRAINAGE OF WOUND N/A 01/27/2021   Procedure: Debridement of abdominal wound;  Surgeon: Peggye Form, DO;  Location: [facility];  Service: Plastics;  Laterality: N/A;  45 min   INCISIONAL HERNIA REPAIR N/A 02/13/2022   Procedure: OPEN INCISONAL HERNIA REPAIR WITH MESH;  Surgeon: Dossie Der, Hyman Hopes, MD;  Location: [facility];  Service: General;  Laterality: N/A;   JEJUNOSTOMY N/A 09/20/2020   Procedure: Rance Muir;  Surgeon: Delight Ovens, MD;  Location[facility] OR;  Service: Thoracic;  Laterality: N/A;   KNEE ARTHROSCOPY Left 06/27/2017   Procedure: ARTHROSCOPY KNEE;  Surgeon: Jodi Geralds, MD;  Location: [facility] OR;  Service: Orthopedics;  Laterality: Left;   REMOVAL OF GASTROSTOMY TUBE  09/20/2020   Procedure: REMOVAL OF GASTROSTOMY TUBE;  Surgeon: Delight Ovens, MD;  Location: [facility] OR;  Service: Thoracic;;   VIDEO BRONCHOSCOPY N/A 09/20/2020   Procedure: VIDEO BRONCHOSCOPY;  Surgeon: Delight Ovens, MD;  Location: [facility] OR;  Service: Thoracic;  Laterality: N/A;    Allergies:  Allergies  Allergen Reactions   Penicillins Anaphylaxis   Prednisone Anxiety    Mood swings    Family History:  Family History  Family history unknown: Yes    Social History:  Social History   Tobacco Use   Smoking status: Former    Types: Cigarettes   Smokeless tobacco:  Former   Tobacco comments:    quit in 1985  Vaping Use   Vaping status: Never Used  Substance Use Topics   Alcohol use: No    Comment: in high school- not since   Drug use: Never    Review of symptoms:  Constitutional:  Negative for unexplained weight loss, night sweats, fever, chills ENT:  Negative for nose bleeds, sinus pain, painful swallowing CV:  Negative for chest pain, shortness of breath, exercise intolerance, palpitations, loss of  consciousness Resp:  Negative for cough, wheezing, shortness of breath GI:  Negative for nausea, vomiting, diarrhea, bloody stools GU:  Positives noted in HPI; otherwise negative for gross hematuria, dysuria, urinary incontinence Neuro:  Negative for seizures, poor balance, limb weakness, slurred speech Psych:  Negative for lack of energy, depression, anxiety Endocrine:  Negative for polydipsia, polyuria, symptoms of hypoglycemia (dizziness, hunger, sweating) Hematologic:  Negative for anemia, purpura, petechia, prolonged or excessive bleeding, use of anticoagulants  Allergic:  Negative for difficulty breathing or choking as a result of exposure to anything; no shellfish allergy; no allergic response (rash/itch) to materials, foods  Physical exam: BP 124/83   Pulse 89   Ht 5\' 9"  (1.753 m)   Wt 200 lb (90.7 kg)   BMI 29.53 kg/m  GENERAL APPEARANCE:  Well appearing, well developed, well nourished, NAD HEENT:  Atraumatic, normocephalic, oropharynx clear NECK:  Supple without lymphadenopathy or thyromegaly ABDOMEN:  Soft, non-tender, no masses EXTREMITIES:  Moves all extremities well, without clubbing, cyanosis, or edema NEUROLOGIC:  Alert and oriented x 3, normal gait, CN II-XII grossly intact MENTAL STATUS:  appropriate BACK:  Non-tender to palpation, No CVAT SKIN:  Warm, dry, and intact GU: Penis:  circumcised; plaque palpated on mid dorsal shaft extending proximally; small plaque mid shaft on right Meatus: Normal Scrotum: normal, no masses Testis: normal without masses bilateral  Results: None

## 2023-07-05 DIAGNOSIS — H524 Presbyopia: Secondary | ICD-10-CM | POA: Diagnosis not present

## 2023-07-05 DIAGNOSIS — E119 Type 2 diabetes mellitus without complications: Secondary | ICD-10-CM | POA: Diagnosis not present

## 2023-07-05 DIAGNOSIS — Z0100 Encounter for examination of eyes and vision without abnormal findings: Secondary | ICD-10-CM | POA: Diagnosis not present

## 2023-09-04 ENCOUNTER — Ambulatory Visit: Payer: Medicare HMO | Admitting: Physician Assistant

## 2023-09-04 ENCOUNTER — Encounter: Payer: Self-pay | Admitting: Physician Assistant

## 2023-09-04 VITALS — BP 110/80 | HR 74 | Temp 97.8°F | Resp 12 | Ht 69.0 in | Wt 205.0 lb

## 2023-09-04 DIAGNOSIS — H6121 Impacted cerumen, right ear: Secondary | ICD-10-CM | POA: Diagnosis not present

## 2023-09-04 NOTE — Progress Notes (Addendum)
Acute Office Visit  Subjective:    Patient ID: Thomas Gonzalez, male    DOB: July 28, 1966, 57 y.o.   MRN: [id number]  Chief Complaint  Patient presents with   Tinnitus    HPI: Patient is in today for right ear is stopped up. He mentioned tinnitus, and hearing loss since one week ago. He denies headache, sore throat, nasal congestion, fever, chills, or any discharge from the right ear. Noticed that sometimes his wife will talk to him on his right side and he wont hear anything. Also plugged his left ear and said that he the couldn't hear anything from the right side. Has a device he uses to try and clean his ears and has been doing that for awhile and hasn't noticed an issue with it before.   Past Medical History:  Diagnosis Date   Bowel obstruction (HCC)    06/21/2017 reported- "years ago"   Cancer (HCC)    basal cell face   GERD (gastroesophageal reflux disease)    History of kidney stones    x 2    Past Surgical History:  Procedure Laterality Date   APPENDECTOMY  2019   APPLICATION OF A-CELL OF EXTREMITY N/A 01/27/2021   Procedure: APPLICATION OF MYRIAD OR MatriDerm;  Surgeon: Peggye Form, DO;  Location: [facility];  Service: Plastics;  Laterality: N/A;   COMPLETE ESOPHAGECTOMY N/A 09/20/2020   Procedure: Transhiatal total ESOPHAGECTOMY with Pyloroplasty;  Surgeon: Delight Ovens, MD;  Location: [facility] OR;  Service: Thoracic;  Laterality: N/A;   HERNIA REPAIR Bilateral 2007,2010,2020   Inguinial    INCISION AND DRAINAGE OF WOUND N/A 01/27/2021   Procedure: Debridement of abdominal wound;  Surgeon: Peggye Form, DO;  Location: [facility];  Service: Plastics;  Laterality: N/A;  45 min   INCISIONAL HERNIA REPAIR N/A 02/13/2022   Procedure: OPEN INCISONAL HERNIA REPAIR WITH MESH;  Surgeon: Dossie Der, Hyman Hopes, MD;  Location: [facility];  Service: General;  Laterality: N/A;   JEJUNOSTOMY N/A 09/20/2020   Procedure: Rance Muir;  Surgeon:  Delight Ovens, MD;  Location[facility] OR;  Service: Thoracic;  Laterality: N/A;   KNEE ARTHROSCOPY Left 06/27/2017   Procedure: ARTHROSCOPY KNEE;  Surgeon: Jodi Geralds, MD;  Location: [facility] OR;  Service: Orthopedics;  Laterality: Left;   REMOVAL OF GASTROSTOMY TUBE  09/20/2020   Procedure: REMOVAL OF GASTROSTOMY TUBE;  Surgeon: Delight Ovens, MD;  Location: [facility] OR;  Service: Thoracic;;   VIDEO BRONCHOSCOPY N/A 09/20/2020   Procedure: VIDEO BRONCHOSCOPY;  Surgeon: Delight Ovens, MD;  Location: [facility] OR;  Service: Thoracic;  Laterality: N/A;    Family History  Family history unknown: Yes    Social History   Socioeconomic History   Marital status: Married    Spouse name: Not on file   Number of children: 2   Years of education: Not on file   Highest education level: Not on file  Occupational History    Employer: [employer]  Tobacco Use   Smoking status: Former    Types: Cigarettes   Smokeless tobacco: Former   Tobacco comments:    quit in 1985  Vaping Use   Vaping status: Never Used  Substance and Sexual Activity   Alcohol use: No    Comment: in high school- not since   Drug use: Never   Sexual activity: Yes  Other Topics Concern   Not on file  Social History Narrative   Not on file  Social Determinants of Health   Financial Resource Strain: Low Risk  (06/11/2023)   Overall Financial Resource Strain (CARDIA)    Difficulty of Paying Living Expenses: Not hard at all  Food Insecurity: No Food Insecurity (06/11/2023)   Hunger Vital Sign    Worried About Running Out of Food in the Last Year: Never true    Ran Out of Food in the Last Year: Never true  Transportation Needs: No Transportation Needs (06/11/2023)   PRAPARE - Administrator, Civil Service (Medical): No    Lack of Transportation (Non-Medical): No  Physical Activity: Sufficiently Active (06/11/2023)   Exercise Vital Sign    Days of Exercise per Week: 5 days    Minutes of Exercise per  Session: 30 min  Stress: No Stress Concern Present (06/11/2023)   [employer] of Occupational Health - Occupational Stress Questionnaire    Feeling of Stress : Not at all  Social Connections: Moderately Isolated (06/11/2023)   Social Connection and Isolation Panel [NHANES]    Frequency of Communication with Friends and Family: More than three times a week    Frequency of Social Gatherings with Friends and Family: More than three times a week    Attends Religious Services: Never    Database administrator or Organizations: No    Attends Banker Meetings: Never    Marital Status: Married  Catering manager Violence: Not At Risk (06/11/2023)   Humiliation, Afraid, Rape, and Kick questionnaire    Fear of Current or Ex-Partner: No    Emotionally Abused: No    Physically Abused: No    Sexually Abused: No    Outpatient Medications Prior to Visit  Medication Sig Dispense Refill   acetaminophen (TYLENOL) 325 MG tablet Take 2 tablets (650 mg total) by mouth every 6 (six) hours as needed. (Patient taking differently: Take 650 mg by mouth every 6 (six) hours as needed for mild pain (pain score 1-3), moderate pain (pain score 4-6) or headache.)     ascorbic acid (VITAMIN C) 500 MG tablet Take 500 mg by mouth daily.     cholecalciferol (VITAMIN D3) 25 MCG (1000 UNIT) tablet Take 1,000 Units by mouth daily.     zinc gluconate 50 MG tablet Take 50 mg by mouth daily.     No facility-administered medications prior to visit.    Allergies  Allergen Reactions   Penicillins Anaphylaxis   Prednisone Anxiety    Mood swings    Review of Systems  Constitutional:  Negative for chills, fatigue and fever.  HENT:  Positive for hearing loss and tinnitus. Negative for congestion, ear pain, sinus pain and sore throat.   Respiratory:  Negative for cough and shortness of breath.   Cardiovascular:  Negative for chest pain.  Musculoskeletal:  Negative for myalgias.  Neurological:  Negative for  headaches.       Objective:        09/04/2023   11:00 AM 06/21/2023    1:47 PM 06/11/2023    1:51 PM  Vitals with BMI  Height 5\' 9"  5\' 9"  5\' 9"   Weight 205 lbs 200 lbs 202 lbs 6 oz  BMI 30.26 29.52 29.88  Systolic 110 124 409  Diastolic 80 83 72  Pulse 74 89 85    No data found.   Physical Exam Vitals reviewed.  Constitutional:      Appearance: Normal appearance.  HENT:     Right Ear: Decreased hearing noted. Tenderness present. There is impacted  cerumen.     Left Ear: Hearing, tympanic membrane and ear canal normal.  Neck:     Vascular: No carotid bruit.  Cardiovascular:     Rate and Rhythm: Normal rate and regular rhythm.     Heart sounds: Normal heart sounds.  Pulmonary:     Effort: Pulmonary effort is normal.     Breath sounds: Normal breath sounds.  Abdominal:     General: Bowel sounds are normal.     Palpations: Abdomen is soft.     Tenderness: There is no abdominal tenderness.  Neurological:     Mental Status: He is alert and oriented to person, place, and time.  Psychiatric:        Mood and Affect: Mood normal.        Behavior: Behavior normal.     Health Maintenance Due  Topic Date Due   Medicare Annual Wellness (AWV)  Never done   COVID-19 Vaccine (1) Never done   HIV Screening  Never done   Hepatitis C Screening  Never done   DTaP/Tdap/Td (1 - Tdap) Never done   Zoster Vaccines- Shingrix (1 of 2) Never done   Colonoscopy  Never done   INFLUENZA VACCINE  Never done    There are no preventive care reminders to display for this patient.   Lab Results  Component Value Date   TSH 1.080 06/12/2023   Lab Results  Component Value Date   WBC 5.7 06/12/2023   HGB 16.9 06/12/2023   HCT 50.5 06/12/2023   MCV 98 (H) 06/12/2023   PLT 212 06/12/2023   Lab Results  Component Value Date   NA 144 06/12/2023   K 5.4 (H) 06/12/2023   CO2 26 06/12/2023   GLUCOSE 91 06/12/2023   BUN 22 06/12/2023   CREATININE 0.85 06/12/2023   BILITOT 0.5  06/12/2023   ALKPHOS 73 06/12/2023   AST 13 06/12/2023   ALT 22 06/12/2023   PROT 6.6 06/12/2023   ALBUMIN 4.2 06/12/2023   CALCIUM 9.6 06/12/2023   ANIONGAP 9 11/03/2022   EGFR 100 06/12/2023   Lab Results  Component Value Date   CHOL 171 06/12/2023   Lab Results  Component Value Date   HDL 57 06/12/2023   Lab Results  Component Value Date   LDLCALC 103 (H) 06/12/2023   Lab Results  Component Value Date   TRIG 57 06/12/2023   Lab Results  Component Value Date   CHOLHDL 3.0 06/12/2023   Lab Results  Component Value Date   HGBA1C 5.7 (H) 06/12/2023   Ear Cerumen Removal  Date/Time: 09/04/2023 11:30 AM  Performed by: Langley Gauss, PA Authorized by: Langley Gauss, PA   Anesthesia: Local Anesthetic: none Ceruminolytics applied: Ceruminolytics applied prior to the procedure. Location details: right ear Patient tolerance: patient tolerated the procedure well with no immediate complications Comments: Improved hearing, but still had some cerumin. Will get OTC Debrox and attempt to lossen it up tomorrow.  Procedure type: curette  Sedation: Patient sedated: no     Total time spent on today's visit was greater than 30 minutes, including both face-to-face time and nonface-to-face time personally spent on review of chart (labs and imaging), discussing labs and goals, discussing further work-up, treatment options, referrals to specialist if needed, reviewing outside records of pertinent, answering patient's questions, and coordinating care.     Assessment & Plan:  Hearing loss of right ear due to cerumen impaction Assessment & Plan: Hearing greatly improved after removal of cerumen impaction No  redness or possible infection noted Continue to monitor for worsening or changing hear Will plan on follow up if symptoms change or worsen  Orders: -     Ear Cerumen Removal     No orders of the defined types were placed in this encounter.   Orders Placed This Encounter   Procedures   Ear Cerumen Removal     Follow-up: No follow-ups on file.  An After Visit Summary was printed and given to the patient.  Langley Gauss, [facility] [phone]

## 2023-10-03 DIAGNOSIS — H6121 Impacted cerumen, right ear: Secondary | ICD-10-CM | POA: Insufficient documentation

## 2023-10-03 NOTE — Assessment & Plan Note (Signed)
Hearing greatly improved after removal of cerumen impaction No redness or possible infection noted Continue to monitor for worsening or changing hear Will plan on follow up if symptoms change or worsen

## 2023-10-09 ENCOUNTER — Ambulatory Visit (INDEPENDENT_AMBULATORY_CARE_PROVIDER_SITE_OTHER): Payer: Medicare HMO | Admitting: Physician Assistant

## 2023-10-09 ENCOUNTER — Encounter: Payer: Self-pay | Admitting: Physician Assistant

## 2023-10-09 VITALS — BP 128/78 | Temp 97.5°F | Ht 69.0 in | Wt 207.0 lb

## 2023-10-09 DIAGNOSIS — I8390 Asymptomatic varicose veins of unspecified lower extremity: Secondary | ICD-10-CM | POA: Insufficient documentation

## 2023-10-09 DIAGNOSIS — I80201 Phlebitis and thrombophlebitis of unspecified deep vessels of right lower extremity: Secondary | ICD-10-CM | POA: Insufficient documentation

## 2023-10-09 DIAGNOSIS — S8990XA Unspecified injury of unspecified lower leg, initial encounter: Secondary | ICD-10-CM | POA: Diagnosis not present

## 2023-10-09 NOTE — Assessment & Plan Note (Signed)
If phlebitis found on U/S Begin Xarelto 15mg  BID 21 days then 20mg  maintenance Ibuprofen 800mg  TID

## 2023-10-09 NOTE — Progress Notes (Signed)
Acute Office Visit  Subjective:    Patient ID: Thomas Gonzalez, male    DOB: 01/15/1966, 57 y.o.   MRN: [id number]  Chief Complaint  Patient presents with   Knee Pain         HPI: Patient is in today for right knee pain x 10 days,noticed pain after climbing a ladder trimming trees. No known injury. Describes pain as sharp. When resting knee pain if very minor, however once he starts walking, weight baring that is when he has pain. Noticed some swelling. Denied any tenderness, redness or heat. No previous hx of blood clots. Pain 3/10. Discussed the use of AI scribe software for clinical note transcription with the patient, who gave verbal consent to proceed.  History of Present Illness   Mr. Thomas Gonzalez, a 58 year old male with a history of meniscus tear and varicose veins, presents with chronic knee pain that started about a week ago. The onset of the pain coincided with an incident where he was climbing up and down a ladder. He denies any known injury or fall that could have triggered the pain. The pain is described as deep within the knee and also radiating up his leg. He also reports a popping sensation in the knee that started after the ladder incident. The pain seems to worsen with activity and eases when he is at rest. He denies any similar issues in the past.       Past Medical History:  Diagnosis Date   Bowel obstruction (HCC)    06/21/2017 reported- "years ago"   Cancer (HCC)    basal cell face   GERD (gastroesophageal reflux disease)    History of kidney stones    x 2    Past Surgical History:  Procedure Laterality Date   APPENDECTOMY  2019   APPLICATION OF A-CELL OF EXTREMITY N/A 01/27/2021   Procedure: APPLICATION OF MYRIAD OR MatriDerm;  Surgeon: Peggye Form, DO;  Location: [facility];  Service: Plastics;  Laterality: N/A;   COMPLETE ESOPHAGECTOMY N/A 09/20/2020   Procedure: Transhiatal total ESOPHAGECTOMY with Pyloroplasty;  Surgeon: Delight Ovens, MD;  Location: [facility] OR;  Service: Thoracic;  Laterality: N/A;   HERNIA REPAIR Bilateral 2007,2010,2020   Inguinial    INCISION AND DRAINAGE OF WOUND N/A 01/27/2021   Procedure: Debridement of abdominal wound;  Surgeon: Peggye Form, DO;  Location: [facility];  Service: Plastics;  Laterality: N/A;  45 min   INCISIONAL HERNIA REPAIR N/A 02/13/2022   Procedure: OPEN INCISONAL HERNIA REPAIR WITH MESH;  Surgeon: Dossie Der, Hyman Hopes, MD;  Location: [facility];  Service: General;  Laterality: N/A;   JEJUNOSTOMY N/A 09/20/2020   Procedure: Rance Muir;  Surgeon: Delight Ovens, MD;  Location: [facility] OR;  Service: Thoracic;  Laterality: N/A;   KNEE ARTHROSCOPY Left 06/27/2017   Procedure: ARTHROSCOPY KNEE;  Surgeon: Jodi Geralds, MD;  Location: [facility] OR;  Service: Orthopedics;  Laterality: Left;   REMOVAL OF GASTROSTOMY TUBE  09/20/2020   Procedure: REMOVAL OF GASTROSTOMY TUBE;  Surgeon: Delight Ovens, MD;  Location: [facility] OR;  Service: Thoracic;;   VIDEO BRONCHOSCOPY N/A 09/20/2020   Procedure: VIDEO BRONCHOSCOPY;  Surgeon: Delight Ovens, MD;  Location: [facility] OR;  Service: Thoracic;  Laterality: N/A;    Family History  Family history unknown: Yes    Social History   Socioeconomic History   Marital status: Married    Spouse name: Not on file   Number of children: 2  Years of education: Not on file   Highest education level: Not on file  Occupational History    Employer: [employer]  Tobacco Use   Smoking status: Former    Types: Cigarettes   Smokeless tobacco: Former   Tobacco comments:    quit in 1985  Vaping Use   Vaping status: Never Used  Substance and Sexual Activity   Alcohol use: No    Comment: in high school- not since   Drug use: Never   Sexual activity: Yes  Other Topics Concern   Not on file  Social History Narrative   Not on file   Social Determinants of Health   Financial Resource Strain: Low Risk  (06/11/2023)   Overall  Financial Resource Strain (CARDIA)    Difficulty of Paying Living Expenses: Not hard at all  Food Insecurity: No Food Insecurity (06/11/2023)   Hunger Vital Sign    Worried About Running Out of Food in the Last Year: Never true    Ran Out of Food in the Last Year: Never true  Transportation Needs: No Transportation Needs (06/11/2023)   PRAPARE - Administrator, Civil Service (Medical): No    Lack of Transportation (Non-Medical): No  Physical Activity: Sufficiently Active (06/11/2023)   Exercise Vital Sign    Days of Exercise per Week: 5 days    Minutes of Exercise per Session: 30 min  Stress: No Stress Concern Present (06/11/2023)   [employer] of Occupational Health - Occupational Stress Questionnaire    Feeling of Stress : Not at all  Social Connections: Moderately Isolated (06/11/2023)   Social Connection and Isolation Panel [NHANES]    Frequency of Communication with Friends and Family: More than three times a week    Frequency of Social Gatherings with Friends and Family: More than three times a week    Attends Religious Services: Never    Database administrator or Organizations: No    Attends Banker Meetings: Never    Marital Status: Married  Catering manager Violence: Not At Risk (06/11/2023)   Humiliation, Afraid, Rape, and Kick questionnaire    Fear of Current or Ex-Partner: No    Emotionally Abused: No    Physically Abused: No    Sexually Abused: No    Outpatient Medications Prior to Visit  Medication Sig Dispense Refill   acetaminophen (TYLENOL) 325 MG tablet Take 2 tablets (650 mg total) by mouth every 6 (six) hours as needed. (Patient taking differently: Take 650 mg by mouth every 6 (six) hours as needed for mild pain (pain score 1-3), moderate pain (pain score 4-6) or headache.)     ascorbic acid (VITAMIN C) 500 MG tablet Take 500 mg by mouth daily.     cholecalciferol (VITAMIN D3) 25 MCG (1000 UNIT) tablet Take 1,000 Units by mouth daily.      zinc gluconate 50 MG tablet Take 50 mg by mouth daily.     No facility-administered medications prior to visit.    Allergies  Allergen Reactions   Penicillins Anaphylaxis   Prednisone Anxiety    Mood swings    Review of Systems  Constitutional:  Negative for chills and fever.  Respiratory:  Negative for cough.   Cardiovascular:  Negative for chest pain.  Musculoskeletal:        Right knee pain       Objective:        10/09/2023   10:41 AM 09/04/2023   11:00 AM 06/21/2023  1:47 PM  Vitals with BMI  Height 5\' 9"  5\' 9"  5\' 9"   Weight 207 lbs 205 lbs 200 lbs  BMI 30.55 30.26 29.52  Systolic 128 110 347  Diastolic 78 80 83  Pulse  74 89    No data found.   Physical Exam  Health Maintenance Due  Topic Date Due   Medicare Annual Wellness (AWV)  Never done   HIV Screening  Never done   Hepatitis C Screening  Never done   Colonoscopy  Never done    There are no preventive care reminders to display for this patient.   Lab Results  Component Value Date   TSH 1.080 06/12/2023   Lab Results  Component Value Date   WBC 5.7 06/12/2023   HGB 16.9 06/12/2023   HCT 50.5 06/12/2023   MCV 98 (H) 06/12/2023   PLT 212 06/12/2023   Lab Results  Component Value Date   NA 144 06/12/2023   K 5.4 (H) 06/12/2023   CO2 26 06/12/2023   GLUCOSE 91 06/12/2023   BUN 22 06/12/2023   CREATININE 0.85 06/12/2023   BILITOT 0.5 06/12/2023   ALKPHOS 73 06/12/2023   AST 13 06/12/2023   ALT 22 06/12/2023   PROT 6.6 06/12/2023   ALBUMIN 4.2 06/12/2023   CALCIUM 9.6 06/12/2023   ANIONGAP 9 11/03/2022   EGFR 100 06/12/2023   Lab Results  Component Value Date   CHOL 171 06/12/2023   Lab Results  Component Value Date   HDL 57 06/12/2023   Lab Results  Component Value Date   LDLCALC 103 (H) 06/12/2023   Lab Results  Component Value Date   TRIG 57 06/12/2023   Lab Results  Component Value Date   CHOLHDL 3.0 06/12/2023   Lab Results  Component Value Date    HGBA1C 5.7 (H) 06/12/2023       Assessment & Plan:  Deep phlebitis of right leg (HCC) Assessment & Plan: If phlebitis found on U/S Begin Xarelto 15mg  BID 21 days then 20mg  maintenance Ibuprofen 800mg  TID  Orders: -     VAS [place] LOWER EXTREMITY VENOUS (DVT); Future  Asymptomatic varicose veins Assessment & Plan: -Order stat ultrasound of the knee to rule out DVT or phlebitis If abnormal finding on U/S begin Xaralto 15mg  BID for 21 days then maintenance 20mg  Take 800mg  ibuprofen for pain Report to ER with any SOB, chest pain, dizziness   Injury of medial collateral ligament (MCL) of knee Assessment & Plan: Acute onset of knee pain following physical activity (climbing ladder). Pain localized to medial aspect of knee with associated swelling. History of meniscus tear.  If ultrasound negative begin the following: Will order x-ray Begin icing and elevating leg Begin taking 800mg  ibuprofen every 8 hours with food Possible orthopedic referral depending on x-ray and symptoms  Orders: -     DG Knee 1-2 Views Right; Future     No orders of the defined types were placed in this encounter.   Orders Placed This Encounter  Procedures   DG Knee 1-2 Views Right   VAS [place] LOWER EXTREMITY VENOUS (DVT)    Assessment and Plan        Follow-up: No follow-ups on file.  An After Visit Summary was printed and given to the patient.  Langley Gauss, [facility] [phone]

## 2023-10-09 NOTE — Assessment & Plan Note (Signed)
Acute onset of knee pain following physical activity (climbing ladder). Pain localized to medial aspect of knee with associated swelling. History of meniscus tear.  If ultrasound negative begin the following: Will order x-ray Begin icing and elevating leg Begin taking 800mg  ibuprofen every 8 hours with food Possible orthopedic referral depending on x-ray and symptoms

## 2023-10-09 NOTE — Assessment & Plan Note (Addendum)
-  Order stat ultrasound of the knee to rule out DVT or phlebitis If abnormal finding on U/S begin Xaralto 15mg  BID for 21 days then maintenance 20mg  Take 800mg  ibuprofen for pain Report to ER with any SOB, chest pain, dizziness

## 2023-10-10 ENCOUNTER — Encounter: Payer: Self-pay | Admitting: Physician Assistant

## 2023-10-10 DIAGNOSIS — M1711 Unilateral primary osteoarthritis, right knee: Secondary | ICD-10-CM | POA: Diagnosis not present

## 2023-10-10 DIAGNOSIS — M25561 Pain in right knee: Secondary | ICD-10-CM | POA: Diagnosis not present

## 2023-10-11 ENCOUNTER — Encounter: Payer: Self-pay | Admitting: Physician Assistant

## 2023-11-05 NOTE — Progress Notes (Signed)
 [facility]  [address] [place],  [place]  [address] [phone]  Clinic Day:  11/06/2023  Referring physician: No ref. provider found  HISTORY OF PRESENT ILLNESS:  The patient is a 57 y.o. 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  He completed neoadjuvant chemoradiation, which consisted of 3 cycles of FOLFOX chemotherapy.  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  Pathology from this surgery showed persistent adenocarcinoma which extended into the muscularis propria level of his stomach.  Based upon this, he underwent adjuvant nivolumab  immunotherapy for 1 year, which was completed in November 2022.  Scans since then have shown no evidence of disease recurrence.  He comes in today for routine follow-up.  Since his last visit, the patient has been doing well.   He denies having any dysphagia, abdominal discomfort or other GI symptoms which concern him for disease recurrence.     PHYSICAL EXAM:  Blood pressure (!) 131/92, pulse 84, temperature 98.7 F (37.1 C), temperature source Oral, resp. rate 16, height 5\' 9"  (1.753 m), weight 209 lb (94.8 kg), SpO2 99%. Wt Readings from Last 3 Encounters:  02/21/24 207 lb (93.9 kg)  11/06/23 209 lb (94.8 kg)  10/09/23 207 lb (93.9 kg)   Body mass index is 30.86 kg/m. Performance status (ECOG): 1 - Symptomatic but completely ambulatory Physical Exam Constitutional:      General: He is not in acute distress.    Appearance: Normal appearance. He is normal weight.  HENT:     Head: Normocephalic and atraumatic.  Eyes:     General: No scleral icterus.    Extraocular Movements: Extraocular movements intact.     Conjunctiva/sclera: Conjunctivae normal.     Pupils: Pupils are equal, round, and reactive to light.  Cardiovascular:     Rate and Rhythm: Normal rate and regular rhythm.     Pulses: Normal pulses.     Heart sounds: Normal heart  sounds. No murmur heard.    No friction rub. No gallop.  Pulmonary:     Effort: Pulmonary effort is normal. No respiratory distress.     Breath sounds: Normal breath sounds.  Abdominal:     General: Bowel sounds are normal. There is no distension.     Palpations: Abdomen is soft. There is no hepatomegaly, splenomegaly or mass.     Tenderness: There is no abdominal tenderness.  Musculoskeletal:        General: Normal range of motion.     Cervical back: Normal range of motion and neck supple.     Right lower leg: No edema.     Left lower leg: No edema.  Lymphadenopathy:     Cervical: No cervical adenopathy.  Skin:    General: Skin is warm and dry.  Neurological:     General: No focal deficit present.     Mental Status: He is alert and oriented to person, place, and time. Mental status is at baseline.  Psychiatric:        Mood and Affect: Mood normal.        Behavior: Behavior normal.        Thought Content: Thought content normal.        Judgment: Judgment normal.  LABS:  Latest Reference Range & Units 11/06/23 13:19  Sodium 135 - 145 mmol/L 139  Potassium 3.5 - 5.1 mmol/L 4.5  Chloride 98 - 111 mmol/L 105  CO2 22 - 32 mmol/L 25  Glucose 70 - 99 mg/dL 98  BUN 6 - 20 mg/dL 23 (H)  Creatinine 1.61 - 1.24 mg/dL 0.96  Calcium 8.9 - 04.5 mg/dL 9.4  Anion gap 5 - 15  9  Alkaline Phosphatase 38 - 126 U/L 69  Albumin  3.5 - 5.0 g/dL 4.1  AST 15 - 41 U/L 24  ALT 0 - 44 U/L 17  Total Protein 6.5 - 8.1 g/dL 6.6  Total Bilirubin <4.0 mg/dL 0.6  GFR, Est Non African American >60 mL/min >60  WBC 4.0 - 10.5 K/uL 6.2  RBC 4.22 - 5.81 MIL/uL 5.06  Hemoglobin 13.0 - 17.0 g/dL 98.1  HCT 19.1 - 47.8 % 47.2  MCV 80.0 - 100.0 fL 93.3  MCH 26.0 - 34.0 pg 32.6  MCHC 30.0 - 36.0 g/dL 29.5  RDW 62.1 - 30.8 % 13.1  Platelets 150 - 400 K/uL 218  nRBC 0.0 - 0.2 % 0 /100 WBC 0.00 0  Neutrophils % 60  Lymphocytes % 23  Monocytes Relative % 12  Eosinophil % 4  Basophil % 1  Immature  Granulocytes % 0  NEUT# 1.7 - 7.7 K/uL 3.7  Lymphs Abs 0.7 - 4.0 K/uL 1.4  Monocyte # 0.1 - 1.0 K/uL 0.8  Eosinophils Absolute 0.0 - 0.5 K/uL 0.2  Basophils Absolute 0.0 - 0.1 K/uL 0.0  Abs Immature Granulocytes 0.00 - 0.07 K/uL 0.02  (H): Data is abnormally high  ASSESSMENT & PLAN:  Assessment/Plan:  A 57 y.o. male with stage IVA (T3 N2 M0) adenocarcinoma of his gastroesophageal junction, status post neoadjuvant chemotherapy, followed by an esophagectomy in November 2021, and 1 year of adjuvant nivolumab  immunotherapy.  Based upon his physical exam today, the patient remains disease-free.  Clinically, he continues to do very well.  I will see him back in 6 months for repeat clinical assessment.  Repeat CT scans will be done at that time for his continued radiographic gastroesophageal junction cancer surveillance. The patient understands all the plans discussed today and is in agreement with them.    Ellanore Vanhook Felicia Horde, MD

## 2023-11-06 ENCOUNTER — Inpatient Hospital Stay: Payer: Medicare HMO | Attending: Oncology | Admitting: Oncology

## 2023-11-06 ENCOUNTER — Other Ambulatory Visit: Payer: Self-pay | Admitting: Oncology

## 2023-11-06 ENCOUNTER — Inpatient Hospital Stay: Payer: Medicare HMO

## 2023-11-06 VITALS — BP 131/92 | HR 84 | Temp 98.7°F | Resp 16 | Ht 69.0 in | Wt 209.0 lb

## 2023-11-06 DIAGNOSIS — C160 Malignant neoplasm of cardia: Secondary | ICD-10-CM | POA: Diagnosis not present

## 2023-11-06 DIAGNOSIS — C159 Malignant neoplasm of esophagus, unspecified: Secondary | ICD-10-CM

## 2023-11-06 LAB — CBC WITH DIFFERENTIAL (CANCER CENTER ONLY)
Abs Immature Granulocytes: 0.02 10*3/uL (ref 0.00–0.07)
Basophils Absolute: 0 10*3/uL (ref 0.0–0.1)
Basophils Relative: 1 %
Eosinophils Absolute: 0.2 10*3/uL (ref 0.0–0.5)
Eosinophils Relative: 4 %
HCT: 47.2 % (ref 39.0–52.0)
Hemoglobin: 16.5 g/dL (ref 13.0–17.0)
Immature Granulocytes: 0 %
Lymphocytes Relative: 23 %
Lymphs Abs: 1.4 10*3/uL (ref 0.7–4.0)
MCH: 32.6 pg (ref 26.0–34.0)
MCHC: 35 g/dL (ref 30.0–36.0)
MCV: 93.3 fL (ref 80.0–100.0)
Monocytes Absolute: 0.8 10*3/uL (ref 0.1–1.0)
Monocytes Relative: 12 %
Neutro Abs: 3.7 10*3/uL (ref 1.7–7.7)
Neutrophils Relative %: 60 %
Platelet Count: 218 10*3/uL (ref 150–400)
RBC: 5.06 MIL/uL (ref 4.22–5.81)
RDW: 13.1 % (ref 11.5–15.5)
WBC Count: 6.2 10*3/uL (ref 4.0–10.5)
nRBC: 0 % (ref 0.0–0.2)
nRBC: 0 /100{WBCs}

## 2023-11-06 LAB — CMP (CANCER CENTER ONLY)
ALT: 17 U/L (ref 0–44)
AST: 24 U/L (ref 15–41)
Albumin: 4.1 g/dL (ref 3.5–5.0)
Alkaline Phosphatase: 69 U/L (ref 38–126)
Anion gap: 9 (ref 5–15)
BUN: 23 mg/dL — ABNORMAL HIGH (ref 6–20)
CO2: 25 mmol/L (ref 22–32)
Calcium: 9.4 mg/dL (ref 8.9–10.3)
Chloride: 105 mmol/L (ref 98–111)
Creatinine: 0.88 mg/dL (ref 0.61–1.24)
GFR, Estimated: >60 mL/min (ref >60)
Glucose, Bld: 98 mg/dL (ref 70–99)
Potassium: 4.5 mmol/L (ref 3.5–5.1)
Sodium: 139 mmol/L (ref 135–145)
Total Bilirubin: 0.6 mg/dL (ref <1.2)
Total Protein: 6.6 g/dL (ref 6.5–8.1)

## 2024-02-21 ENCOUNTER — Other Ambulatory Visit (HOSPITAL_BASED_OUTPATIENT_CLINIC_OR_DEPARTMENT_OTHER): Payer: Self-pay

## 2024-02-21 ENCOUNTER — Encounter: Payer: Self-pay | Admitting: Physician Assistant

## 2024-02-21 ENCOUNTER — Ambulatory Visit (HOSPITAL_BASED_OUTPATIENT_CLINIC_OR_DEPARTMENT_OTHER)
Admission: RE | Admit: 2024-02-21 | Discharge: 2024-02-21 | Disposition: A | Discharge status: Home / Self Care | Source: Ambulatory Visit | Attending: Physician Assistant | Admitting: Radiology

## 2024-02-21 ENCOUNTER — Ambulatory Visit: Admitting: Physician Assistant

## 2024-02-21 VITALS — BP 124/78 | HR 82 | Temp 98.6°F | Ht 69.0 in | Wt 207.0 lb

## 2024-02-21 DIAGNOSIS — R0602 Shortness of breath: Secondary | ICD-10-CM

## 2024-02-21 DIAGNOSIS — R051 Acute cough: Secondary | ICD-10-CM

## 2024-02-21 DIAGNOSIS — C159 Malignant neoplasm of esophagus, unspecified: Secondary | ICD-10-CM | POA: Diagnosis not present

## 2024-02-21 DIAGNOSIS — S8990XA Unspecified injury of unspecified lower leg, initial encounter: Secondary | ICD-10-CM | POA: Diagnosis not present

## 2024-02-21 DIAGNOSIS — R059 Cough, unspecified: Secondary | ICD-10-CM

## 2024-02-21 LAB — POCT INFLUENZA A/B
Influenza A, POC: NEGATIVE
Influenza B, POC: NEGATIVE

## 2024-02-21 LAB — POC COVID19 BINAXNOW: SARS Coronavirus 2 Ag: NEGATIVE

## 2024-02-21 MED ORDER — DOXYCYCLINE HYCLATE 100 MG PO TABS
100.0000 mg | ORAL_TABLET | Freq: Two times a day (BID) | ORAL | 0 refills | Status: DC
  Filled 2024-02-21: qty 14, 7d supply, fill #0

## 2024-02-21 MED ORDER — ALBUTEROL SULFATE HFA 108 (90 BASE) MCG/ACT IN AERS
2.0000 | INHALATION_SPRAY | Freq: Four times a day (QID) | RESPIRATORY_TRACT | 2 refills | Status: AC | PRN
  Filled 2024-02-21: qty 6.7, 25d supply, fill #0

## 2024-02-21 NOTE — Progress Notes (Signed)
 Acute Office Visit  Subjective:    Patient ID: Thomas Gonzalez, male    DOB: 09/08/1966, 58 y.o.   MRN: [id number]  Chief Complaint  Patient presents with   Cough    Congestion/fever   HPI: Patient is in today for worsening cough and chest congestion  Discussed the use of AI scribe software for clinical note transcription with the patient, who gave verbal consent to proceed.  History of Present Illness   The patient, with a history of knee pain, presents with symptoms of a respiratory infection that began on Monday. The most severe symptom is a persistent cough that kept him awake all night. He also reports a fever on the previous two evenings, which he managed with Tylenol. He describes sinus congestion, runniness, and sneezing, but the pressure seems to be originating from the chest, not the sinuses. He also reports fatigue and shortness of breath. The patient denies dizziness. He has not used an albuterol inhaler before. He has allergies to penicillins and prednisone. The patient's knee pain is improving, with some residual irritation but no worsening pain.       Past Medical History:  Diagnosis Date   Bowel obstruction (HCC)    06/21/2017 reported- "years ago"   Cancer (HCC)    basal cell face   GERD (gastroesophageal reflux disease)    History of kidney stones    x 2    Past Surgical History:  Procedure Laterality Date   APPENDECTOMY  2019   APPLICATION OF A-CELL OF EXTREMITY N/A 01/27/2021   Procedure: APPLICATION OF MYRIAD OR MatriDerm;  Surgeon: Peggye Form, DO;  Location: [facility];  Service: Plastics;  Laterality: N/A;   COMPLETE ESOPHAGECTOMY N/A 09/20/2020   Procedure: Transhiatal total ESOPHAGECTOMY with Pyloroplasty;  Surgeon: Delight Ovens, MD;  Location[facility] OR;  Service: Thoracic;  Laterality: N/A;   HERNIA REPAIR Bilateral 2007,2010,2020   Inguinial    INCISION AND DRAINAGE OF WOUND N/A 01/27/2021   Procedure: Debridement of  abdominal wound;  Surgeon: Peggye Form, DO;  Location: [facility];  Service: Plastics;  Laterality: N/A;  45 min   INCISIONAL HERNIA REPAIR N/A 02/13/2022   Procedure: OPEN INCISONAL HERNIA REPAIR WITH MESH;  Surgeon: Dossie Der, Hyman Hopes, MD;  Location: [facility];  Service: General;  Laterality: N/A;   JEJUNOSTOMY N/A 09/20/2020   Procedure: Rance Muir;  Surgeon: Delight Ovens, MD;  Location: [facility] OR;  Service: Thoracic;  Laterality: N/A;   KNEE ARTHROSCOPY Left 06/27/2017   Procedure: ARTHROSCOPY KNEE;  Surgeon: Jodi Geralds, MD;  Location: [facility] OR;  Service: Orthopedics;  Laterality: Left;   REMOVAL OF GASTROSTOMY TUBE  09/20/2020   Procedure: REMOVAL OF GASTROSTOMY TUBE;  Surgeon: Delight Ovens, MD;  Location: [facility] OR;  Service: Thoracic;;   VIDEO BRONCHOSCOPY N/A 09/20/2020   Procedure: VIDEO BRONCHOSCOPY;  Surgeon: Delight Ovens, MD;  Location: [facility] OR;  Service: Thoracic;  Laterality: N/A;    Family History  Family history unknown: Yes    Social History   Socioeconomic History   Marital status: Married    Spouse name: Not on file   Number of children: 2   Years of education: Not on file   Highest education level: Not on file  Occupational History    Employer: [employer]  Tobacco Use   Smoking status: Former    Types: Cigarettes   Smokeless tobacco: Former   Tobacco comments:    quit in 1985  Vaping Use   Vaping status: Never Used  Substance and Sexual Activity   Alcohol use: No    Comment: in high school- not since   Drug use: Never   Sexual activity: Yes  Other Topics Concern   Not on file  Social History Narrative   Not on file   Social Drivers of Health   Financial Resource Strain: Low Risk  (06/11/2023)   Overall Financial Resource Strain (CARDIA)    Difficulty of Paying Living Expenses: Not hard at all  Food Insecurity: No Food Insecurity (06/11/2023)   Hunger Vital Sign    Worried About Running Out of Food in the Last  Year: Never true    Ran Out of Food in the Last Year: Never true  Transportation Needs: No Transportation Needs (06/11/2023)   PRAPARE - Administrator, Civil Service (Medical): No    Lack of Transportation (Non-Medical): No  Physical Activity: Sufficiently Active (06/11/2023)   Exercise Vital Sign    Days of Exercise per Week: 5 days    Minutes of Exercise per Session: 30 min  Stress: No Stress Concern Present (06/11/2023)   [employer] of Occupational Health - Occupational Stress Questionnaire    Feeling of Stress : Not at all  Social Connections: Moderately Isolated (06/11/2023)   Social Connection and Isolation Panel [NHANES]    Frequency of Communication with Friends and Family: More than three times a week    Frequency of Social Gatherings with Friends and Family: More than three times a week    Attends Religious Services: Never    Database administrator or Organizations: No    Attends Banker Meetings: Never    Marital Status: Married  Catering manager Violence: Not At Risk (06/11/2023)   Humiliation, Afraid, Rape, and Kick questionnaire    Fear of Current or Ex-Partner: No    Emotionally Abused: No    Physically Abused: No    Sexually Abused: No    Outpatient Medications Prior to Visit  Medication Sig Dispense Refill   acetaminophen (TYLENOL) 325 MG tablet Take 2 tablets (650 mg total) by mouth every 6 (six) hours as needed. (Patient taking differently: Take 650 mg by mouth every 6 (six) hours as needed for mild pain (pain score 1-3), moderate pain (pain score 4-6) or headache.)     ascorbic acid (VITAMIN C) 500 MG tablet Take 500 mg by mouth daily.     cholecalciferol (VITAMIN D3) 25 MCG (1000 UNIT) tablet Take 1,000 Units by mouth daily.     zinc gluconate 50 MG tablet Take 50 mg by mouth daily.     No facility-administered medications prior to visit.    Allergies  Allergen Reactions   Penicillins Anaphylaxis   Prednisone Anxiety    Mood  swings    Review of Systems  Constitutional:  Positive for fever. Negative for appetite change and fatigue.  HENT:  Positive for congestion. Negative for ear pain, sinus pressure and sore throat.   Respiratory:  Positive for cough. Negative for chest tightness, shortness of breath and wheezing.   Cardiovascular:  Negative for chest pain and palpitations.  Gastrointestinal:  Negative for abdominal pain, constipation, diarrhea, nausea and vomiting.  Genitourinary:  Negative for dysuria and hematuria.  Musculoskeletal:  Positive for myalgias (Body aches). Negative for arthralgias, back pain and joint swelling.  Skin:  Negative for rash.  Neurological:  Negative for dizziness, weakness and headaches.  Psychiatric/Behavioral:  Negative for dysphoric mood. The patient  is not nervous/anxious.        Objective:        02/21/2024   10:22 AM 11/06/2023    1:36 PM 10/09/2023   10:41 AM  Vitals with BMI  Height 5\' 9"  5\' 9"  5\' 9"   Weight 207 lbs 209 lbs 207 lbs  BMI 30.55 30.85 30.55  Systolic 124 131 161  Diastolic 78 92 78  Pulse 82 84     Orthostatic VS for the past 72 hrs (Last 3 readings):  Patient Position BP Location  02/21/24 1022 Sitting Left Arm     Physical Exam Vitals reviewed.  Constitutional:      Appearance: He is ill-appearing. He is not diaphoretic.  Neck:     Vascular: No carotid bruit.  Cardiovascular:     Rate and Rhythm: Normal rate and regular rhythm.     Heart sounds: Normal heart sounds.  Pulmonary:     Effort: Pulmonary effort is normal.     Breath sounds: Examination of the right-lower field reveals decreased breath sounds. Decreased breath sounds present.  Abdominal:     General: Bowel sounds are normal.     Palpations: Abdomen is soft.     Tenderness: There is no abdominal tenderness.  Neurological:     Mental Status: He is alert and oriented to person, place, and time.  Psychiatric:        Mood and Affect: Mood normal.        Behavior: Behavior  normal.     Health Maintenance Due  Topic Date Due   Medicare Annual Wellness (AWV)  Never done   COVID-19 Vaccine (1) Never done   HIV Screening  Never done   Hepatitis C Screening  Never done   Zoster Vaccines- Shingrix (1 of 2) Never done    There are no preventive care reminders to display for this patient.   Lab Results  Component Value Date   TSH 1.080 06/12/2023   Lab Results  Component Value Date   WBC 6.2 11/06/2023   HGB 16.5 11/06/2023   HCT 47.2 11/06/2023   MCV 93.3 11/06/2023   PLT 218 11/06/2023   Lab Results  Component Value Date   NA 139 11/06/2023   K 4.5 11/06/2023   CO2 25 11/06/2023   GLUCOSE 98 11/06/2023   BUN 23 (H) 11/06/2023   CREATININE 0.88 11/06/2023   BILITOT 0.6 11/06/2023   ALKPHOS 69 11/06/2023   AST 24 11/06/2023   ALT 17 11/06/2023   PROT 6.6 11/06/2023   ALBUMIN 4.1 11/06/2023   CALCIUM 9.4 11/06/2023   ANIONGAP 9 11/06/2023   EGFR 100 06/12/2023   Lab Results  Component Value Date   CHOL 171 06/12/2023   Lab Results  Component Value Date   HDL 57 06/12/2023   Lab Results  Component Value Date   LDLCALC 103 (H) 06/12/2023   Lab Results  Component Value Date   TRIG 57 06/12/2023   Lab Results  Component Value Date   CHOLHDL 3.0 06/12/2023   Lab Results  Component Value Date   HGBA1C 5.7 (H) 06/12/2023       Assessment & Plan:  Acute cough Assessment & Plan: Negative for influenza and COVID-19. Decreased breath sounds on the right side suggest possible fluid accumulation. Differential includes bronchitis and pneumonia. Treating for bronchitis pending chest x-ray results. - Order chest x-ray to rule out pneumonia. - Prescribe antibiotic for bronchitis. - Prescribe albuterol inhaler for shortness of breath. - Advise rest over  the weekend. - Instruct to visit urgent care if symptoms worsen. - Send medications to pharmacy at [facility].  Orders: -     POCT Influenza A/B -     POC COVID-19  BinaxNow -     DG Chest 2 View; Future -     Doxycycline Hyclate; Take 1 tablet (100 mg total) by mouth 2 (two) times daily.  Dispense: 14 tablet; Refill: 0 -     Albuterol Sulfate HFA; Inhale 2 puffs into the lungs every 6 (six) hours as needed for wheezing or shortness of breath.  Dispense: 6.7 g; Refill: 2  Injury of medial collateral ligament (MCL) of knee Assessment & Plan: Ongoing knee irritation without worsening pain. Previous x-ray showed no significant findings.       Meds ordered this encounter  Medications   doxycycline (VIBRA-TABS) 100 MG tablet    Sig: Take 1 tablet (100 mg total) by mouth 2 (two) times daily.    Dispense:  14 tablet    Refill:  0   albuterol (VENTOLIN HFA) 108 (90 Base) MCG/ACT inhaler    Sig: Inhale 2 puffs into the lungs every 6 (six) hours as needed for wheezing or shortness of breath.    Dispense:  6.7 g    Refill:  2    Orders Placed This Encounter  Procedures   DG Chest 2 View   POCT Influenza A/B   POC COVID-19 BinaxNow     Follow-up: Return if symptoms worsen or fail to improve.  An After Visit Summary was printed and given to the patient.     I,[name] M Auman,acting as a Neurosurgeon for [employer], [employer].,have documented all relevant documentation on the behalf of Langley Gauss, PA,as directed by  Langley Gauss, PA while in the presence of Langley Gauss, [place].  Langley Gauss, [place] [facility] [phone]

## 2024-02-21 NOTE — Assessment & Plan Note (Signed)
 Negative for influenza and COVID-19. Decreased breath sounds on the right side suggest possible fluid accumulation. Differential includes bronchitis and pneumonia. Treating for bronchitis pending chest x-ray results. - Order chest x-ray to rule out pneumonia. - Prescribe antibiotic for bronchitis. - Prescribe albuterol inhaler for shortness of breath. - Advise rest over the weekend. - Instruct to visit urgent care if symptoms worsen. - Send medications to pharmacy at [facility].

## 2024-02-21 NOTE — Patient Instructions (Signed)
 VISIT SUMMARY:  You came in today with symptoms of a respiratory infection, including a persistent cough, fever, sinus congestion, fatigue, and shortness of breath. We also discussed your ongoing knee irritation and a recent finding of blood in your urine.  YOUR PLAN:  -BRONCHITIS: Bronchitis is an inflammation of the bronchial tubes in your lungs, often causing cough and shortness of breath. We are treating you for bronchitis while we wait for the results of a chest x-ray to rule out pneumonia. You have been prescribed an antibiotic and an albuterol inhaler to help with your symptoms. Please rest over the weekend and visit urgent care if your symptoms worsen.  -KNEE IRRITATION: Your knee irritation is improving, and there is no worsening pain. Previous x-rays did not show any significant issues. Continue with your current management plan.  -HEMATURIA: Hematuria means there is blood in your urine. Your urine culture, kidney function tests, and PSA levels are normal. We are referring you to a urologist to rule out any bladder issues.  INSTRUCTIONS:  Please get a chest x-ray as soon as possible to rule out pneumonia. Follow up with urgent care if your respiratory symptoms worsen. We have sent your medications to the pharmacy at [facility]. Additionally, schedule an appointment with a urologist for further evaluation of hematuria.

## 2024-02-21 NOTE — Assessment & Plan Note (Signed)
 Ongoing knee irritation without worsening pain. Previous x-ray showed no significant findings.

## 2024-04-17 ENCOUNTER — Ambulatory Visit (INDEPENDENT_AMBULATORY_CARE_PROVIDER_SITE_OTHER)
Admission: RE | Admit: 2024-04-17 | Discharge: 2024-04-17 | Disposition: A | Discharge status: Home / Self Care | Source: Ambulatory Visit | Attending: Oncology | Admitting: Oncology

## 2024-04-17 DIAGNOSIS — C159 Malignant neoplasm of esophagus, unspecified: Secondary | ICD-10-CM

## 2024-04-17 DIAGNOSIS — I700 Atherosclerosis of aorta: Secondary | ICD-10-CM | POA: Diagnosis not present

## 2024-04-17 DIAGNOSIS — K5730 Diverticulosis of large intestine without perforation or abscess without bleeding: Secondary | ICD-10-CM | POA: Diagnosis not present

## 2024-04-17 MED ORDER — IOHEXOL 300 MG/ML  SOLN
100.0000 mL | Freq: Once | INTRAMUSCULAR | Status: AC | PRN
Start: 2024-04-17 — End: 2024-04-17
  Administered 2024-04-17: 100 mL via INTRAVENOUS

## 2024-05-05 NOTE — Progress Notes (Unsigned)
 [facility]  [address] [place],  [place]  [address] [phone]  Clinic Day:  05/06/2024  Referring physician: Odilia Bennett, PA  HISTORY OF PRESENT ILLNESS:  The patient is a 58 y.o. 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  He comes in today for routine follow-up, as well as to review his most recent CT scans.  Since his last visit, the patient has been doing well.   He denies having any dysphagia, abdominal discomfort or other GI symptoms which concern him for disease recurrence.  With respect to his disease management, he completed neoadjuvant chemoradiation, which consisted of 3 cycles of FOLFOX chemotherapy.  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  Pathology from this surgery showed persistent adenocarcinoma which extended into the muscularis propria level of his stomach.  Based upon there being evidence of residual disease, he underwent adjuvant nivolumab  immunotherapy for 1 year, which was completed in November 2022.  Scans since then have shown no evidence of disease recurrence.       PHYSICAL EXAM:  Blood pressure 115/85, pulse 74, temperature 98.8 F (37.1 C), temperature source Oral, resp. rate 20, height 5' 9 (1.753 m), weight 212 lb 4.8 oz (96.3 kg), SpO2 96%. Wt Readings from Last 3 Encounters:  05/06/24 212 lb 4.8 oz (96.3 kg)  02/21/24 207 lb (93.9 kg)  11/06/23 209 lb (94.8 kg)   Body mass index is 31.35 kg/m. Performance status (ECOG): 1 - Symptomatic but completely ambulatory Physical Exam Constitutional:      General: He is not in acute distress.    Appearance: Normal appearance. He is normal weight.  HENT:     Head: Normocephalic and atraumatic.   Eyes:     General: No scleral icterus.    Extraocular Movements: Extraocular movements intact.     Conjunctiva/sclera: Conjunctivae normal.     Pupils: Pupils are equal, round, and reactive to light.     Cardiovascular:     Rate and Rhythm: Normal rate and regular rhythm.     Pulses: Normal pulses.     Heart sounds: Normal heart sounds. No murmur heard.    No friction rub. No gallop.  Pulmonary:     Effort: Pulmonary effort is normal. No respiratory distress.     Breath sounds: Normal breath sounds.  Abdominal:     General: Bowel sounds are normal. There is no distension.     Palpations: Abdomen is soft. There is no hepatomegaly, splenomegaly or mass.     Tenderness: There is no abdominal tenderness.   Musculoskeletal:        General: Normal range of motion.     Cervical back: Normal range of motion and neck supple.     Right lower leg: No edema.     Left lower leg: No edema.  Lymphadenopathy:     Cervical: No cervical adenopathy.   Skin:    General: Skin is warm and dry.   Neurological:     General: No focal deficit present.     Mental Status: He is alert and oriented to person, place, and time. Mental status is at baseline.   Psychiatric:        Mood and Affect: Mood normal.        Behavior: Behavior normal.        Thought Content: Thought content normal.        Judgment: Judgment normal.   SCANS:  CT scans  of his chest/abdomen/pelvis on 04-17-24 revealed the following: FINDINGS: CT CHEST FINDINGS   Cardiovascular: Right chest Port-A-Cath with the distal tip in the superior cavoatrial junction. No significant vascular findings. Normal heart size. No pericardial effusion.   Mediastinum/Nodes: Prior esophagectomy with gastric pull-through. No focal abnormality at the suture margins. No pathologically enlarged mediastinal, hilar or axillary lymph nodes. Normal thyroid  gland and trachea.   Lungs/Pleura: Lungs are clear. Stable 5 mm right upper lobe nodule (64:302). No pleural effusion or pneumothorax. No new or suspicious pulmonary nodule.   Musculoskeletal: No chest wall mass or suspicious bone lesions identified. Mild multilevel degenerative changes of the  thoracic spine.   CT ABDOMEN PELVIS FINDINGS   Hepatobiliary: No focal liver abnormality is seen. No gallstones, gallbladder wall thickening, or biliary dilatation.   Pancreas: Unremarkable. No pancreatic ductal dilatation or surrounding inflammatory changes.   Spleen: Normal in size without focal abnormality.   Adrenals/Urinary Tract: Adrenal glands are unremarkable. Kidneys are normal, without renal calculi, focal lesion, or hydronephrosis. Bladder is unremarkable.   Stomach/Bowel: Prior gastric pull-through. Normal small bowel without obstruction. Colonic diverticulosis without pericolonic inflammation. Appendix not visualized.   Vascular/Lymphatic: Aortic atherosclerosis. No enlarged abdominal or pelvic lymph nodes.   Reproductive: Mild prostatomegaly with dystrophic calcifications.   Other: Prior left inguinal hernia repair. No abdominopelvic ascites.   Musculoskeletal: No acute or significant osseous findings. Multilevel degenerative changes.   IMPRESSION: 1. Prior esophagectomy with gastric pull-through. 2. No evidence of local recurrence or metastatic disease in the chest, abdomen or pelvis.  ASSESSMENT & PLAN:  Assessment/Plan:  A 58 y.o. male with stage IVA (T3 N2 M0) adenocarcinoma of his gastroesophageal junction, status post neoadjuvant chemotherapy, followed by an esophagectomy in November 2021, and 1 year of adjuvant nivolumab  immunotherapy.  In clinic today, I went over all of his CT scan images with him, for which he could see there remains no radiographic evidence of disease recurrence.  Understandably, the patient was very pleased with his CT scan results.  Clinically, he continues to do very well.  As that is the case, I will see him back in 6 months for repeat clinical assessment.  The patient understands all the plans discussed today and is in agreement with them.    Camilia Caywood Felicia Horde, MD

## 2024-05-06 ENCOUNTER — Inpatient Hospital Stay: Payer: Medicare HMO | Attending: Oncology | Admitting: Oncology

## 2024-05-06 ENCOUNTER — Other Ambulatory Visit: Payer: Self-pay | Admitting: Oncology

## 2024-05-06 ENCOUNTER — Other Ambulatory Visit: Payer: Self-pay

## 2024-05-06 ENCOUNTER — Telehealth: Payer: Self-pay | Admitting: Oncology

## 2024-05-06 VITALS — BP 115/85 | HR 74 | Temp 98.8°F | Resp 20 | Ht 69.0 in | Wt 212.3 lb

## 2024-05-06 DIAGNOSIS — Z923 Personal history of irradiation: Secondary | ICD-10-CM

## 2024-05-06 DIAGNOSIS — Z9049 Acquired absence of other specified parts of digestive tract: Secondary | ICD-10-CM | POA: Insufficient documentation

## 2024-05-06 DIAGNOSIS — C159 Malignant neoplasm of esophagus, unspecified: Secondary | ICD-10-CM

## 2024-05-06 DIAGNOSIS — Z8501 Personal history of malignant neoplasm of esophagus: Secondary | ICD-10-CM | POA: Diagnosis not present

## 2024-05-06 DIAGNOSIS — C160 Malignant neoplasm of cardia: Secondary | ICD-10-CM

## 2024-05-06 DIAGNOSIS — Z9221 Personal history of antineoplastic chemotherapy: Secondary | ICD-10-CM | POA: Diagnosis not present

## 2024-05-06 NOTE — Telephone Encounter (Signed)
 Patient has been scheduled for follow-up visit per 05/06/24 LOS.  Pt given an appt calendar with date and time.

## 2024-08-28 ENCOUNTER — Ambulatory Visit (INDEPENDENT_AMBULATORY_CARE_PROVIDER_SITE_OTHER): Admitting: Physician Assistant

## 2024-08-28 ENCOUNTER — Encounter: Payer: Self-pay | Admitting: Physician Assistant

## 2024-08-28 VITALS — BP 124/92 | HR 68 | Temp 97.3°F | Ht 69.0 in | Wt 217.0 lb

## 2024-08-28 DIAGNOSIS — Z0000 Encounter for general adult medical examination without abnormal findings: Secondary | ICD-10-CM | POA: Diagnosis not present

## 2024-08-28 NOTE — Progress Notes (Signed)
 Subjective:   Thomas Gonzalez is a 58 y.o. male who presents for Medicare Annual/Subsequent preventive examination.  Visit Complete: In person  Patient Medicare AWV questionnaire was completed by the patient on 08/28/2024; I have confirmed that all information answered by patient is correct and no changes since this date.  Discussed the use of AI scribe software for clinical note transcription with the patient, who gave verbal consent to proceed.  History of Present Illness Thomas Gonzalez is a 59 year old male who presents for a routine follow-up visit.  He has a history of cancer diagnosed in 2021, with treatment leading to significant improvement by 2023. He follows up with oncology twice a year, including an annual PET scan. No difficulty swallowing.  He has a history of smoking but quit years ago. He is currently not using albuterol  as he has not had a need for it recently, and his cough has resolved. His current medications include Tylenol  as needed, and he takes vitamin C, vitamin D, and zinc.  No issues with his blood pressure medications and no headaches.  He is up to date with his colonoscopy, with the next one due in 2031. He is not interested in receiving flu, pneumonia, or shingles vaccines at this time.    Cardiac Risk Factors include: advanced age (>66men, >72 women)     Objective:    Today's Vitals   08/28/24 1433  BP: (!) 124/92  Pulse: 68  Temp: (!) 97.3 F (36.3 C)  TempSrc: Temporal  SpO2: 98%  Weight: 217 lb (98.4 kg)  Height: 5' 9 (1.753 m)   Body mass index is 32.05 kg/m.     08/28/2024    2:20 PM 05/06/2024    1:41 PM 11/06/2023    1:35 PM 05/08/2023    3:16 PM 11/03/2022    3:01 PM 05/05/2022    2:47 PM 02/13/2022    6:00 PM  Advanced Directives  Does Patient Have a Medical Advance Directive? No No No No No No No  Would patient like information on creating a medical advance directive? No - Patient declined No - Patient declined      No - Patient declined    Current Medications (verified) Outpatient Encounter Medications as of 08/28/2024  Medication Sig   acetaminophen  (TYLENOL ) 325 MG tablet Take 2 tablets (650 mg total) by mouth every 6 (six) hours as needed.   albuterol  (VENTOLIN  HFA) 108 (90 Base) MCG/ACT inhaler Inhale 2 puffs into the lungs every 6 (six) hours as needed for wheezing or shortness of breath.   ascorbic acid (VITAMIN C) 500 MG tablet Take 500 mg by mouth daily.   cholecalciferol (VITAMIN D3) 25 MCG (1000 UNIT) tablet Take 1,000 Units by mouth daily.   zinc gluconate 50 MG tablet Take 50 mg by mouth daily.   [DISCONTINUED] doxycycline  (VIBRA -TABS) 100 MG tablet Take 1 tablet (100 mg total) by mouth 2 (two) times daily.   No facility-administered encounter medications on file as of 08/28/2024.    Allergies (verified) Penicillins and Prednisone    History: Past Medical History:  Diagnosis Date   Bowel obstruction (HCC)    06/21/2017 reported- years ago   Cancer (HCC)    basal cell face   GERD (gastroesophageal reflux disease)    History of kidney stones    x 2   Past Surgical History:  Procedure Laterality Date   APPENDECTOMY  2019   APPLICATION OF A-CELL OF EXTREMITY N/A 01/27/2021   Procedure: APPLICATION OF  MYRIAD OR MatriDerm;  Surgeon: Lowery Estefana RAMAN, DO;  Location: [facility];  Service: Plastics;  Laterality: N/A;   COMPLETE ESOPHAGECTOMY N/A 09/20/2020   Procedure: Transhiatal total ESOPHAGECTOMY with Pyloroplasty;  Surgeon: Army Dallas NOVAK, MD;  Location: [facility] OR;  Service: Thoracic;  Laterality: N/A;   HERNIA REPAIR Bilateral 2007,2010,2020   Inguinial    INCISION AND DRAINAGE OF WOUND N/A 01/27/2021   Procedure: Debridement of abdominal wound;  Surgeon: Lowery Estefana RAMAN, DO;  Location: [facility];  Service: Plastics;  Laterality: N/A;  45 min   INCISIONAL HERNIA REPAIR N/A 02/13/2022   Procedure: OPEN INCISONAL HERNIA REPAIR WITH MESH;   Surgeon: Lyndel, Deward PARAS, MD;  Location: [facility];  Service: General;  Laterality: N/A;   JEJUNOSTOMY N/A 09/20/2020   Procedure: DEXTER;  Surgeon: Army Dallas NOVAK, MD;  [facility] OR;  Service: Thoracic;  Laterality: N/A;   KNEE ARTHROSCOPY Left 06/27/2017   Procedure: ARTHROSCOPY KNEE;  Surgeon: Yvone Rush, MD;  Location: [facility] OR;  Service: Orthopedics;  Laterality: Left;   REMOVAL OF GASTROSTOMY TUBE  09/20/2020   Procedure: REMOVAL OF GASTROSTOMY TUBE;  Surgeon: Army Dallas NOVAK, MD;  Location: [facility] OR;  Service: Thoracic;;   VIDEO BRONCHOSCOPY N/A 09/20/2020   Procedure: VIDEO BRONCHOSCOPY;  Surgeon: Army Dallas NOVAK, MD;  Location: [facility] OR;  Service: Thoracic;  Laterality: N/A;   Family History  Family history unknown: Yes   Social History   Socioeconomic History   Marital status: Married    Spouse name: Not on file   Number of children: 2   Years of education: Not on file   Highest education level: Not on file  Occupational History    Employer: [employer]  Tobacco Use   Smoking status: Former    Types: Cigarettes   Smokeless tobacco: Former   Tobacco comments:    quit in 1985  Vaping Use   Vaping status: Never Used  Substance and Sexual Activity   Alcohol use: No    Comment: in high school- not since   Drug use: Never   Sexual activity: Yes  Other Topics Concern   Not on file  Social History Narrative   Not on file   Social Drivers of Health   Financial Resource Strain: Low Risk  (08/28/2024)   Overall Financial Resource Strain (CARDIA)    Difficulty of Paying Living Expenses: Not hard at all  Food Insecurity: No Food Insecurity (08/28/2024)   Hunger Vital Sign    Worried About Running Out of Food in the Last Year: Never true    Ran Out of Food in the Last Year: Never true  Transportation Needs: No Transportation Needs (08/28/2024)   PRAPARE - Administrator, Civil Service (Medical): No    Lack of Transportation (Non-Medical): No   Physical Activity: Sufficiently Active (08/28/2024)   Exercise Vital Sign    Days of Exercise per Week: 5 days    Minutes of Exercise per Session: 30 min  Stress: No Stress Concern Present (08/28/2024)   [employer] of Occupational Health - Occupational Stress Questionnaire    Feeling of Stress: Not at all  Social Connections: Moderately Integrated (08/28/2024)   Social Connection and Isolation Panel    Frequency of Communication with Friends and Family: Three times a week    Frequency of Social Gatherings with Friends and Family: Once a week    Attends Religious Services: 1 to 4 times per year  Active Member of Clubs or Organizations: No    Attends Banker Meetings: Never    Marital Status: Married    Tobacco Counseling Counseling given: Not Answered Tobacco comments: quit in 1985   Clinical Intake: No major concerns or questions today     Pain : No/denies pain     BMI - recorded: 32.05 Nutritional Status: BMI > 30  Obese Nutritional Risks: None  How often do you need to have someone help you when you read instructions, pamphlets, or other written materials from your doctor or pharmacy?: 1 - Never         Activities of Daily Living    08/28/2024    2:38 PM  In your present state of health, do you have any difficulty performing the following activities:  Hearing? 0  Vision? 0  Difficulty concentrating or making decisions? 0  Walking or climbing stairs? 0  Dressing or bathing? 0  Doing errands, shopping? 0  Preparing Food and eating ? N  Using the Toilet? N  In the past six months, have you accidently leaked urine? N  Do you have problems with loss of bowel control? N  Managing your Medications? N  Managing your Finances? N  Housekeeping or managing your Housekeeping? N    Patient Care Team: Milon Cleaves, [place] as PCP - General (Physician Assistant) Ezzard Valaria LABOR, MD as Consulting Physician (Oncology) Jomarie Agent, MD as Consulting  Physician (Radiation Oncology) Dillingham, Estefana RAMAN, DO as Attending Physician (Plastic Surgery)  Indicate any recent Medical Services you may have received from other than [facility] providers in the past year (date may be approximate).     Assessment:   This is a routine wellness examination for Thomas Gonzalez.  Hearing/Vision screen No results found.   Goals Addressed             This Visit's Progress    Activity and Exercise Increased   On track    Evidence-based guidance:  Review current exercise levels.  Assess patient perspective on exercise or activity level, barriers to increasing activity, motivation and readiness for change.  Recommend or set healthy exercise goal based on individual tolerance.  Encourage small steps toward making change in amount of exercise or activity.  Urge reduction of sedentary activities or screen time.  Promote group activities within the community or with family or support person.  Consider referral to rehabiliation therapist for assessment and exercise/activity plan.   Notes: Continue to remain cancer free       Depression Screen    08/28/2024    2:35 PM 06/11/2023    1:56 PM 07/06/2020   10:03 AM  PHQ 2/9 Scores  PHQ - 2 Score 0 0 0    Fall Risk    08/28/2024    2:38 PM 10/09/2023   10:47 AM 06/11/2023    1:56 PM 07/14/2020    1:24 PM  Fall Risk   Falls in the past year? 0 0 0 0  Number falls in past yr: 0 0 0 0  Injury with Fall? 0 0 0 0  Risk for fall due to : No Fall Risks No Fall Risks No Fall Risks   Follow up Falls evaluation completed Falls evaluation completed Falls evaluation completed Falls evaluation completed      Data saved with a previous flowsheet row definition    MEDICARE RISK AT HOME: Medicare Risk at Home Any stairs in or around the home?: No If so, are there any without handrails?:  No Home free of loose throw rugs in walkways, pet beds, electrical cords, etc?: Yes Adequate lighting in your home to reduce risk of  falls?: Yes Life alert?: No Use of a cane, walker or w/c?: No Grab bars in the bathroom?: No Shower chair or bench in shower?: No Elevated toilet seat or a handicapped toilet?: No  TIMED UP AND GO:  Was the test performed?  Yes  Length of time to ambulate 10 feet: 15 sec Gait steady and fast without use of assistive device    Cognitive Function:        08/28/2024    2:22 PM  6CIT Screen  What Year? 0 points  What month? 0 points  What time? 0 points  Count back from 20 0 points  Months in reverse 0 points  Repeat phrase 0 points  Total Score 0 points    Immunizations  There is no immunization history on file for this patient.   Screening Tests Health Maintenance  Topic Date Due   COVID-19 Vaccine (1) Never done   HIV Screening  Never done   Hepatitis C Screening  Never done   DTaP/Tdap/Td (1 - Tdap) 10/08/2024 (Originally 05/06/1985)   Zoster Vaccines- Shingrix (1 of 2) 11/28/2024 (Originally 05/06/1985)   Influenza Vaccine  02/17/2025 (Originally 06/20/2024)   Pneumococcal Vaccine: 50+ Years (1 of 1 - PCV) 08/28/2025 (Originally 05/05/2014)   Medicare Annual Wellness (AWV)  08/28/2025   Colonoscopy  05/18/2030   Hepatitis B Vaccines 19-59 Average Risk  Aged Out   HPV VACCINES  Aged Out   Meningococcal B Vaccine  Aged Out    Health Maintenance  Health Maintenance Due  Topic Date Due   COVID-19 Vaccine (1) Never done   HIV Screening  Never done   Hepatitis C Screening  Never done       Additional Screening:    Vision Screening: Recommended annual ophthalmology exams for early detection of glaucoma and other disorders of the eye. Is the patient up to date with their annual eye exam?  No  Who is the provider or what is the name of the office in which the patient attends annual eye exams? N/A If pt is not established with a provider, would they like to be referred to a provider to establish care? No .   Dental Screening: Recommended annual dental exams  for proper oral hygiene      Plan:     I have personally reviewed and noted the following in the patient's chart:   Medical and social history Use of alcohol, tobacco or illicit drugs  Current medications and supplements including opioid prescriptions. Patient is not currently taking opioid prescriptions. Functional ability and status Nutritional status Physical activity Advanced directives List of other physicians Hospitalizations, surgeries, and ER visits in previous 12 months Vitals Screenings to include cognitive, depression, and falls Referrals and appointments  In addition, I have reviewed and discussed with patient certain preventive protocols, quality metrics, and best practice recommendations. A written personalized care plan for preventive services as well as general preventive health recommendations were provided to patient.      After Visit Summary: (In Person-Declined) Patient declined AVS at this time.

## 2024-09-09 ENCOUNTER — Other Ambulatory Visit: Payer: Self-pay

## 2024-09-09 DIAGNOSIS — C159 Malignant neoplasm of esophagus, unspecified: Secondary | ICD-10-CM

## 2024-09-09 NOTE — Addendum Note (Signed)
 Addended by: PERLEY ALAN SAILOR on: 09/09/2024 10:56 AM   Modules accepted: Orders

## 2024-10-26 ENCOUNTER — Encounter (HOSPITAL_BASED_OUTPATIENT_CLINIC_OR_DEPARTMENT_OTHER): Payer: Self-pay

## 2024-10-26 ENCOUNTER — Encounter (HOSPITAL_BASED_OUTPATIENT_CLINIC_OR_DEPARTMENT_OTHER): Payer: Self-pay | Admitting: Radiology

## 2024-10-30 ENCOUNTER — Inpatient Hospital Stay (INDEPENDENT_AMBULATORY_CARE_PROVIDER_SITE_OTHER)
Admission: RE | Admit: 2024-10-30 | Discharge: 2024-10-30 | Disposition: A | Source: Ambulatory Visit | Attending: Oncology | Admitting: Oncology

## 2024-10-30 DIAGNOSIS — C159 Malignant neoplasm of esophagus, unspecified: Secondary | ICD-10-CM

## 2024-10-30 MED ORDER — IOHEXOL 300 MG/ML  SOLN
100.0000 mL | Freq: Once | INTRAMUSCULAR | Status: AC | PRN
  Administered 2024-10-30: 100 mL via INTRAVENOUS

## 2024-11-04 NOTE — Progress Notes (Unsigned)
 [facility]  [address] [place],  [place]  [address] [phone]  Clinic Day:  05/06/2024  Referring physician: Milon Cleaves, PA  HISTORY OF PRESENT ILLNESS:  The patient is a 58 y.o. 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  He comes in today for routine follow-up, as well as to review his most recent CT scans.  Since his last visit, the patient has been doing well.   He denies having any dysphagia, abdominal discomfort or other GI symptoms which concern him for disease recurrence.  With respect to his disease management, he completed neoadjuvant chemoradiation, which consisted of 3 cycles of FOLFOX chemotherapy.  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  Pathology from this surgery showed persistent adenocarcinoma which extended into the muscularis propria level of his stomach.  Based upon there being evidence of residual disease, he underwent adjuvant nivolumab  immunotherapy for 1 year, which was completed in November 2022.  Scans since then have shown no evidence of disease recurrence.       PHYSICAL EXAM:  There were no vitals taken for this visit. Wt Readings from Last 3 Encounters:  08/28/24 217 lb (98.4 kg)  05/06/24 212 lb 4.8 oz (96.3 kg)  02/21/24 207 lb (93.9 kg)   There is no height or weight on file to calculate BMI. Performance status (ECOG): 1 - Symptomatic but completely ambulatory Physical Exam Constitutional:      General: He is not in acute distress.    Appearance: Normal appearance. He is normal weight.  HENT:     Head: Normocephalic and atraumatic.  Eyes:     General: No scleral icterus.    Extraocular Movements: Extraocular movements intact.     Conjunctiva/sclera: Conjunctivae normal.     Pupils: Pupils are equal, round, and reactive to light.  Cardiovascular:     Rate and Rhythm: Normal rate and regular rhythm.     Pulses: Normal pulses.     Heart  sounds: Normal heart sounds. No murmur heard.    No friction rub. No gallop.  Pulmonary:     Effort: Pulmonary effort is normal. No respiratory distress.     Breath sounds: Normal breath sounds.  Abdominal:     General: Bowel sounds are normal. There is no distension.     Palpations: Abdomen is soft. There is no hepatomegaly, splenomegaly or mass.     Tenderness: There is no abdominal tenderness.  Musculoskeletal:        General: Normal range of motion.     Cervical back: Normal range of motion and neck supple.     Right lower leg: No edema.     Left lower leg: No edema.  Lymphadenopathy:     Cervical: No cervical adenopathy.  Skin:    General: Skin is warm and dry.  Neurological:     General: No focal deficit present.     Mental Status: He is alert and oriented to person, place, and time. Mental status is at baseline.  Psychiatric:        Mood and Affect: Mood normal.        Behavior: Behavior normal.        Thought Content: Thought content normal.        Judgment: Judgment normal.   SCANS:  CT scans of his chest/abdomen/pelvis on 04-17-24 revealed the following: FINDINGS: CT CHEST FINDINGS   Cardiovascular: Right chest Port-A-Cath with the distal tip in the  superior cavoatrial junction. No significant vascular findings. Normal heart size. No pericardial effusion.   Mediastinum/Nodes: Prior esophagectomy with gastric pull-through. No focal abnormality at the suture margins. No pathologically enlarged mediastinal, hilar or axillary lymph nodes. Normal thyroid  gland and trachea.   Lungs/Pleura: Lungs are clear. Stable 5 mm right upper lobe nodule (64:302). No pleural effusion or pneumothorax. No new or suspicious pulmonary nodule.   Musculoskeletal: No chest wall mass or suspicious bone lesions identified. Mild multilevel degenerative changes of the thoracic spine.   CT ABDOMEN PELVIS FINDINGS   Hepatobiliary: No focal liver abnormality is seen. No  gallstones, gallbladder wall thickening, or biliary dilatation.   Pancreas: Unremarkable. No pancreatic ductal dilatation or surrounding inflammatory changes.   Spleen: Normal in size without focal abnormality.   Adrenals/Urinary Tract: Adrenal glands are unremarkable. Kidneys are normal, without renal calculi, focal lesion, or hydronephrosis. Bladder is unremarkable.   Stomach/Bowel: Prior gastric pull-through. Normal small bowel without obstruction. Colonic diverticulosis without pericolonic inflammation. Appendix not visualized.   Vascular/Lymphatic: Aortic atherosclerosis. No enlarged abdominal or pelvic lymph nodes.   Reproductive: Mild prostatomegaly with dystrophic calcifications.   Other: Prior left inguinal hernia repair. No abdominopelvic ascites.   Musculoskeletal: No acute or significant osseous findings. Multilevel degenerative changes.   IMPRESSION: 1. Prior esophagectomy with gastric pull-through. 2. No evidence of local recurrence or metastatic disease in the chest, abdomen or pelvis.  ASSESSMENT & PLAN:  Assessment/Plan:  A 58 y.o. male with stage IVA (T3 N2 M0) adenocarcinoma of his gastroesophageal junction, status post neoadjuvant chemotherapy, followed by an esophagectomy in November 2021, and 1 year of adjuvant nivolumab  immunotherapy.  In clinic today, I went over all of his CT scan images with him, for which he could see there remains no radiographic evidence of disease recurrence.  Understandably, the patient was very pleased with his CT scan results.  Clinically, he continues to do very well.  As that is the case, I will see him back in 6 months for repeat clinical assessment.  The patient understands all the plans discussed today and is in agreement with them.    Naheem Mosco DELENA Kerns, MD

## 2024-11-05 ENCOUNTER — Inpatient Hospital Stay

## 2024-11-05 ENCOUNTER — Other Ambulatory Visit: Payer: Self-pay | Admitting: Oncology

## 2024-11-05 ENCOUNTER — Telehealth: Payer: Self-pay | Admitting: Oncology

## 2024-11-05 ENCOUNTER — Inpatient Hospital Stay: Attending: Oncology | Admitting: Oncology

## 2024-11-05 VITALS — BP 134/87 | HR 68 | Temp 98.4°F | Resp 16 | Ht 69.0 in | Wt 217.8 lb

## 2024-11-05 DIAGNOSIS — C159 Malignant neoplasm of esophagus, unspecified: Secondary | ICD-10-CM

## 2024-11-05 DIAGNOSIS — Z8501 Personal history of malignant neoplasm of esophagus: Secondary | ICD-10-CM | POA: Diagnosis present

## 2024-11-05 DIAGNOSIS — Z9221 Personal history of antineoplastic chemotherapy: Secondary | ICD-10-CM | POA: Diagnosis not present

## 2024-11-05 LAB — CMP (CANCER CENTER ONLY)
ALT: 16 U/L (ref 0–44)
AST: 21 U/L (ref 15–41)
Albumin: 4.4 g/dL (ref 3.5–5.0)
Alkaline Phosphatase: 63 U/L (ref 38–126)
Anion gap: 9 (ref 5–15)
BUN: 21 mg/dL — ABNORMAL HIGH (ref 6–20)
CO2: 28 mmol/L (ref 22–32)
Calcium: 9.7 mg/dL (ref 8.9–10.3)
Chloride: 103 mmol/L (ref 98–111)
Creatinine: 0.94 mg/dL (ref 0.61–1.24)
GFR, Estimated: >60 mL/min (ref >60)
Glucose, Bld: 80 mg/dL (ref 70–99)
Potassium: 4.6 mmol/L (ref 3.5–5.1)
Sodium: 140 mmol/L (ref 135–145)
Total Bilirubin: 0.7 mg/dL (ref 0.0–1.2)
Total Protein: 6.9 g/dL (ref 6.5–8.1)

## 2024-11-05 LAB — CBC WITH DIFFERENTIAL (CANCER CENTER ONLY)
Abs Immature Granulocytes: 0.05 K/uL (ref 0.00–0.07)
Basophils Absolute: 0 K/uL (ref 0.0–0.1)
Basophils Relative: 1 %
Eosinophils Absolute: 0.2 K/uL (ref 0.0–0.5)
Eosinophils Relative: 3 %
HCT: 49.9 % (ref 39.0–52.0)
Hemoglobin: 16.8 g/dL (ref 13.0–17.0)
Immature Granulocytes: 1 %
Lymphocytes Relative: 21 %
Lymphs Abs: 1.5 K/uL (ref 0.7–4.0)
MCH: 32.2 pg (ref 26.0–34.0)
MCHC: 33.7 g/dL (ref 30.0–36.0)
MCV: 95.8 fL (ref 80.0–100.0)
Monocytes Absolute: 0.8 K/uL (ref 0.1–1.0)
Monocytes Relative: 12 %
Neutro Abs: 4.4 K/uL (ref 1.7–7.7)
Neutrophils Relative %: 62 %
Platelet Count: 212 K/uL (ref 150–400)
RBC: 5.21 MIL/uL (ref 4.22–5.81)
RDW: 12.5 % (ref 11.5–15.5)
WBC Count: 7 K/uL (ref 4.0–10.5)
nRBC: 0 % (ref 0.0–0.2)

## 2024-11-05 NOTE — Telephone Encounter (Signed)
 Patient has been scheduled for follow-up visit per 11/05/2024 LOS.  Pt given an appt calendar with date and time.

## 2025-01-01 ENCOUNTER — Ambulatory Visit: Admitting: Physician Assistant

## 2025-05-07 ENCOUNTER — Inpatient Hospital Stay

## 2025-05-07 ENCOUNTER — Inpatient Hospital Stay: Admitting: Oncology

## 2025-08-31 ENCOUNTER — Ambulatory Visit: Admitting: Physician Assistant
